# Patient Record
Sex: Male | Born: 1945 | Race: White | Hispanic: No | Marital: Married | Smoking: Former smoker
Health system: Southern US, Community
[De-identification: ages and names within clinical notes are randomized; demographics above are authoritative.]

## PROBLEM LIST (undated history)

## (undated) DIAGNOSIS — L408 Other psoriasis: Secondary | ICD-10-CM

## (undated) DIAGNOSIS — E1149 Type 2 diabetes mellitus with other diabetic neurological complication: Secondary | ICD-10-CM

## (undated) DIAGNOSIS — I2510 Atherosclerotic heart disease of native coronary artery without angina pectoris: Secondary | ICD-10-CM

## (undated) DIAGNOSIS — Z86010 Personal history of colonic polyps: Secondary | ICD-10-CM

## (undated) DIAGNOSIS — I10 Essential (primary) hypertension: Secondary | ICD-10-CM

## (undated) DIAGNOSIS — E119 Type 2 diabetes mellitus without complications: Secondary | ICD-10-CM

## (undated) DIAGNOSIS — K3189 Other diseases of stomach and duodenum: Secondary | ICD-10-CM

## (undated) DIAGNOSIS — E669 Obesity, unspecified: Secondary | ICD-10-CM

## (undated) DIAGNOSIS — I4891 Unspecified atrial fibrillation: Secondary | ICD-10-CM

## (undated) DIAGNOSIS — E785 Hyperlipidemia, unspecified: Secondary | ICD-10-CM

## (undated) DIAGNOSIS — N189 Chronic kidney disease, unspecified: Secondary | ICD-10-CM

## (undated) DIAGNOSIS — J449 Chronic obstructive pulmonary disease, unspecified: Secondary | ICD-10-CM

## (undated) DIAGNOSIS — R1013 Epigastric pain: Secondary | ICD-10-CM

## (undated) DIAGNOSIS — F1021 Alcohol dependence, in remission: Secondary | ICD-10-CM

## (undated) DIAGNOSIS — I5022 Chronic systolic (congestive) heart failure: Secondary | ICD-10-CM

## (undated) DIAGNOSIS — I4892 Unspecified atrial flutter: Secondary | ICD-10-CM

## (undated) DIAGNOSIS — H6060 Unspecified chronic otitis externa, unspecified ear: Secondary | ICD-10-CM

## (undated) HISTORY — PX: HERNIA REPAIR: SHX51

## (undated) HISTORY — DX: Unspecified chronic otitis externa, unspecified ear: H60.60

## (undated) HISTORY — DX: Epigastric pain: R10.13

## (undated) HISTORY — DX: Type 2 diabetes mellitus with other diabetic neurological complication: E11.49

## (undated) HISTORY — PX: ANGIOPLASTY: SHX39

## (undated) HISTORY — DX: Chronic systolic (congestive) heart failure: I50.22

## (undated) HISTORY — DX: Other psoriasis: L40.8

## (undated) HISTORY — PX: CARDIAC DEFIBRILLATOR PLACEMENT: SHX171

## (undated) HISTORY — DX: Type 2 diabetes mellitus without complications: E11.9

## (undated) HISTORY — DX: Other diseases of stomach and duodenum: K31.89

## (undated) HISTORY — DX: Hyperlipidemia, unspecified: E78.5

## (undated) HISTORY — PX: CORONARY ANGIOPLASTY: SHX604

## (undated) HISTORY — DX: Atherosclerotic heart disease of native coronary artery without angina pectoris: I25.10

## (undated) HISTORY — DX: Essential (primary) hypertension: I10

## (undated) HISTORY — DX: Obesity, unspecified: E66.9

## (undated) HISTORY — PX: TRANSTHORACIC ECHOCARDIOGRAM: SHX275

## (undated) HISTORY — DX: Chronic obstructive pulmonary disease, unspecified: J44.9

## (undated) HISTORY — DX: Chronic kidney disease, unspecified: N18.9

## (undated) HISTORY — DX: Unspecified atrial flutter: I48.92

## (undated) HISTORY — DX: Alcohol dependence, in remission: F10.21

## (undated) HISTORY — DX: Personal history of colonic polyps: Z86.010

## (undated) HISTORY — DX: Unspecified atrial fibrillation: I48.91

## (undated) HISTORY — PX: CARDIAC CATHETERIZATION: SHX172

---

## 1997-10-17 ENCOUNTER — Other Ambulatory Visit: Admission: RE | Admit: 1997-10-17 | Discharge: 1997-10-17 | Payer: Self-pay | Admitting: Orthopedic Surgery

## 2000-08-26 ENCOUNTER — Encounter: Payer: Self-pay | Admitting: *Deleted

## 2000-08-26 ENCOUNTER — Ambulatory Visit (HOSPITAL_COMMUNITY): Admission: RE | Admit: 2000-08-26 | Discharge: 2000-08-26 | Payer: Self-pay | Admitting: *Deleted

## 2003-10-16 ENCOUNTER — Inpatient Hospital Stay (HOSPITAL_COMMUNITY): Admission: AD | Admit: 2003-10-16 | Discharge: 2003-10-22 | Payer: Self-pay | Admitting: Internal Medicine

## 2003-10-17 ENCOUNTER — Encounter: Payer: Self-pay | Admitting: Cardiology

## 2004-01-25 ENCOUNTER — Ambulatory Visit: Payer: Self-pay | Admitting: Cardiology

## 2004-02-08 ENCOUNTER — Ambulatory Visit: Payer: Self-pay | Admitting: Cardiology

## 2004-02-22 ENCOUNTER — Ambulatory Visit: Payer: Self-pay | Admitting: Internal Medicine

## 2004-02-29 ENCOUNTER — Ambulatory Visit: Payer: Self-pay | Admitting: Internal Medicine

## 2004-03-28 ENCOUNTER — Ambulatory Visit: Payer: Self-pay | Admitting: Cardiology

## 2004-04-18 ENCOUNTER — Ambulatory Visit: Payer: Self-pay | Admitting: Internal Medicine

## 2004-05-16 ENCOUNTER — Ambulatory Visit: Payer: Self-pay | Admitting: Cardiovascular Disease

## 2004-05-23 ENCOUNTER — Ambulatory Visit: Payer: Self-pay | Admitting: Cardiology

## 2004-06-06 ENCOUNTER — Ambulatory Visit: Payer: Self-pay | Admitting: Internal Medicine

## 2004-06-25 ENCOUNTER — Ambulatory Visit: Payer: Self-pay | Admitting: *Deleted

## 2004-07-09 ENCOUNTER — Ambulatory Visit: Payer: Self-pay | Admitting: Internal Medicine

## 2004-07-30 ENCOUNTER — Ambulatory Visit: Payer: Self-pay | Admitting: Cardiology

## 2004-08-05 ENCOUNTER — Ambulatory Visit: Payer: Self-pay | Admitting: Cardiology

## 2004-08-20 ENCOUNTER — Ambulatory Visit: Payer: Self-pay | Admitting: Cardiology

## 2004-08-27 ENCOUNTER — Ambulatory Visit: Payer: Self-pay | Admitting: Cardiology

## 2004-08-29 ENCOUNTER — Ambulatory Visit: Payer: Self-pay | Admitting: Internal Medicine

## 2004-09-12 ENCOUNTER — Ambulatory Visit: Payer: Self-pay | Admitting: Cardiology

## 2004-09-19 ENCOUNTER — Ambulatory Visit: Payer: Self-pay | Admitting: Cardiology

## 2004-10-07 ENCOUNTER — Ambulatory Visit: Payer: Self-pay | Admitting: Cardiology

## 2004-10-28 ENCOUNTER — Ambulatory Visit: Payer: Self-pay | Admitting: Cardiology

## 2005-02-25 ENCOUNTER — Ambulatory Visit: Payer: Self-pay | Admitting: Internal Medicine

## 2005-03-09 ENCOUNTER — Inpatient Hospital Stay (HOSPITAL_COMMUNITY): Admission: EM | Admit: 2005-03-09 | Discharge: 2005-03-15 | Payer: Self-pay | Admitting: Emergency Medicine

## 2005-03-09 ENCOUNTER — Ambulatory Visit: Payer: Self-pay | Admitting: *Deleted

## 2005-03-10 ENCOUNTER — Encounter: Payer: Self-pay | Admitting: Cardiology

## 2005-03-12 ENCOUNTER — Encounter: Payer: Self-pay | Admitting: Internal Medicine

## 2005-03-18 ENCOUNTER — Ambulatory Visit: Payer: Self-pay | Admitting: Cardiovascular Disease

## 2005-03-25 ENCOUNTER — Ambulatory Visit: Payer: Self-pay | Admitting: Internal Medicine

## 2005-03-26 ENCOUNTER — Ambulatory Visit: Payer: Self-pay | Admitting: Cardiology

## 2005-04-03 ENCOUNTER — Ambulatory Visit: Payer: Self-pay | Admitting: Internal Medicine

## 2005-04-03 ENCOUNTER — Ambulatory Visit: Payer: Self-pay

## 2005-04-10 ENCOUNTER — Ambulatory Visit: Payer: Self-pay | Admitting: Internal Medicine

## 2005-04-17 ENCOUNTER — Ambulatory Visit: Payer: Self-pay | Admitting: Cardiology

## 2005-04-24 ENCOUNTER — Ambulatory Visit: Payer: Self-pay | Admitting: Cardiology

## 2005-05-01 ENCOUNTER — Ambulatory Visit: Payer: Self-pay | Admitting: Cardiovascular Disease

## 2005-05-08 ENCOUNTER — Ambulatory Visit: Payer: Self-pay | Admitting: Internal Medicine

## 2005-05-15 ENCOUNTER — Ambulatory Visit: Payer: Self-pay | Admitting: Cardiology

## 2005-05-15 ENCOUNTER — Ambulatory Visit: Payer: Self-pay | Admitting: Internal Medicine

## 2005-05-22 ENCOUNTER — Ambulatory Visit: Payer: Self-pay | Admitting: Cardiology

## 2005-05-25 ENCOUNTER — Ambulatory Visit: Payer: Self-pay | Admitting: Internal Medicine

## 2005-05-25 ENCOUNTER — Ambulatory Visit: Payer: Self-pay | Admitting: Cardiology

## 2005-06-02 ENCOUNTER — Ambulatory Visit: Payer: Self-pay | Admitting: Cardiology

## 2005-06-09 ENCOUNTER — Ambulatory Visit: Payer: Self-pay | Admitting: Cardiovascular Disease

## 2005-06-12 ENCOUNTER — Inpatient Hospital Stay (HOSPITAL_COMMUNITY): Admission: EM | Admit: 2005-06-12 | Discharge: 2005-06-16 | Payer: Self-pay | Admitting: Emergency Medicine

## 2005-06-12 ENCOUNTER — Ambulatory Visit: Payer: Self-pay | Admitting: Internal Medicine

## 2005-06-22 ENCOUNTER — Ambulatory Visit: Payer: Self-pay | Admitting: Cardiology

## 2005-06-23 ENCOUNTER — Ambulatory Visit: Payer: Self-pay | Admitting: Internal Medicine

## 2005-06-25 ENCOUNTER — Ambulatory Visit: Payer: Self-pay | Admitting: Internal Medicine

## 2005-06-30 ENCOUNTER — Ambulatory Visit: Payer: Self-pay

## 2005-07-08 ENCOUNTER — Ambulatory Visit: Payer: Self-pay | Admitting: *Deleted

## 2005-07-09 ENCOUNTER — Ambulatory Visit: Payer: Self-pay | Admitting: Internal Medicine

## 2005-07-14 ENCOUNTER — Ambulatory Visit: Payer: Self-pay | Admitting: Cardiovascular Disease

## 2005-07-16 ENCOUNTER — Ambulatory Visit: Payer: Self-pay | Admitting: Internal Medicine

## 2005-07-21 ENCOUNTER — Ambulatory Visit: Payer: Self-pay | Admitting: Cardiology

## 2005-07-24 ENCOUNTER — Ambulatory Visit: Payer: Self-pay | Admitting: Internal Medicine

## 2005-07-28 ENCOUNTER — Ambulatory Visit: Payer: Self-pay | Admitting: Cardiology

## 2005-08-04 ENCOUNTER — Ambulatory Visit: Payer: Self-pay | Admitting: Internal Medicine

## 2005-08-04 ENCOUNTER — Ambulatory Visit: Payer: Self-pay | Admitting: Cardiology

## 2005-08-05 ENCOUNTER — Ambulatory Visit (HOSPITAL_COMMUNITY): Admission: RE | Admit: 2005-08-05 | Discharge: 2005-08-06 | Payer: Self-pay | Admitting: Internal Medicine

## 2005-08-05 ENCOUNTER — Ambulatory Visit: Payer: Self-pay | Admitting: Internal Medicine

## 2005-08-25 ENCOUNTER — Ambulatory Visit: Payer: Self-pay | Admitting: Internal Medicine

## 2005-08-27 ENCOUNTER — Ambulatory Visit: Payer: Self-pay | Admitting: Internal Medicine

## 2005-11-16 ENCOUNTER — Ambulatory Visit: Payer: Self-pay | Admitting: Internal Medicine

## 2005-11-25 ENCOUNTER — Ambulatory Visit: Payer: Self-pay | Admitting: Internal Medicine

## 2006-03-26 ENCOUNTER — Ambulatory Visit: Payer: Self-pay | Admitting: Internal Medicine

## 2006-03-26 LAB — CONVERTED CEMR LAB
BUN: 24 mg/dL — ABNORMAL HIGH (ref 6–23)
Calcium: 8.9 mg/dL (ref 8.4–10.5)
Chloride: 98 meq/L (ref 96–112)
Hgb A1c MFr Bld: 12.7 % — ABNORMAL HIGH (ref 4.6–6.0)

## 2006-04-28 ENCOUNTER — Ambulatory Visit: Payer: Self-pay | Admitting: Internal Medicine

## 2006-05-27 ENCOUNTER — Ambulatory Visit: Payer: Self-pay | Admitting: Internal Medicine

## 2006-09-20 DIAGNOSIS — I2510 Atherosclerotic heart disease of native coronary artery without angina pectoris: Secondary | ICD-10-CM | POA: Insufficient documentation

## 2006-09-20 DIAGNOSIS — E119 Type 2 diabetes mellitus without complications: Secondary | ICD-10-CM

## 2006-09-20 DIAGNOSIS — I5022 Chronic systolic (congestive) heart failure: Secondary | ICD-10-CM

## 2006-09-20 DIAGNOSIS — E785 Hyperlipidemia, unspecified: Secondary | ICD-10-CM

## 2006-09-20 DIAGNOSIS — I4892 Unspecified atrial flutter: Secondary | ICD-10-CM

## 2006-09-20 DIAGNOSIS — I5032 Chronic diastolic (congestive) heart failure: Secondary | ICD-10-CM

## 2006-09-20 HISTORY — DX: Type 2 diabetes mellitus without complications: E11.9

## 2006-09-20 HISTORY — DX: Chronic systolic (congestive) heart failure: I50.22

## 2006-09-20 HISTORY — DX: Unspecified atrial flutter: I48.92

## 2006-09-20 HISTORY — DX: Hyperlipidemia, unspecified: E78.5

## 2006-09-20 HISTORY — DX: Atherosclerotic heart disease of native coronary artery without angina pectoris: I25.10

## 2006-09-30 ENCOUNTER — Encounter: Payer: Self-pay | Admitting: Internal Medicine

## 2006-09-30 ENCOUNTER — Ambulatory Visit: Payer: Self-pay | Admitting: Internal Medicine

## 2006-09-30 LAB — CONVERTED CEMR LAB
CO2: 31 meq/L (ref 19–32)
Calcium: 9.3 mg/dL (ref 8.4–10.5)
Chloride: 98 meq/L (ref 96–112)
Creatinine, Ser: 1.3 mg/dL (ref 0.4–1.5)
GFR calc non Af Amer: 60 mL/min
Glucose, Bld: 204 mg/dL — ABNORMAL HIGH (ref 70–99)

## 2006-10-09 ENCOUNTER — Ambulatory Visit: Payer: Self-pay | Admitting: *Deleted

## 2006-10-09 ENCOUNTER — Ambulatory Visit: Payer: Self-pay | Admitting: Internal Medicine

## 2006-10-09 ENCOUNTER — Ambulatory Visit: Payer: Self-pay | Admitting: Cardiology

## 2006-10-09 ENCOUNTER — Inpatient Hospital Stay (HOSPITAL_COMMUNITY): Admission: EM | Admit: 2006-10-09 | Discharge: 2006-10-15 | Payer: Self-pay | Admitting: Emergency Medicine

## 2006-10-12 ENCOUNTER — Encounter: Payer: Self-pay | Admitting: Internal Medicine

## 2006-10-18 ENCOUNTER — Ambulatory Visit: Payer: Self-pay | Admitting: Internal Medicine

## 2006-10-18 LAB — CONVERTED CEMR LAB
BUN: 33 mg/dL — ABNORMAL HIGH (ref 6–23)
Calcium: 9.3 mg/dL (ref 8.4–10.5)
Chloride: 98 meq/L (ref 96–112)
Creatinine, Ser: 1.3 mg/dL (ref 0.4–1.5)
GFR calc Af Amer: 72 mL/min
Potassium: 4 meq/L (ref 3.5–5.1)
Sodium: 138 meq/L (ref 135–145)

## 2006-10-22 ENCOUNTER — Telehealth: Payer: Self-pay | Admitting: Internal Medicine

## 2006-10-25 ENCOUNTER — Ambulatory Visit: Payer: Self-pay | Admitting: Internal Medicine

## 2006-10-27 ENCOUNTER — Ambulatory Visit: Payer: Self-pay | Admitting: Cardiology

## 2006-10-27 ENCOUNTER — Ambulatory Visit: Payer: Self-pay

## 2006-11-05 ENCOUNTER — Ambulatory Visit: Payer: Self-pay | Admitting: Cardiology

## 2006-11-05 LAB — CONVERTED CEMR LAB
BUN: 19 mg/dL (ref 6–23)
Calcium: 9 mg/dL (ref 8.4–10.5)
GFR calc Af Amer: 72 mL/min
GFR calc non Af Amer: 60 mL/min
Potassium: 3.9 meq/L (ref 3.5–5.1)
Pro B Natriuretic peptide (BNP): 503 pg/mL — ABNORMAL HIGH (ref 0.0–100.0)

## 2006-11-11 ENCOUNTER — Ambulatory Visit: Payer: Self-pay | Admitting: Internal Medicine

## 2006-11-11 LAB — CONVERTED CEMR LAB: Blood Glucose, Fingerstick: 274

## 2006-11-12 ENCOUNTER — Ambulatory Visit: Payer: Self-pay | Admitting: Cardiovascular Disease

## 2006-11-19 ENCOUNTER — Ambulatory Visit: Payer: Self-pay | Admitting: Internal Medicine

## 2006-11-24 ENCOUNTER — Ambulatory Visit: Payer: Self-pay | Admitting: Internal Medicine

## 2006-11-25 ENCOUNTER — Ambulatory Visit: Payer: Self-pay | Admitting: Internal Medicine

## 2006-12-02 ENCOUNTER — Ambulatory Visit: Payer: Self-pay | Admitting: Internal Medicine

## 2006-12-02 ENCOUNTER — Ambulatory Visit: Payer: Self-pay | Admitting: Cardiology

## 2006-12-02 LAB — CONVERTED CEMR LAB
BUN: 27 mg/dL — ABNORMAL HIGH (ref 6–23)
Basophils Absolute: 0 10*3/uL (ref 0.0–0.1)
Calcium: 8.9 mg/dL (ref 8.4–10.5)
Chloride: 98 meq/L (ref 96–112)
Eosinophils Absolute: 0.3 10*3/uL (ref 0.0–0.6)
GFR calc non Af Amer: 60 mL/min
INR: 2.4 — ABNORMAL HIGH (ref 0.8–1.0)
MCHC: 34.3 g/dL (ref 30.0–36.0)
MCV: 87.4 fL (ref 78.0–100.0)
Magnesium: 2.1 mg/dL (ref 1.5–2.5)
Monocytes Relative: 11.9 % — ABNORMAL HIGH (ref 3.0–11.0)
Neutrophils Relative %: 69.1 % (ref 43.0–77.0)
Platelets: 226 10*3/uL (ref 150–400)
RBC: 5.01 M/uL (ref 4.22–5.81)
Sodium: 136 meq/L (ref 135–145)
aPTT: 48 s — ABNORMAL HIGH (ref 21.7–29.8)

## 2006-12-09 ENCOUNTER — Ambulatory Visit: Payer: Self-pay | Admitting: Internal Medicine

## 2006-12-10 ENCOUNTER — Ambulatory Visit: Payer: Self-pay | Admitting: Internal Medicine

## 2006-12-10 ENCOUNTER — Ambulatory Visit (HOSPITAL_COMMUNITY): Admission: RE | Admit: 2006-12-10 | Discharge: 2006-12-10 | Payer: Self-pay | Admitting: Internal Medicine

## 2006-12-16 ENCOUNTER — Ambulatory Visit: Payer: Self-pay | Admitting: Cardiology

## 2006-12-16 ENCOUNTER — Ambulatory Visit: Payer: Self-pay | Admitting: Internal Medicine

## 2006-12-17 ENCOUNTER — Ambulatory Visit: Payer: Self-pay | Admitting: Internal Medicine

## 2006-12-17 DIAGNOSIS — J4489 Other specified chronic obstructive pulmonary disease: Secondary | ICD-10-CM

## 2006-12-17 DIAGNOSIS — I4891 Unspecified atrial fibrillation: Secondary | ICD-10-CM | POA: Insufficient documentation

## 2006-12-17 DIAGNOSIS — J449 Chronic obstructive pulmonary disease, unspecified: Secondary | ICD-10-CM

## 2006-12-17 DIAGNOSIS — I10 Essential (primary) hypertension: Secondary | ICD-10-CM

## 2006-12-17 HISTORY — DX: Unspecified atrial fibrillation: I48.91

## 2006-12-17 HISTORY — DX: Essential (primary) hypertension: I10

## 2006-12-17 HISTORY — DX: Chronic obstructive pulmonary disease, unspecified: J44.9

## 2006-12-17 HISTORY — DX: Other specified chronic obstructive pulmonary disease: J44.89

## 2006-12-23 ENCOUNTER — Ambulatory Visit: Payer: Self-pay | Admitting: Cardiology

## 2006-12-28 ENCOUNTER — Ambulatory Visit: Payer: Self-pay | Admitting: Internal Medicine

## 2006-12-28 DIAGNOSIS — H6060 Unspecified chronic otitis externa, unspecified ear: Secondary | ICD-10-CM

## 2006-12-28 DIAGNOSIS — H608X9 Other otitis externa, unspecified ear: Secondary | ICD-10-CM

## 2006-12-28 HISTORY — DX: Other otitis externa, unspecified ear: H60.8X9

## 2006-12-28 LAB — CONVERTED CEMR LAB: Blood Glucose, Fingerstick: 293

## 2006-12-30 ENCOUNTER — Ambulatory Visit: Payer: Self-pay | Admitting: Cardiology

## 2007-01-05 ENCOUNTER — Ambulatory Visit: Payer: Self-pay | Admitting: Cardiology

## 2007-01-05 LAB — CONVERTED CEMR LAB
CO2: 34 meq/L — ABNORMAL HIGH (ref 19–32)
Calcium: 9.2 mg/dL (ref 8.4–10.5)
GFR calc Af Amer: 61 mL/min
Glucose, Bld: 141 mg/dL — ABNORMAL HIGH (ref 70–99)

## 2007-01-11 ENCOUNTER — Ambulatory Visit: Payer: Self-pay | Admitting: Cardiology

## 2007-01-11 ENCOUNTER — Ambulatory Visit: Payer: Self-pay | Admitting: Internal Medicine

## 2007-01-13 ENCOUNTER — Ambulatory Visit: Payer: Self-pay | Admitting: Cardiovascular Disease

## 2007-01-18 ENCOUNTER — Ambulatory Visit: Payer: Self-pay | Admitting: Cardiology

## 2007-02-01 ENCOUNTER — Ambulatory Visit: Payer: Self-pay | Admitting: Cardiovascular Disease

## 2007-03-01 ENCOUNTER — Ambulatory Visit: Payer: Self-pay | Admitting: Cardiology

## 2007-03-02 ENCOUNTER — Ambulatory Visit: Payer: Self-pay | Admitting: Internal Medicine

## 2007-03-11 ENCOUNTER — Ambulatory Visit: Payer: Self-pay | Admitting: Cardiology

## 2007-04-06 ENCOUNTER — Emergency Department (HOSPITAL_COMMUNITY): Admission: EM | Admit: 2007-04-06 | Discharge: 2007-04-06 | Payer: Self-pay | Admitting: Emergency Medicine

## 2007-04-14 ENCOUNTER — Encounter: Payer: Self-pay | Admitting: Internal Medicine

## 2007-04-14 ENCOUNTER — Ambulatory Visit: Payer: Self-pay | Admitting: Cardiology

## 2007-04-16 ENCOUNTER — Ambulatory Visit (HOSPITAL_COMMUNITY): Admission: RE | Admit: 2007-04-16 | Discharge: 2007-04-16 | Payer: Self-pay | Admitting: Orthopedic Surgery

## 2007-04-24 ENCOUNTER — Encounter: Admission: RE | Admit: 2007-04-24 | Discharge: 2007-04-24 | Payer: Self-pay | Admitting: Orthopedic Surgery

## 2007-04-29 ENCOUNTER — Ambulatory Visit: Payer: Self-pay | Admitting: Cardiology

## 2007-05-02 ENCOUNTER — Ambulatory Visit: Payer: Self-pay | Admitting: Internal Medicine

## 2007-05-24 ENCOUNTER — Encounter: Admission: RE | Admit: 2007-05-24 | Discharge: 2007-07-13 | Payer: Self-pay | Admitting: Orthopedic Surgery

## 2007-06-09 ENCOUNTER — Ambulatory Visit: Payer: Self-pay | Admitting: Cardiology

## 2007-06-20 ENCOUNTER — Ambulatory Visit: Payer: Self-pay | Admitting: Cardiology

## 2007-07-07 ENCOUNTER — Ambulatory Visit: Payer: Self-pay | Admitting: Cardiology

## 2007-07-18 ENCOUNTER — Ambulatory Visit: Payer: Self-pay | Admitting: Cardiovascular Disease

## 2007-07-21 ENCOUNTER — Ambulatory Visit: Payer: Self-pay | Admitting: Internal Medicine

## 2007-07-25 LAB — CONVERTED CEMR LAB: Hgb A1c MFr Bld: 17.4 % — ABNORMAL HIGH (ref 4.6–6.0)

## 2007-08-08 ENCOUNTER — Telehealth: Payer: Self-pay | Admitting: Internal Medicine

## 2007-08-09 ENCOUNTER — Encounter: Payer: Self-pay | Admitting: Internal Medicine

## 2007-08-19 ENCOUNTER — Ambulatory Visit: Payer: Self-pay | Admitting: Internal Medicine

## 2007-08-19 DIAGNOSIS — M79609 Pain in unspecified limb: Secondary | ICD-10-CM | POA: Insufficient documentation

## 2007-08-19 LAB — CONVERTED CEMR LAB: Prothrombin Time: 15.1 s

## 2007-08-22 LAB — CONVERTED CEMR LAB: Hgb A1c MFr Bld: 14.7 % — ABNORMAL HIGH (ref 4.6–6.0)

## 2007-08-29 ENCOUNTER — Ambulatory Visit: Payer: Self-pay | Admitting: Internal Medicine

## 2007-11-01 ENCOUNTER — Telehealth: Payer: Self-pay | Admitting: Family Medicine

## 2007-11-07 ENCOUNTER — Ambulatory Visit: Payer: Self-pay | Admitting: Internal Medicine

## 2007-11-07 LAB — CONVERTED CEMR LAB: Blood Glucose, Fingerstick: 144

## 2007-11-29 ENCOUNTER — Ambulatory Visit: Payer: Self-pay | Admitting: Internal Medicine

## 2007-12-28 ENCOUNTER — Ambulatory Visit: Payer: Self-pay | Admitting: Internal Medicine

## 2008-01-11 ENCOUNTER — Ambulatory Visit: Payer: Self-pay | Admitting: Internal Medicine

## 2008-01-24 ENCOUNTER — Ambulatory Visit: Payer: Self-pay | Admitting: Internal Medicine

## 2008-02-23 ENCOUNTER — Encounter: Payer: Self-pay | Admitting: Internal Medicine

## 2008-02-27 ENCOUNTER — Encounter: Payer: Self-pay | Admitting: Internal Medicine

## 2008-02-28 ENCOUNTER — Telehealth: Payer: Self-pay | Admitting: Internal Medicine

## 2008-02-29 ENCOUNTER — Ambulatory Visit: Payer: Self-pay | Admitting: Internal Medicine

## 2008-03-19 ENCOUNTER — Ambulatory Visit: Payer: Self-pay | Admitting: Cardiology

## 2008-04-02 ENCOUNTER — Ambulatory Visit: Payer: Self-pay | Admitting: Cardiology

## 2008-04-30 DIAGNOSIS — L408 Other psoriasis: Secondary | ICD-10-CM

## 2008-04-30 DIAGNOSIS — F1021 Alcohol dependence, in remission: Secondary | ICD-10-CM

## 2008-04-30 DIAGNOSIS — E669 Obesity, unspecified: Secondary | ICD-10-CM

## 2008-04-30 DIAGNOSIS — N189 Chronic kidney disease, unspecified: Secondary | ICD-10-CM

## 2008-04-30 DIAGNOSIS — N259 Disorder resulting from impaired renal tubular function, unspecified: Secondary | ICD-10-CM | POA: Insufficient documentation

## 2008-04-30 HISTORY — DX: Alcohol dependence, in remission: F10.21

## 2008-04-30 HISTORY — DX: Chronic kidney disease, unspecified: N18.9

## 2008-04-30 HISTORY — DX: Obesity, unspecified: E66.9

## 2008-04-30 HISTORY — DX: Other psoriasis: L40.8

## 2008-05-02 ENCOUNTER — Emergency Department (HOSPITAL_COMMUNITY): Admission: EM | Admit: 2008-05-02 | Discharge: 2008-05-02 | Payer: Self-pay | Admitting: Family Medicine

## 2008-05-10 ENCOUNTER — Ambulatory Visit: Payer: Self-pay | Admitting: Internal Medicine

## 2008-05-10 DIAGNOSIS — M25519 Pain in unspecified shoulder: Secondary | ICD-10-CM | POA: Insufficient documentation

## 2008-05-30 ENCOUNTER — Encounter (INDEPENDENT_AMBULATORY_CARE_PROVIDER_SITE_OTHER): Payer: Self-pay | Admitting: *Deleted

## 2008-05-30 ENCOUNTER — Ambulatory Visit: Payer: Self-pay | Admitting: Internal Medicine

## 2008-05-30 LAB — CONVERTED CEMR LAB: Blood Glucose, Fingerstick: 353

## 2008-06-05 ENCOUNTER — Ambulatory Visit: Payer: Self-pay | Admitting: Internal Medicine

## 2008-06-08 ENCOUNTER — Telehealth: Payer: Self-pay | Admitting: Internal Medicine

## 2008-06-08 ENCOUNTER — Ambulatory Visit: Payer: Self-pay | Admitting: Internal Medicine

## 2008-06-12 ENCOUNTER — Telehealth: Payer: Self-pay | Admitting: Internal Medicine

## 2008-06-14 ENCOUNTER — Ambulatory Visit: Payer: Self-pay | Admitting: Internal Medicine

## 2008-06-14 LAB — CONVERTED CEMR LAB
BUN: 24 mg/dL — ABNORMAL HIGH (ref 6–23)
CO2: 32 meq/L (ref 19–32)
Chloride: 99 meq/L (ref 96–112)
Creatinine, Ser: 1.7 mg/dL — ABNORMAL HIGH (ref 0.4–1.5)

## 2008-06-25 ENCOUNTER — Telehealth (INDEPENDENT_AMBULATORY_CARE_PROVIDER_SITE_OTHER): Payer: Self-pay

## 2008-06-26 ENCOUNTER — Ambulatory Visit: Payer: Self-pay | Admitting: Cardiovascular Disease

## 2008-06-26 LAB — CONVERTED CEMR LAB
INR: 9.6 (ref 0.8–1.0)
Prothrombin Time: 91.8 s (ref 10.9–13.3)

## 2008-06-29 ENCOUNTER — Ambulatory Visit: Payer: Self-pay | Admitting: Cardiology

## 2008-07-04 ENCOUNTER — Telehealth: Payer: Self-pay | Admitting: Internal Medicine

## 2008-07-11 ENCOUNTER — Ambulatory Visit: Payer: Self-pay | Admitting: Internal Medicine

## 2008-07-16 ENCOUNTER — Telehealth: Payer: Self-pay | Admitting: Internal Medicine

## 2008-07-16 ENCOUNTER — Ambulatory Visit: Payer: Self-pay | Admitting: Internal Medicine

## 2008-07-16 LAB — CONVERTED CEMR LAB: Blood Glucose, Fingerstick: 251

## 2008-07-19 ENCOUNTER — Encounter: Payer: Self-pay | Admitting: Internal Medicine

## 2008-08-01 ENCOUNTER — Ambulatory Visit: Payer: Self-pay | Admitting: Cardiology

## 2008-08-03 ENCOUNTER — Encounter (INDEPENDENT_AMBULATORY_CARE_PROVIDER_SITE_OTHER): Payer: Self-pay | Admitting: *Deleted

## 2008-08-21 ENCOUNTER — Encounter: Payer: Self-pay | Admitting: *Deleted

## 2008-09-02 ENCOUNTER — Encounter: Payer: Self-pay | Admitting: Internal Medicine

## 2008-09-12 ENCOUNTER — Ambulatory Visit: Payer: Self-pay | Admitting: Internal Medicine

## 2008-09-12 LAB — CONVERTED CEMR LAB: POC INR: 1.8

## 2008-09-13 ENCOUNTER — Telehealth: Payer: Self-pay | Admitting: Internal Medicine

## 2008-09-26 ENCOUNTER — Encounter: Payer: Self-pay | Admitting: *Deleted

## 2008-10-02 ENCOUNTER — Ambulatory Visit: Payer: Self-pay | Admitting: Cardiology

## 2008-10-02 LAB — CONVERTED CEMR LAB
POC INR: 1.5
Prothrombin Time: 14.9 s

## 2008-10-17 ENCOUNTER — Ambulatory Visit: Payer: Self-pay | Admitting: Cardiology

## 2008-11-05 ENCOUNTER — Telehealth: Payer: Self-pay | Admitting: Internal Medicine

## 2008-11-07 ENCOUNTER — Ambulatory Visit: Payer: Self-pay | Admitting: Internal Medicine

## 2008-11-07 ENCOUNTER — Ambulatory Visit: Payer: Self-pay | Admitting: Cardiology

## 2008-11-07 LAB — CONVERTED CEMR LAB
BUN: 30 mg/dL — ABNORMAL HIGH (ref 6–23)
Chloride: 101 meq/L (ref 96–112)
Glucose, Bld: 343 mg/dL — ABNORMAL HIGH (ref 70–99)
Potassium: 3.9 meq/L (ref 3.5–5.1)

## 2008-11-14 ENCOUNTER — Ambulatory Visit: Payer: Self-pay | Admitting: Internal Medicine

## 2008-12-07 ENCOUNTER — Encounter (INDEPENDENT_AMBULATORY_CARE_PROVIDER_SITE_OTHER): Payer: Self-pay | Admitting: *Deleted

## 2008-12-19 ENCOUNTER — Encounter (INDEPENDENT_AMBULATORY_CARE_PROVIDER_SITE_OTHER): Payer: Self-pay | Admitting: *Deleted

## 2009-01-04 ENCOUNTER — Ambulatory Visit: Payer: Self-pay | Admitting: Internal Medicine

## 2009-01-04 LAB — CONVERTED CEMR LAB: POC INR: 1.3

## 2009-01-11 ENCOUNTER — Ambulatory Visit: Payer: Self-pay | Admitting: Cardiology

## 2009-01-18 ENCOUNTER — Ambulatory Visit: Payer: Self-pay | Admitting: Internal Medicine

## 2009-02-04 ENCOUNTER — Ambulatory Visit: Payer: Self-pay | Admitting: Cardiology

## 2009-02-04 LAB — CONVERTED CEMR LAB: INR: 1.8

## 2009-02-18 ENCOUNTER — Ambulatory Visit: Payer: Self-pay | Admitting: Cardiology

## 2009-02-18 LAB — CONVERTED CEMR LAB: POC INR: 2.2

## 2009-02-19 ENCOUNTER — Ambulatory Visit: Payer: Self-pay | Admitting: Internal Medicine

## 2009-02-19 DIAGNOSIS — L03019 Cellulitis of unspecified finger: Secondary | ICD-10-CM

## 2009-02-19 LAB — CONVERTED CEMR LAB
BUN: 24 mg/dL — ABNORMAL HIGH (ref 6–23)
GFR calc non Af Amer: 40.7 mL/min (ref >60)
Hgb A1c MFr Bld: 8.2 % — ABNORMAL HIGH (ref 4.6–6.5)
Potassium: 4.6 meq/L (ref 3.5–5.1)
Sodium: 141 meq/L (ref 135–145)

## 2009-03-11 ENCOUNTER — Encounter (INDEPENDENT_AMBULATORY_CARE_PROVIDER_SITE_OTHER): Payer: Self-pay | Admitting: *Deleted

## 2009-03-26 ENCOUNTER — Ambulatory Visit: Payer: Self-pay | Admitting: Cardiology

## 2009-04-04 ENCOUNTER — Ambulatory Visit: Payer: Self-pay | Admitting: Gastroenterology

## 2009-04-04 DIAGNOSIS — D68318 Other hemorrhagic disorder due to intrinsic circulating anticoagulants, antibodies, or inhibitors: Secondary | ICD-10-CM | POA: Insufficient documentation

## 2009-04-04 DIAGNOSIS — K3189 Other diseases of stomach and duodenum: Secondary | ICD-10-CM

## 2009-04-04 DIAGNOSIS — R1013 Epigastric pain: Secondary | ICD-10-CM

## 2009-04-04 HISTORY — DX: Other diseases of stomach and duodenum: K31.89

## 2009-04-16 ENCOUNTER — Ambulatory Visit: Payer: Self-pay | Admitting: Cardiovascular Disease

## 2009-04-19 ENCOUNTER — Telehealth: Payer: Self-pay | Admitting: Gastroenterology

## 2009-04-22 ENCOUNTER — Encounter: Payer: Self-pay | Admitting: Gastroenterology

## 2009-04-23 ENCOUNTER — Telehealth: Payer: Self-pay | Admitting: Gastroenterology

## 2009-04-23 ENCOUNTER — Ambulatory Visit: Payer: Self-pay | Admitting: Internal Medicine

## 2009-04-23 LAB — CONVERTED CEMR LAB
AST: 28 units/L (ref 0–37)
Alkaline Phosphatase: 70 units/L (ref 39–117)
Basophils Absolute: 0 10*3/uL (ref 0.0–0.1)
Bilirubin, Direct: 0.3 mg/dL (ref 0.0–0.3)
Calcium: 8.8 mg/dL (ref 8.4–10.5)
Creatinine, Ser: 1.9 mg/dL — ABNORMAL HIGH (ref 0.4–1.5)
GFR calc non Af Amer: 38.22 mL/min (ref >60)
Glucose, Bld: 192 mg/dL — ABNORMAL HIGH (ref 70–99)
Hemoglobin: 11.8 g/dL — ABNORMAL LOW (ref 13.0–17.0)
Lymphocytes Relative: 10.4 % — ABNORMAL LOW (ref 12.0–46.0)
Monocytes Relative: 9.5 % (ref 3.0–12.0)
Neutro Abs: 4.8 10*3/uL (ref 1.4–7.7)
Neutrophils Relative %: 71.2 % (ref 43.0–77.0)
Platelets: 187 10*3/uL (ref 150.0–400.0)
RDW: 17.7 % — ABNORMAL HIGH (ref 11.5–14.6)
Sodium: 140 meq/L (ref 135–145)
Total Bilirubin: 1.2 mg/dL (ref 0.3–1.2)

## 2009-04-24 ENCOUNTER — Telehealth: Payer: Self-pay | Admitting: Internal Medicine

## 2009-04-25 ENCOUNTER — Encounter: Payer: Self-pay | Admitting: Internal Medicine

## 2009-04-26 ENCOUNTER — Ambulatory Visit: Payer: Self-pay | Admitting: Internal Medicine

## 2009-04-29 ENCOUNTER — Telehealth: Payer: Self-pay | Admitting: Gastroenterology

## 2009-04-30 ENCOUNTER — Ambulatory Visit: Payer: Self-pay | Admitting: Gastroenterology

## 2009-04-30 ENCOUNTER — Telehealth: Payer: Self-pay | Admitting: Internal Medicine

## 2009-04-30 ENCOUNTER — Telehealth: Payer: Self-pay | Admitting: Gastroenterology

## 2009-05-02 ENCOUNTER — Ambulatory Visit: Payer: Self-pay | Admitting: Internal Medicine

## 2009-05-02 ENCOUNTER — Telehealth: Payer: Self-pay | Admitting: Gastroenterology

## 2009-05-02 ENCOUNTER — Ambulatory Visit: Payer: Self-pay | Admitting: Gastroenterology

## 2009-05-02 ENCOUNTER — Inpatient Hospital Stay (HOSPITAL_COMMUNITY): Admission: EM | Admit: 2009-05-02 | Discharge: 2009-05-16 | Payer: Self-pay | Admitting: Emergency Medicine

## 2009-05-02 ENCOUNTER — Ambulatory Visit: Payer: Self-pay | Admitting: Pulmonary Disease

## 2009-05-02 DIAGNOSIS — Z860100 Personal history of colon polyps, unspecified: Secondary | ICD-10-CM

## 2009-05-02 DIAGNOSIS — R1084 Generalized abdominal pain: Secondary | ICD-10-CM | POA: Insufficient documentation

## 2009-05-02 HISTORY — DX: Personal history of colon polyps, unspecified: Z86.0100

## 2009-05-02 HISTORY — DX: Personal history of colonic polyps: Z86.010

## 2009-05-03 ENCOUNTER — Ambulatory Visit: Payer: Self-pay | Admitting: Gastroenterology

## 2009-05-03 ENCOUNTER — Encounter (INDEPENDENT_AMBULATORY_CARE_PROVIDER_SITE_OTHER): Payer: Self-pay | Admitting: Family Medicine

## 2009-05-03 ENCOUNTER — Encounter: Payer: Self-pay | Admitting: Gastroenterology

## 2009-05-10 ENCOUNTER — Telehealth: Payer: Self-pay | Admitting: Internal Medicine

## 2009-05-10 ENCOUNTER — Encounter: Payer: Self-pay | Admitting: Emergency Medicine

## 2009-05-13 ENCOUNTER — Encounter (INDEPENDENT_AMBULATORY_CARE_PROVIDER_SITE_OTHER): Payer: Self-pay | Admitting: Internal Medicine

## 2009-05-13 ENCOUNTER — Ambulatory Visit: Payer: Self-pay | Admitting: Vascular Surgery

## 2009-05-15 ENCOUNTER — Ambulatory Visit: Payer: Self-pay | Admitting: Physical Medicine & Rehabilitation

## 2009-05-23 ENCOUNTER — Encounter: Payer: Self-pay | Admitting: Internal Medicine

## 2009-05-24 ENCOUNTER — Encounter: Payer: Self-pay | Admitting: Internal Medicine

## 2009-05-31 ENCOUNTER — Telehealth: Payer: Self-pay

## 2009-06-01 ENCOUNTER — Telehealth: Payer: Self-pay | Admitting: Family Medicine

## 2009-06-04 ENCOUNTER — Ambulatory Visit: Payer: Self-pay | Admitting: Internal Medicine

## 2009-06-04 ENCOUNTER — Encounter: Payer: Self-pay | Admitting: Cardiology

## 2009-06-04 LAB — CONVERTED CEMR LAB
BUN: 50 mg/dL — ABNORMAL HIGH (ref 6–23)
Basophils Relative: 0 % (ref 0.0–3.0)
Chloride: 99 meq/L (ref 96–112)
Creatinine, Ser: 1.9 mg/dL — ABNORMAL HIGH (ref 0.4–1.5)
Lymphs Abs: 0.8 10*3/uL (ref 0.7–4.0)
MCHC: 31.2 g/dL (ref 30.0–36.0)
MCV: 81.8 fL (ref 78.0–100.0)
Neutro Abs: 6.9 10*3/uL (ref 1.4–7.7)
Neutrophils Relative %: 79.4 % — ABNORMAL HIGH (ref 43.0–77.0)
Platelets: 205 10*3/uL (ref 150.0–400.0)
RDW: 17.4 % — ABNORMAL HIGH (ref 11.5–14.6)
WBC: 8.7 10*3/uL (ref 4.5–10.5)

## 2009-06-05 ENCOUNTER — Ambulatory Visit: Payer: Self-pay | Admitting: Internal Medicine

## 2009-06-05 ENCOUNTER — Encounter: Payer: Self-pay | Admitting: Physician Assistant

## 2009-06-06 ENCOUNTER — Telehealth: Payer: Self-pay | Admitting: Internal Medicine

## 2009-06-06 ENCOUNTER — Encounter: Payer: Self-pay | Admitting: Internal Medicine

## 2009-06-06 ENCOUNTER — Telehealth: Payer: Self-pay

## 2009-06-07 ENCOUNTER — Telehealth: Payer: Self-pay

## 2009-06-07 ENCOUNTER — Encounter: Payer: Self-pay | Admitting: Internal Medicine

## 2009-06-11 ENCOUNTER — Encounter: Payer: Self-pay | Admitting: Internal Medicine

## 2009-06-12 ENCOUNTER — Ambulatory Visit: Payer: Self-pay | Admitting: Internal Medicine

## 2009-06-12 ENCOUNTER — Encounter (INDEPENDENT_AMBULATORY_CARE_PROVIDER_SITE_OTHER): Payer: Self-pay | Admitting: Cardiology

## 2009-06-12 LAB — CONVERTED CEMR LAB
BUN: 45 mg/dL — ABNORMAL HIGH (ref 6–23)
CO2: 30 meq/L (ref 19–32)
Chloride: 100 meq/L (ref 96–112)
Creatinine, Ser: 2.2 mg/dL — ABNORMAL HIGH (ref 0.4–1.5)
Glucose, Bld: 178 mg/dL — ABNORMAL HIGH (ref 70–99)
Potassium: 3.7 meq/L (ref 3.5–5.1)

## 2009-06-19 ENCOUNTER — Ambulatory Visit: Payer: Self-pay | Admitting: Cardiovascular Disease

## 2009-06-21 ENCOUNTER — Ambulatory Visit: Payer: Self-pay | Admitting: Internal Medicine

## 2009-06-21 ENCOUNTER — Telehealth: Payer: Self-pay | Admitting: Internal Medicine

## 2009-06-26 ENCOUNTER — Ambulatory Visit: Payer: Self-pay | Admitting: Cardiology

## 2009-07-01 ENCOUNTER — Telehealth (INDEPENDENT_AMBULATORY_CARE_PROVIDER_SITE_OTHER): Payer: Self-pay | Admitting: *Deleted

## 2009-07-02 ENCOUNTER — Ambulatory Visit: Payer: Self-pay | Admitting: Internal Medicine

## 2009-07-02 LAB — CONVERTED CEMR LAB: Blood Glucose, Fingerstick: 222

## 2009-07-08 ENCOUNTER — Telehealth (INDEPENDENT_AMBULATORY_CARE_PROVIDER_SITE_OTHER): Payer: Self-pay | Admitting: *Deleted

## 2009-07-10 ENCOUNTER — Ambulatory Visit: Payer: Self-pay | Admitting: Cardiology

## 2009-07-10 LAB — CONVERTED CEMR LAB: POC INR: 1.9

## 2009-07-16 ENCOUNTER — Telehealth: Payer: Self-pay | Admitting: Internal Medicine

## 2009-07-24 ENCOUNTER — Ambulatory Visit: Payer: Self-pay | Admitting: Cardiology

## 2009-07-24 LAB — CONVERTED CEMR LAB: POC INR: 1.3

## 2009-07-25 ENCOUNTER — Encounter: Payer: Self-pay | Admitting: Internal Medicine

## 2009-07-31 ENCOUNTER — Ambulatory Visit: Payer: Self-pay | Admitting: Internal Medicine

## 2009-07-31 LAB — CONVERTED CEMR LAB: POC INR: 2.2

## 2009-08-05 ENCOUNTER — Ambulatory Visit: Payer: Self-pay | Admitting: Internal Medicine

## 2009-08-14 ENCOUNTER — Telehealth: Payer: Self-pay | Admitting: Internal Medicine

## 2009-08-16 ENCOUNTER — Encounter: Payer: Self-pay | Admitting: Internal Medicine

## 2009-08-30 ENCOUNTER — Telehealth: Payer: Self-pay | Admitting: Internal Medicine

## 2009-09-03 ENCOUNTER — Telehealth: Payer: Self-pay | Admitting: Internal Medicine

## 2009-09-05 ENCOUNTER — Telehealth: Payer: Self-pay | Admitting: Internal Medicine

## 2009-09-10 ENCOUNTER — Telehealth: Payer: Self-pay | Admitting: Internal Medicine

## 2009-09-16 ENCOUNTER — Telehealth: Payer: Self-pay | Admitting: Internal Medicine

## 2009-09-26 ENCOUNTER — Telehealth: Payer: Self-pay | Admitting: Internal Medicine

## 2009-10-10 ENCOUNTER — Ambulatory Visit: Payer: Self-pay | Admitting: Cardiology

## 2009-10-10 LAB — CONVERTED CEMR LAB: POC INR: 2.4

## 2009-11-06 ENCOUNTER — Telehealth: Payer: Self-pay | Admitting: Internal Medicine

## 2009-11-07 ENCOUNTER — Ambulatory Visit: Payer: Self-pay | Admitting: Internal Medicine

## 2009-11-11 ENCOUNTER — Ambulatory Visit: Payer: Self-pay | Admitting: Internal Medicine

## 2009-11-11 DIAGNOSIS — E1149 Type 2 diabetes mellitus with other diabetic neurological complication: Secondary | ICD-10-CM

## 2009-11-11 HISTORY — DX: Type 2 diabetes mellitus with other diabetic neurological complication: E11.49

## 2009-11-11 LAB — CONVERTED CEMR LAB
Basophils Absolute: 0.1 10*3/uL (ref 0.0–0.1)
Chloride: 94 meq/L — ABNORMAL LOW (ref 96–112)
Creatinine, Ser: 1.7 mg/dL — ABNORMAL HIGH (ref 0.4–1.5)
Eosinophils Absolute: 0.3 10*3/uL (ref 0.0–0.7)
Hemoglobin: 14.2 g/dL (ref 13.0–17.0)
Hgb A1c MFr Bld: 15.2 % — ABNORMAL HIGH (ref 4.6–6.5)
Lymphocytes Relative: 13.3 % (ref 12.0–46.0)
MCHC: 33.3 g/dL (ref 30.0–36.0)
Neutro Abs: 6.4 10*3/uL (ref 1.4–7.7)
Neutrophils Relative %: 73.3 % (ref 43.0–77.0)
Platelets: 210 10*3/uL (ref 150.0–400.0)
RDW: 17.4 % — ABNORMAL HIGH (ref 11.5–14.6)
Sodium: 137 meq/L (ref 135–145)

## 2009-12-05 ENCOUNTER — Telehealth: Payer: Self-pay | Admitting: Internal Medicine

## 2009-12-06 ENCOUNTER — Ambulatory Visit: Payer: Self-pay | Admitting: Internal Medicine

## 2009-12-06 LAB — CONVERTED CEMR LAB: POC INR: 2.7

## 2009-12-16 ENCOUNTER — Telehealth: Payer: Self-pay

## 2009-12-16 ENCOUNTER — Telehealth: Payer: Self-pay | Admitting: Internal Medicine

## 2010-01-02 ENCOUNTER — Ambulatory Visit: Payer: Self-pay | Admitting: Internal Medicine

## 2010-01-02 LAB — CONVERTED CEMR LAB: POC INR: 3

## 2010-01-07 ENCOUNTER — Telehealth: Payer: Self-pay | Admitting: Internal Medicine

## 2010-01-17 ENCOUNTER — Encounter (INDEPENDENT_AMBULATORY_CARE_PROVIDER_SITE_OTHER): Payer: Self-pay | Admitting: *Deleted

## 2010-01-30 ENCOUNTER — Ambulatory Visit: Payer: Self-pay | Admitting: Cardiology

## 2010-02-06 ENCOUNTER — Telehealth: Payer: Self-pay | Admitting: Internal Medicine

## 2010-02-20 ENCOUNTER — Ambulatory Visit: Payer: Self-pay | Admitting: Cardiovascular Disease

## 2010-02-20 LAB — CONVERTED CEMR LAB: POC INR: 3.3

## 2010-03-06 ENCOUNTER — Ambulatory Visit: Payer: Self-pay | Admitting: Internal Medicine

## 2010-03-06 LAB — CONVERTED CEMR LAB: POC INR: 4.7

## 2010-03-14 ENCOUNTER — Ambulatory Visit: Payer: Self-pay | Admitting: Cardiology

## 2010-03-14 LAB — CONVERTED CEMR LAB: POC INR: 3.1

## 2010-03-27 ENCOUNTER — Ambulatory Visit
Admission: RE | Admit: 2010-03-27 | Discharge: 2010-03-27 | Payer: Self-pay | Source: Home / Self Care | Attending: Cardiology | Admitting: Cardiology

## 2010-03-27 LAB — CONVERTED CEMR LAB: POC INR: 1.7

## 2010-04-10 ENCOUNTER — Ambulatory Visit: Admission: RE | Admit: 2010-04-10 | Discharge: 2010-04-10 | Payer: Self-pay | Source: Home / Self Care

## 2010-04-10 LAB — CONVERTED CEMR LAB: POC INR: 1.4

## 2010-04-14 ENCOUNTER — Telehealth: Payer: Self-pay

## 2010-04-24 ENCOUNTER — Ambulatory Visit: Admit: 2010-04-24 | Payer: Self-pay

## 2010-04-24 ENCOUNTER — Encounter (INDEPENDENT_AMBULATORY_CARE_PROVIDER_SITE_OTHER): Payer: Medicare Other

## 2010-04-24 ENCOUNTER — Encounter: Payer: Self-pay | Admitting: Cardiology

## 2010-04-24 DIAGNOSIS — Z7901 Long term (current) use of anticoagulants: Secondary | ICD-10-CM

## 2010-04-24 DIAGNOSIS — I4891 Unspecified atrial fibrillation: Secondary | ICD-10-CM

## 2010-04-24 NOTE — Medication Information (Signed)
Summary: ROV/AM  Anticoagulant Therapy  Managed by: Louann Sjogren, PharmD Referring MD: [name] PCP: [name], MD Supervising MD: [name] MD, Sharlot Gowda Indication 1: Atrial Fibrillation (ICD-427.31) Indication 2:  ---- Lab Used: [facility] Site: [address] INR POC 1.4 INR RANGE 2-3  Dietary changes: no    Health status changes: no    Bleeding/hemorrhagic complications: no    Recent/future hospitalizations: no    Any changes in medication regimen? yes       Details: Completed abx approx 2 weeks ago.    Recent/future dental: no  Any missed doses?: no       Is patient compliant with meds? yes       Allergies: 1)  Tetracycline Hcl (Tetracycline Hcl) 2)  Aspirin (Aspirin)  Anticoagulation Management History:      The patient is taking warfarin and comes in today for a routine follow up visit.  Positive risk factors for bleeding include presence of serious comorbidities.  Negative risk factors for bleeding include an age less than 66 years old.  The bleeding index is 'intermediate risk'.  Positive CHADS2 values include History of CHF, History of HTN, and History of Diabetes.  Negative CHADS2 values include Age > 39 years old.  The start date was 10/23/2003.  His last INR was 1.8.  Anticoagulation responsible provider: [name] MD, Sharlot Gowda.  INR POC: 1.4.  Cuvette Lot#: [id number].  Exp: 03/2011.    Anticoagulation Management Assessment/Plan:      The patient's current anticoagulation dose is Coumadin 5 mg tabs: Take as directed by coumadin clinic..  The target INR is 2.0-3.0.  The next INR is due 04/24/2010.  Anticoagulation instructions were given to patient.  Results were reviewed/authorized by Louann Sjogren, PharmD.  He was notified by Cloyde Reams RN.         Prior Anticoagulation Instructions: INR 1.7  Coumadin 5 mg tablets - Take an extra half tablet today, then take 0.5 tablet every day except 1 tablet on Thursdays.   Current Anticoagulation  Instructions: INR 1.4  Take 1.5 tablets today, then start taking 1/2 tablet daily except 1 tablet on Thursdays and Sundays.  Recheck in 2 weeks.

## 2010-04-24 NOTE — Assessment & Plan Note (Signed)
Summary: follow up from rehab/cjr   Vital Signs:  Patient profile:   65 year old male Weight:      188 pounds Temp:     98.3 degrees F oral BP sitting:   110 / 62  (right arm)  Vitals Entered By: Duard Brady LPN (June 04, 2009 3:46 PM) CC: post rehab dc - doing ok - productive cough Is Patient Diabetic? Yes CBG Result 369   Primary Care Provider:  [name], MD  CC:  post rehab dc - doing ok - productive cough.  History of Present Illness: 65 year old patient who is discharge in the hospital approximately 3 weeks ago for decompensated congestive heart failure.  He was discharged to a rehab facility for two weeks and has been home for approximately 5 days.  Remains weak, but doing quite well.  His weight remained stable on high-dose diuretic therapy.  His diabetes remains poorly controlled and he has not been able to afford mealtime insulin.  Samples were provided.  Patient assistance programs will be utilized and forms were completed.  He has gastroesophageal reflux disease and a history of chronic atrial fibrillation.  He has COPD  Allergies: 1)  Tetracycline Hcl (Tetracycline Hcl) 2)  Aspirin (Aspirin)  Past History:  Past Medical History: Reviewed history from 05/02/2009 and no changes required. 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 Bilateral Hernia sx Pilonidal cyst Defibrillator Placement Angioplasty  Review of Systems   The patient complains of weight loss, dyspnea on exertion, and prolonged cough.  The patient denies anorexia, fever, weight gain, vision loss, decreased hearing, hoarseness, chest pain, syncope, peripheral edema, headaches, hemoptysis, abdominal pain, melena, hematochezia, severe indigestion/heartburn, hematuria, incontinence, genital sores, muscle weakness, suspicious skin lesions, transient blindness, difficulty walking, depression, unusual weight change, abnormal bleeding, enlarged lymph nodes, angioedema, breast masses, and testicular masses.    Physical Exam  General:  overweight-appearing.  110/70overweight-appearing.   Head:  Normocephalic and atraumatic without obvious abnormalities. No apparent alopecia or balding. Eyes:  No corneal or conjunctival inflammation noted. EOMI. Perrla. Funduscopic exam benign, without hemorrhages, exudates or papilledema. Vision grossly normal. Mouth:  Oral mucosa and oropharynx without lesions or exudates.  Teeth in good repair. Neck:  No deformities, masses, or tenderness noted. Lungs:  scatter rhonchi; rales, left base Heart:  irregular rhythm with controlled ventricular response Abdomen:  Bowel sounds positive,abdomen soft and non-tender without masses, organomegaly or hernias noted. Msk:  No deformity or scoliosis noted of thoracic or lumbar spine.   Extremities:  no clinical edema Skin:  Intact without suspicious lesions or rashes Cervical Nodes:  No lymphadenopathy noted Axillary Nodes:  No palpable lymphadenopathy   Impression & Recommendations:  Problem # 1:  CONGESTIVE HEART FAILURE (ICD-428.00)  His updated medication list for this problem includes:    Metolazone 2.5 Mg Tabs (Metolazone) ..... Monday wednesday, and friday only    Coumadin 2.5 Mg Tabs (Warfarin sodium) ..... Use as directed per coumadin clinic    Metoprolol Succinate 50 Mg Xr24h-tab (Metoprolol succinate) ..... One daily    Metolazone 2.5 Mg Tabs (Metolazone) ..... Qd     Furosemide 80 Mg Tabs (Furosemide) ..... One twice daily  His updated medication list for this problem includes:    Metolazone 2.5 Mg Tabs (Metolazone) ..... Monday wednesday, and friday only    Coumadin 2.5 Mg Tabs (Warfarin sodium) ..... Use as directed per coumadin clinic    Metoprolol Succinate 50 Mg Xr24h-tab (Metoprolol succinate) ..... One daily    Metolazone 2.5 Mg Tabs (Metolazone) ..... Qd    Furosemide 80 Mg Tabs (Furosemide) ..... 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 Monday wednesday, and friday only    Metoprolol Succinate 50 Mg Xr24h-tab (Metoprolol succinate) ..... One daily    Metolazone 2.5 Mg Tabs (Metolazone) ..... Qd    Furosemide 80 Mg Tabs (Furosemide) ..... One twice daily  His updated medication list for this problem includes:    Metolazone 2.5 Mg Tabs (Metolazone) ..... Monday wednesday, and friday only    Metoprolol Succinate 50 Mg Xr24h-tab (Metoprolol succinate) ..... One daily    Metolazone 2.5 Mg Tabs (Metolazone) ..... Qd    Furosemide 80 Mg Tabs (Furosemide) ..... One twice daily  Problem # 4:  DIABETES MELLITUS, TYPE II (ICD-250.00)  The following medications were removed from the medication list:    Glyburide-metformin 2.5-500 Mg Tabs (Glyburide-metformin) .Marland Kitchen... 2 twice daily His updated medication list for this problem includes:    Humalog Kwikpen 100 Unit/ml Soln (Insulin lispro (human)) .Marland Kitchen... 12 units prior to every meal    Lantus Solostar 100 Unit/ml Soln (Insulin glargine) .Marland Kitchen... 20 units at bedtime  Orders: Capillary Blood Glucose/CBG (16109)  The following medications were removed from the medication list:    Glyburide-metformin 2.5-500 Mg Tabs (Glyburide-metformin) .Marland Kitchen... 2 twice daily His updated medication list for this problem includes:    Humalog Kwikpen  100 Unit/ml Soln (Insulin lispro (human)) .Marland Kitchen... 12 units prior to every meal    Lantus Solostar 100 Unit/ml Soln (Insulin glargine) .Marland Kitchen... 20 units at bedtime  Complete Medication List: 1)  Advair Diskus 250-50 Mcg/dose Misc (Fluticasone-salmeterol) .... Inhale 1 puff as directed twice a day 2)  Metolazone 2.5 Mg Tabs (Metolazone) .... Monday wednesday, and friday only 3)  Vytorin 10-40 Mg Tabs (Ezetimibe-simvastatin) .... Qd 4)  Coumadin 2.5 Mg Tabs (Warfarin sodium) .... Use as directed per coumadin clinic 5)  Klor-con M20 20 Meq Tbcr (Potassium chloride crys cr) .Marland Kitchen.. 1 two times a day 6)  Prinivil 10 Mg Tabs (lisinopril)  .... One daily 7)  Amiodarone Hcl 200 Mg Tabs (Amiodarone hcl) .... One half tablet in the morning and one half in the evening. 8)  Humalog Kwikpen 100 Unit/ml Soln (Insulin lispro (human)) .Marland Kitchen.. 12 units prior to every meal 9)  Novofine 32g X 6 Mm Misc (Insulin pen needle) .... As directed 10)  Proair Hfa 108 (90 Base) Mcg/act Aers (Albuterol sulfate) .... Two puffs every 6 hours for shortness of breath 11)  Insulin Syringe/needle 27g X 1/2" 0.5 Ml Misc (Insulin syringe-needle u-100) .... Used twice daily 12)  Metoprolol Succinate 50 Mg Xr24h-tab (Metoprolol succinate) .... One daily 13)  Multivitamins Tabs (Multiple vitamin) .Marland Kitchen.. 1 tablet by mouth once daily 14)  Aciphex 20 Mg Tbec (Rabeprazole sodium) .... Take one tab daily 15)  Lantus Solostar 100 Unit/ml Soln (Insulin glargine) .... 20 units at bedtime 16)  Spiriva Handihaler 18 Mcg Caps (Tiotropium bromide monohydrate) .... Qd 17)  Metolazone 2.5 Mg Tabs (Metolazone) .... Qd 18)  Furosemide  80 Mg Tabs (Furosemide) .... One twice daily  Other Orders: Venipuncture (19147) TLB-BMP (Basic Metabolic Panel-BMET) (80048-METABOL) TLB-CBC Platelet - w/Differential (85025-CBCD)  Patient Instructions: 1)  Please schedule a follow-up appointment in 2 weeks. 2)  Limit your Sodium (Salt) to less than 2 grams a day(slightly  less than 1/2 a teaspoon) to prevent fluid retention, swelling, or worsening of symptoms. 3)  It is important that you exercise regularly at least 20 minutes 5 times a week. If you develop chest pain, have severe difficulty breathing, or feel very tired , stop exercising immediately and seek medical attention. Prescriptions: FUROSEMIDE 80 MG TABS (FUROSEMIDE) one twice daily  #180 x 6   Entered and Authorized by:   Gordy Savers  MD   Signed by:   Gordy Savers  MD on 06/04/2009   Method used:   Print then Give to Patient   RxID:   [id number] FUROSEMIDE 80 MG TABS (FUROSEMIDE) one twice daily  #180 x 6   Entered and Authorized by:   Gordy Savers  MD   Signed by:   Gordy Savers  MD on 06/04/2009   Method used:   Electronically to        [facility] [phone]* (retail)       [address]       [address]       [place], [place]  [address]       Ph: [phone]       Fax: [phone]   RxID:   [id number]

## 2010-04-24 NOTE — Medication Information (Signed)
Summary: rov/mw  Anticoagulant Therapy  Managed by: Bethena Midget, RN, BSN Referring MD: [name] PCP: [name], MD Supervising MD: Graciela Husbands MD, Viviann Spare Indication 1: Atrial Fibrillation (ICD-427.31) Indication 2:  ---- Lab Used: [facility] Site: [address] INR POC 4.7 INR RANGE 2-3  Dietary changes: no    Health status changes: yes       Details: Ear infection in Rt ear  Bleeding/hemorrhagic complications: no    Recent/future hospitalizations: no    Any changes in medication regimen? yes       Details: Taking ABX for 7 days has another week on it. Called Pharmacy its Bactrim DS BID for 15 days.   Recent/future dental: no  Any missed doses?: no       Is patient compliant with meds? yes       Allergies: 1)  Tetracycline Hcl (Tetracycline Hcl) 2)  Aspirin (Aspirin)  Anticoagulation Management History:      The patient is taking warfarin and comes in today for a routine follow up visit.  Positive risk factors for bleeding include presence of serious comorbidities.  Negative risk factors for bleeding include an age less than 65 years old.  The bleeding index is 'intermediate risk'.  Positive CHADS2 values include History of CHF, History of HTN, and History of Diabetes.  Negative CHADS2 values include Age > 73 years old.  The start date was 10/23/2003.  His last INR was 1.8.  Anticoagulation responsible provider: Graciela Husbands MD, Viviann Spare.  INR POC: 4.7.  Cuvette Lot#: [id number].  Exp: 04/2011.    Anticoagulation Management Assessment/Plan:      The patient's current anticoagulation dose is Coumadin 5 mg tabs: Take as directed by coumadin clinic..  The target INR is 2.0-3.0.  The next INR is due 03/14/2010.  Anticoagulation instructions were given to patient.  Results were reviewed/authorized by Bethena Midget, RN, BSN.  He was notified by Bethena Midget, RN, BSN.         Prior Anticoagulation Instructions: INR 3.3 Skip your dose today. Take a half tablet tomorrow. New  schedule: take 1 tablet on mondays and fridays. And a half tablet all other days. Recheck in 2 weeks.   Current Anticoagulation Instructions: INR 4.7 Skip today and Fridays dose then change dose to 1/2 pill everyday while on Antibiotic. Recheck in one week.

## 2010-04-24 NOTE — Progress Notes (Signed)
Summary: pt picked up ppwk   Call back at pt into office      Additional Follow-up for Phone Call Additional follow up Details #2::    pt into office to pick up ppwk that Dr. Amador Cunas filled out, and test strips , I also gave them a hand written copy of insulin instructions. Verbalized understanding Follow-up by: Duard Brady LPN,  June 07, 2009 5:10 PM

## 2010-04-24 NOTE — Medication Information (Signed)
Summary: Rx for Humalog Kwikpen  Rx for Humalog Kwikpen   Imported By: Maryln Gottron 06/11/2009 10:30:27  _____________________________________________________________________  External Attachment:    Type:   Image     Comment:   External Document

## 2010-04-24 NOTE — Medication Information (Signed)
Summary: [name]  Anticoagulant Therapy  Managed by: Louann Sjogren, PharmD Referring MD: [name] PCP: [name], MD Supervising MD: Myrtis Ser MD, Tinnie Gens Indication 1: Atrial Fibrillation (ICD-427.31) Indication 2:  ---- Lab Used: [facility] Site: [address] INR POC 1.7 INR RANGE 2-3  Dietary changes: no    Health status changes: no    Bleeding/hemorrhagic complications: no    Recent/future hospitalizations: no    Any changes in medication regimen? no    Recent/future dental: no  Any missed doses?: no       Is patient compliant with meds? yes       Allergies: 1)  Tetracycline Hcl (Tetracycline Hcl) 2)  Aspirin (Aspirin)  Anticoagulation Management History:      The patient is taking warfarin and comes in today for a routine follow up visit.  Positive risk factors for bleeding include presence of serious comorbidities.  Negative risk factors for bleeding include an age less than 5 years old.  The bleeding index is 'intermediate risk'.  Positive CHADS2 values include History of CHF, History of HTN, and History of Diabetes.  Negative CHADS2 values include Age > 44 years old.  The start date was 10/23/2003.  His last INR was 1.8.  Anticoagulation responsible provider: Myrtis Ser MD, Tinnie Gens.  INR POC: 1.7.  Cuvette Lot#: [id number].  Exp: 04/2011.    Anticoagulation Management Assessment/Plan:      The patient's current anticoagulation dose is Coumadin 5 mg tabs: Take as directed by coumadin clinic..  The target INR is 2.0-3.0.  The next INR is due 04/10/2010.  Anticoagulation instructions were given to patient.  Results were reviewed/authorized by Louann Sjogren, PharmD.  He was notified by Stephannie Peters, PharmD Candidate .         Prior Anticoagulation Instructions: Cont on current regimen Return to clinic on Jan 6th, at 945 am  Current Anticoagulation Instructions: INR 1.7  Coumadin 5 mg tablets - Take an extra half tablet today, then take 0.5 tablet every  day except 1 tablet on Thursdays.

## 2010-04-24 NOTE — Assessment & Plan Note (Signed)
Summary: EPH    Visit Type:  EPH Primary Provider:  [name], MD  CC:  chest pain...denies any sob or edema.  History of Present Illness: This is a 65 year old white male patient, who was in the hospital for acute systolic heart failure exacerbation. He diuresed and was sent home on metolazone 2.5 mg daily, and Lasix. 100 mg b.i.d. These were adjusted yesterday by Dr. Amador Cunas. The patient was discharged to a rehabilitation facility for 2 weeks, for deconditioning,but has been home now for 6 days.  The patient is feeling quite well. He denies dyspnea, and dyspnea on exertion, dizziness, chest pain, palpitations, or presyncope. He does have trouble affording his medications and primary care has tried to assist him with this. He did have a slightly elevated TSH earlier this month at 4.59. That will need to be followed while on amiodarone. Most recent BUN and creatinine were 50 and 1.9 as of yesterday.  Current Medications (verified): 1)  Advair Diskus 250-50 Mcg/dose Misc (Fluticasone-Salmeterol) .... Inhale 1 Puff As Directed Twice A Day 2)  Metolazone 2.5 Mg Tabs (Metolazone) .... Monday Wednesday, and Friday Only 3)  Vytorin 10-40 Mg Tabs (Ezetimibe-Simvastatin) .... Qd 4)  Coumadin 2.5 Mg Tabs (Warfarin Sodium) .... Use As Directed Per Coumadin Clinic 5)  Klor-Con M20 20 Meq  Tbcr (Potassium Chloride Crys Cr) .Marland Kitchen.. 1 Two Times A Day 6)  Prinivil 10  Mg  Tabs (Lisinopril) .... One Daily 7)  Amiodarone Hcl 200 Mg  Tabs (Amiodarone Hcl) .... One Half Tablet in The Morning and One Half in The Evening. 8)  Humalog Kwikpen 100 Unit/ml Soln (Insulin Lispro (Human)) .Marland Kitchen.. 12 Units Prior To Every Meal 9)  Novofine 32g X 6 Mm Misc (Insulin Pen Needle) .... As Directed 10)  Proair Hfa 108 (90 Base) Mcg/act Aers (Albuterol Sulfate) .... Two Puffs Every 6 Hours For Shortness of Breath 11)  Insulin Syringe/needle 27g X 1/2" 0.5 Ml Misc (Insulin Syringe-Needle U-100) .... Used Twice Daily 12)   Metoprolol Succinate 50 Mg Xr24h-Tab (Metoprolol Succinate) .... One Daily 13)  Multivitamins  Tabs (Multiple Vitamin) .Marland Kitchen.. 1 Tablet By Mouth Once Daily 14)  Aciphex 20 Mg Tbec (Rabeprazole Sodium) .... Take One Tab Daily 15)  Lantus Solostar 100 Unit/ml Soln (Insulin Glargine) .... 20 Units At Bedtime 16)  Spiriva Handihaler 18 Mcg Caps (Tiotropium Bromide Monohydrate) .... Qd 17)  Furosemide 80 Mg Tabs (Furosemide) .... 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) COPD (ICD-496) HYPERTENSION (ICD-401.9) HYPERLIPIDEMIA (ICD-272.4) DIABETES MELLITUS, TYPE II (ICD-250.00) ATRIAL FIBRILLATION (ICD-427.31) DYSLIPIDEMIA (ICD-272.4)  CORONARY ARTERY DISEASE (ICD-414.00) ATRIAL FLUTTER (ICD-427.32) CONGESTIVE HEART FAILURE (ICD-428.0)  Review of Systems       see history of present illness  Vital Signs:  Patient profile:   65 year old male Height:      66 inches Weight:      184 pounds BMI:     29.81 Pulse rate:   93 / minute Pulse rhythm:   irregular BP sitting:   110 / 60  (left arm) Cuff size:   regular  Vitals Entered By: Danielle Rankin, CMA (June 05, 2009 1:51 PM)  Physical Exam  General:   Well-nournished, in no acute distress. Neck: No JVD, HJR, Bruit, or thyroid enlargement  Lungs: No tachypnea, clear without wheezing, rales, or rhonchi Cardiovascular: RRR, PMI not displaced, heart sounds normal, no murmurs, gallops, bruit, thrill, or heave. Abdomen: BS normal. Soft without organomegaly, masses, lesions or tenderness. Extremities: without cyanosis, clubbing or edema. Good distal pulses bilateral SKin: Warm, no lesions or rashes  Musculoskeletal:  No deformities Neuro: no focal signs    EKG  Procedure date:  06/05/2009  Findings:      atrial fibrillation, 90 beats per minute, right bundle branch block  Impression & Recommendations:  Problem # 1:  CONGESTIVE HEART FAILURE (ICD-428.00) Patient is currently well compensated as far as his heart failure is concerned. His metolazone was decreased yesterday to 2.5 mg 3 times weekly. We may need to decrease this again. I will schedule lab work for next week to make sure his kidneys are not adversely affected by this.Patient did have an echo, which showed moderate LV dysfunction, ejection fraction was not estimated. The following medications were removed from the medication list:    Metolazone 2.5 Mg Tabs (Metolazone) ..... Qd His updated medication list for this problem includes:    Metolazone 2.5 Mg Tabs (Metolazone) ..... Monday wednesday, and friday only    Coumadin 2.5 Mg Tabs (Warfarin sodium) ..... Use as directed per coumadin clinic    Amiodarone Hcl 200 Mg Tabs (Amiodarone hcl) ..... One half tablet in the morning and one half in the evening.    Metoprolol Succinate 50 Mg Xr24h-tab (Metoprolol succinate) ..... One daily    Furosemide 80 Mg Tabs (Furosemide) ..... One twice daily  Problem # 2:  RENAL INSUFFICIENCY (ICD-588.9) BUN earlier this month was 82, but yesterday was down to 50, creatinine remains 1.9. We'll recheck this next week.  Problem # 3:  ATRIAL FIBRILLATION (ICD-427.31) Patient is in atrial fibrillation. He is on amiodarone and his amiodarone levels were low at 0.3. He claims that he is taking this medication and has not missed any doses. His TSH is slightly elevated at 4.59. This should be rechecked in several months. His updated medication list for this problem includes:    Coumadin 2.5 Mg Tabs (Warfarin sodium) ..... Use as directed per coumadin clinic    Amiodarone Hcl 200 Mg Tabs (Amiodarone hcl) ..... One half tablet in the morning and one half in the  evening.    Metoprolol Succinate 50 Mg Xr24h-tab (Metoprolol succinate) ..... One daily  Problem # 4:  CORONARY ARTERY DISEASE (ICD-414.00) coronary artery disease, is stable. He denies chest pain. His updated medication list for this problem includes:    Coumadin 2.5 Mg Tabs (Warfarin sodium) ..... Use as directed per coumadin clinic    Metoprolol Succinate 50 Mg Xr24h-tab (Metoprolol succinate) ..... One daily  Orders: EKG w/ Interpretation (93000)  Patient Instructions: 1)  Your physician recommends that you schedule a follow-up appointment in: 1 month with Dr. [name] 2)  Your physician recommends that you return for lab work on June 12, 2009 --BMP,BNP - 428.22,414.01 3)  Your physician recommends that you continue on your current medications as directed. Please refer to the Current Medication list given to you today.

## 2010-04-24 NOTE — Assessment & Plan Note (Signed)
Summary: 2 wk follow up/cjr   Vital Signs:  Patient profile:   65 year old male Weight:      188 pounds O2 Sat:      97 % on Room air Temp:     97.9 degrees F  Vitals Entered By: Duard Brady LPN (July 02, 2009 1:03 PM)  O2 Flow:  Room air CC: f/u doing better - fbs   284 Is Patient Diabetic? Yes Did you bring your meter with you today? No CBG Result 222   Primary Care Provider:  [name], MD  CC:  f/u doing better - fbs   284.  History of Present Illness: 65 year old patient who is seen today for follow-up.  His chronic systolic heart failure, type 2 diabetes, atrial fibrillation.  He has coronary artery disease.  He is fairly stable.  Electrolytes were checked a few weeks ago, potassium 3.7.  His lung status has been fairly well controlled on his present diuretic regimen.  Furosemide and metolazone, have both been down titrated.  He denies any shortness of breath.  He remains on basal and mealtime insulin.  He has had a difficult time with medication cost and copious samples have been dispensed today  Allergies: 1)  Tetracycline Hcl (Tetracycline Hcl) 2)  Aspirin (Aspirin)  Past History:  Past Medical History: Reviewed history from 05/02/2009 and no changes required. 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  The patient denies anorexia, fever, weight loss, vision loss, decreased hearing, hoarseness, chest pain, syncope, dyspnea on  exertion, prolonged cough, headaches, hemoptysis, abdominal pain, melena, hematochezia, severe indigestion/heartburn, hematuria, incontinence, genital sores, muscle weakness, suspicious skin lesions, transient blindness, difficulty walking, depression, unusual weight change, abnormal bleeding, enlarged lymph nodes, angioedema, breast masses, and testicular masses.    Physical Exam  General:  overweight-appearing.  120/72overweight-appearing.   Head:  Normocephalic and atraumatic without obvious abnormalities. No apparent alopecia or balding. Mouth:  Oral mucosa and oropharynx without lesions or exudates.  Teeth in good repair. Neck:  no neck vein distention Lungs:  few crackles, left base.  Right lung is clear O2 saturation 97% Heart:  irregular rhythm, with a controlled ventricular response Abdomen:  obese soft and nontender Extremities:  trace left pedal edema and 1+ right pedal edema.  trace left pedal edema.     Impression & Recommendations:  Problem # 1:  CONGESTIVE HEART FAILURE (ICD-428.00)  His updated medication list for this problem includes:    Metolazone 2.5 Mg Tabs (Metolazone) ..... Monday wednesday, and friday only    Coumadin 5 Mg Tabs (Warfarin sodium) .Marland Kitchen... Take as directed by coumadin clinic.    Metoprolol Succinate 50 Mg Xr24h-tab (Metoprolol succinate) ..... One daily    Furosemide 80 Mg Tabs (Furosemide) ..... One twice daily  His updated medication list for this problem includes:    Metolazone 2.5 Mg Tabs (Metolazone) ..... Monday wednesday, and friday only    Coumadin 5 Mg Tabs (Warfarin sodium) .Marland Kitchen... Take as directed by coumadin clinic.    Metoprolol Succinate 50 Mg Xr24h-tab (Metoprolol succinate) ..... One daily    Furosemide 80 Mg Tabs (Furosemide) ..... One  twice daily  Problem # 2:  DIABETES MELLITUS, TYPE II (ICD-250.00)  His updated medication list for this problem includes:    Humalog Kwikpen 100 Unit/ml Soln (Insulin lispro (human)) .Marland Kitchen... 12 units  prior to every meal    Lantus Solostar 100 Unit/ml Soln (Insulin glargine) .Marland Kitchen... 20 units at bedtime  Orders: Capillary Blood Glucose/CBG (82956)  His updated medication list for this problem includes:    Humalog Kwikpen 100 Unit/ml Soln (Insulin lispro (human)) .Marland Kitchen... 12 units prior to every meal    Lantus Solostar 100 Unit/ml Soln (Insulin glargine) .Marland Kitchen... 20 units at bedtime  Complete Medication List: 1)  Advair Diskus 250-50 Mcg/dose Misc (Fluticasone-salmeterol) .... Inhale 1 puff as directed twice a day 2)  Metolazone 2.5 Mg Tabs (Metolazone) .... Monday wednesday, and friday only 3)  Vytorin 10-40 Mg Tabs (Ezetimibe-simvastatin) .... Qd 4)  Coumadin 5 Mg Tabs (Warfarin sodium) .... Take as directed by coumadin clinic. 5)  Klor-con M20 20 Meq Tbcr (Potassium chloride crys cr) .Marland Kitchen.. 1 two times a day 6)  Prinivil 10 Mg Tabs (lisinopril)  .... One daily 7)  Amiodarone Hcl 200 Mg Tabs (Amiodarone hcl) .... One half tablet in the morning and one half in the evening. 8)  Humalog Kwikpen 100 Unit/ml Soln (Insulin lispro (human)) .Marland Kitchen.. 12 units prior to every meal 9)  Novofine 32g X 6 Mm Misc (Insulin pen needle) .... As directed 10)  Proair Hfa 108 (90 Base) Mcg/act Aers (Albuterol sulfate) .... Two puffs every 6 hours for shortness of breath 11)  Insulin Syringe/needle 27g X 1/2" 0.5 Ml Misc (Insulin syringe-needle u-100) .... Used twice daily 12)  Metoprolol Succinate 50 Mg Xr24h-tab (Metoprolol succinate) .... One daily 13)  Multivitamins Tabs (Multiple vitamin) .Marland Kitchen.. 1 tablet by mouth once daily 14)  Aciphex 20 Mg Tbec (Rabeprazole sodium) .... Take one tab daily 15)  Lantus Solostar 100 Unit/ml Soln (Insulin glargine) .... 20 units at bedtime 16)  Spiriva Handihaler 18 Mcg Caps (Tiotropium bromide monohydrate) .... Qd 17)  Furosemide 80 Mg Tabs (Furosemide) .... One twice daily  Patient Instructions: 1)  Limit your Sodium (Salt). 2)  It is important that you exercise regularly at least  20 minutes 5 times a week. If you develop chest pain, have severe difficulty breathing, or feel very tired , stop exercising immediately and seek medical attention. 3)  You need to lose weight. Consider a lower calorie diet and regular exercise.  4)  call if  there is any weight gain in excess of 5 pounds 5)  Please schedule a follow-up appointment in 6 weeks

## 2010-04-24 NOTE — Letter (Signed)
Summary: Generic Letter  [place] [facility]  [address] [place], [place] [address]   Phone: [phone]  Fax: [phone]    08/16/2009  [facility] [address] [place], [place]  [address]  Dear [name]:  Please excuse from work due to wife's illness;  she may need 2 weks to recover from her illness.           Sincerely,   Jon Chiquito  MD

## 2010-04-24 NOTE — Letter (Signed)
Summary: Miralax Instructions (changed prep)  [facility]  [address] [place], [place] [address]   Phone: [phone]  Fax: [phone]       [name]    1945/12/10    MRN: [id number]       Procedure Day /Date:TUESDAY 04/30/2009     Arrival Time:  8AM     Procedure Time: 9AM     Location of Procedure:                    X   [facility] (4th Floor) _  PREPARATION FOR COLONOSCOPY WITH MIRALAX  Starting 5 days prior to your procedure 04/25/2009 do not eat nuts, seeds, popcorn, corn, beans, peas,  salads, or any raw vegetables.  Do not take any fiber supplements (e.g. Metamucil, Citrucel, and Benefiber). ____________________________________________________________________________________________________   THE DAY BEFORE YOUR PROCEDURE         DATE: 04/29/2009 DAY: MONDAY  1   Drink clear liquids the entire day-NO SOLID FOOD  2   Do not drink anything colored red or purple.  Avoid juices with pulp.  No orange juice.  3   Drink at least 64 oz. (8 glasses) of fluid/clear liquids during the day to prevent dehydration and help the prep work efficiently.  CLEAR LIQUIDS INCLUDE: Water Jello Ice Popsicles Tea (sugar ok, no milk/cream) Powdered fruit flavored drinks Coffee (sugar ok, no milk/cream) Gatorade Juice: apple, white grape, white cranberry  Lemonade Clear bullion, consomm, broth Carbonated beverages (any kind) Strained chicken noodle soup Hard Candy  4   Mix the entire bottle of Miralax with 64 oz. of Gatorade/Powerade in the morning and put in the refrigerator to chill.  5   At 3:00 pm take 2 Dulcolax/Bisacodyl tablets.  6   At 4:30 pm take one Reglan/Metoclopramide tablet.  7  Starting at 5:00 pm drink one 8 oz glass of the Miralax mixture every 15-20 minutes until you have finished drinking the entire 64 oz.  You should finish drinking prep around 7:30 or 8:00 pm.  8   If you are nauseated, you may take the 2nd Reglan/Metoclopramide  tablet at 6:30 pm.        9    At 8:00 pm take 2 more DULCOLAX/Bisacodyl tablets.     THE DAY OF YOUR PROCEDURE      DATE:  04/30/2009  DAY: Jake Shark  You may drink clear liquids until 7AM  (2 HOURS BEFORE PROCEDURE).   MEDICATION INSTRUCTIONS  Unless otherwise instructed, you should take regular prescription medications with a small sip of water as early as possible the morning of your procedure.  Diabetic patients - see separate instructions.  INSULIN/ORAL            Stop taking Coumadin on  04/25/2009 (5 days before procedure).  Additional medication instructions: You will be contaced by our office prior to your procedure for directions on holding your Coumadin/Warfarin.  If you do not hear from our office 1 week prior to your scheduled procedure, please call [phone] to discuss.          OTHER INSTRUCTIONS  You will need a responsible adult at least 65 years of age to accompany you and drive you home.   This person must remain in the waiting room during your procedure.  Wear loose fitting clothing that is easily removed.  Leave jewelry and other valuables at home.  However, you may wish to bring a book to read or an  iPod/MP3 player to listen to music as you wait for your procedure to start.  Remove all body piercing jewelry and leave at home.  Total time from sign-in until discharge is approximately 2-3 hours.  You should go home directly after your procedure and rest.  You can resume normal activities the day after your procedure.  The day of your procedure you should not:   Drive   Make legal decisions   Operate machinery   Drink alcohol   Return to work  You will receive specific instructions about eating, activities and medications before you leave.   The above instructions have been reviewed and explained to me by   _______________________    I fully understand and can verbalize these instructions _____________________________ Date _______

## 2010-04-24 NOTE — Medication Information (Signed)
Summary: [email or web address].mp  Anticoagulant Therapy  Managed by: Bethena Midget, RN, BSN Referring MD: [name] PCP: [name], MD Supervising MD: Eden Emms MD, Theron Arista Indication 1: Atrial Fibrillation (ICD-427.31) Indication 2:  ---- Lab Used: [facility] Site: [address] INR POC 3.0 INR RANGE 2-3  Dietary changes: no    Health status changes: no    Bleeding/hemorrhagic complications: no    Recent/future hospitalizations: no    Any changes in medication regimen? no    Recent/future dental: no  Any missed doses?: no       Is patient compliant with meds? yes       Allergies: 1)  Tetracycline Hcl (Tetracycline Hcl) 2)  Aspirin (Aspirin)  Anticoagulation Management History:      The patient is taking warfarin and comes in today for a routine follow up visit.  Positive risk factors for bleeding include presence of serious comorbidities.  Negative risk factors for bleeding include an age less than 102 years old.  The bleeding index is 'intermediate risk'.  Positive CHADS2 values include History of CHF, History of HTN, and History of Diabetes.  Negative CHADS2 values include Age > 32 years old.  The start date was 10/23/2003.  His last INR was 1.8.  Anticoagulation responsible provider: Eden Emms MD, Theron Arista.  INR POC: 3.0.  Cuvette Lot#: [id number].  Exp: 07/2010.    Anticoagulation Management Assessment/Plan:      The patient's current anticoagulation dose is Coumadin 2.5 mg tabs: Use as directed per coumadin clinic.  The target INR is 2.0-3.0.  The next INR is due 06/26/2009.  Anticoagulation instructions were given to patient.  Results were reviewed/authorized by Bethena Midget, RN, BSN.  He was notified by Bethena Midget, RN, BSN.         Prior Anticoagulation Instructions: INR 2.4  Take 2 tabs each Monday, Wednesday, Friday and 1 tab daily.  Recheck 1 week. Call with problems in the meantime.    Current Anticoagulation Instructions: INR 3.0 Change dose 2.5mg s daily except 5mg s on  Mondays,  Per [name]. Recheck in 7-10 days.

## 2010-04-24 NOTE — Letter (Signed)
Summary: Patient Notice- Polyp Results  [facility]  [address] [place], [place] [address]   Phone: [phone]  Fax: [phone]        May 03, 2009 MRN: [id number]    [facility] [address] [place], [place]  [address]    Dear Mr. Grabert,  I am pleased to inform you that the colon polyp(s) removed during your recent colonoscopy was (were) found to be benign (no cancer detected) upon pathologic examination.  I recommend you have a repeat colonoscopy examination in 3_ years to look for recurrent polyps, as having colon polyps increases your risk for having recurrent polyps or even colon cancer in the future.  Should you develop new or worsening symptoms of abdominal pain, bowel habit changes or bleeding from the rectum or bowels, please schedule an evaluation with either your primary care physician or with me.  Additional information/recommendations:  __ No further action with gastroenterology is needed at this time. Please      follow-up with your primary care physician for your other healthcare      needs.  __ Please call [phone] to schedule a return visit to review your      situation.  __ Please keep your follow-up visit as already scheduled.  _x_ Continue treatment plan as outlined the day of your exam.  Please call us if you are having persistent problems or have questions about your condition that have not been fully answered at this time.  Sincerely,  Louis Meckel MD  This letter has been electronically signed by your physician.  Appended Document: Patient Notice- Polyp Results letter mailed 2.11.11

## 2010-04-24 NOTE — Progress Notes (Signed)
Summary: REFILL REQUEST ([facility])  Phone Note Refill Request Message from:  Fax from Pharmacy on Aug 14, 2009 8:43 AM  Refills Requested: Medication #1:  ADVAIR DISKUS 250-50 MCG/DOSE MISC Inhale 1 puff as directed twice a day   Notes: Qty-3 disk.... [facility]... Fax # [phone].    Initial call taken by: Debbra Riding,  Aug 14, 2009 8:44 AM    Prescriptions: ADVAIR DISKUS 250-50 MCG/DOSE MISC (FLUTICASONE-SALMETEROL) Inhale 1 puff as directed twice a day  #3 x 6   Entered by:   Duard Brady LPN   Authorized by:   Gordy Savers  MD   Signed by:   Duard Brady LPN on 09/81/1914   Method used:   Faxed to ...       [facility] (retail)       [address] [place], [place]  [address]       Ph: [phone]       Fax: [phone]   RxID:   [id number]

## 2010-04-24 NOTE — Progress Notes (Signed)
Summary: Temazepam refill - where  Phone Note Refill Request Message from:  Fax from Pharmacy on April 14, 2010 5:22 PM  Refills Requested: Medication #1:  TEMAZEPAM 30 MG CAPS 1 by mouth qhs [facility]    Method Requested: Fax to Local Pharmacy Initial call taken by: Duard Brady LPN,  April 14, 2010 5:22 PM  Follow-up for Phone Call        attempt to call hm# - ans mach - LMTCB , temazepam was [facility] [address] 04/07/10 . this is a controlled substance and I cant call [facility] until I talk with pt about where he will be filling meds. Needs to be filled at the same pharmacy every time. KIK Follow-up by: Duard Brady LPN,  April 14, 2010 5:25 PM

## 2010-04-24 NOTE — Progress Notes (Signed)
Summary: Come by office/pt approved to hold coumadin   Phone Note Outgoing Call Call back at [facility] Phone [phone]   Call placed by: Merri Ray CMA Duncan Dull),  April 23, 2009 9:49 AM Summary of Call: Called pt to make sure he knows to come by here and get new instructions for procedure. He also is going by his cardiologist office before he comes here. Still need coumadin clearance Initial call taken by: Merri Ray CMA (AAMA),  April 23, 2009 9:50 AM  Follow-up for Phone Call        Pt approved to come off coumadin per Dr Amador Cunas. See Pt instructions from [facility] from Dr Amador Cunas Follow-up by: Merri Ray CMA Duncan Dull),  April 23, 2009 3:42 PM  Additional Follow-up for Phone Call Additional follow up Details #1::        Pt just came over to get his new Miralax instructions. Explained all new instructions. Additional Follow-up by: Merri Ray CMA Duncan Dull),  April 23, 2009 5:01 PM

## 2010-04-24 NOTE — Progress Notes (Signed)
Summary: Coumadin samples  Phone Note Call from Patient   Caller: Patient Call For: Coumadin Clinic Summary of Call: Pt called stating he has ran out of coumadin and does not have the money at the present to get rx refilled.  Pt requested samples of coumadin.  Pt is normally on 2.5mg  tablets.  We only have 5mg  samples.  Advised pt of mg diffence and wrote instruction sheet for pt to take 2.5mg  (1/2 tablet) daily except 5mg  (1 tablet) on Mondays and Fridays and left with samples at the front desk for pt to pick-up. Initial call taken by: Cloyde Reams RN,  July 01, 2009 12:05 PM    New/Updated Medications: COUMADIN 5 MG TABS (WARFARIN SODIUM) Take as directed by coumadin clinic. Prescriptions: COUMADIN 5 MG TABS (WARFARIN SODIUM) Take as directed by coumadin clinic.  #20 x 0   Entered and Authorized by:   Cloyde Reams RN   Signed by:   Cloyde Reams RN on 07/01/2009   Method used:   Samples Given   RxID:   [id number]  lot # : [id number] exp: 03/2011

## 2010-04-24 NOTE — Medication Information (Signed)
Summary: List of patient's Medications  List of patient's Medications   Imported By: Maryln Gottron 06/05/2009 10:06:47  _____________________________________________________________________  External Attachment:    Type:   Image     Comment:   External Document

## 2010-04-24 NOTE — Medication Information (Signed)
Summary: rov.mp  Anticoagulant Therapy  Managed by: Cloyde Reams, RN, BSN Referring MD: [name] PCP: [name], MD Supervising MD: Juanda Chance MD, Bruce Indication 1: Atrial Fibrillation (ICD-427.31) Indication 2:  ---- Lab Used: [facility] Site: [address] INR POC 2.4 INR RANGE 2-3  Dietary changes: no    Health status changes: yes       Details: hospitalized.  Bleeding/hemorrhagic complications: no    Recent/future hospitalizations: no    Any changes in medication regimen? yes       Details: noted in previous md visits since d/c.Marland Kitchenpending bloodwork today  Recent/future dental: no  Any missed doses?: yes     Details: see below  Is patient compliant with meds? yes      Comments: states that he was d/cd home on 2 tabs daily to [place] (no records available).  Thinks he's taken 2 tabs some days, and has missed some days.  verbalizes that he was supposed to be here sooner, but has been busy with other MD appts.  mep  Allergies: 1)  Tetracycline Hcl (Tetracycline Hcl) 2)  Aspirin (Aspirin)  Anticoagulation Management History:      The patient is taking warfarin and comes in today for a routine follow up visit.  Positive risk factors for bleeding include presence of serious comorbidities.  Negative risk factors for bleeding include an age less than 56 years old.  The bleeding index is 'intermediate risk'.  Positive CHADS2 values include History of CHF, History of HTN, and History of Diabetes.  Negative CHADS2 values include Age > 71 years old.  The start date was 10/23/2003.  His last INR was 1.8.  Anticoagulation responsible provider: Juanda Chance MD, Smitty Cords.  INR POC: 2.4.  Cuvette Lot#: [id number].  Exp: 07/2010.    Anticoagulation Management Assessment/Plan:      The patient's current anticoagulation dose is Coumadin 2.5 mg tabs: Use as directed per coumadin clinic.  The target INR is 2.0-3.0.  The next INR is due 06/19/2009.  Anticoagulation instructions were given to  patient.  Results were reviewed/authorized by Cloyde Reams, RN, BSN.  He was notified by [name] PharmD, BCPS, CPP.         Prior Anticoagulation Instructions: INR 2.1  Continue on same dosage 1 tablet daily except 2 tablets on Mondays, Wednesdays, and Fridays.  Recheck in 4 weeks.    Current Anticoagulation Instructions: INR 2.4  Take 2 tabs each Monday, Wednesday, Friday and 1 tab daily.  Recheck 1 week. Call with problems in the meantime.

## 2010-04-24 NOTE — Progress Notes (Signed)
Summary: refill aciphex and spiriva  Phone Note Refill Request Message from:  Fax from Pharmacy on December 05, 2009 10:25 AM  Refills Requested: Medication #1:  ACIPHEX 20 MG TBEC take one tab daily  Medication #2:  SPIRIVA HANDIHALER 18 MCG CAPS qd [facility]    Method Requested: Fax to Local Pharmacy Initial call taken by: Duard Brady LPN,  December 05, 2009 10:25 AM    Prescriptions: SPIRIVA HANDIHALER 18 MCG CAPS (TIOTROPIUM BROMIDE MONOHYDRATE) qd  #90 caps x 2   Entered by:   Duard Brady LPN   Authorized by:   Gordy Savers  MD   Signed by:   Duard Brady LPN on 16/12/9602   Method used:   Faxed to ...       [facility] (retail)       [address] [place], [place]  [address]       Ph: [phone]       Fax: [phone]   RxID:   [id number] ACIPHEX 20 MG TBEC (RABEPRAZOLE SODIUM) take one tab daily  #90 x 2   Entered by:   Duard Brady LPN   Authorized by:   Gordy Savers  MD   Signed by:   Duard Brady LPN on 01/17/2535   Method used:   Faxed to ...       [facility] Department (retail)       [address] [place], [place]  [address]       Ph: [phone]       Fax: [phone]   RxID:   [id number]  faxed to health dept. KIK

## 2010-04-24 NOTE — Medication Information (Signed)
Summary: rov/tm  Anticoagulant Therapy  Managed by: Weston Brass, PharmD Referring MD: [name] PCP: [name], MD Supervising MD: [name] MD, Sharlot Gowda Indication 1: Atrial Fibrillation (ICD-427.31) Indication 2:  ---- Lab Used: [facility] Site: [address] INR POC 2.2 INR RANGE 2-3  Dietary changes: no    Health status changes: no    Bleeding/hemorrhagic complications: no    Recent/future hospitalizations: no    Any changes in medication regimen? no    Recent/future dental: no  Any missed doses?: yes     Details: may have missed a dose but unsure   Is patient compliant with meds? yes       Allergies: 1)  Tetracycline Hcl (Tetracycline Hcl) 2)  Aspirin (Aspirin)  Anticoagulation Management History:      The patient is taking warfarin and comes in today for a routine follow up visit.  Positive risk factors for bleeding include presence of serious comorbidities.  Negative risk factors for bleeding include an age less than 76 years old.  The bleeding index is 'intermediate risk'.  Positive CHADS2 values include History of CHF, History of HTN, and History of Diabetes.  Negative CHADS2 values include Age > 54 years old.  The start date was 10/23/2003.  His last INR was 1.8.  Anticoagulation responsible provider: [name] MD, Sharlot Gowda.  INR POC: 2.2.  Cuvette Lot#: [id number].  Exp: 10/2010.    Anticoagulation Management Assessment/Plan:      The patient's current anticoagulation dose is Coumadin 5 mg tabs: Take as directed by coumadin clinic..  The target INR is 2.0-3.0.  The next INR is due 08/14/2009.  Anticoagulation instructions were given to patient.  Results were reviewed/authorized by Weston Brass, PharmD.  He was notified by Weston Brass PharmD.         Prior Anticoagulation Instructions: INR 1.3 Today Take 1 1/2 pill on Thursday take 1 pill then resume 1/2 pill everyday except 1 pill on Mondays, Wednesdays and Fridays. Recheck in 10-12 days.   Current Anticoagulation  Instructions: INR 2.2  Continue same dose of 1/2 tablet every day except 1 tablet on Monday, Wednesday and Friday

## 2010-04-24 NOTE — Miscellaneous (Signed)
  Clinical Lists Changes  Observations: Added new observation of ECHOINTERP:  - Left ventricle: Difficult to evaluate LV function as apical window     is foreshortened. Overall LVEF is at least moderately depressed     with diffuse hypokinesis, worse in the anterior, lateraland septal     walls. The cavity size was normal. Wall thickness was increased in     a pattern of mild LVH.   - Mitral valve: Mild regurgitation.   - Left atrium: The atrium was moderately dilated.   - Right ventricle: RV free wall is difficult to see. RVEFis probably     mildly depressed. The cavity size was mildly dilated.   - Right atrium: The atrium was moderately to severely dilated.   - Pulmonary arteries: PA peak pressure: 57mm Hg (S).   - Pericardium, extracardiac: A trivial pericardial effusion was     identified. (05/03/2009 11:47)      Echocardiogram  Procedure date:  05/03/2009  Findings:       - Left ventricle: Difficult to evaluate LV function as apical window     is foreshortened. Overall LVEF is at least moderately depressed     with diffuse hypokinesis, worse in the anterior, lateraland septal     walls. The cavity size was normal. Wall thickness was increased in     a pattern of mild LVH.   - Mitral valve: Mild regurgitation.   - Left atrium: The atrium was moderately dilated.   - Right ventricle: RV free wall is difficult to see. RVEFis probably     mildly depressed. The cavity size was mildly dilated.   - Right atrium: The atrium was moderately to severely dilated.   - Pulmonary arteries: PA peak pressure: 57mm Hg (S).   - Pericardium, extracardiac: A trivial pericardial effusion was     identified.

## 2010-04-24 NOTE — Medication Information (Signed)
Summary: rov/tm  Anticoagulant Therapy  Managed by: Weston Brass, PharmD Referring MD: [name] PCP: [name], MD Supervising MD: Shirlee Latch MD, Freida Busman Indication 1: Atrial Fibrillation (ICD-427.31) Indication 2:  ---- Lab Used: [facility] Site: [address] INR POC 1.9 INR RANGE 2-3  Dietary changes: no    Health status changes: no    Bleeding/hemorrhagic complications: no    Recent/future hospitalizations: no    Any changes in medication regimen? no    Recent/future dental: no  Any missed doses?: yes     Details: may have missed a dose but not 100% sure   Is patient compliant with meds? yes      Comments: Pt is working to try to get medications through the health department.  He should know by the end of the week if they will be able to give him the medications.  Gave #10 of Coumadin 5mg  Lot: [id number] Exp: 03/2011  Allergies: 1)  Tetracycline Hcl (Tetracycline Hcl) 2)  Aspirin (Aspirin)  Anticoagulation Management History:      The patient is taking warfarin and comes in today for a routine follow up visit.  Positive risk factors for bleeding include presence of serious comorbidities.  Negative risk factors for bleeding include an age less than 49 years old.  The bleeding index is 'intermediate risk'.  Positive CHADS2 values include History of CHF, History of HTN, and History of Diabetes.  Negative CHADS2 values include Age > 64 years old.  The start date was 10/23/2003.  His last INR was 1.8.  Anticoagulation responsible provider: Shirlee Latch MD, Tashina Credit.  INR POC: 1.9.  Cuvette Lot#: [id number].  Exp: 07/2010.    Anticoagulation Management Assessment/Plan:      The patient's current anticoagulation dose is Coumadin 5 mg tabs: Take as directed by coumadin clinic..  The target INR is 2.0-3.0.  The next INR is due 07/24/2009.  Anticoagulation instructions were given to patient.  Results were reviewed/authorized by Weston Brass, PharmD.  He was notified by Weston Brass PharmD.          Prior Anticoagulation Instructions: INR 1.6 Today take 2pills then change dose to 1 pill everyday except 2 pills on Mondays and Fridays.   Current Anticoagulation Instructions: INR 1.9  Take 2.5mg  every day except 5mg  on Monday, Wednesday and Friday

## 2010-04-24 NOTE — Medication Information (Signed)
Summary: rov/sp  Anticoagulant Therapy  Managed by: Bethena Midget, RN, BSN Referring MD: [name] PCP: [name], MD Supervising MD: [name] MD, Arlys John Indication 1: Atrial Fibrillation (ICD-427.31) Indication 2:  ---- Lab Used: [facility] Site: [address] INR POC 1.3 INR RANGE 2-3  Dietary changes: no    Health status changes: no    Bleeding/hemorrhagic complications: no    Recent/future hospitalizations: no    Any changes in medication regimen? no    Recent/future dental: no  Any missed doses?: yes     Details: Pt. states he missed last night dose and another one over weekend  Is patient compliant with meds? yes       Allergies: 1)  Tetracycline Hcl (Tetracycline Hcl) 2)  Aspirin (Aspirin)  Anticoagulation Management History:      The patient is taking warfarin and comes in today for a routine follow up visit.  Positive risk factors for bleeding include presence of serious comorbidities.  Negative risk factors for bleeding include an age less than 24 years old.  The bleeding index is 'intermediate risk'.  Positive CHADS2 values include History of CHF, History of HTN, and History of Diabetes.  Negative CHADS2 values include Age > 74 years old.  The start date was 10/23/2003.  His last INR was 1.8.  Anticoagulation responsible provider: [name] MD, Arlys John.  INR POC: 1.3.  Cuvette Lot#: [id number].  Exp: 08/2010.    Anticoagulation Management Assessment/Plan:      The patient's current anticoagulation dose is Coumadin 5 mg tabs: Take as directed by coumadin clinic..  The target INR is 2.0-3.0.  The next INR is due 08/05/2009.  Anticoagulation instructions were given to patient.  Results were reviewed/authorized by Bethena Midget, RN, BSN.  He was notified by Bethena Midget, RN, BSN.         Prior Anticoagulation Instructions: INR 1.9  Take 2.5mg  every day except 5mg  on Monday, Wednesday and Friday   Current Anticoagulation Instructions: INR 1.3 Today Take 1  1/2 pill on Thursday take 1 pill then resume 1/2 pill everyday except 1 pill on Mondays, Wednesdays and Fridays. Recheck in 10-12 days.

## 2010-04-24 NOTE — Procedures (Signed)
Summary: Colonoscopy  Patient: [name] Note: All result statuses are Final unless otherwise noted.  Tests: (1) Colonoscopy (COL)   COL Colonoscopy           DONE     [facility]     [address]     [place], [place]  [address]           COLONOSCOPY PROCEDURE REPORT           PATIENT:  [name], [name]  MR#:  [id number]     BIRTHDATE:  1945-06-06, 63 yrs. old  GENDER:  male           ENDOSCOPIST:  Barbette Hair. Arlyce Dice, MD     Referred by:           PROCEDURE DATE:  04/30/2009     PROCEDURE:  Colonoscopy with snare polypectomy     ASA CLASS:  Class II     INDICATIONS:  Routine Risk Screening           MEDICATIONS:   Fentanyl 50 mcg IV, Versed 7 mg IV           DESCRIPTION OF PROCEDURE:   After the risks benefits and     alternatives of the procedure were thoroughly explained, informed     consent was obtained.  Digital rectal exam was performed and     revealed no abnormalities.   The LB [id number] endoscope     was introduced through the anus and advanced to the cecum, which     was identified by both the appendix and ileocecal valve, without     limitations.  The quality of the prep was good, using MoviPrep.     The instrument was then slowly withdrawn as the colon was fully     examined.     <<PROCEDUREIMAGES>>           FINDINGS:  A sessile polyp was found in the ascending colon. It     was 1 cm in size. Polyp was snared, then cauterized with monopolar     cautery. Retrieval was successful (see image6). snare polyp  A     sessile polyp was found in the descending colon. It was 4 mm in     size. Polyp was snared without cautery. Retrieval was successful     (see image10). snare polyp  A pedunculated polyp was found in the     sigmoid colon. It was 15 mm in size. It was found 30 cm from the     point of entry. Polyp was snared, then cauterized with monopolar     cautery. Retrieval was successful (see image12). snare polyp  This     was otherwise a normal  examination of the colon (see image1,     image4, image5, image8, image14, image16, and image17).     Retroflexed views in the rectum revealed no abnormalities.    The     scope was then withdrawn from the patient and the procedure     completed.           COMPLICATIONS:  None           ENDOSCOPIC IMPRESSION:     1) 1 cm sessile polyp in the ascending colon     2) 4 mm sessile polyp in the descending colon     3) 15 mm pedunculated polyp in the sigmoid colon     4) Otherwise normal examination     RECOMMENDATIONS:  1) colonoscopy in 3 years     2) resume coumadin in am           REPEAT EXAM:  In 3 year(s) for Colonoscopy.           ______________________________     Barbette Hair. Arlyce Dice, MD           CC:  Gordy Savers, MD           n.     Rosalie Doctor:   Barbette Hair. Eliodoro Gullett at 04/30/2009 10:07 AM           Page 2 of 3   [place], [place], [phone]  Note: An exclamation mark (!) indicates a result that was not dispersed into the flowsheet. Document Creation Date: 04/30/2009 10:08 AM _______________________________________________________________________  (1) Order result status: Final Collection or observation date-time: 04/30/2009 09:41 Requested date-time:  Receipt date-time:  Reported date-time:  Referring Physician:   Ordering Physician: Melvia Heaps [phone]) Specimen Source:  Source: Launa Grill Order Number: [id number] Lab site:   Appended Document: Colonoscopy 3 yr fu     Procedures Next Due Date:    Colonoscopy: 04/2012

## 2010-04-24 NOTE — Medication Information (Signed)
Summary: Patient Assistance Program  Patient Assistance Program   Imported By: Maryln Gottron 06/11/2009 10:46:01  _____________________________________________________________________  External Attachment:    Type:   Image     Comment:   External Document

## 2010-04-24 NOTE — Progress Notes (Signed)
Summary: no show

## 2010-04-24 NOTE — Progress Notes (Signed)
Summary: REFILL REQUEST (Temazepam / Restoril)  Phone Note Refill Request Message from:  Fax from Pharmacy on August 30, 2009 8:13 AM  Refills Requested: Medication #1:  TEMAZEPAM 30 MG CAPSULES  Take 1 capsule by mouth every night at bedtime   Notes: [facility] [address]  Medication on current med list?   Initial call taken by: Debbra Riding,  August 30, 2009 8:16 AM  Follow-up for Phone Call        I dont see this on the current med list - restart? Follow-up by: Duard Brady LPN,  August 30, 2009 8:34 AM  Additional Follow-up for Phone Call Additional follow up Details #1::        #30  RF 1 (generic) Additional Follow-up by: Gordy Savers  MD,  August 30, 2009 11:43 AM    Additional Follow-up for Phone Call Additional follow up Details #2::    called to pharmacy. KIK Follow-up by: Duard Brady LPN,  August 30, 2009 11:53 AM  New/Updated Medications: TEMAZEPAM 30 MG CAPS (TEMAZEPAM) 1 by mouth qhs Prescriptions: TEMAZEPAM 30 MG CAPS (TEMAZEPAM) 1 by mouth qhs  #30 x 1   Entered by:   Duard Brady LPN   Authorized by:   Gordy Savers  MD   Signed by:   Duard Brady LPN on 16/12/9602   Method used:   Historical   RxID:   [id number]

## 2010-04-24 NOTE — Progress Notes (Signed)
Summary: refill toprol  Phone Note Refill Request Message from:  Fax from Pharmacy on September 10, 2009 4:03 PM  Refills Requested: Medication #1:  METOPROLOL SUCCINATE 50 MG XR24H-TAB one daily [facility]   Method Requested: Fax to Local Pharmacy Initial call taken by: Duard Brady LPN,  September 10, 2009 4:04 PM    Prescriptions: METOPROLOL SUCCINATE 50 MG XR24H-TAB (METOPROLOL SUCCINATE) one daily  #90 x 4   Entered by:   Duard Brady LPN   Authorized by:   Gordy Savers  MD   Signed by:   Duard Brady LPN on 13/10/6576   Method used:   Faxed to ...       [facility] (retail)       [address] [place], [place]  [address]       Ph: [phone]       Fax: [phone]   RxID:   [id number]

## 2010-04-24 NOTE — Letter (Signed)
Summary: Patient [facility] Biopsy Results  [facility]  [address] [place], [place] [address]   Phone: [phone]  Fax: [phone]        May 03, 2009 MRN: [id number]    [facility] [address] [place], [place]  [address]    Dear [name],  I am pleased to inform you that the biopsies taken during your recent endoscopic examination did not show any significant evidence of inflammation upon pathologic examination.  Additional information/recommendations:  __No further action is needed at this time.  Please follow-up with      your primary care physician for your other healthcare needs.  __ Please call [phone] to schedule a return visit to review      your condition.  _x_ Continue with the treatment plan as outlined on the day of your      exam.  __ You should have a repeat endoscopic examination for this problem              in _ months/years.   Please call us if you are having persistent problems or have questions about your condition that have not been fully answered at this time.  Sincerely,  Louis Meckel MD  This letter has been electronically signed by your physician.  Appended Document: Patient Notice-Endo Biopsy Results letter mailed 2.11.11

## 2010-04-24 NOTE — Medication Information (Signed)
Summary: rov/cs   Anticoagulant Therapy  Managed by: Weston Brass, PharmD Referring MD: [name] PCP: [name], MD Supervising MD: Antoine Poche MD, Fayrene Fearing Indication 1: Atrial Fibrillation (ICD-427.31) Indication 2:  ---- Lab Used: [facility] Site: [address] INR POC 3.4 INR RANGE 2-3  Dietary changes: no    Health status changes: yes       Details: pt had diarrhea for the last week.  Resolved now  Bleeding/hemorrhagic complications: no    Recent/future hospitalizations: no    Any changes in medication regimen? no    Recent/future dental: no  Any missed doses?: no       Is patient compliant with meds? yes       Allergies: 1)  Tetracycline Hcl (Tetracycline Hcl) 2)  Aspirin (Aspirin)  Anticoagulation Management History:      The patient is taking warfarin and comes in today for a routine follow up visit.  Positive risk factors for bleeding include presence of serious comorbidities.  Negative risk factors for bleeding include an age less than 76 years old.  The bleeding index is 'intermediate risk'.  Positive CHADS2 values include History of CHF, History of HTN, and History of Diabetes.  Negative CHADS2 values include Age > 31 years old.  The start date was 10/23/2003.  His last INR was 1.8.  Anticoagulation responsible provider: Antoine Poche MD, Fayrene Fearing.  INR POC: 3.4.  Cuvette Lot#: [id number].  Exp: 01/2011.    Anticoagulation Management Assessment/Plan:      The patient's current anticoagulation dose is Coumadin 5 mg tabs: Take as directed by coumadin clinic..  The target INR is 2.0-3.0.  The next INR is due 02/20/2010.  Anticoagulation instructions were given to patient.  Results were reviewed/authorized by Weston Brass, PharmD.  He was notified by Weston Brass PharmD.         Prior Anticoagulation Instructions: INR 3.0  Eat an extra servings of greens.  Continue Coumadin as scheduled:  1/2 tablet every day of the week, except 1 tablet on Monday, Wednesday, and  Friday.  Return to clinic in 4 weeks.   Current Anticoagulation Instructions: INR 3.4  Skip tomorrow's dose of Coumadin then resume same dose of 1/2 tablet every day except 1 tablet on Monday, Wednesday and Friday.  Recheck INR in 3 weeks.

## 2010-04-24 NOTE — Medication Information (Signed)
Summary: rov/sp   Anticoagulant Therapy  Managed by: Lyna Poser, PharmD Referring MD: [name] PCP: [name], MD Supervising MD: Eden Emms MD, Theron Arista Indication 1: Atrial Fibrillation (ICD-427.31) Indication 2:  ---- Lab Used: [facility] Site: [address] INR POC 3.3 INR RANGE 2-3  Dietary changes: yes       Details: less greens  Health status changes: no    Bleeding/hemorrhagic complications: no    Recent/future hospitalizations: no    Any changes in medication regimen? no    Recent/future dental: no  Any missed doses?: no       Is patient compliant with meds? yes       Allergies: 1)  Tetracycline Hcl (Tetracycline Hcl) 2)  Aspirin (Aspirin)  Anticoagulation Management History:      The patient is taking warfarin and comes in today for a routine follow up visit.  Positive risk factors for bleeding include presence of serious comorbidities.  Negative risk factors for bleeding include an age less than 8 years old.  The bleeding index is 'intermediate risk'.  Positive CHADS2 values include History of CHF, History of HTN, and History of Diabetes.  Negative CHADS2 values include Age > 10 years old.  The start date was 10/23/2003.  His last INR was 1.8.  Anticoagulation responsible Annai Heick: Eden Emms MD, Theron Arista.  INR POC: 3.3.  Cuvette Lot#: [id number].  Exp: 01/2011.    Anticoagulation Management Assessment/Plan:      The patient's current anticoagulation dose is Coumadin 5 mg tabs: Take as directed by coumadin clinic..  The target INR is 2.0-3.0.  The next INR is due 03/06/2010.  Anticoagulation instructions were given to patient.  Results were reviewed/authorized by Lyna Poser, PharmD.         Prior Anticoagulation Instructions: INR 3.4  Skip tomorrow's dose of Coumadin then resume same dose of 1/2 tablet every day except 1 tablet on Monday, Wednesday and Friday.  Recheck INR in 3 weeks.   Current Anticoagulation Instructions: INR 3.3 Skip your dose  today. Take a half tablet tomorrow. New schedule: take 1 tablet on mondays and fridays. And a half tablet all other days. Recheck in 2 weeks.

## 2010-04-24 NOTE — Progress Notes (Signed)
Summary: SAMPLES OF MED (ACIPHEX)  Phone Note Refill Request Message from:  Patient on December 16, 2009 4:38 PM  Refills Requested: Medication #1:  ACIPHEX 20 MG TBEC take one tab daily   Notes: Pt would like samples if available - pts wife adv he has been sick (n/v) because he hasn't had his medicine..... If no samples are available, send Rx to [facility].  SAMPLES IF AVAILABLE PLEASE   Initial call taken by: Debbra Riding,  December 16, 2009 4:41 PM    Prescriptions: ACIPHEX 20 MG TBEC (RABEPRAZOLE SODIUM) take one tab daily  #90 x 2   Entered by:   Duard Brady LPN   Authorized by:   Gordy Savers  MD   Signed by:   Duard Brady LPN on 95/28/4132   Method used:   Electronically to        [facility] [phone]* (retail)       [address]       [place], [place]  [address]       Ph: [phone]       Fax: [phone]   RxID:   [id number]  no samples availb.- rx [facility] [address] [facility]

## 2010-04-24 NOTE — Assessment & Plan Note (Signed)
Summary: BLOATING--CH    History of Present Illness Visit Type: Initial Visit [name] [facility] Primary Provider: [name], MD Chief Complaint: Abdominal bloating, pain and belching  for several months History of Present Illness:   [name] is a 65 year old white male referred at the request of  [name], M.D. evaluation of abdominal bloating and distention.  He has been complaining of postprandial bloating with excess belching.  He has mild diffuse abdominal discomfort associated with these symptoms.  She denies pyrosis, nausea or vomiting.  Patient has insulin-dependent diabetes mellitus.  He also takes Coumadin because of atrial fibrillation.  He status post implantable defibrillator placement.  Patient has  coronary artery disease which has been stable.    GI Review of Systems    Reports abdominal pain, belching, and  bloating.     Location of  Abdominal pain: generalized.    Denies acid reflux, chest pain, dysphagia with liquids, dysphagia with solids, heartburn, loss of appetite, nausea, vomiting, vomiting blood, weight loss, and  weight gain.      Reports change in bowel habits and  diarrhea.     Denies anal fissure, black tarry stools, constipation, diverticulosis, fecal incontinence, heme positive stool, hemorrhoids, irritable bowel syndrome, jaundice, light color stool, liver problems, rectal bleeding, and  rectal pain.    Current Medications (verified): 1)  Advair Diskus 250-50 Mcg/dose Misc (Fluticasone-Salmeterol) .... Inhale 1 Puff As Directed Twice A Day 2)  Furosemide 40 Mg Tabs (Furosemide) .... Two Tablets Twice Daily 3)  Simvastatin 40 Mg  Tabs (Simvastatin) .Marland Kitchen.. 1 Once Daily 4)  Temazepam 30 Mg Caps (Temazepam) .Marland Kitchen.. 1 At Bedtime As Needed 5)  Tramadol Hcl 50 Mg Tabs (Tramadol Hcl) .Marland Kitchen.. 1 Q6h As Needed 6)  Coumadin 2.5 Mg Tabs (Warfarin Sodium) .... Use As Directed Per Coumadin Clinic 7)  Klor-Con M20 20 Meq  Tbcr (Potassium  Chloride Crys Cr) .Marland Kitchen.. 1 Two Times A Day 8)  Prinivil 10  Mg  Tabs (Lisinopril) .... One Daily 9)  Amiodarone Hcl 200 Mg  Tabs (Amiodarone Hcl) .... One Tablet  A.m. One Half Tablet P.m. 10)  Glyburide-Metformin 2.5-500 Mg  Tabs (Glyburide-Metformin) .... 2 Twice Daily 11)  Humalog Kwikpen 100 Unit/ml Soln (Insulin Lispro (Human)) .Marland Kitchen.. 12 Units Prior To Every Meal 12)  Novofine 32g X 6 Mm Misc (Insulin Pen Needle) .... As Directed 13)  Proair Hfa 108 (90 Base) Mcg/act Aers (Albuterol Sulfate) .... Two Puffs Every 6 Hours For Shortness of Breath 14)  Insulin Syringe/needle 27g X 1/2" 0.5 Ml Misc (Insulin Syringe-Needle U-100) .... Used Twice Daily 15)  Metoprolol Succinate 50 Mg Xr24h-Tab (Metoprolol Succinate) .... One Daily 16)  Multivitamins  Tabs (Multiple Vitamin) .Marland Kitchen.. 1 Tablet By Mouth Once Daily 17)  Q-10 Co-Enzyme 30 Mg Caps (Coenzyme Q10) .Marland Kitchen.. 1 Capsule By Mouth Once Daily  Allergies (verified): 1)  Tetracycline Hcl (Tetracycline Hcl) 2)  Aspirin (Aspirin)  Past History:  Past Medical History: Reviewed history from 07/16/2008 and no changes required. SHOULDER PAIN, RIGHT (ICD-719.41) PSORIASIS (ICD-696.1) ALCOHOL ABUSE, HX OF (ICD-V11.3) OBESITY (ICD-278.00) RENAL INSUFFICIENCY (ICD-588.9) FOOT PAIN, LEFT (ICD-729.5) OTITIS EXTERNA, CHRONIC NEC (ICD-380.23) COPD (ICD-496) HYPERTENSION (ICD-401.9) HYPERLIPIDEMIA (ICD-272.4) DIABETES MELLITUS, TYPE II (ICD-250.00) ATRIAL FIBRILLATION (ICD-427.31) DYSLIPIDEMIA (ICD-272.4) DM (ICD-250.00) CORONARY ARTERY DISEASE (ICD-414.00) ATRIAL FLUTTER (ICD-427.32) CONGESTIVE HEART FAILURE (ICD-428.0)  Past Surgical History: Bilateral Hernia sx Pilonidal cyst Defibrillator Placement Angioplasty  Family History: Reviewed history from 09/20/2006 and no changes required. Family History Other cancer Lung-Mother and  Aunt Fam hx MI Father deceased  age 81 Family History of Diabetes: sister, aunt No FH of Colon Cancer:  Social  History: Reviewed history from 09/20/2006 and no changes required. Married  No Children Current Smoker quit 3 years ago Alcohol use-no Occupation: Nature conservation officer Daily Caffeine Use Illicit Drug Use - no Drug Use:  no  Review of Systems       The patient complains of back pain, shortness of breath, sleeping problems, and swelling of feet/legs.  The patient denies allergy/sinus, anemia, anxiety-new, arthritis/joint pain, blood in urine, breast changes/lumps, change in vision, confusion, cough, coughing up blood, depression-new, fainting, fatigue, fever, headaches-new, hearing problems, heart murmur, heart rhythm changes, itching, muscle pains/cramps, night sweats, nosebleeds, sore throat, swollen lymph glands, thirst - excessive, urination - excessive, urination changes/pain, urine leakage, vision changes, and voice change.         All other systems were reviewed and were negative   Vital Signs:  Patient profile:   65 year old male Height:      66 inches Weight:      218.25 pounds BMI:     35.35 Pulse rate:   88 / minute Pulse rhythm:   regular BP sitting:   136 / 86  (left arm)  Vitals Entered By: [name] NCMA (April 04, 2009 9:28 AM)  Physical Exam  Additional Exam:  He is an obese male in no acute distress  skin: anicteric HEENT: normocephalic; PEERLA; no nasal or pharyngeal abnormalities neck: supple nodes: no cervical lymphadenopathy chest: clear to ausculatation and percussion heart: no murmurs, gallops, or rubs abd: soft, nontender; BS normoactive; no abdominal masses, tenderness, organomegaly; abdomen is mildly tympanitic rectal: deferred ext: no cynanosis, clubbing, edema skeletal: no deformities neuro: oriented x 3; no focal abnormalities    Impression & Recommendations:  Problem # 1:  DYSPEPSIA&OTHER SPEC DISORDERS FUNCTION STOMACH (ICD-536.8)  Upper GI symptoms maybe related to GERD.  He may have generalized hypomotility related to his COPD.   Gastroparesis is also a consideration.  Recommendations #1 begin AcipHex 20 mg daily #2 upper endoscopy #3 to consider gastric emptying  scan pending results  Orders: Colon/Endo (Colon/Endo)  Problem # 2:  SPECIAL SCREENING FOR MALIGNANT NEOPLASMS COLON (ICD-V76.51)  Patient will be scheduled for screening colonoscopy at the same time of his upper endoscopy.  Coumadin will be held in advance of these procedures provided that cardiology consents to this.  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  2)  Conscious Sedation brochure given.  3)  Upper Endoscopy brochure given.  4)  Your colon/endo is scheduled for 04/30/2009 at 9am 5)  You can pick up your MoviPrep from your phamacy today 6)  We will contact you about your coumadin as soon as we hear back from Dr Daleen Squibb. 7)  You have been instructed on your diabetic meds 8)  The medication list was reviewed and reconciled.  All changed / newly prescribed medications were explained.  A complete medication list was provided to the patient / caregiver. Prescriptions: MOVIPREP 100 GM  SOLR (PEG-KCL-NACL-NASULF-NA ASC-C) As per prep instructions.  #1 x 0   Entered by:   [name] CMA (  AAMA)   Authorized by:   Louis Meckel MD   Signed by:   [name] CMA (AAMA) on 04/04/2009   Method used:   Electronically to        [facility] [address] [phone]* (retail)       [address]       [address]       [place], [place]  [address]       Ph: [phone]       Fax: [phone]   RxID:   [id number] ACIPHEX 20 MG TBEC (RABEPRAZOLE SODIUM)  take one tab daily  #30 x 2   Entered and Authorized by:   Louis Meckel MD   Signed by:   [name] CMA (AAMA) on 04/04/2009   Method used:   Electronically to        [facility] [address] [phone]* (retail)       [address]       [address]       [place], [place]  [address]       Ph: [phone]       Fax: [phone]   RxID:   [id number]

## 2010-04-24 NOTE — Miscellaneous (Signed)
Summary: Physician's Orders/[facility]  Physician's Orders/[facility]   Imported By: Maryln Gottron 06/12/2009 10:17:42  _____________________________________________________________________  External Attachment:    Type:   Image     Comment:   External Document

## 2010-04-24 NOTE — Medication Information (Signed)
Summary: [name]  Anticoagulant Therapy  Managed by: Weston Brass, PharmD Referring MD: [name] PCP: [name], MD Supervising MD: Shirlee Latch MD, Freida Busman Indication 1: Atrial Fibrillation (ICD-427.31) Indication 2:  ---- Lab Used: [facility] Site: [address] INR POC 2.4 INR RANGE 2-3  Dietary changes: yes       Details: less vitamin K intake  Health status changes: no    Bleeding/hemorrhagic complications: no    Recent/future hospitalizations: no    Any changes in medication regimen? no    Recent/future dental: no  Any missed doses?: no       Is patient compliant with meds? yes       Allergies: 1)  Tetracycline Hcl (Tetracycline Hcl) 2)  Aspirin (Aspirin)  Anticoagulation Management History:      The patient is taking warfarin and comes in today for a routine follow up visit.  Positive risk factors for bleeding include presence of serious comorbidities.  Negative risk factors for bleeding include an age less than 56 years old.  The bleeding index is 'intermediate risk'.  Positive CHADS2 values include History of CHF, History of HTN, and History of Diabetes.  Negative CHADS2 values include Age > 34 years old.  The start date was 10/23/2003.  His last INR was 1.8.  Anticoagulation responsible provider: Shirlee Latch MD, [place].  INR POC: 2.4.  Cuvette Lot#: [id number].  Exp: 12/2010.    Anticoagulation Management Assessment/Plan:      The patient's current anticoagulation dose is Coumadin 5 mg tabs: Take as directed by coumadin clinic..  The target INR is 2.0-3.0.  The next INR is due 11/07/2009.  Anticoagulation instructions were given to patient.  Results were reviewed/authorized by Weston Brass, PharmD.  He was notified by Weston Brass PharmD.         Prior Anticoagulation Instructions: INR 2.2  Continue same dose of 1/2 tablet every day except 1 tablet on Monday, Wednesday and Friday   Current Anticoagulation Instructions: INR 2.4  Continue same dose of 1/2 tablet  every day except 1 tablet on Monday, Wednesday and Friday.

## 2010-04-24 NOTE — Progress Notes (Signed)
Summary: [facility] - clarification on insulin  Phone Note From Other Clinic   Caller: [facility] Reason for Call: Medication Check Summary of Call: questions r/t lantus , humalog, novalog , cardizem, lasix and fliud noted in [facility] lung field - productive cough , using mucinex Initial call taken by: Duard Brady LPN,  June 07, 2009 5:05 PM  Follow-up for Phone Call        after speaking with Dr. Amador Cunas to review med list as of office vist 3/15 - lantus 20units at bedtime , humalog qik pen 12units with meals three times a day , novalog flexpen for sliding scale as directed for bs over 200 : no cardizem at this time , continue mucinex but make sure pt drinking 8 oz h2o with it.   Follow-up by: Duard Brady LPN,  June 07, 2009 5:08 PM

## 2010-04-24 NOTE — Miscellaneous (Signed)
Summary: Physician's Orders/[facility]  Physician's Orders/[facility]   Imported By: Maryln Gottron 06/12/2009 15:50:47  _____________________________________________________________________  External Attachment:    Type:   Image     Comment:   External Document

## 2010-04-24 NOTE — Assessment & Plan Note (Signed)
Summary: clearance for colonoscopy/per kelly  Medications Added AMIODARONE HCL 200 MG  TABS (AMIODARONE HCL) one half tablet in the morning and one half in the evening.      Allergies Added:   Primary Provider:  [name], MD  CC:  clearance for colonoscopy.  Pt is having difficulty breathing off and on.  Pt does not smoke and does not relate this problem to smoking since he quit 3 yrs ago.  Pt is fatigued a lot.  Marland Kitchen  History of Present Illness: [name] returns today for followup.  He is a pleasant 65 year old man with paroxysmal/persistent atrial fibrillation who has been maintained largely in sinus rhythm on amiodarone therapy.  He also has a nonischemic cardiomyopathy and congestive heart failure class II.  The patient has recently had abdominal fullness and gas and is undergoing evaluation by Dr. Arlyce Dice.  He is scheduled for colonoscopy for 5 days from now.  He has continued to have class 2-3 symptoms and continues to refuse an ICD.  No other symptoms except for the GI complaints as noted above.  Current Medications (verified): 1)  Advair Diskus 250-50 Mcg/dose Misc (Fluticasone-Salmeterol) .... Inhale 1 Puff As Directed Twice A Day 2)  Furosemide 40 Mg Tabs (Furosemide) .... Two Tablets Twice Daily 3)  Simvastatin 40 Mg  Tabs (Simvastatin) .Marland Kitchen.. 1 Once Daily 4)  Temazepam 30 Mg Caps (Temazepam) .Marland Kitchen.. 1 At Bedtime As Needed 5)  Tramadol Hcl 50 Mg Tabs (Tramadol Hcl) .Marland Kitchen.. 1 Q6h As Needed 6)  Coumadin 2.5 Mg Tabs (Warfarin Sodium) .... Use As Directed Per Coumadin Clinic 7)  Klor-Con M20 20 Meq  Tbcr (Potassium Chloride Crys Cr) .Marland Kitchen.. 1 Two Times A Day 8)  Prinivil 10  Mg  Tabs (Lisinopril) .... One Daily 9)  Amiodarone Hcl 200 Mg  Tabs (Amiodarone Hcl) .... One Half Tablet in The Morning and One Half in The Evening. 10)  Glyburide-Metformin 2.5-500 Mg  Tabs (Glyburide-Metformin) .... 2 Twice Daily 11)  Humalog Kwikpen 100 Unit/ml Soln (Insulin Lispro (Human)) .Marland Kitchen.. 12 Units Prior  To Every Meal 12)  Novofine 32g X 6 Mm Misc (Insulin Pen Needle) .... As Directed 13)  Proair Hfa 108 (90 Base) Mcg/act Aers (Albuterol Sulfate) .... Two Puffs Every 6 Hours For Shortness of Breath 14)  Insulin Syringe/needle 27g X 1/2" 0.5 Ml Misc (Insulin Syringe-Needle U-100) .... Used Twice Daily 15)  Metoprolol Succinate 50 Mg Xr24h-Tab (Metoprolol Succinate) .... One Daily 16)  Multivitamins  Tabs (Multiple Vitamin) .Marland Kitchen.. 1 Tablet By Mouth Once Daily 17)  Q-10 Co-Enzyme 30 Mg Caps (Coenzyme Q10) .Marland Kitchen.. 1 Capsule By Mouth Once Daily 18)  Aciphex 20 Mg Tbec (Rabeprazole Sodium) .... Take One Tab Daily 19)  Moviprep 100 Gm  Solr (Peg-Kcl-Nacl-Nasulf-Na Asc-C) .... As Per Prep Instructions. 20)  Miralax   Powd (Polyethylene Glycol 3350) .... As Per Prep  Instructions. 21)  Reglan 10 Mg  Tabs (Metoclopramide Hcl) .... As Per Prep Instructions. 22)  Dulcolax 5 Mg  Tbec (Bisacodyl) .... Day Before Procedure Take 2 At 3pm and 2 At 8pm.  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    Vital Signs:  Patient profile:   65 year old male Height:      66 inches Weight:       218 pounds BMI:     35.31 O2 Sat:      87 % on Room air Pulse rate:   93 / minute Pulse rhythm:   irregular Resp:     16 per minute BP sitting:   132 / 84  (left arm) Cuff size:   large  Vitals Entered By: Judithe Modest CMA (April 26, 2009 3:58 PM)  O2 Flow:  Room air  Physical Exam  General:  overweight-appearing.  no distress.  Blood pressure well controlled Head:  Normocephalic and atraumatic without obvious abnormalities. No apparent alopecia or balding. Eyes:  No corneal or conjunctival inflammation noted. EOMI. Perrla. Funduscopic exam benign, without hemorrhages, exudates or papilledema. Vision grossly normal. Mouth:  Oral mucosa and oropharynx without lesions or exudates.  Teeth in good repair. Neck:  No deformities, masses, or tenderness noted. Lungs:  a few bibasilar rales with out rhonchi. Heart:  an irregular rhythm with a controlled ventricular response Abdomen:  obese soft and nontender Msk:  patient had some erythema, but no fluctuance involving the medial aspect of his left distal thumb Extremities:  2+ left pedal edema and 2+ right pedal edema.     EKG  Procedure date:  04/26/2009  Findings:      Atrial fibrillation with a controlled ventricular response rate of:   Impression & Recommendations:  Problem # 1:  CONGESTIVE HEART FAILURE (ICD-428.0) His symptoms are reasonably but not optimally well controlled.  I have asked him to maintain a low sodium diet. His updated medication list for this problem includes:    Furosemide 40 Mg Tabs (Furosemide) .Marland Kitchen..Marland Kitchen Two tablets twice daily    Coumadin 2.5 Mg Tabs (Warfarin sodium) ..... Use as directed per coumadin clinic    Amiodarone Hcl 200 Mg Tabs (Amiodarone hcl) ..... One half tablet in the morning and one half in the evening.    Metoprolol Succinate 50 Mg Xr24h-tab (Metoprolol succinate) ..... One daily  Problem # 2:  HYPERTENSION (ICD-401.9) His blood pressure today is minimally elevated.  I have asked  him to reduce his sodium intake. His updated medication list for this problem includes:    Furosemide 40 Mg Tabs (Furosemide) .Marland Kitchen..Marland Kitchen Two tablets twice daily    Metoprolol Succinate 50 Mg Xr24h-tab (Metoprolol succinate) ..... One daily  Problem # 3:  ATRIAL FIBRILLATION (ICD-427.31) He has gone back to atrial fib from NSR.  I have asked that he continue his amiodarone for rate control.  Because he is not appreciably worse from a CHF perspective compared to when he is in NSR, I will not plan to proceed with DCCV.  The patient is scheduled for colonoscopy.  I have instructed him to stop his coumadin today and restart it the day after his procedure as he has never had a thromboembolic event. His updated medication list for this problem includes:    Coumadin 2.5 Mg Tabs (Warfarin sodium) ..... Use as directed per coumadin clinic    Amiodarone Hcl 200 Mg Tabs (Amiodarone hcl) ..... One half tablet in the morning and one half in the evening.    Metoprolol Succinate 50 Mg Xr24h-tab (Metoprolol  succinate) ..... One daily  Orders: EKG w/ Interpretation (93000)  Patient Instructions: 1)  STOP COUMADIN 2)  START COUMADIN BACK ON WED. AFTER YOUR COLONOSCOPY AT THE SAME DOSE. 3)  Your physician recommends that you schedule a follow-up appointment in: 6 MONTHS

## 2010-04-24 NOTE — Progress Notes (Signed)
Summary: Triage-Abd.pain, bloating   Phone Note Call from Patient Call back at Home Phone [phone]   Caller: [name] Call For: Dr. Arlyce Dice Reason for Call: Talk to Nurse Summary of Call: Pt wants to talk to you about his procedure he had. Pt. is having abdominal pain and is bloated. Initial call taken by: Karna Christmas,  May 02, 2009 9:10 AM  Follow-up for Phone Call        Pt. had an Endo/Colon on 04-30-09. Pt. c/o abd. swelling, feels very full after just a few bites of food, mid abd.pain, chills yesterday. Denies n/v, no BM since procedure. Has tried Alka-Seltzer and Gas-x, no relief of symptoms.  1) Full liquids only 2) see Amy Esterwood PAC today at 2:30pm 3) If symptoms become worse call back immediately or go to ER.  Follow-up by: Laureen Ochs LPN,  May 02, 2009 9:27 AM

## 2010-04-24 NOTE — Progress Notes (Signed)
Summary: Vitamin D and B12  Phone Note Call from Patient   Summary of Call: Pt's wife would like more labs including a B12 and Vitamin D level on pt. Initial call taken by: Lynann Beaver CMA,  April 30, 2009 8:28 AM  Follow-up for Phone Call        OK Follow-up by: Gordy Savers  MD,  April 30, 2009 9:11 AM  Additional Follow-up for Phone Call Additional follow up Details #1::        scheduled labs. Additional Follow-up by: Lynann Beaver CMA,  April 30, 2009 9:15 AM

## 2010-04-24 NOTE — Medication Information (Signed)
Summary: [name]   Anticoagulant Therapy  Managed by: Weston Brass, PharmD Referring MD: [name] PCP: [name], MD Supervising MD: [name] MD, Sharlot Gowda Indication 1: Atrial Fibrillation (ICD-427.31) Indication 2:  ---- Lab Used: [facility] Site: [address] INR POC 3.0 INR RANGE 2-3  Dietary changes: no    Health status changes: yes       Details: Ear infection  ~ 3 weeks ago.    Bleeding/hemorrhagic complications: no    Recent/future hospitalizations: no    Any changes in medication regimen? no    Recent/future dental: no  Any missed doses?: no       Is patient compliant with meds? yes       Allergies: 1)  Tetracycline Hcl (Tetracycline Hcl) 2)  Aspirin (Aspirin)  Anticoagulation Management History:      The patient is taking warfarin and comes in today for a routine follow up visit.  Positive risk factors for bleeding include presence of serious comorbidities.  Negative risk factors for bleeding include an age less than 10 years old.  The bleeding index is 'intermediate risk'.  Positive CHADS2 values include History of CHF, History of HTN, and History of Diabetes.  Negative CHADS2 values include Age > 30 years old.  The start date was 10/23/2003.  His last INR was 1.8.  Anticoagulation responsible provider: [name] MD, Sharlot Gowda.  INR POC: 3.0.  Cuvette Lot#: [id number].  Exp: 01/2011.    Anticoagulation Management Assessment/Plan:      The patient's current anticoagulation dose is Coumadin 5 mg tabs: Take as directed by coumadin clinic..  The target INR is 2.0-3.0.  The next INR is due 01/30/2010.  Anticoagulation instructions were given to patient.  Results were reviewed/authorized by Weston Brass, PharmD.  He was notified by Haynes Hoehn, PharmD Candidate.         Prior Anticoagulation Instructions: INR 2.7  Continue taking 1/2 tablet everyday except 1 tablet on Mondays, Wednesdays, and Fridays. Re-check INR in 4 weeks.   Current Anticoagulation  Instructions: INR 3.0  Eat an extra servings of greens.  Continue Coumadin as scheduled:  1/2 tablet every day of the week, except 1 tablet on Monday, Wednesday, and Friday.  Return to clinic in 4 weeks.

## 2010-04-24 NOTE — Progress Notes (Signed)
Summary: RETURNING CALL ABOUT MEDICATION  Medications Added AMIODARONE HCL 200 MG  TABS (AMIODARONE HCL) Take one tablet by mouth once daily.       Phone Note Call from Patient   Caller: Patient Summary of Call: PT RETURNING CALL ABOUT MEDICATION Initial call taken by: Judie Grieve,  July 16, 2009 2:44 PM    New/Updated Medications: AMIODARONE HCL 200 MG  TABS (AMIODARONE HCL) Take one tablet by mouth once daily. Prescriptions: AMIODARONE HCL 200 MG  TABS (AMIODARONE HCL) Take one tablet by mouth once daily.  #30 x 3   Entered by:   Laurance Flatten CMA   Authorized by:   Laren Boom, MD, [facility]   Signed by:   Laurance Flatten CMA on 07/18/2009   Method used:   Electronically to        [facility] [address] [phone]* (retail)       [address]       [address]       [place], [place]  [address]       Ph: [phone]       Fax: [phone]   RxID:   [id number]

## 2010-04-24 NOTE — Progress Notes (Signed)
Summary: prep ?'s   Phone Note Call from Patient Call back at Home Phone [phone]   Caller: wife, Bonita Quin Call For: Dr. Arlyce Dice Reason for Call: Talk to Nurse Summary of Call: pt has questions regarding his prep Initial call taken by: Vallarie Mare,  April 29, 2009 2:11 PM  Follow-up for Phone Call        Attempted to return pts phone call x 3.  No answer with no  ID on voice mail.  Will continue to attempt contact with patient. Follow-up by: Jennye Boroughs RN,  April 29, 2009 3:01 PM  Additional Follow-up for Phone Call Additional follow up Details #1::        Spoke with patient and answered questions reguarding clear liquid diet.  Pt verbalized understanding. Additional Follow-up by: Jennye Boroughs RN,  April 29, 2009 3:33 PM

## 2010-04-24 NOTE — Progress Notes (Signed)
Summary: refill proair  Phone Note Refill Request   Refills Requested: Medication #1:  PROAIR HFA 108 (90 BASE) MCG/ACT AERS two puffs every 6 hours for shortness of breath [facility]  fax  [phone]   Method Requested: Fax to Local Pharmacy Initial call taken by: Duard Brady LPN,  September 16, 2009 11:15 AM    Prescriptions: PROAIR HFA 108 (90 BASE) MCG/ACT AERS (ALBUTEROL SULFATE) two puffs every 6 hours for shortness of breath  #3 x 6   Entered by:   Duard Brady LPN   Authorized by:   Gordy Savers  MD   Signed by:   Duard Brady LPN on 09/81/1914   Method used:   Faxed to ...       [facility] Department (retail)       [address] [place], [place]  [address]       Ph: [phone]       Fax: [phone]   RxID:   [id number]

## 2010-04-24 NOTE — Progress Notes (Signed)
  Phone Note Other Incoming   Summary of Call: call from call a nurse and adv home care  pt was just d/c from hospital and cannot afford his meds he is DM and cannot afford his humolog insulin (but was given samples of lantus)- which he usually takes 12 u per nt supposed to have 12 u of humolog pre meal -- but has none and cannot get it  is supposed to meet with  social worker on monday to get help getting his meds fasting sugar this am was 249- and feels fine but this is high for him  Follow-up for Phone Call        since he has the lantus and cannot get the other insulin I adv giving extra 5 u of lantus now  watching sugar frequenly through the day and updating me  also inc eve lantus to 15 u with close supervision call tomorrow to update if no imp watch closely for hypoglycemia - rev s/s f/u with Dr Kirtland Bouchard when able  f/u with social worker on monday also adv to call if fever or any other symptoms  Follow-up by: Judith Part MD,  June 01, 2009 1:31 PM

## 2010-04-24 NOTE — Progress Notes (Signed)
Summary: refill aciphex  Phone Note Refill Request   Refills Requested: Medication #1:  ACIPHEX 20 MG TBEC take one tab daily [facility] fax [phone]   Method Requested: Fax to Local Pharmacy Initial call taken by: Duard Brady LPN,  September 03, 2009 10:18 AM    Prescriptions: ACIPHEX 20 MG TBEC (RABEPRAZOLE SODIUM) take one tab daily  #30 x 2   Entered by:   Duard Brady LPN   Authorized by:   Gordy Savers  MD   Signed by:   Duard Brady LPN on 45/40/9811   Method used:   Faxed to ...       [name] Department (retail)       [address] [place], [place]  [address]       Ph: [phone]       Fax: [phone]   RxID:   [id number]

## 2010-04-24 NOTE — Assessment & Plan Note (Signed)
Summary: per check out/sf  Medications Added NOVOLOG 100 UNIT/ML SOLN (INSULIN ASPART) UAd      Allergies Added:   Visit Type:  Follow-up Primary Provider:  [name], MD   History of Present Illness: [name] returns today for followup.  He is a pleasant middle aged man with an ICM, CHF, and DM.  He was hospitalized with hypoglycemia and altered mental status.  He has undergone extensive diuresis and is improved.  He has been able to keep much of his fluid off without re-accumulation.  Current Medications (verified): 1)  Advair Diskus 250-50 Mcg/dose Misc (Fluticasone-Salmeterol) .... Inhale 1 Puff As Directed Twice A Day 2)  Metolazone 2.5 Mg Tabs (Metolazone) .... Monday Wednesday, and Friday Only 3)  Vytorin 10-40 Mg Tabs (Ezetimibe-Simvastatin) .... Qd 4)  Coumadin 5 Mg Tabs (Warfarin Sodium) .... Take As Directed By Coumadin Clinic. 5)  Klor-Con M20 20 Meq  Tbcr (Potassium Chloride Crys Cr) .Marland Kitchen.. 1 Two Times A Day 6)  Prinivil 10  Mg  Tabs (Lisinopril) .... One Daily 7)  Amiodarone Hcl 200 Mg  Tabs (Amiodarone Hcl) .... Take One Tablet By Mouth Once Daily. 8)  Humalog Kwikpen 100 Unit/ml Soln (Insulin Lispro (Human)) .Marland Kitchen.. 12 Units Prior To Every Meal 9)  Novofine 32g X 6 Mm Misc (Insulin Pen Needle) .... As Directed 10)  Proair Hfa 108 (90 Base) Mcg/act Aers (Albuterol Sulfate) .... Two Puffs Every 6 Hours For Shortness of Breath 11)  Insulin Syringe/needle 27g X 1/2" 0.5 Ml Misc (Insulin Syringe-Needle U-100) .... Used Twice Daily 12)  Metoprolol Succinate 50 Mg Xr24h-Tab (Metoprolol Succinate) .... One Daily 13)  Multivitamins  Tabs (Multiple Vitamin) .Marland Kitchen.. 1 Tablet By Mouth Once Daily 14)  Aciphex 20 Mg Tbec (Rabeprazole Sodium) .... Take One Tab Daily 15)  Lantus Solostar 100 Unit/ml Soln (Insulin Glargine) .... 20 Units At Bedtime 16)  Spiriva Handihaler 18 Mcg Caps (Tiotropium Bromide Monohydrate) .... Qd 17)  Furosemide 80 Mg Tabs (Furosemide) .... 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    Vital Signs:  Patient profile:   65 year old male Height:      66 inches Weight:      187 pounds Pulse rate:   90 / minute BP sitting:   122 / 70  (left arm)  Vitals Entered By: Laurance Flatten CMA (Jul 31, 2009 3:14 PM)  Physical Exam  General:  Diskempt, well developed, well nourished, in no acute distress.  HEENT: normal Neck: supple. 7 cm JVD. Carotids 2+ bilaterally no bruits Cor: RRR no rubs, gallops or murmur Lungs: CTA Ab: soft, nontender. nondistended. No HSM. Good bowel sounds Ext: warm. no cyanosis, clubbing or edema Neuro: alert and oriented. Grossly nonfocal. affect pleasant    Impression & Recommendations:  Problem # 1:  CONGESTIVE HEART FAILURE (ICD-428.00) His symptoms remain class 2.  He will continue his current meds and  maintain a low sodium diet. His updated medication list for this problem includes:    Metolazone 2.5 Mg Tabs (Metolazone) ..... Monday wednesday, and friday only    Coumadin 5 Mg Tabs (Warfarin sodium) .Marland Kitchen... Take as directed by coumadin clinic.    Amiodarone Hcl 200 Mg Tabs  (Amiodarone hcl) .Marland Kitchen... Take one tablet by mouth once daily.    Metoprolol Succinate 50 Mg Xr24h-tab (Metoprolol succinate) ..... One daily    Furosemide 80 Mg Tabs (Furosemide) ..... One twice daily  Problem # 2:  OBESITY (ICD-278.00) He is still trying to reduce his weight.  A low fat diet is discussed.  Also the importance of regular exercise is noted.  Problem # 3:  HYPERTENSION (ICD-401.9) He will continue his current meds and maintain a low sodium diet. His updated medication list for this problem includes:    Metolazone 2.5 Mg Tabs (Metolazone) ..... Monday wednesday, and friday only    Metoprolol Succinate 50 Mg Xr24h-tab (Metoprolol succinate) ..... One daily    Furosemide 80 Mg Tabs (Furosemide) ..... One twice daily  Patient Instructions: 1)  Your physician recommends that you schedule a follow-up appointment in: 6 months with Dr [name]

## 2010-04-24 NOTE — Letter (Signed)
Summary: Generic Letter  [facility]  [address] [place], [place] [address]   Phone: [phone]  Fax: [phone]    04/04/2009  [facility] [address] [place], [place]  [address]  Dear [name],  It is my pleasure to have treated you recently as a new patient in my office. I appreciate your confidence and the opportunity to participate in your care.  Since I do have a busy inpatient endoscopy schedule and office schedule, my office hours vary weekly. I am, however, available for emergency calls everyday through my office. If I am not available for an urgent office appointment, another one of our gastroenterologist will be able to assist you.  My well-trained staff are prepared to help you at all times. For emergencies after office hours, a physician from our Gastroenterology section is always available through my 24 hour answering service  Once again I welcome you as a new patient and I look forward to a happy and healthy relationship               Sincerely,   Jon Heaps MD  Appended Document: Generic Letter letter mailed

## 2010-04-24 NOTE — Progress Notes (Signed)
Summary: refill vytorin  Phone Note Refill Request Message from:  Fax from Pharmacy on September 26, 2009 2:52 PM  Refills Requested: Medication #1:  VYTORIN 10-40 MG TABS qd health dept   Method Requested: Fax to Local Pharmacy Initial call taken by: Duard Brady LPN,  September 26, 2540 2:53 PM    Prescriptions: VYTORIN 10-40 MG TABS (EZETIMIBE-SIMVASTATIN) qd  #90 x 4   Entered by:   Duard Brady LPN   Authorized by:   Gordy Savers  MD   Signed by:   Duard Brady LPN on 70/62/3762   Method used:   Faxed to ...       [facility] Department (retail)       [address] [place], [place]  [address]       Ph: [phone]       Fax: [phone]   RxID:   [id number]

## 2010-04-24 NOTE — Progress Notes (Signed)
Summary: [employer] req status of Diabetes Testing Supp  Phone Note From Other Clinic Call back at [employer] [phone]    Caller: [facility]     - Jon Gills  Summary of Call: Faxed form req pts Diabetes Testing Supplies on Friday 12/13/09. Req status of form. Pls call [phone]   and the fax # is [phone]    Initial call taken by: Lucy Antigua,  December 16, 2009 9:41 AM  Follow-up for Phone Call        attempt to call - ans amch - LMTCB to let me know if he is infact using this supply co for ALL diabetic testing supplies. KIK Follow-up by: Duard Brady LPN,  December 16, 2009 11:04 AM

## 2010-04-24 NOTE — Medication Information (Signed)
Summary: cvrr  Anticoagulant Therapy  Managed by: Cloyde Reams, RN, BSN Referring MD: [name] Supervising MD: Juanda Chance MD, Tonna Palazzi Indication 1: Atrial Fibrillation (ICD-427.31) Indication 2:  ---- Lab Used: [facility] Site: [address] INR POC 1.8 INR RANGE 2-3  Dietary changes: yes       Details: Decr appetite d/t stomach problems.    Health status changes: no    Bleeding/hemorrhagic complications: no    Recent/future hospitalizations: no    Any changes in medication regimen? no    Recent/future dental: no  Any missed doses?: no       Is patient compliant with meds? yes      Comments: Pt has been taking 1 tablet daily except 2 tablets on MF.    Allergies (verified): 1)  Tetracycline Hcl (Tetracycline Hcl) 2)  Aspirin (Aspirin)  Anticoagulation Management History:      The patient is taking warfarin and comes in today for a routine follow up visit.  Positive risk factors for bleeding include presence of serious comorbidities.  Negative risk factors for bleeding include an age less than 66 years old.  The bleeding index is 'intermediate risk'.  Positive CHADS2 values include History of CHF, History of HTN, and History of Diabetes.  Negative CHADS2 values include Age > 21 years old.  The start date was 10/23/2003.  His last INR was 1.8.  Anticoagulation responsible provider: Juanda Chance MD, Smitty Cords.  INR POC: 1.8.  Cuvette Lot#: [id number].  Exp: 04/2010.    Anticoagulation Management Assessment/Plan:      The patient's current anticoagulation dose is Coumadin 2.5 mg tabs: Use as directed per coumadin clinic.  The target INR is 2.0-3.0.  The next INR is due 04/16/2009.  Anticoagulation instructions were given to patient.  Results were reviewed/authorized by Cloyde Reams, RN, BSN.  He was notified by Cloyde Reams RN.         Prior Anticoagulation Instructions: INR 2.2  CONTINUE TAKING 1 TAB EVERYDAY EXCEPT TAKE 2 TABS ON MONDAYS, WEDNESDAYS, AND FRIDAYS.  Current  Anticoagulation Instructions: INR 1.8  Incr dosage to 1 tablet daily except 2 tablets on Mondays, Wednesdays, and Fridays.  Recheck in 3 weeks.

## 2010-04-24 NOTE — Letter (Signed)
Summary: Anticoagulation Modification Letter  [facility]  [address] [place], [place] [address]   Phone: [phone]  Fax: [phone]    April 04, 2009  Re:    [name] [facility] DOB:    13-May-1945 MRN:    [id number]    Dear Dr Valera Castle,   We have scheduled the above patient for an endoscopic procedure. Our records show that  he is on anticoagulation therapy. Please advise as to how long the patient may come off their therapy of coumadin  prior to the scheduled procedure(s) on 04/30/2009.   Please fax back/or route the completed form to Gar Gibbon. at [phone]  Thank you for your help with this matter.  Sincerely,  Merri Ray CMA Duncan Dull)   Physician Recommendation:  Hold Plavix 7 days prior ________________  Hold Coumadin 5 days prior ____________ 04/25/2009   Other ______________________________     Appended Document: Anticoagulation Modification Letter Sent to [name]  Appended Document: Anticoagulation Modification Letter Hi.  Should [name] be evaluated by cardiology prior to clearance?  [name]  Appended Document: Anticoagulation Modification Letter I have not seen in over a year.  If cardiology clearance needed, then he can be seen. If medical clearance ok, then we do not need to see.  Appended Document: Anticoagulation Modification Letter Pt approved to come off coumadin 5 days prior to procedure per Dr Amador Cunas. See Pt instructions from Dr Amador Cunas office visit note.

## 2010-04-24 NOTE — Progress Notes (Signed)
Summary: PT NEEDS REFERRAL TO ENT AND PODIATRY PER WIFE    Phone Note Call from Patient Call back at Home Phone [phone]   Caller: [name] Call For: Jon Savers  [name] Summary of Call: PT NEEDS REFERRAL TO ENT FOR SEVERE EAR PAIN AND PT IS DM NEEDS REFERRAL PODIATRY FOR FOOT PAIN Initial call taken by: Heron Sabins,  November 06, 2009 3:50 PM  Follow-up for Phone Call        patient needs initial evaluation here prior to initiation of any referral Follow-up by: Jon Savers  [name],  November 07, 2009 7:38 AM  Additional Follow-up for Phone Call Additional follow up Details #1::        lmom Additional Follow-up by: Heron Sabins,  November 08, 2009 8:38 AM    Additional Follow-up for Phone Call Additional follow up Details #2::    PT [facility] FOR OV TODAY 11-11-2009 Follow-up by: Heron Sabins,  November 11, 2009 1:10 PM

## 2010-04-24 NOTE — Progress Notes (Signed)
Summary: refill k-tabs  Phone Note Refill Request Message from:  Fax from Pharmacy on September 05, 2009 11:57 AM  Refills Requested: Medication #1:  KLOR-CON M20 20 MEQ  TBCR 1 two times a day   Last Refilled: 07/19/2009 [facility]   [phone]   Method Requested: Fax to Local Pharmacy Initial call taken by: Duard Brady LPN,  September 05, 2009 11:58 AM    Prescriptions: KLOR-CON M20 20 MEQ  TBCR (POTASSIUM CHLORIDE CRYS CR) 1 two times a day  #60 Each x 4   Entered and Authorized by:   Duard Brady LPN   Signed by:   Duard Brady LPN on 21/30/8657   Method used:   Faxed to ...       [facility] Department (retail)       [address] [place], [place]  [address]       Ph: [phone]       Fax: [phone]   RxID:   [id number]

## 2010-04-24 NOTE — Assessment & Plan Note (Signed)
Summary: ABD.PAIN,BLOATING,CHILLS POST ENDO/COLON   (DR.KAPLAN PT)    ...    History of Present Illness Visit Type: Follow-up Visit [name] [facility] Primary Provider: [name], MD Chief Complaint: bloating and abdominal distention following colon endo last night History of Present Illness:   65 YO MALE KNOWN TO [name] WITH MULTIPLE MEDICAL PROBLEMS INCLUDING CAD,CHF,IDDMCOPD,HX ATRIAL FLUTTER,RENAL INSUFFICIENCY,ETOH. HE UNDERWENT EGD  AND COLONOSCOPY WITH POLYPECTOMY ON 05/01/09. HIS FAMILY CALLED IN TODAY STATING HE HAD BEEN FEELING BAD SINCE THE PROCEDURE WITH ABDOMINAL DISTENTION,NOT PASSING FLATUS. ON ARRIVAL TO THE OFFICE HE WAS ALSO C/O SOB,LETHARGY AND HAD TOLD HIS FAMILY HE FELT LIKE HE WAS GOING TO HAVE A HEART ATTACK. HE WAS BRIEFLY EXAMINED,FELT TO BE ACUTELY ILL,THOUGH ABDOMINAL EXAM FAIRLY BENIGN.   GI Review of Systems    Reports abdominal pain, bloating, chest pain, loss of appetite, and  nausea.     Location of  Abdominal pain: lower abdomen.    Denies acid reflux, belching, dysphagia with liquids, dysphagia with solids, heartburn, vomiting, vomiting blood, weight loss, and  weight gain.         Current Medications (verified): 1)  Advair Diskus 250-50 Mcg/dose Misc (Fluticasone-Salmeterol) .... Inhale 1 Puff As Directed Twice A Day 2)  Furosemide 40 Mg Tabs (Furosemide) .... Two Tablets Twice Daily 3)  Simvastatin 40 Mg  Tabs (Simvastatin) .Marland Kitchen.. 1 Once Daily 4)  Temazepam 30 Mg Caps (Temazepam) .Marland Kitchen.. 1 At Bedtime As Needed 5)  Tramadol Hcl 50 Mg Tabs (Tramadol Hcl) .Marland Kitchen.. 1 Q6h As Needed 6)  Coumadin 2.5 Mg Tabs (Warfarin Sodium) .... Use As Directed Per Coumadin Clinic 7)  Klor-Con M20 20 Meq  Tbcr (Potassium Chloride Crys Cr) .Marland Kitchen.. 1 Two Times A Day 8)  Prinivil 10  Mg  Tabs (Lisinopril) .... One Daily 9)  Amiodarone Hcl 200 Mg  Tabs (Amiodarone Hcl) .... One Half Tablet in The Morning and One Half in The Evening. 10)  Glyburide-Metformin  2.5-500 Mg  Tabs (Glyburide-Metformin) .... 2 Twice Daily 11)  Humalog Kwikpen 100 Unit/ml Soln (Insulin Lispro (Human)) .Marland Kitchen.. 12 Units Prior To Every Meal 12)  Novofine 32g X 6 Mm Misc (Insulin Pen Needle) .... As Directed 13)  Proair Hfa 108 (90 Base) Mcg/act Aers (Albuterol Sulfate) .... Two Puffs Every 6 Hours For Shortness of Breath 14)  Insulin Syringe/needle 27g X 1/2" 0.5 Ml Misc (Insulin Syringe-Needle U-100) .... Used Twice Daily 15)  Metoprolol Succinate 50 Mg Xr24h-Tab (Metoprolol Succinate) .... One Daily 16)  Multivitamins  Tabs (Multiple Vitamin) .Marland Kitchen.. 1 Tablet By Mouth Once Daily 17)  Q-10 Co-Enzyme 30 Mg Caps (Coenzyme Q10) .Marland Kitchen.. 1 Capsule By Mouth Once Daily 18)  Aciphex 20 Mg Tbec (Rabeprazole Sodium) .... Take One Tab Daily 19)  Moviprep 100 Gm  Solr (Peg-Kcl-Nacl-Nasulf-Na Asc-C) .... As Per Prep Instructions. 20)  Miralax   Powd (Polyethylene Glycol 3350) .... As Per Prep  Instructions. 21)  Reglan 10 Mg  Tabs (Metoclopramide Hcl) .... As Per Prep Instructions. 22)  Dulcolax 5 Mg  Tbec (Bisacodyl) .... Day Before Procedure Take 2 At 3pm and 2 At 8pm. 23)  Gas-X Ultra Strength 180 Mg Caps (Simethicone) .Marland Kitchen.. 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 Bilateral Hernia sx Pilonidal cyst Defibrillator  Placement Angioplasty  Review of Systems       The patient complains of shortness of breath.  The patient denies allergy/sinus, anemia, anxiety-new, arthritis/joint pain, back pain, blood in urine, breast changes/lumps, change in vision, confusion, cough, coughing up blood, depression-new, fainting, fatigue, fever, headaches-new, hearing problems, heart murmur, heart rhythm changes, itching, menstrual pain, muscle pains/cramps, night sweats, nosebleeds, pregnancy symptoms, skin rash, sleeping problems, sore throat, swelling of feet/legs, swollen lymph glands, thirst - excessive , urination - excessive , urination changes/pain, urine leakage, vision changes, and voice change.    Vital Signs:  Patient profile:   65 year old male Height:      66 inches Weight:      218 pounds BMI:     35.31 BSA:     2.08 Temp:     97.8 degrees F oral Pulse rate:   104 / minute Pulse rhythm:   irregularly irregular Resp:     36 per minute BP supine:   148 / 80  (left arm)  Vitals Entered By: Darcey Nora RN, CGRN (May 02, 2009 3:29 PM)  Physical Exam  General:  Well developed, obese. ,CHRONICALLY ILL APPEARING MALE IN A WHEELCHAIR ,COLOR PALE Head:  Normocephalic and atraumatic. Eyes:  PERRLA, no icterus. Neck:  Supple; no masses or thyromegaly. Lungs:  CRACKLES BILATERAL LOWE LUNG FIELDS Heart:  irregular rhythm:.   Abdomen:  OBESE,PROTUBERANT,BS+, NONTENDER,NOT TYMPANITIC Rectal:  NOT DONE Extremities:  2+ pedal edema.   Neurologic:  lethargic and weakness noted.   Psych:  poor concentration.     Impression & Recommendations:  Problem # 1:  CORONARY ARTERY DISEASE (ICD-414.00) Assessment Deteriorated 65 YO MALE WITH CAD,CHF,COPD,IDDM,WITH ICD ACUTLEY ILL. ADOMINAL EXAM BENIGN POST COLONOSCOPY WITH POLYPECTOMY 05/01/09,THOUGH PROTUBERANT. DO NOT FEEL LIKELY HIS SXS ARE GI,CONCERNED WITH CHF,CARDIAC DECOMPENSATION.   ADVISED PT NEEDS TO BE EVALUATED IN THE  E.R. HIS FAMILY AGREES BUT ADAMANLY  DECLINE AMBULANCE TRANSFER DUE TO COST. HE WAS QUICKLY EVALUATED AND FAMILY ADVISED TO TAKE HIM DIRECTLY TO [facility] ER .HE WAS HEMODYNAMICALLY STABLE.  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

## 2010-04-24 NOTE — Progress Notes (Signed)
Summary: test strips and congestion sx  Phone Note Outgoing Call   Call placed by: Duard Brady LPN,  June 06, 2009 4:47 PM Call placed to: Patient - wife Summary of Call: spoke with wife - we have test strips for them to pick up .  Initial call taken by: Duard Brady LPN,  June 06, 2009 4:50 PM  Follow-up for Phone Call        spoke with wife - she tells me that he isnt feeling well - congestion, worried about pneumonia - I discussed signs , if temp more than 100.0 , sob - to ER . verbalibed understanding.   Follow-up by: Duard Brady LPN,  June 06, 2009 4:57 PM

## 2010-04-24 NOTE — Medication Information (Signed)
Summary: rov/tm      Allergies Added:  Anticoagulant Therapy  Managed by: Samantha Crimes, PharmD Referring MD: [name] PCP: [name], MD Supervising MD: Riley Kill MD, Maisie Fus Indication 1: Atrial Fibrillation (ICD-427.31) Indication 2:  ---- Lab Used: [facility] Site: [address] INR POC 3.1 INR RANGE 2-3  Dietary changes: no    Health status changes: no    Bleeding/hemorrhagic complications: no    Recent/future hospitalizations: no    Any changes in medication regimen? yes       Details: finishing up antibiotic regimen 2 days left  Recent/future dental: no  Any missed doses?: no       Is patient compliant with meds? yes       Current Medications (verified): 1)  Advair Diskus 250-50 Mcg/dose Misc (Fluticasone-Salmeterol) .... Inhale 1 Puff As Directed Twice A Day 2)  Metolazone 2.5 Mg Tabs (Metolazone) .... Monday Wednesday, and Friday Only 3)  Coumadin 5 Mg Tabs (Warfarin Sodium) .... Take As Directed By Coumadin Clinic. 4)  Klor-Con M20 20 Meq  Tbcr (Potassium Chloride Crys Cr) .Marland Kitchen.. 1 Two Times A Day 5)  Prinivil 10  Mg  Tabs (Lisinopril) .... One Daily 6)  Amiodarone Hcl 200 Mg  Tabs (Amiodarone Hcl) .... Take One Tablet By Mouth Once Daily. 7)  Humalog Kwikpen 100 Unit/ml Soln (Insulin Lispro (Human)) .Marland Kitchen.. 12 Units Prior To Every Meal; Add 6 Units If Blood Sugar Over 200 8)  Novofine 32g X 6 Mm Misc (Insulin Pen Needle) .... As Directed 9)  Proair Hfa 108 (90 Base) Mcg/act Aers (Albuterol Sulfate) .... Two Puffs Every 6 Hours For Shortness of Breath 10)  Insulin Syringe/needle 27g X 1/2" 0.5 Ml Misc (Insulin Syringe-Needle U-100) .... Used Twice Daily 11)  Metoprolol Succinate 50 Mg Xr24h-Tab (Metoprolol Succinate) .... One Daily 12)  Multivitamins  Tabs (Multiple Vitamin) .Marland Kitchen.. 1 Tablet By Mouth Once Daily 13)  Aciphex 20 Mg Tbec (Rabeprazole Sodium) .... Take One Tab Daily 14)  Lantus Solostar 100 Unit/ml Soln (Insulin Glargine) .... 20 Units At  Bedtime 15)  Spiriva Handihaler 18 Mcg Caps (Tiotropium Bromide Monohydrate) .... Qd 16)  Furosemide 80 Mg Tabs (Furosemide) .... One Twice Daily 17)  Temazepam 30 Mg Caps (Temazepam) .Marland Kitchen.. 1 By Mouth Qhs 18)  Pravachol 40 Mg Tabs (Pravastatin Sodium) .... One Daily 19)  Neomycin-Polymyxin-Hc 3.5-10000-1 Soln (Neomycin-Polymyxin-Hc) .... 4 Drops To The Right Ear 4 Times Daily 20)  Nortriptyline Hcl 25 Mg Caps (Nortriptyline Hcl) .... One At Bedtime 21)  Hydrocodone-Acetaminophen 5-500 Mg Tabs (Hydrocodone-Acetaminophen) .... One Every 6 Hours As Needed For Pain 22)  Tramadol Hcl 50 Mg Tabs (Tramadol Hcl) .Marland Kitchen.. 1 By Mouth Q6hr As Needed  Allergies (verified): 1)  Tetracycline Hcl (Tetracycline Hcl) 2)  Aspirin (Aspirin)  Anticoagulation Management History:      Positive risk factors for bleeding include presence of serious comorbidities.  Negative risk factors for bleeding include an age less than 74 years old.  The bleeding index is 'intermediate risk'.  Positive CHADS2 values include History of CHF, History of HTN, and History of Diabetes.  Negative CHADS2 values include Age > 36 years old.  The start date was 10/23/2003.  His last INR was 1.8.  Anticoagulation responsible Dael Howland: Riley Kill MD, Maisie Fus.  INR POC: 3.1.  Exp: 04/2011.    Anticoagulation Management Assessment/Plan:      The patient's current anticoagulation dose is Coumadin 5 mg tabs: Take as directed by coumadin clinic..  The target INR is 2.0-3.0.  The next  INR is due 03/28/2010.  Anticoagulation instructions were given to patient.  Results were reviewed/authorized by Samantha Crimes, PharmD.         Prior Anticoagulation Instructions: INR 4.7 Skip today and Fridays dose then change dose to 1/2 pill everyday while on Antibiotic. Recheck in one week.   Current Anticoagulation Instructions: Cont on current regimen Return to clinic on Jan 6th, at 945 am

## 2010-04-24 NOTE — Procedures (Signed)
Summary: Upper Endoscopy  Patient: [name] Note: All result statuses are Final unless otherwise noted.  Tests: (1) Upper Endoscopy (EGD)   EGD Upper Endoscopy       DONE     [facility]     [address]     [place], [place]  [address]           E5NDOSCOPY PROCEDURE REPORT           PATIENT:  [name], [name]  MR#:  [id number]     BIRTHDATE:  [birth year], 63 yrs. old  GENDER:  male           ENDOSCOPIST:  Barbette Hair. Arlyce Dice, MD     Referred by:  [name], M.D.           PROCEDURE DATE:  04/30/2009     PROCEDURE:  EGD with biopsy     ASA CLASS:  Class II     INDICATIONS:  dyspepsia           MEDICATIONS:   There was residual sedation effect present from     prior procedure., Fentanyl 25 mcg IV, Versed 2 mg IV,     glycopyrrolate (Robinal) 0.2 mg IV, 0.6cc simethancone 0.6 cc PO     TOPICAL ANESTHETIC:  Exactacain Spray           DESCRIPTION OF PROCEDURE:   After the risks benefits and     alternatives of the procedure were thoroughly explained, informed     consent was obtained.  The LB GIF-H180 [id number] endoscope was     introduced through the mouth and advanced to the third portion of     the duodenum, without limitations.  The instrument was slowly     withdrawn as the mucosa was fully examined.     <<PROCEDUREIMAGES>>           Moderate gastritis was found in the total stomach. Diffuse mucosal     edema, more pronounced in proximal stomach. Moderate erythema. Bxs     taken (see image1, image2, image3, and image4).  Otherwise the     examination was normal.    Retroflexed views revealed no     abnormalities.    The scope was then withdrawn from the patient     and the procedure completed.           COMPLICATIONS:  None           ENDOSCOPIC IMPRESSION:     1) Moderate gastritis in the total stomach     2) Otherwise normal examination     RECOMMENDATIONS:     1) await biopsy results     2) Nexium 40 mg     3) Call office next 2-3 days to schedule an  office appointment     for 3-4 weeksj     4) resume coumadin in am           REPEAT EXAM:  No           ______________________________     Barbette Hair. Arlyce Dice, MD           CC:           n.     eSIGNED:   Barbette Hair. Kerrilynn Derenzo at 04/30/2009 10:11 AM           Lannie Fields, [id number]  Note: An exclamation mark (!) indicates a result that was not dispersed into the flowsheet. Document Creation Date: 04/30/2009 10:11 AM  _______________________________________________________________________  (1) Order result status: Final Collection or observation date-time: 04/30/2009 09:51 Requested date-time:  Receipt date-time:  Reported date-time:  Referring Physician:   Ordering Physician: Melvia Heaps [phone]) Specimen Source:  Source: Launa Grill Order Number: [id number] Lab site:

## 2010-04-24 NOTE — Medication Information (Signed)
Summary: rov/tm  Anticoagulant Therapy  Managed by: Bethena Midget, RN, BSN Referring MD: [name] PCP: [name], MD Supervising MD: Riley Kill MD, Maisie Fus Indication 1: Atrial Fibrillation (ICD-427.31) Indication 2:  ---- Lab Used: [facility] Site: [address] INR POC 1.6 INR RANGE 2-3  Dietary changes: no    Health status changes: no    Bleeding/hemorrhagic complications: no    Recent/future hospitalizations: no    Any changes in medication regimen? no    Recent/future dental: no  Any missed doses?: yes     Details: yesterday's dose missed  Is patient compliant with meds? yes       Allergies: 1)  Tetracycline Hcl (Tetracycline Hcl) 2)  Aspirin (Aspirin)  Anticoagulation Management History:      The patient is taking warfarin and comes in today for a routine follow up visit.  Positive risk factors for bleeding include presence of serious comorbidities.  Negative risk factors for bleeding include an age less than 36 years old.  The bleeding index is 'intermediate risk'.  Positive CHADS2 values include History of CHF, History of HTN, and History of Diabetes.  Negative CHADS2 values include Age > 10 years old.  The start date was 10/23/2003.  His last INR was 1.8.  Anticoagulation responsible provider: Riley Kill MD, Maisie Fus.  INR POC: 1.6.  Cuvette Lot#: [id number].  Exp: 07/2010.    Anticoagulation Management Assessment/Plan:      The patient's current anticoagulation dose is Coumadin 2.5 mg tabs: Use as directed per coumadin clinic.  The target INR is 2.0-3.0.  The next INR is due 07/10/2009.  Anticoagulation instructions were given to patient.  Results were reviewed/authorized by Bethena Midget, RN, BSN.  He was notified by Bethena Midget, RN, BSN.         Prior Anticoagulation Instructions: INR 3.0 Change dose 2.5mg s daily except 5mg s on Mondays,  Per [name]. Recheck in 7-10 days.   Current Anticoagulation Instructions: INR 1.6 Today take 2pills then change  dose to 1 pill everyday except 2 pills on Mondays and Fridays.

## 2010-04-24 NOTE — Assessment & Plan Note (Signed)
Summary: roa per pt's wife//lh   Vital Signs:  Patient profile:   65 year old male Weight:      215 pounds BMI:     34.83 O2 Sat:      96 % Temp:     98.7 degrees F oral BP sitting:   140 / 80  (right arm) Cuff size:   regular  Vitals Entered By: Raechel Ache, RN (April 23, 2009 11:35 AM) CC: needs clearence to come off coumadin for egd/colo scheduled for next tuesday , left thumb infection , not checking bs daily - 2 days agao in 200's   Primary Care Provider:  [name], MD  CC:  needs clearence to come off coumadin for egd/colo scheduled for next tuesday , left thumb infection , and not checking bs daily - 2 days agao in 200's.  History of Present Illness: 65 year old patient who is seen today for follow-up.  He is anticipating GI evaluation with upper and lower endoscopies next week.  He has been on chronic come in for a permanent atrial for ablation.  He also is a history of congestive heart failure.  His Cardiopulmonary  status has been stable.   he is type 2 diabetes, controlled with oral agents, and mealtime short-acting insulin.  He uses short-acting insulin prior to each meal, but often does not comply with the day meals.  History of hypertension, which has been stable  Allergies: 1)  Tetracycline Hcl (Tetracycline Hcl) 2)  Aspirin (Aspirin)  Past History:  Past Medical History: Reviewed history from 07/16/2008 and no changes required. 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  The patient denies anorexia, fever, weight loss, weight gain, vision loss, decreased hearing, hoarseness, chest pain, syncope, dyspnea on exertion, prolonged cough, headaches, hemoptysis, abdominal pain, melena, hematochezia, severe indigestion/heartburn, hematuria, incontinence, genital sores, muscle weakness, suspicious skin lesions, transient blindness, difficulty walking, depression, unusual weight change, abnormal bleeding, enlarged lymph nodes, angioedema, breast masses, and testicular masses.    Physical Exam  General:  overweight-appearing.  no distress.  Blood pressure well controlled Head:  Normocephalic and atraumatic without obvious abnormalities. No apparent alopecia or balding. Eyes:  No corneal or conjunctival inflammation noted. EOMI. Perrla. Funduscopic exam benign, without hemorrhages, exudates or papilledema. Vision grossly normal. Mouth:  Oral mucosa and oropharynx without lesions or exudates.  Teeth in good repair. Neck:  No deformities, masses, or tenderness noted. Lungs:  a few bibasilar rales O2 saturation 96% Heart:  an irregular rhythm with a controlled ventricular response Abdomen:  obese soft and nontender Extremities:  2+ left pedal edema and 2+ right pedal edema.     Impression & Recommendations:  Problem # 1:  DIABETES MELLITUS, TYPE II (ICD-250.00)  His updated medication list for this problem includes:    Glyburide-metformin 2.5-500 Mg Tabs (Glyburide-metformin) .Marland Kitchen... 2 twice daily    Humalog Kwikpen 100 Unit/ml Soln (Insulin lispro (human)) .Marland Kitchen... 12 units prior to every meal  Orders: TLB-A1C / Hgb A1C (Glycohemoglobin) (83036-A1C)  Problem # 2:  RENAL INSUFFICIENCY (ICD-588.9)  Orders: Venipuncture ([address]) TLB-BMP (Basic Metabolic Panel-BMET) (80048-METABOL) TLB-CBC Platelet - w/Differential (85025-CBCD) TLB-Hepatic/Liver Function Pnl (  80076-HEPATIC)  Problem # 3:  HYPERTENSION (ICD-401.9)  His  updated medication list for this problem includes:    Furosemide 40 Mg Tabs (Furosemide) .Marland Kitchen..Marland Kitchen Two tablets twice daily    Metoprolol Succinate 50 Mg Xr24h-tab (Metoprolol succinate) ..... One daily  Orders: Venipuncture ([address]) TLB-CBC Platelet - w/Differential (85025-CBCD) TLB-Hepatic/Liver Function Pnl (80076-HEPATIC)  Problem # 4:  CONGESTIVE HEART FAILURE (ICD-428.0)  His updated medication list for this problem includes:    Furosemide 40 Mg Tabs (Furosemide) .Marland Kitchen..Marland Kitchen Two tablets twice daily    Coumadin 2.5 Mg Tabs (Warfarin sodium) ..... Use as directed per coumadin clinic    Metoprolol Succinate 50 Mg Xr24h-tab (Metoprolol succinate) ..... One daily  Orders: Venipuncture ([address]) TLB-CBC Platelet - w/Differential (85025-CBCD) TLB-Hepatic/Liver Function Pnl (80076-HEPATIC)  Complete Medication List: 1)  Advair Diskus 250-50 Mcg/dose Misc (Fluticasone-salmeterol) .... Inhale 1 puff as directed twice a day 2)  Furosemide 40 Mg Tabs (Furosemide) .... Two tablets twice daily 3)  Simvastatin 40 Mg Tabs (Simvastatin) .Marland Kitchen.. 1 once daily 4)  Temazepam 30 Mg Caps (Temazepam) .Marland Kitchen.. 1 at bedtime as needed 5)  Tramadol Hcl 50 Mg Tabs (Tramadol hcl) .Marland Kitchen.. 1 q6h as needed 6)  Coumadin 2.5 Mg Tabs (Warfarin sodium) .... Use as directed per coumadin clinic 7)  Klor-con M20 20 Meq Tbcr (Potassium chloride crys cr) .Marland Kitchen.. 1 two times a day 8)  Prinivil 10 Mg Tabs (lisinopril)  .... One daily 9)  Amiodarone Hcl 200 Mg Tabs (Amiodarone hcl) .... One tablet  a.m. one half tablet p.m. 10)  Glyburide-metformin 2.5-500 Mg Tabs (Glyburide-metformin) .... 2 twice daily 11)  Humalog Kwikpen 100 Unit/ml Soln (Insulin lispro (human)) .Marland Kitchen.. 12 units prior to every meal 12)  Novofine 32g X 6 Mm Misc (Insulin pen needle) .... As directed 13)  Proair Hfa 108 (90 Base) Mcg/act Aers (Albuterol sulfate) .... Two puffs every 6 hours for shortness of breath 14)  Insulin Syringe/needle 27g X 1/2" 0.5 Ml Misc (Insulin  syringe-needle u-100) .... Used twice daily 15)  Metoprolol Succinate 50 Mg Xr24h-tab (Metoprolol succinate) .... One daily 16)  Multivitamins Tabs (Multiple vitamin) .Marland Kitchen.. 1 tablet by mouth once daily 17)  Q-10 Co-enzyme 30 Mg Caps (Coenzyme q10) .Marland Kitchen.. 1 capsule by mouth once daily 18)  Aciphex 20 Mg Tbec (Rabeprazole sodium) .... Take one tab daily 19)  Moviprep 100 Gm Solr (Peg-kcl-nacl-nasulf-na asc-c) .... As per prep instructions. 20)  Miralax Powd (Polyethylene glycol 3350) .... As per prep  instructions. 21)  Reglan 10 Mg Tabs (Metoclopramide hcl) .... As per prep instructions. 22)  Dulcolax 5 Mg Tbec (Bisacodyl) .... Day before procedure take 2 at 3pm and 2 at 8pm.  Patient Instructions: 1)  hold Coumadin 5  days prior to your GI procedure and resume Coumadin on the day that your procedures are completed 2)  Please schedule a follow-up appointment in 2 months. 3)  Limit your Sodium (Salt). 4)  It is important that you exercise regularly at least 20 minutes 5 times a week. If you develop chest pain, have severe difficulty breathing, or feel very tired , stop exercising immediately and seek medical attention. 5)  You need to lose weight. Consider a lower calorie diet and regular exercise.  6)  Check your blood sugars regularly. If your readings are usually above : or below 70 you should contact our office. 7)  It is important that your Diabetic A1c level is checked every 3 months. 8)  See your eye doctor yearly to check for diabetic eye  damage.

## 2010-04-24 NOTE — Progress Notes (Signed)
Summary: assist with meds.   Phone Note From Other Clinic   Caller: Nurse Summary of Call: cynthia - nurse from [facility] - out to eval . , long list of meds - requesting any samples we may have , family does not have money for these med. Instructed to send wife over , will look at list , will do what we can , but we can't continuously supply , this would be a 1 time thing. alot of the meds are heart meds and generics and we just dont get samples of. Alos gave order for social worker to eval. . Initial call taken by: Duard Brady LPN,  May 31, 2009 5:23 PM  Follow-up for Phone Call        wife came by - samples given aciphex , vytorin , lantus, needles, glucose monitor , lancets , spiriva and advair. I told her to have nurse call heart doctor and find out if they can assist with any of the heart meds.  Dr. Amador Cunas aware.  Follow-up by: Duard Brady LPN,  May 31, 2009 5:26 PM

## 2010-04-24 NOTE — Progress Notes (Signed)
Summary: sent medication list to [facility].  Phone Note Call from Patient   Caller: Patient Call For: medical records Summary of Call: need list of medications faxed to health dept. please call at home - [phone] Initial call taken by: Duard Brady LPN,  July 08, 2009 1:19 PM  Follow-up for Phone Call        Phone Call Completed Follow-up by: Drue Stager,  July 09, 2009 12:41 PM

## 2010-04-24 NOTE — Progress Notes (Signed)
Summary:  lab question & results  Phone Note Call from Patient Call back at Home Phone [phone]   Caller: Patient-live call Summary of Call: Wife wants his Vit D, all B vitamins, and iron checked if possible added to blood drawn at ov and she wants to know about his cholesterol and all his lab reports.      Initial call taken by: Warnell Forester,  April 24, 2009 1:18 PM    Lab OK  HghA1c controlled; Cholesterol not checked since not fasting  Pt's wife given results.

## 2010-04-24 NOTE — Progress Notes (Signed)
Summary: refill furosemide  Phone Note Refill Request Message from:  Fax from Pharmacy on January 07, 2010 11:23 AM  Refills Requested: Medication #1:  FUROSEMIDE 80 MG TABS one twice daily [facility]   [address]   Method Requested: Fax to Local Pharmacy Initial call taken by: Duard Brady LPN,  January 07, 2010 11:24 AM    Prescriptions: FUROSEMIDE 80 MG TABS (FUROSEMIDE) one twice daily  #180 x 3   Entered by:   Duard Brady LPN   Authorized by:   Gordy Savers  MD   Signed by:   Duard Brady LPN on 04/54/0981   Method used:   Faxed to ...       [facility] [address] [phone]* (retail)       [address]       [address]       [place], [place]  [address]       Ph: [phone]       Fax: [phone]   RxID:   [id number]  [facility]ed to [facility]  [place]

## 2010-04-24 NOTE — Letter (Signed)
Summary: Out of Work  Adult nurse at [employer]  [address]   [place], [place] [address]   Phone: [phone]  Fax: [phone]    Aug 05, 2009   Employee:  ARDITH LEW[employer] [facility]    To Whom It May Concern:   For Medical reasons, please excuse the above named employee from work for the following dates:  Start:   present  End:   08-12-2009  If you need additional information, please feel free to contact our office.         Sincerely,    Gordy Savers  MD

## 2010-04-24 NOTE — Medication Information (Signed)
Summary: Physician's Order for Nebulizer and Supplies  Physician's Order for Nebulizer and Supplies   Imported By: Maryln Gottron 07/30/2009 10:35:59  _____________________________________________________________________  External Attachment:    Type:   Image     Comment:   External Document

## 2010-04-24 NOTE — Progress Notes (Signed)
Summary: O2 dc'd  Phone Note From Other Clinic   Caller: [facility]  Call For: order Summary of Call: Theodoro Grist from [facility] called r/t mr. Anker refusing delivery of O2 - stated he was told he didn't need it any more. If doctor determains he needs - Genevieve Norlander will need a new order for O2 and administration.  fax to [phone] , they also indicated they would need a recent O2 sat reading.  Dr. Amador Cunas to advise Initial call taken by: Duard Brady LPN,  June 06, 2009 12:44 PM  Follow-up for Phone Call        DC oxygen therapy Follow-up by: Gordy Savers  MD,  June 07, 2009 8:06 AM

## 2010-04-24 NOTE — Progress Notes (Signed)
Summary: procedure questions  Medications Added MIRALAX   POWD (POLYETHYLENE GLYCOL 3350) As per prep  instructions. REGLAN 10 MG  TABS (METOCLOPRAMIDE HCL) As per prep instructions. DULCOLAX 5 MG  TBEC (BISACODYL) Day before procedure take 2 at 3pm and 2 at 8pm.       Phone Note Call from Patient Call back at Home Phone [phone]   Caller: wife, Bonita Quin Call For: Dr. Arlyce Dice Reason for Call: Talk to Nurse Summary of Call: 1. wife says she is unsure about pt's instruction with Coumadin in relation to his ECL 2. also states they cannot afford the prep and wants to know if there is something else more affordable or samples  wife added that if she doesnt pick up the first time, call right back Initial call taken by: Vallarie Mare,  April 19, 2009 4:24 PM  Follow-up for Phone Call        Called pt and spoke with wife, Explained to her by the notes that they needed to contact Dr [name] at cardiology for an appointment. She stated she will call first thing on Monday morning. Also pt cant afford MoviPrep, cost over 100$. Explained to pt that I would switch him to Miralax prep and send in today. Pt will be by on Tuesday morning to see me to get new instructions. Follow-up by: Merri Ray CMA Duncan Dull),  April 19, 2009 4:54 PM  Additional Follow-up for Phone Call Additional follow up Details #1::        Will print out new Miralax instructions on Monday Additional Follow-up by: Merri Ray CMA Duncan Dull),  April 19, 2009 5:00 PM    Additional Follow-up for Phone Call Additional follow up Details #2::    Printed out new instructions for pt to pick up on Tuesday Follow-up by: Merri Ray CMA (AAMA),  April 22, 2009 8:37 AM  New/Updated Medications: MIRALAX   POWD (POLYETHYLENE GLYCOL 3350) As per prep  instructions. REGLAN 10 MG  TABS (METOCLOPRAMIDE HCL) As per prep instructions. DULCOLAX 5 MG  TBEC (BISACODYL) Day before procedure take 2 at 3pm and 2 at  8pm. Prescriptions: DULCOLAX 5 MG  TBEC (BISACODYL) Day before procedure take 2 at 3pm and 2 at 8pm.  #4 x 0   Entered by:   Merri Ray CMA (AAMA)   Authorized by:   Louis Meckel MD   Signed by:   Merri Ray CMA (AAMA) on 04/19/2009   Method used:   Electronically to        [facility] [address] [phone]* (retail)       [address]       [address]       [place], [place]  [address]       Ph: [phone]       Fax: [phone]   RxID:   [id number] REGLAN 10 MG  TABS (METOCLOPRAMIDE HCL) As per prep instructions.  #2 x 0   Entered by:   Merri Ray CMA (AAMA)   Authorized by:   Louis Meckel MD   Signed by:   Merri Ray CMA (AAMA) on 04/19/2009   Method used:   Electronically to        [facility] [address] [phone]* (retail)       [address]       [address]       [place], [place]  [address]       Ph: [phone]       Fax: [phone]  RxID:   [id number] MIRALAX   POWD (POLYETHYLENE GLYCOL 3350) As per prep  instructions.  #255gm x 0   Entered by:   Merri Ray CMA (AAMA)   Authorized by:   Louis Meckel MD   Signed by:   Merri Ray CMA (AAMA) on 04/19/2009   Method used:   Electronically to        [facility] [address] [phone]* (retail)       [address]       [address]       [place], [place]  [address]       Ph: [phone]       Fax: [phone]   RxID:   [id number]

## 2010-04-24 NOTE — Letter (Signed)
Summary: Diabetic Instructions  [facility]  [address] [place], [place] [address]   Phone: [phone]  Fax: [phone]    [name] 10-24-1945 MRN: [id number]   X ORAL DIABETIC MEDICATION INSTRUCTIONS  The day before your procedure:   Take your diabetic pill as you do normally  The day of your procedure:   Do not take your diabetic pill    We will check your blood sugar levels during the admission process and again in Recovery before discharging you home  ________________________________________________________________________  _  _   INSULIN (LONG ACTING) MEDICATION INSTRUCTIONS (Lantus, NPH, 70/30, Humulin, Novolin-N)   The day before your procedure:   Take  your regular evening dose    The day of your procedure:   Do not take your morning dose    X  INSULIN (SHORT ACTING) MEDICATION INSTRUCTIONS (Regular, Humulog, Novolog)   The day before your procedure:   Do not take your evening dose   The day of your procedure:   Do not take your morning dose   _  _   INSULIN PUMP MEDICATION INSTRUCTIONS  We will contact the physician managing your diabetic care for written dosage instructions for the day before your procedure and the day of your procedure.  Once we have received the instructions, we will contact you.

## 2010-04-24 NOTE — Miscellaneous (Signed)
Summary: dx correction   Clinical Lists Changes  Problems: Changed problem from CONGESTIVE HEART FAILURE (ICD-428.00) to CONGESTIVE HEART FAILURE (ICD-428.0)  changed the incorrect dx code to correct dx code Genella Mech  January 17, 2010 12:39 PM

## 2010-04-24 NOTE — Assessment & Plan Note (Signed)
Summary: 6 wk rov/njr----PTS WIFE [facility] // RS   Vital Signs:  Patient profile:   65 year old male Weight:      188 pounds Temp:     98.1 degrees F oral BP sitting:   120 / 80  (right arm) Cuff size:   regular  Vitals Entered By: Duard Brady LPN (Aug 05, 2009 4:22 PM) CC: rov - doing ok - having problems getting meds , wants to go back to work      [id number] Is Patient Diabetic? Yes Did you bring your meter with you today? No   Primary Care Provider:  [name], MD  CC:  rov - doing ok - having problems getting meds  and wants to go back to work      [id number].  History of Present Illness: 52 -year-old patient has a history of ischemic cardiomyopathy  and diabetes.  He says that he feels better than he has in some time.  His weight has been stable.  He is somewhat weak, but denies any shortness of breath. Apparently, he is on Lantus 12 units only and not on his recommended 20 units.  He states that fasting blood sugars are often in excess of 300.  He is unsure of his mealtime dosing and was not aware that NovoLog and Humalog insulin  are  interchangeable.  Due to cost concerns.  Samples of both have been dispensed.  He denies any hypoglycemic episodes.  He was seen by cardiology last week and was felt to be euvolemic  Allergies: 1)  Tetracycline Hcl (Tetracycline Hcl) 2)  Aspirin (Aspirin)  Past History:  Past Medical History: Reviewed history from 05/02/2009 and no changes required. 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  The patient denies anorexia, fever, weight loss, weight gain, vision loss, decreased hearing, hoarseness, chest pain, syncope, peripheral edema, prolonged cough, headaches, hemoptysis, abdominal pain, melena, hematochezia, severe indigestion/heartburn, hematuria, incontinence, genital sores, muscle weakness, suspicious skin lesions, transient blindness, difficulty walking, depression, unusual weight change, abnormal bleeding, enlarged lymph nodes, angioedema, breast masses, and testicular masses.    Physical Exam  General:  overweight-appearing.  blood pressure 120/76 Neck:  No deformities, masses, or tenderness noted. Lungs:  Normal respiratory effort, chest expands symmetrically. Lungs are clear to auscultation, no crackles or wheezes. Heart:  controlled ventricular response Extremities:  no clinical edema   Impression & Recommendations:  Problem # 1:  CONGESTIVE HEART FAILURE (ICD-428.00)  His updated medication list for this problem includes:    Metolazone 2.5 Mg Tabs (Metolazone) ..... Monday wednesday, and friday only    Coumadin 5 Mg Tabs (Warfarin sodium) .Marland Kitchen... Take as directed by coumadin clinic.    Metoprolol Succinate 50 Mg Xr24h-tab (Metoprolol succinate) ..... One daily    Furosemide 80 Mg Tabs (Furosemide) ..... One twice daily  His updated medication list for this problem includes:    Metolazone 2.5 Mg Tabs (Metolazone) ..... Monday wednesday, and friday only    Coumadin 5 Mg Tabs (Warfarin sodium) .Marland Kitchen... Take as directed by coumadin clinic.    Metoprolol Succinate 50 Mg Xr24h-tab (Metoprolol succinate) ..... One daily  Furosemide 80 Mg Tabs (Furosemide) ..... One twice daily  Problem # 2:  DIABETES MELLITUS, TYPE II (ICD-250.00)  The following medications were removed from the medication list:    Novolog 100 Unit/ml Soln (Insulin aspart) ..... Uad His updated medication list for  this problem includes:    Humalog Kwikpen 100 Unit/ml Soln (Insulin lispro (human)) .Marland Kitchen... 12 units prior to every meal; add 6 units if blood sugar over 200    Lantus Solostar 100 Unit/ml Soln (Insulin glargine) .Marland Kitchen... 20 units at bedtime  The following medications were removed from the medication list:    Novolog 100 Unit/ml Soln (Insulin aspart) ..... Uad His updated medication list for this problem includes:    Humalog Kwikpen 100 Unit/ml Soln (Insulin lispro (human)) .Marland Kitchen... 12 units prior to every meal; add 6 units if blood sugar over 200    Lantus Solostar 100 Unit/ml Soln (Insulin glargine) .Marland Kitchen... 20 units at bedtime  Complete Medication List: 1)  Advair Diskus 250-50 Mcg/dose Misc (Fluticasone-salmeterol) .... Inhale 1 puff as directed twice a day 2)  Metolazone 2.5 Mg Tabs (Metolazone) .... Monday wednesday, and friday only 3)  Vytorin 10-40 Mg Tabs (Ezetimibe-simvastatin) .... Qd 4)  Coumadin 5 Mg Tabs (Warfarin sodium) .... Take as directed by coumadin clinic. 5)  Klor-con M20 20 Meq Tbcr (Potassium chloride crys cr) .Marland Kitchen.. 1 two times a day 6)  Prinivil 10 Mg Tabs (lisinopril)  .... One daily 7)  Amiodarone Hcl 200 Mg Tabs (Amiodarone hcl) .... Take one tablet by mouth once daily. 8)  Humalog Kwikpen 100 Unit/ml Soln (Insulin lispro (human)) .Marland Kitchen.. 12 units prior to every meal; add 6 units if blood sugar over 200 9)  Novofine 32g X 6 Mm Misc (Insulin pen needle) .... As directed 10)  Proair Hfa 108 (90 Base) Mcg/act Aers (Albuterol sulfate) .... Two puffs every 6 hours for shortness of breath 11)  Insulin Syringe/needle 27g X 1/2" 0.5 Ml Misc (Insulin syringe-needle u-100) .... Used twice daily 12)  Metoprolol Succinate 50 Mg Xr24h-tab (Metoprolol succinate) .... One daily 13)  Multivitamins Tabs (Multiple vitamin) .Marland Kitchen.. 1 tablet by mouth once daily 14)  Aciphex 20 Mg Tbec (Rabeprazole sodium) .... Take one tab daily 15)  Lantus Solostar 100 Unit/ml Soln (Insulin glargine) .... 20 units at  bedtime 16)  Spiriva Handihaler 18 Mcg Caps (Tiotropium bromide monohydrate) .... Qd 17)  Furosemide 80 Mg Tabs (Furosemide) .... One twice daily  Patient Instructions: 1)  Please schedule a follow-up appointment in 6 weeks 2)  Limit your Sodium (Salt). 3)  It is important that you exercise regularly at least 20 minutes 5 times a week. If you develop chest pain, have severe difficulty breathing, or feel very tired , stop exercising immediately and seek medical attention. 4)  You need to lose weight. Consider a lower calorie diet and regular exercise.

## 2010-04-24 NOTE — Progress Notes (Signed)
Summary: Call-A-Nurse Report   Call-A-Nurse Triage Call Report Triage Record Num: [id number] Operator: Sundra Aland Patient Name: [name] Call Date & Time: 05/09/2009 10:12:47PM Patient Phone: [phone] PCP: Gordy Savers Patient Gender: Male PCP Fax : [phone] Patient DOB: 1946/01/12 Practice Name: [facility] Reason for Call: Wife calling and states that husband is having trouble breathing b/c stomach is swelling up; has been doingthis andwas just dicharged from hospital today; BGM 258 when left hospital today; BGM 143 tonight at 10:00pm; wife can not remember the insulin orders that he was instructed to do from the hospital; instructed to look at discharge papers and states that it does't state on dicharge papers what he is to take for his insulin; just walk from bathroom and he is very sob; gasping for breath; oxygen is on and still having diff breathing;crawled up the steps from being short of breath; legs are still very swollen; instructed to call 911 now and get to ER; will comply; will go to [facility] now; instructed that we need to call 911 now and go to [facility]; will comply Protocol(s) Used: Breathing Problems Recommended Outcome per Protocol: Activate EMS 911 Reason for Outcome: Sudden onset of severe breathing difficulty Care Advice: Write down provider's name. List or place the following in a bag for transport with the patient: current prescription and/or OTC medications; alternative treatments, therapies and medications; and street drugs.  ~ If home oxygen has been prescribed, apply it at the recommended flow rate; DO NOT increase the flow rate before consulting provider.  ~  ~ An adult should stay with the patient, preferably one trained in CPR.    Per E-Chart patient admitted to [facility]  05/10/09 at 3:52 am.  Trixie Dredge  May 10, 2009 8:36 AM

## 2010-04-24 NOTE — Miscellaneous (Signed)
  Clinical Lists Changes         Past History:  Past Surgical History: Last updated: 04/04/2009 Bilateral Hernia sx Pilonidal cyst Defibrillator Placement Angioplasty  Family History: Last updated: 04/04/2009 Family History Other cancer Lung-Mother and Aunt Fam hx MI Father deceased  age 65 Family History of Diabetes: sister, aunt No FH of Colon Cancer:  Social History: Last updated: 04/04/2009 Married  No Children Current Smoker quit 3 years ago Alcohol use-no Occupation: Nature conservation officer Daily Caffeine Use Illicit Drug Use - no

## 2010-04-24 NOTE — Medication Information (Signed)
Summary: rov/ewj  Anticoagulant Therapy  Managed by: Cloyde Reams, RN, BSN Referring MD: [name] PCP: [name], MD Supervising MD: Juanda Chance MD, Bruce Indication 1: Atrial Fibrillation (ICD-427.31) Indication 2:  ---- Lab Used: [facility] Site: [address] INR POC 2.1 INR RANGE 2-3  Dietary changes: no    Health status changes: no    Bleeding/hemorrhagic complications: no    Recent/future hospitalizations: yes       Details: Pt going for GI procedure 04/30/09 will need to be off coumadin.    Any changes in medication regimen? no    Recent/future dental: no  Any missed doses?: no       Is patient compliant with meds? yes      Comments: Pending clearance.  Allergies: 1)  Tetracycline Hcl (Tetracycline Hcl) 2)  Aspirin (Aspirin)  Anticoagulation Management History:      The patient is taking warfarin and comes in today for a routine follow up visit.  Positive risk factors for bleeding include presence of serious comorbidities.  Negative risk factors for bleeding include an age less than 67 years old.  The bleeding index is 'intermediate risk'.  Positive CHADS2 values include History of CHF, History of HTN, and History of Diabetes.  Negative CHADS2 values include Age > 53 years old.  The start date was 10/23/2003.  His last INR was 1.8.  Anticoagulation responsible provider: Juanda Chance MD, Smitty Cords.  INR POC: 2.1.  Cuvette Lot#: [id number].  Exp: 06/2010.    Anticoagulation Management Assessment/Plan:      The patient's current anticoagulation dose is Coumadin 2.5 mg tabs: Use as directed per coumadin clinic.  The target INR is 2.0-3.0.  The next INR is due 05/14/2009.  Anticoagulation instructions were given to patient.  Results were reviewed/authorized by Cloyde Reams, RN, BSN.  He was notified by Cloyde Reams RN.         Prior Anticoagulation Instructions: INR 1.8  Incr dosage to 1 tablet daily except 2 tablets on Mondays, Wednesdays, and Fridays.  Recheck in 3  weeks.    Current Anticoagulation Instructions: INR 2.1  Continue on same dosage 1 tablet daily except 2 tablets on Mondays, Wednesdays, and Fridays.  Recheck in 4 weeks.

## 2010-04-24 NOTE — Assessment & Plan Note (Signed)
Summary: severe feet pain/hurts to work/cjr   Vital Signs:  Patient profile:   65 year old male Weight:      196 pounds Temp:     98.2 degrees F oral BP sitting:   110 / 80  (left arm) Cuff size:   regular  Vitals Entered By: Kathrynn Speed CMA (November 11, 2009 2:49 PM) CC: severe feet pain, for years, bottom of feet getting numb, pain in both ears, 2 weeks, src Is Patient Diabetic? Yes   Primary Care Provider:  [name], MD  CC:  severe feet pain, for years, bottom of feet getting numb, pain in both ears, 2 weeks, and src.  History of Present Illness:  65 year old patient who has a long-standing history of poorly controlled diabetes.  He presents with the chief complaint of bilateral feet pain in excess of one year.  He is on Lantus 18 units at bedtime, as well as mealtime insulin.  Blood sugars continued to fluctuate.  No hypoglycemia.  Pain at times is quite painful requiring analgesics. he has chronic atrial fibrillation, and remains on chronic Coumadin.  He has coronary artery disease, which has been stable.  He has treated hypertension, and dyslipidemia he also complains of low worsening right ear pain  Current Medications (verified): 1)  Advair Diskus 250-50 Mcg/dose Misc (Fluticasone-Salmeterol) .... Inhale 1 Puff As Directed Twice A Day 2)  Metolazone 2.5 Mg Tabs (Metolazone) .... Monday Wednesday, and Friday Only 3)  Vytorin 10-40 Mg Tabs (Ezetimibe-Simvastatin) .... Qd 4)  Coumadin 5 Mg Tabs (Warfarin Sodium) .... Take As Directed By Coumadin Clinic. 5)  Klor-Con M20 20 Meq  Tbcr (Potassium Chloride Crys Cr) .Marland Kitchen.. 1 Two Times A Day 6)  Prinivil 10  Mg  Tabs (Lisinopril) .... One Daily 7)  Amiodarone Hcl 200 Mg  Tabs (Amiodarone Hcl) .... Take One Tablet By Mouth Once Daily. 8)  Humalog Kwikpen 100 Unit/ml Soln (Insulin Lispro (Human)) .Marland Kitchen.. 12 Units Prior To Every Meal; Add 6 Units If Blood Sugar Over 200 9)  Novofine 32g X 6 Mm Misc (Insulin Pen Needle) .... As  Directed 10)  Proair Hfa 108 (90 Base) Mcg/act Aers (Albuterol Sulfate) .... Two Puffs Every 6 Hours For Shortness of Breath 11)  Insulin Syringe/needle 27g X 1/2" 0.5 Ml Misc (Insulin Syringe-Needle U-100) .... Used Twice Daily 12)  Metoprolol Succinate 50 Mg Xr24h-Tab (Metoprolol Succinate) .... One Daily 13)  Multivitamins  Tabs (Multiple Vitamin) .Marland Kitchen.. 1 Tablet By Mouth Once Daily 14)  Aciphex 20 Mg Tbec (Rabeprazole Sodium) .... Take One Tab Daily 15)  Lantus Solostar 100 Unit/ml Soln (Insulin Glargine) .... 20 Units At Bedtime 16)  Spiriva Handihaler 18 Mcg Caps (Tiotropium Bromide Monohydrate) .... Qd 17)  Furosemide 80 Mg Tabs (Furosemide) .... One Twice Daily 18)  Temazepam 30 Mg Caps (Temazepam) .Marland Kitchen.. 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  The patient denies anorexia, fever, weight loss, vision loss, decreased hearing, hoarseness, chest pain, syncope, dyspnea on  exertion, prolonged cough, headaches, hemoptysis, abdominal pain, melena, hematochezia, severe indigestion/heartburn, hematuria, incontinence, genital sores, muscle weakness, suspicious skin lesions, transient blindness, difficulty walking, depression, unusual weight change, abnormal bleeding, enlarged lymph nodes, angioedema, breast  masses, and testicular masses.    Physical Exam  General:  overweight-appearing.  normal a pressure Head:  Normocephalic and atraumatic without obvious abnormalities. No apparent alopecia or balding. Eyes:  No corneal or conjunctival inflammation noted. EOMI. Perrla. Funduscopic exam benign, without hemorrhages, exudates or papilledema. Vision grossly normal. Ears:  right canal, inflamed, and edematous Mouth:  Oral mucosa and oropharynx without lesions or exudates.  Teeth in good repair. Neck:  No deformities, masses, or tenderness noted. Lungs:  bibasilar rales Heart:  irregular rhythm with controlled ventricular response Abdomen:  Bowel sounds positive,abdomen soft and non-tender without masses, organomegaly or hernias noted. Extremities:  2+ left pedal edema and 2+ right pedal edema.  2+ left pedal edema.     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 Monday wednesday, and friday only    Metoprolol Succinate 50 Mg Xr24h-tab (Metoprolol succinate) ..... One daily    Furosemide 80 Mg Tabs (Furosemide) ..... One twice daily  His updated medication list for this problem includes:    Metolazone 2.5 Mg Tabs (Metolazone) ..... Monday wednesday, and friday only    Metoprolol Succinate 50 Mg Xr24h-tab (Metoprolol succinate) ..... One daily    Furosemide 80 Mg Tabs (Furosemide) ..... One twice daily  Problem # 3:  DIABETIC PERIPHERAL NEUROPATHY (ICD-250.60)  His updated medication list for this problem includes:    Humalog Kwikpen 100 Unit/ml Soln (Insulin lispro (human)) .Marland Kitchen... 12 units prior to every meal; add 6 units if blood sugar over 200    Lantus Solostar 100 Unit/ml Soln (Insulin glargine) .Marland Kitchen... 20 units at bedtime  His updated medication list for this problem includes:    Humalog Kwikpen 100  Unit/ml Soln (Insulin lispro (human)) .Marland Kitchen... 12 units prior to every meal; add 6 units if blood sugar over 200    Lantus Solostar 100 Unit/ml Soln (Insulin glargine) .Marland Kitchen... 20 units at bedtime  Problem # 4:  DIABETES MELLITUS, TYPE II (ICD-250.00)  His updated medication list for this problem includes:    Humalog Kwikpen 100 Unit/ml Soln (Insulin lispro (human)) .Marland Kitchen... 12 units prior to every meal; add 6 units if blood sugar over 200    Lantus Solostar 100 Unit/ml Soln (Insulin glargine) .Marland Kitchen... 20 units at bedtime  Orders: TLB-A1C / Hgb A1C (Glycohemoglobin) (83036-A1C)  Complete Medication List: 1)  Advair Diskus 250-50 Mcg/dose Misc (Fluticasone-salmeterol) .... Inhale 1 puff as directed twice a day 2)  Metolazone 2.5 Mg Tabs (Metolazone) .... Monday wednesday, and friday only 3)  Coumadin 5 Mg Tabs (Warfarin sodium) .... Take as directed by coumadin clinic. 4)  Klor-con M20 20 Meq Tbcr (Potassium chloride crys cr) .Marland Kitchen.. 1 two times a day 5)  Prinivil 10 Mg Tabs (lisinopril)  .... One daily 6)  Amiodarone Hcl 200 Mg Tabs (Amiodarone hcl) .... Take one tablet by mouth once daily. 7)  Humalog Kwikpen 100 Unit/ml Soln (Insulin lispro (human)) .Marland Kitchen.. 12 units prior to every meal; add 6 units if blood sugar over 200 8)  Novofine 32g X 6 Mm Misc (Insulin pen needle) .... As directed 9)  Proair Hfa 108 (90 Base) Mcg/act Aers (Albuterol  sulfate) .... Two puffs every 6 hours for shortness of breath 10)  Insulin Syringe/needle 27g X 1/2" 0.5 Ml Misc (Insulin syringe-needle u-100) .... Used twice daily 11)  Metoprolol Succinate 50 Mg Xr24h-tab (Metoprolol succinate) .... One daily 12)  Multivitamins Tabs (Multiple vitamin) .Marland Kitchen.. 1 tablet by mouth once daily 13)  Aciphex 20 Mg Tbec (Rabeprazole sodium) .... Take one tab daily 14)  Lantus Solostar 100 Unit/ml Soln (Insulin glargine) .... 20 units at bedtime 15)  Spiriva Handihaler 18 Mcg Caps (Tiotropium bromide monohydrate) .... Qd 16)  Furosemide 80 Mg  Tabs (Furosemide) .... One twice daily 17)  Temazepam 30 Mg Caps (Temazepam) .Marland Kitchen.. 1 by mouth qhs 18)  Pravachol 40 Mg Tabs (Pravastatin sodium) .... One daily 19)  Neomycin-polymyxin-hc 3.5-10000-1 Soln (Neomycin-polymyxin-hc) .... 4 drops to the right ear 4 times daily 20)  Nortriptyline Hcl 25 Mg Caps (Nortriptyline hcl) .... One at bedtime 21)  Hydrocodone-acetaminophen 5-500 Mg Tabs (Hydrocodone-acetaminophen) .... One every 6 hours as needed for pain  Other Orders: Venipuncture ([id number]) TLB-CBC Platelet - w/Differential (85025-CBCD) TLB-BMP (Basic Metabolic Panel-BMET) (80048-METABOL)  Patient Instructions: 1)  Please schedule a follow-up appointment in 1 month. 2)  Limit your Sodium (Salt). 3)  It is important that you exercise regularly at least 20 minutes 5 times a week. If you develop chest pain, have severe difficulty breathing, or feel very tired , stop exercising immediately and seek medical attention. 4)  You need to lose weight. Consider a lower calorie diet and regular exercise.  5)  Check your blood sugars regularly. If your readings are usually above : or below 70 you should contact our office. 6)  It is important that your Diabetic A1c level is checked every 3 months. 7)  See your eye doctor yearly to check for diabetic eye damage. Prescriptions: METOLAZONE 2.5 MG TABS (METOLAZONE) Monday Wednesday, and Friday only  #100 x 0   Entered and Authorized by:   Gordy Savers  MD   Signed by:   Gordy Savers  MD on 11/11/2009   Method used:   Print then Give to Patient   RxID:   [id number] METOLAZONE 2.5 MG TABS (METOLAZONE) Monday Wednesday, and Friday only  #30 x 0   Entered and Authorized by:   Gordy Savers  MD   Signed by:   Gordy Savers  MD on 11/11/2009   Method used:   Print then Give to Patient   RxID:   [id number] HYDROCODONE-ACETAMINOPHEN 5-500 MG TABS (HYDROCODONE-ACETAMINOPHEN) one every 6 hours as needed for pain  #90 x 1    Entered and Authorized by:   Gordy Savers  MD   Signed by:   Gordy Savers  MD on 11/11/2009   Method used:   Print then Give to Patient   RxID:   [id number] NORTRIPTYLINE HCL 25 MG CAPS (NORTRIPTYLINE HCL) one at bedtime  #90 x 1   Entered and Authorized by:   Gordy Savers  MD   Signed by:   Gordy Savers  MD on 11/11/2009   Method used:   Print then Give to Patient   RxID:   [id number] NEOMYCIN-POLYMYXIN-HC 3.5-10000-1 SOLN (NEOMYCIN-POLYMYXIN-HC) 4 drops to the right ear 4 times daily  #30 cc x 1   Entered and Authorized by:   Gordy Savers  MD   Signed by:   Gordy Savers  MD on 11/11/2009   Method used:   Print then Give to Patient  RxID:   [id number] PRAVACHOL 40 MG TABS (PRAVASTATIN SODIUM) one daily  #90 x 5   Entered and Authorized by:   Gordy Savers  MD   Signed by:   Gordy Savers  MD on 11/11/2009   Method used:   Print then Give to Patient   RxID:   [id number]   Appended Document: Orders Update     Clinical Lists Changes  Orders: Added new Service order of Specimen Handling ([id number]) - Signed

## 2010-04-24 NOTE — Progress Notes (Signed)
Summary: rx not in pharmacy   Phone Note Call from Patient Call back at Home Phone [phone]   Caller: Patient Call For: Arlyce Dice Reason for Call: Talk to Nurse Summary of Call: Wife states that her husband had procedure today and rx was not sent to the pharmacy for his stomcah, please send it to [facility] on [address] Initial call taken by: Tawni Levy,  April 30, 2009 4:58 PM  Follow-up for Phone Call        called Mrs. Licht and told her Dr. Arlyce Dice stated that he wanted the patient to continue his aciphex and he did not prescribe nexium. Follow-up by: Domenick Gong. Doss, RN

## 2010-04-24 NOTE — Letter (Signed)
Summary: Handout Printed  Printed Handout:  - Coumadin Instructions-w/out Meds 

## 2010-04-24 NOTE — Medication Information (Signed)
Summary: rov/tm  Anticoagulant Therapy  Managed by: Weston Brass, PharmD Referring MD: [name] PCP: [name], MD Supervising MD: Tenny Craw MD, Gunnar Fusi Indication 1: Atrial Fibrillation (ICD-427.31) Indication 2:  ---- Lab Used: [facility] Site: [address] INR POC 2.7 INR RANGE 2-3  Dietary changes: no    Health status changes: no    Bleeding/hemorrhagic complications: no    Recent/future hospitalizations: no    Any changes in medication regimen? no    Recent/future dental: no  Any missed doses?: no       Is patient compliant with meds? yes       Allergies: 1)  Tetracycline Hcl (Tetracycline Hcl) 2)  Aspirin (Aspirin)  Anticoagulation Management History:      The patient is taking warfarin and comes in today for a routine follow up visit.  Positive risk factors for bleeding include presence of serious comorbidities.  Negative risk factors for bleeding include an age less than 84 years old.  The bleeding index is 'intermediate risk'.  Positive CHADS2 values include History of CHF, History of HTN, and History of Diabetes.  Negative CHADS2 values include Age > 12 years old.  The start date was 10/23/2003.  His last INR was 1.8.  Anticoagulation responsible provider: Tenny Craw MD, Gunnar Fusi.  INR POC: 2.7.  Cuvette Lot#: [id number].  Exp: 12/2010.    Anticoagulation Management Assessment/Plan:      The patient's current anticoagulation dose is Coumadin 5 mg tabs: Take as directed by coumadin clinic..  The target INR is 2.0-3.0.  The next INR is due 01/03/2010.  Anticoagulation instructions were given to patient.  Results were reviewed/authorized by Weston Brass, PharmD.  He was notified by Harrel Carina, PharmD candidate.         Prior Anticoagulation Instructions: INR 2.4  Continue taking Coumadin 0.5 tab (2.5 mg) on Sun, Tues, Thur, Sat and Coumadin 1 tab (5 mg) on Mon, Wed, Fri.  Return to clinic in 4 weeks.   Current Anticoagulation Instructions: INR 2.7  Continue  taking 1/2 tablet everyday except 1 tablet on Mondays, Wednesdays, and Fridays. Re-check INR in 4 weeks.

## 2010-04-24 NOTE — Medication Information (Signed)
Summary: rov/sp  Anticoagulant Therapy  Managed by: Reina Fuse, PharmD Referring MD: [name] PCP: [name], MD Supervising MD: Gala Romney MD, Reuel Boom Indication 1: Atrial Fibrillation (ICD-427.31) Indication 2:  ---- Lab Used: [facility] Site: [address] INR POC 2.4 INR RANGE 2-3  Dietary changes: no    Health status changes: no    Bleeding/hemorrhagic complications: no    Recent/future hospitalizations: no    Any changes in medication regimen? no    Recent/future dental: no  Any missed doses?: yes     Details: Took Coumadin 0.5 tab on a 1 tab day last week.   Is patient compliant with meds? yes       Allergies: 1)  Tetracycline Hcl (Tetracycline Hcl) 2)  Aspirin (Aspirin)  Anticoagulation Management History:      The patient is taking warfarin and comes in today for a routine follow up visit.  Positive risk factors for bleeding include presence of serious comorbidities.  Negative risk factors for bleeding include an age less than 29 years old.  The bleeding index is 'intermediate risk'.  Positive CHADS2 values include History of CHF, History of HTN, and History of Diabetes.  Negative CHADS2 values include Age > 24 years old.  The start date was 10/23/2003.  His last INR was 1.8.  Anticoagulation responsible provider: Bensimhon MD, Reuel Boom.  INR POC: 2.4.  Cuvette Lot#: [id number].  Exp: 12/2010.    Anticoagulation Management Assessment/Plan:      The patient's current anticoagulation dose is Coumadin 5 mg tabs: Take as directed by coumadin clinic..  The target INR is 2.0-3.0.  The next INR is due 12/05/2009.  Anticoagulation instructions were given to patient.  Results were reviewed/authorized by Reina Fuse, PharmD.  He was notified by Reina Fuse, PharmD.         Prior Anticoagulation Instructions: INR 2.4  Continue same dose of 1/2 tablet every day except 1 tablet on Monday, Wednesday and Friday.    Current Anticoagulation Instructions: INR  2.4  Continue taking Coumadin 0.5 tab (2.5 mg) on Sun, Tues, Thur, Sat and Coumadin 1 tab (5 mg) on Mon, Wed, Fri.  Return to clinic in 4 weeks.

## 2010-04-24 NOTE — Letter (Signed)
Summary: [facility] Instructions  [facility]  [address] [place], [place] [address]   Phone: [phone]  Fax: [phone]       [name]    04-15-1945    MRN: [id number]        Procedure Day /Date:TUESDAY 04/30/2009     Arrival Time: 8AM     Procedure Time: 9AM     Location of Procedure:                    X   [facility] (4th Floor)                        PREPARATION FOR COLONOSCOPY WITH MOVIPREP   Starting 5 days prior to your procedure 04/25/2009 do not eat nuts, seeds, popcorn, corn, beans, peas,  salads, or any raw vegetables.  Do not take any fiber supplements (e.g. Metamucil, Citrucel, and Benefiber).  THE DAY BEFORE YOUR PROCEDURE         DATE: 04/29/2009  MWU:[id number]  1.  Drink clear liquids the entire day-NO SOLID FOOD  2.  Do not drink anything colored red or purple.  Avoid juices with pulp.  No orange juice.  3.  Drink at least 64 oz. (8 glasses) of fluid/clear liquids during the day to prevent dehydration and help the prep work efficiently.  CLEAR LIQUIDS INCLUDE: Water Jello Ice Popsicles Tea (sugar ok, no milk/cream) Powdered fruit flavored drinks Coffee (sugar ok, no milk/cream) Gatorade Juice: apple, white grape, white cranberry  Lemonade Clear bullion, consomm, broth Carbonated beverages (any kind) Strained chicken noodle soup Hard Candy                             4.  In the morning, mix first dose of MoviPrep solution:    Empty 1 Pouch A and 1 Pouch B into the disposable container    Add lukewarm drinking water to the top line of the container. Mix to dissolve    Refrigerate (mixed solution should be used within 24 hrs)  5.  Begin drinking the prep at 5:00 p.m. The MoviPrep container is divided by 4 marks.   Every 15 minutes drink the solution down to the next mark (approximately 8 oz) until the full liter is complete.   6.  Follow completed prep with 16 oz of clear liquid of your choice (Nothing red or  purple).  Continue to drink clear liquids until bedtime.  7.  Before going to bed, mix second dose of MoviPrep solution:    Empty 1 Pouch A and 1 Pouch B into the disposable container    Add lukewarm drinking water to the top line of the container. Mix to dissolve    Refrigerate  THE DAY OF YOUR PROCEDURE      DATE: 04/30/2009 DAY: TUESDAY  Beginning at 4a.m. (5 hours before procedure):         1. Every 15 minutes, drink the solution down to the next mark (approx 8 oz) until the full liter is complete.  2. Follow completed prep with 16 oz. of clear liquid of your choice.    3. You may drink clear liquids until 7AM (2 HOURS BEFORE PROCEDURE).   MEDICATION INSTRUCTIONS  Unless otherwise instructed, you should take regular prescription medications with a small sip of water   as early as possible the morning of your procedure.  Diabetic patients -  see separate instructions. INSULIN/ORAL DIABETIC MEDS    Stop taking Coumadin on 04/25/2009  (5 days before procedure).PENDING DR WALL  Additional medication instructions: You will be contaced by our office prior to your procedure for directions on holding your Coumadin/Warfarin.  If you do not hear from our office 1 week prior to your scheduled procedure, please call [phone] to discuss.          OTHER INSTRUCTIONS  You will need a responsible adult at least 65 years of age to accompany you and drive you home.   This person must remain in the waiting room during your procedure.  Wear loose fitting clothing that is easily removed.  Leave jewelry and other valuables at home.  However, you may wish to bring a book to read or  an iPod/MP3 player to listen to music as you wait for your procedure to start.  Remove all body piercing jewelry and leave at home.  Total time from sign-in until discharge is approximately 2-3 hours.  You should go home directly after your procedure and rest.  You can resume normal activities the  day  after your procedure.  The day of your procedure you should not:   Drive   Make legal decisions   Operate machinery   Drink alcohol   Return to work  You will receive specific instructions about eating, activities and medications before you leave.    The above instructions have been reviewed and explained to me by   _______________________    I fully understand and can verbalize these instructions _____________________________ Date _________

## 2010-04-24 NOTE — Progress Notes (Signed)
Summary: refill tramadol  Phone Note Refill Request Message from:  Fax from Pharmacy on February 06, 2010 12:26 PM  tramodol 50 mg  1 by mouth q6hrs as needed  #180 last filled 08/29/2009 [facility]  I dont see this on his med list  please advise    Method Requested: Fax to Local Pharmacy Initial call taken by: Duard Brady LPN,  February 06, 2010 12:27 PM  Follow-up for Phone Call        ok Follow-up by: Gordy Savers  MD,  February 06, 2010 12:48 PM    New/Updated Medications: TRAMADOL HCL 50 MG TABS (TRAMADOL HCL) 1 by mouth q6hr as needed Prescriptions: TRAMADOL HCL 50 MG TABS (TRAMADOL HCL) 1 by mouth q6hr as needed  #180 x 0   Entered by:   Duard Brady LPN   Authorized by:   Gordy Savers  MD   Signed by:   Duard Brady LPN on 04/54/0981   Method used:   Historical   RxID:   [id number]

## 2010-04-24 NOTE — Letter (Signed)
Summary: Out of Work  Adult nurse at [employer]  [address]   [place], [place] [address]   Phone: [phone]  Fax: [phone]    Aug 05, 2009   Employee:  CASHAWN [employer]AN[employer] [facility] [employer]    To Whom It May Concern:   For Medical reasons, please excuse the above named employee from work for the following dates:  Start:    End:    If you need additional information, please feel free to contact our office.         Sincerely,    Gordy Savers  MD

## 2010-04-25 ENCOUNTER — Other Ambulatory Visit: Payer: Self-pay | Admitting: Internal Medicine

## 2010-04-25 ENCOUNTER — Telehealth: Payer: Self-pay | Admitting: Internal Medicine

## 2010-04-25 DIAGNOSIS — R52 Pain, unspecified: Secondary | ICD-10-CM

## 2010-04-25 DIAGNOSIS — I10 Essential (primary) hypertension: Secondary | ICD-10-CM

## 2010-04-25 NOTE — Telephone Encounter (Signed)
Pt called and needs refills of Tramadol 50mg  #180, Temazepam 30mg  #50, Coumedin 5mg , Potassium ? Dose, Furosemide 80mg . Pls call in to [facility] [phone]

## 2010-04-28 MED ORDER — TRAMADOL HCL 50 MG PO TABS: 50.0000 mg | ORAL_TABLET | Freq: Four times a day (QID) | ORAL | Status: DC | PRN

## 2010-04-28 MED ORDER — METOLAZONE 2.5 MG PO TABS: 2.5000 mg | ORAL_TABLET | ORAL | Status: DC

## 2010-04-28 MED ORDER — WARFARIN SODIUM 5 MG PO TABS: 5.0000 mg | ORAL_TABLET | Freq: Every day | ORAL | Status: DC

## 2010-04-28 MED ORDER — FUROSEMIDE 80 MG PO TABS: 80.0000 mg | ORAL_TABLET | Freq: Two times a day (BID) | ORAL | Status: DC

## 2010-04-28 MED ORDER — POTASSIUM CHLORIDE CRYS ER 20 MEQ PO TBCR: 20.0000 meq | EXTENDED_RELEASE_TABLET | Freq: Two times a day (BID) | ORAL | Status: DC

## 2010-04-28 MED ORDER — TEMAZEPAM 30 MG PO CAPS: 30.0000 mg | ORAL_CAPSULE | Freq: Every evening | ORAL | Status: DC | PRN

## 2010-04-28 NOTE — Telephone Encounter (Signed)
rx's printed and given to pt. KIK

## 2010-04-30 NOTE — Medication Information (Signed)
Summary: CCR/TMJ  Medications Added VYTORIN 10-40 MG TABS (EZETIMIBE-SIMVASTATIN) Take one tablet by mouth daily at bedtime       Anticoagulant Therapy  Managed by: Weston Brass, PharmD Referring MD: [name] PCP: [name], MD Supervising MD: Shirlee Latch MD, Freida Busman Indication 1: Atrial Fibrillation (ICD-427.31) Indication 2:  ---- Lab Used: [facility] Site: [address] INR POC 2.1 INR RANGE 2-3  Dietary changes: no    Health status changes: no    Bleeding/hemorrhagic complications: no    Recent/future hospitalizations: no    Any changes in medication regimen? yes       Details: starting vytorin   Recent/future dental: no  Any missed doses?: yes     Details: Was out of Coumadin and unable to pick up new Rx.  Came and picked up 3mg  tablets here (only thing we had)  He has been taking 3mg   since Monday.   Is patient compliant with meds? yes       Current Medications (verified): 1)  Advair Diskus 250-50 Mcg/dose Misc (Fluticasone-Salmeterol) .... Inhale 1 Puff As Directed Twice A Day 2)  Metolazone 2.5 Mg Tabs (Metolazone) .... Monday Wednesday, and Friday Only 3)  Coumadin 5 Mg Tabs (Warfarin Sodium) .... Take As Directed By Coumadin Clinic. 4)  Klor-Con M20 20 Meq  Tbcr (Potassium Chloride Crys Cr) .Marland Kitchen.. 1 Two Times A Day 5)  Prinivil 10  Mg  Tabs (Lisinopril) .... One Daily 6)  Amiodarone Hcl 200 Mg  Tabs (Amiodarone Hcl) .... Take One Tablet By Mouth Once Daily. 7)  Humalog Kwikpen 100 Unit/ml Soln (Insulin Lispro (Human)) .Marland Kitchen.. 12 Units Prior To Every Meal; Add 6 Units If Blood Sugar Over 200 8)  Novofine 32g X 6 Mm Misc (Insulin Pen Needle) .... As Directed 9)  Proair Hfa 108 (90 Base) Mcg/act Aers (Albuterol Sulfate) .... Two Puffs Every 6 Hours For Shortness of Breath 10)  Insulin Syringe/needle 27g X 1/2" 0.5 Ml Misc (Insulin Syringe-Needle U-100) .... Used Twice Daily 11)  Metoprolol Succinate 50 Mg Xr24h-Tab (Metoprolol Succinate) .... One Daily 12)   Multivitamins  Tabs (Multiple Vitamin) .Marland Kitchen.. 1 Tablet By Mouth Once Daily 13)  Aciphex 20 Mg Tbec (Rabeprazole Sodium) .... Take One Tab Daily 14)  Lantus Solostar 100 Unit/ml Soln (Insulin Glargine) .... 20 Units At Bedtime 15)  Spiriva Handihaler 18 Mcg Caps (Tiotropium Bromide Monohydrate) .... Qd 16)  Furosemide 80 Mg Tabs (Furosemide) .... One Twice Daily 17)  Temazepam 30 Mg Caps (Temazepam) .Marland Kitchen.. 1 By Mouth Qhs 18)  Neomycin-Polymyxin-Hc 3.5-10000-1 Soln (Neomycin-Polymyxin-Hc) .... 4 Drops To The Right Ear 4 Times Daily 19)  Nortriptyline Hcl 25 Mg Caps (Nortriptyline Hcl) .... One At Bedtime 20)  Hydrocodone-Acetaminophen 5-500 Mg Tabs (Hydrocodone-Acetaminophen) .... One Every 6 Hours As Needed For Pain 21)  Tramadol Hcl 50 Mg Tabs (Tramadol Hcl) .Marland Kitchen.. 1 By Mouth Q6hr As Needed 22)  Vytorin 10-40 Mg Tabs (Ezetimibe-Simvastatin) .... Take One Tablet By Mouth Daily At Bedtime  Allergies: 1)  Tetracycline Hcl (Tetracycline Hcl) 2)  Aspirin (Aspirin)  Anticoagulation Management History:      The patient is taking warfarin and comes in today for a routine follow up visit.  Positive risk factors for bleeding include presence of serious comorbidities.  Negative risk factors for bleeding include an age less than 18 years old.  The bleeding index is 'intermediate risk'.  Positive CHADS2 values include History of CHF, History of HTN, and History of Diabetes.  Negative CHADS2 values include Age > 75 years  old.  The start date was 10/23/2003.  His last INR was 1.8.  Anticoagulation responsible provider: Shirlee Latch MD, Ladasia Sircy.  INR POC: 2.1.  Cuvette Lot#: [id number].  Exp: 04/2011.    Anticoagulation Management Assessment/Plan:      The patient's current anticoagulation dose is Coumadin 5 mg tabs: Take as directed by coumadin clinic..  The target INR is 2.0-3.0.  The next INR is due 05/15/2010.  Anticoagulation instructions were given to patient.  Results were reviewed/authorized by Weston Brass, PharmD.  He  was notified by Weston Brass PharmD.         Prior Anticoagulation Instructions: INR 1.4  Take 1.5 tablets today, then start taking 1/2 tablet daily except 1 tablet on Thursdays and Sundays.  Recheck in 2 weeks.    Current Anticoagulation Instructions: INR 2.1  Continue same dose of Coumadin: 2.5mg  daily except 5mg  on Sunday and Thursday.  Recheck INR in 3 weeks.

## 2010-05-07 ENCOUNTER — Ambulatory Visit (INDEPENDENT_AMBULATORY_CARE_PROVIDER_SITE_OTHER): Payer: Medicare Other | Admitting: Family Medicine

## 2010-05-07 ENCOUNTER — Encounter: Payer: Self-pay | Admitting: Family Medicine

## 2010-05-07 VITALS — BP 152/94 | HR 91 | Temp 98.6°F | Wt 206.0 lb

## 2010-05-07 DIAGNOSIS — I4891 Unspecified atrial fibrillation: Secondary | ICD-10-CM

## 2010-05-07 DIAGNOSIS — J40 Bronchitis, not specified as acute or chronic: Secondary | ICD-10-CM

## 2010-05-07 DIAGNOSIS — I509 Heart failure, unspecified: Secondary | ICD-10-CM

## 2010-05-07 DIAGNOSIS — I4892 Unspecified atrial flutter: Secondary | ICD-10-CM

## 2010-05-07 MED ORDER — AMOXICILLIN 500 MG PO TABS: 500.0000 mg | ORAL_TABLET | Freq: Three times a day (TID) | ORAL | Status: AC

## 2010-05-07 MED ORDER — HYDROCODONE-HOMATROPINE 5-1.5 MG/5ML PO SYRP: 5.0000 mL | ORAL_SOLUTION | Freq: Four times a day (QID) | ORAL | Status: AC | PRN

## 2010-05-07 NOTE — Progress Notes (Signed)
  Subjective:    Patient ID: [name], male    DOB: 1945/06/25, 65 y.o.   MRN: [id number]  HPI Here for one week of coughing up yellow sputum with some increased SOB. No fever. He has CHF and is supposed to be taking Lasix twice a day, but he admits that he has been taking this only once a day for several months. Both feet and lower legs are now quite swollen. No chest pain.    Review of Systems  Constitutional: Negative.   Eyes: Negative.   Respiratory: Positive for cough, chest tightness and shortness of breath. Negative for apnea, choking, wheezing and stridor.   Cardiovascular: Positive for leg swelling. Negative for chest pain and palpitations.       Objective:   Physical Exam  Constitutional: He appears well-developed and well-nourished. No distress.  HENT:  Right Ear: External ear normal.  Left Ear: External ear normal.  Nose: Nose normal.  Mouth/Throat: Oropharynx is clear and moist. No oropharyngeal exudate.  Eyes: Conjunctivae and EOM are normal. Pupils are equal, round, and reactive to light.  Neck: Neck supple.  Cardiovascular: Normal rate and normal heart sounds.  An irregularly irregular rhythm present. Exam reveals no friction rub.   No murmur heard. Pulmonary/Chest: He is in respiratory distress. He has wheezes. He has rales.       Mild bilateral basilar rales   Lymphadenopathy:    He has no cervical adenopathy.  Skin: He is not diaphoretic.          Assessment & Plan:  He has bronchitis and this has slightly exacerbated the CHF. I urged him to take his Lasix BID as directed. Use Amoxicillin.

## 2010-05-15 ENCOUNTER — Encounter (INDEPENDENT_AMBULATORY_CARE_PROVIDER_SITE_OTHER): Payer: Medicare Other

## 2010-05-15 ENCOUNTER — Encounter: Payer: Self-pay | Admitting: Internal Medicine

## 2010-05-15 DIAGNOSIS — Z7901 Long term (current) use of anticoagulants: Secondary | ICD-10-CM

## 2010-05-15 DIAGNOSIS — I4891 Unspecified atrial fibrillation: Secondary | ICD-10-CM

## 2010-05-15 LAB — CONVERTED CEMR LAB: POC INR: 2.8

## 2010-05-20 NOTE — Medication Information (Signed)
Summary: rov/sp  Anticoagulant Therapy  Managed by: Eda Keys, PharmD Referring MD: [name] PCP: [name], MD Supervising MD: [name] MD, Sharlot Gowda Indication 1: Atrial Fibrillation (ICD-427.31) Indication 2:  ---- Lab Used: [facility] Site: [address] INR POC 2.8 INR RANGE 2-3  Dietary changes: no    Health status changes: no    Bleeding/hemorrhagic complications: no    Recent/future hospitalizations: no    Any changes in medication regimen? no    Recent/future dental: no  Any missed doses?: no       Is patient compliant with meds? yes       Allergies: 1)  Tetracycline Hcl (Tetracycline Hcl) 2)  Aspirin (Aspirin)  Anticoagulation Management History:      The patient is taking warfarin and comes in today for a routine follow up visit.  Positive risk factors for bleeding include presence of serious comorbidities.  Negative risk factors for bleeding include an age less than 63 years old.  The bleeding index is 'intermediate risk'.  Positive CHADS2 values include History of CHF, History of HTN, and History of Diabetes.  Negative CHADS2 values include Age > 18 years old.  The start date was 10/23/2003.  His last INR was 1.8.  Anticoagulation responsible provider: [name] MD, Sharlot Gowda.  INR POC: 2.8.  Cuvette Lot#: [id number].  Exp: 04/2011.    Anticoagulation Management Assessment/Plan:      The patient's current anticoagulation dose is Coumadin 5 mg tabs: Take as directed by coumadin clinic..  The target INR is 2.0-3.0.  The next INR is due 06/12/2010.  Anticoagulation instructions were given to patient.  Results were reviewed/authorized by Eda Keys, PharmD.  He was notified by Eda Keys.         Prior Anticoagulation Instructions: INR 2.1  Continue same dose of Coumadin: 2.5mg  daily except 5mg  on Sunday and Thursday.  Recheck INR in 3 weeks.   Current Anticoagulation Instructions: INR 2.8  Continue taking 1 tablet on MOnday and Thursday and  1/2 tablet all other days. Return to clinic in 4 weeks.

## 2010-06-11 LAB — BASIC METABOLIC PANEL
BUN: 32 mg/dL — ABNORMAL HIGH (ref 6–23)
BUN: 33 mg/dL — ABNORMAL HIGH (ref 6–23)
BUN: 44 mg/dL — ABNORMAL HIGH (ref 6–23)
BUN: 46 mg/dL — ABNORMAL HIGH (ref 6–23)
BUN: 62 mg/dL — ABNORMAL HIGH (ref 6–23)
CO2: 26 mEq/L (ref 19–32)
CO2: 27 mEq/L (ref 19–32)
CO2: 27 mEq/L (ref 19–32)
CO2: 30 mEq/L (ref 19–32)
Calcium: 7.8 mg/dL — ABNORMAL LOW (ref 8.4–10.5)
Calcium: 8.2 mg/dL — ABNORMAL LOW (ref 8.4–10.5)
Calcium: 8.3 mg/dL — ABNORMAL LOW (ref 8.4–10.5)
Calcium: 8.6 mg/dL (ref 8.4–10.5)
Chloride: 92 mEq/L — ABNORMAL LOW (ref 96–112)
Chloride: 98 mEq/L (ref 96–112)
Chloride: 99 mEq/L (ref 96–112)
Chloride: 99 mEq/L (ref 96–112)
Chloride: 99 mEq/L (ref 96–112)
Creatinine, Ser: 1.99 mg/dL — ABNORMAL HIGH (ref 0.4–1.5)
Creatinine, Ser: 2.12 mg/dL — ABNORMAL HIGH (ref 0.4–1.5)
Creatinine, Ser: 2.43 mg/dL — ABNORMAL HIGH (ref 0.4–1.5)
Creatinine, Ser: 2.61 mg/dL — ABNORMAL HIGH (ref 0.4–1.5)
Creatinine, Ser: 2.93 mg/dL — ABNORMAL HIGH (ref 0.4–1.5)
GFR calc Af Amer: 26 mL/min — ABNORMAL LOW (ref >60)
GFR calc Af Amer: 30 mL/min — ABNORMAL LOW (ref >60)
GFR calc Af Amer: 33 mL/min — ABNORMAL LOW (ref >60)
GFR calc non Af Amer: 23 mL/min — ABNORMAL LOW (ref >60)
GFR calc non Af Amer: 27 mL/min — ABNORMAL LOW (ref >60)
GFR calc non Af Amer: 30 mL/min — ABNORMAL LOW (ref >60)
GFR calc non Af Amer: 34 mL/min — ABNORMAL LOW (ref >60)
GFR calc non Af Amer: 34 mL/min — ABNORMAL LOW (ref >60)
Glucose, Bld: 302 mg/dL — ABNORMAL HIGH (ref 70–99)
Glucose, Bld: 70 mg/dL (ref 70–99)
Glucose, Bld: 86 mg/dL (ref 70–99)
Potassium: 4.4 mEq/L (ref 3.5–5.1)
Potassium: 4.7 mEq/L (ref 3.5–5.1)
Potassium: 5 mEq/L (ref 3.5–5.1)
Potassium: 5 mEq/L (ref 3.5–5.1)
Potassium: 5.9 mEq/L — ABNORMAL HIGH (ref 3.5–5.1)
Sodium: 132 mEq/L — ABNORMAL LOW (ref 135–145)
Sodium: 133 mEq/L — ABNORMAL LOW (ref 135–145)
Sodium: 134 mEq/L — ABNORMAL LOW (ref 135–145)
Sodium: 136 mEq/L (ref 135–145)

## 2010-06-11 LAB — URINE CULTURE
Colony Count: NO GROWTH
Culture: NO GROWTH

## 2010-06-11 LAB — GLUCOSE, CAPILLARY
Glucose-Capillary: 103 mg/dL — ABNORMAL HIGH (ref 70–99)
Glucose-Capillary: 104 mg/dL — ABNORMAL HIGH (ref 70–99)
Glucose-Capillary: 104 mg/dL — ABNORMAL HIGH (ref 70–99)
Glucose-Capillary: 107 mg/dL — ABNORMAL HIGH (ref 70–99)
Glucose-Capillary: 112 mg/dL — ABNORMAL HIGH (ref 70–99)
Glucose-Capillary: 113 mg/dL — ABNORMAL HIGH (ref 70–99)
Glucose-Capillary: 119 mg/dL — ABNORMAL HIGH (ref 70–99)
Glucose-Capillary: 120 mg/dL — ABNORMAL HIGH (ref 70–99)
Glucose-Capillary: 125 mg/dL — ABNORMAL HIGH (ref 70–99)
Glucose-Capillary: 129 mg/dL — ABNORMAL HIGH (ref 70–99)
Glucose-Capillary: 13 mg/dL — CL (ref 70–99)
Glucose-Capillary: 177 mg/dL — ABNORMAL HIGH (ref 70–99)
Glucose-Capillary: 181 mg/dL — ABNORMAL HIGH (ref 70–99)
Glucose-Capillary: 182 mg/dL — ABNORMAL HIGH (ref 70–99)
Glucose-Capillary: 183 mg/dL — ABNORMAL HIGH (ref 70–99)
Glucose-Capillary: 207 mg/dL — ABNORMAL HIGH (ref 70–99)
Glucose-Capillary: 236 mg/dL — ABNORMAL HIGH (ref 70–99)
Glucose-Capillary: 248 mg/dL — ABNORMAL HIGH (ref 70–99)
Glucose-Capillary: 271 mg/dL — ABNORMAL HIGH (ref 70–99)
Glucose-Capillary: 42 mg/dL — CL (ref 70–99)
Glucose-Capillary: 43 mg/dL — CL (ref 70–99)
Glucose-Capillary: 44 mg/dL — CL (ref 70–99)
Glucose-Capillary: 46 mg/dL — ABNORMAL LOW (ref 70–99)
Glucose-Capillary: 46 mg/dL — ABNORMAL LOW (ref 70–99)
Glucose-Capillary: 60 mg/dL — ABNORMAL LOW (ref 70–99)
Glucose-Capillary: 71 mg/dL (ref 70–99)
Glucose-Capillary: 72 mg/dL (ref 70–99)
Glucose-Capillary: 72 mg/dL (ref 70–99)
Glucose-Capillary: 75 mg/dL (ref 70–99)
Glucose-Capillary: 94 mg/dL (ref 70–99)

## 2010-06-11 LAB — URINALYSIS, ROUTINE W REFLEX MICROSCOPIC
Bilirubin Urine: NEGATIVE
Bilirubin Urine: NEGATIVE
Ketones, ur: NEGATIVE mg/dL
Nitrite: NEGATIVE
Protein, ur: NEGATIVE mg/dL
Specific Gravity, Urine: 1.011 (ref 1.005–1.030)
pH: 5 (ref 5.0–8.0)

## 2010-06-11 LAB — CARDIAC PANEL(CRET KIN+CKTOT+MB+TROPI)
CK, MB: 2.8 ng/mL (ref 0.3–4.0)
CK, MB: 4.2 ng/mL — ABNORMAL HIGH (ref 0.3–4.0)
CK, MB: 6 ng/mL — ABNORMAL HIGH (ref 0.3–4.0)
Relative Index: INVALID (ref 0.0–2.5)
Total CK: 36 U/L (ref 7–232)
Total CK: 76 U/L (ref 7–232)
Troponin I: 0.2 ng/mL — ABNORMAL HIGH (ref 0.00–0.06)
Troponin I: 0.27 ng/mL — ABNORMAL HIGH (ref 0.00–0.06)

## 2010-06-11 LAB — CULTURE, BLOOD (ROUTINE X 2)
Culture: NO GROWTH
Culture: NO GROWTH

## 2010-06-11 LAB — CBC
HCT: 35.6 % — ABNORMAL LOW (ref 39.0–52.0)
HCT: 35.9 % — ABNORMAL LOW (ref 39.0–52.0)
HCT: 36.5 % — ABNORMAL LOW (ref 39.0–52.0)
HCT: 37.1 % — ABNORMAL LOW (ref 39.0–52.0)
HCT: 40.1 % (ref 39.0–52.0)
Hemoglobin: 11.4 g/dL — ABNORMAL LOW (ref 13.0–17.0)
Hemoglobin: 11.6 g/dL — ABNORMAL LOW (ref 13.0–17.0)
Hemoglobin: 11.6 g/dL — ABNORMAL LOW (ref 13.0–17.0)
Hemoglobin: 11.6 g/dL — ABNORMAL LOW (ref 13.0–17.0)
Hemoglobin: 11.7 g/dL — ABNORMAL LOW (ref 13.0–17.0)
Hemoglobin: 12.8 g/dL — ABNORMAL LOW (ref 13.0–17.0)
MCHC: 31.7 g/dL (ref 30.0–36.0)
MCHC: 32 g/dL (ref 30.0–36.0)
MCHC: 32.1 g/dL (ref 30.0–36.0)
MCHC: 32.7 g/dL (ref 30.0–36.0)
MCV: 82.3 fL (ref 78.0–100.0)
MCV: 83 fL (ref 78.0–100.0)
MCV: 83.4 fL (ref 78.0–100.0)
MCV: 84.3 fL (ref 78.0–100.0)
Platelets: 159 10*3/uL (ref 150–400)
Platelets: 162 10*3/uL (ref 150–400)
Platelets: 165 10*3/uL (ref 150–400)
Platelets: 190 10*3/uL (ref 150–400)
RBC: 4.32 MIL/uL (ref 4.22–5.81)
RBC: 4.33 MIL/uL (ref 4.22–5.81)
RBC: 4.35 MIL/uL (ref 4.22–5.81)
RBC: 4.46 MIL/uL (ref 4.22–5.81)
RDW: 17.9 % — ABNORMAL HIGH (ref 11.5–15.5)
RDW: 18 % — ABNORMAL HIGH (ref 11.5–15.5)
RDW: 18.2 % — ABNORMAL HIGH (ref 11.5–15.5)
WBC: 6.9 10*3/uL (ref 4.0–10.5)
WBC: 7 10*3/uL (ref 4.0–10.5)
WBC: 7.6 10*3/uL (ref 4.0–10.5)
WBC: 7.6 10*3/uL (ref 4.0–10.5)
WBC: 7.8 10*3/uL (ref 4.0–10.5)
WBC: 8.3 10*3/uL (ref 4.0–10.5)

## 2010-06-11 LAB — BLOOD GAS, ARTERIAL
Acid-base deficit: 1.5 mmol/L (ref 0.0–2.0)
Bicarbonate: 25.5 mEq/L — ABNORMAL HIGH (ref 20.0–24.0)
Drawn by: 290241
MECHVT: 550 mL
O2 Content: 6 L/min
O2 Saturation: 96.7 %
O2 Saturation: 98.4 %
PEEP: 5 cmH2O
Patient temperature: 98.6
Patient temperature: 98.6
Patient temperature: 99.1
RATE: 16 resp/min
TCO2: 27.7 mmol/L (ref 0–100)
TCO2: 29.3 mmol/L (ref 0–100)
pCO2 arterial: 50.1 mmHg — ABNORMAL HIGH (ref 35.0–45.0)
pCO2 arterial: 84.6 mmHg (ref 35.0–45.0)
pH, Arterial: 7.191 — CL (ref 7.350–7.450)
pH, Arterial: 7.34 — ABNORMAL LOW (ref 7.350–7.450)
pO2, Arterial: 127 mmHg — ABNORMAL HIGH (ref 80.0–100.0)
pO2, Arterial: 447 mmHg — ABNORMAL HIGH (ref 80.0–100.0)

## 2010-06-11 LAB — LACTIC ACID, PLASMA: Lactic Acid, Venous: 1.9 mmol/L (ref 0.5–2.2)

## 2010-06-11 LAB — CK TOTAL AND CKMB (NOT AT ARMC): Total CK: 63 U/L (ref 7–232)

## 2010-06-11 LAB — COMPREHENSIVE METABOLIC PANEL
ALT: 21 U/L (ref 0–53)
AST: 28 U/L (ref 0–37)
Alkaline Phosphatase: 63 U/L (ref 39–117)
BUN: 33 mg/dL — ABNORMAL HIGH (ref 6–23)
CO2: 28 mEq/L (ref 19–32)
Calcium: 8.2 mg/dL — ABNORMAL LOW (ref 8.4–10.5)
GFR calc non Af Amer: 26 mL/min — ABNORMAL LOW (ref >60)
Glucose, Bld: 144 mg/dL — ABNORMAL HIGH (ref 70–99)
Glucose, Bld: 34 mg/dL — CL (ref 70–99)
Potassium: 4.9 mEq/L (ref 3.5–5.1)
Sodium: 134 mEq/L — ABNORMAL LOW (ref 135–145)
Total Protein: 8.2 g/dL (ref 6.0–8.3)

## 2010-06-11 LAB — POCT CARDIAC MARKERS
Myoglobin, poc: 124 ng/mL (ref 12–200)
Myoglobin, poc: 78.7 ng/mL (ref 12–200)
Troponin i, poc: 0.15 ng/mL — ABNORMAL HIGH (ref 0.00–0.09)

## 2010-06-11 LAB — CULTURE, BAL-QUANTITATIVE W GRAM STAIN: Colony Count: 4000

## 2010-06-11 LAB — BRAIN NATRIURETIC PEPTIDE
Pro B Natriuretic peptide (BNP): 380 pg/mL — ABNORMAL HIGH (ref 0.0–100.0)
Pro B Natriuretic peptide (BNP): 408 pg/mL — ABNORMAL HIGH (ref 0.0–100.0)

## 2010-06-11 LAB — PROTIME-INR
INR: 1.37 (ref 0.00–1.49)
INR: 2.21 — ABNORMAL HIGH (ref 0.00–1.49)
INR: 2.48 — ABNORMAL HIGH (ref 0.00–1.49)
Prothrombin Time: 17 seconds — ABNORMAL HIGH (ref 11.6–15.2)
Prothrombin Time: 21.1 seconds — ABNORMAL HIGH (ref 11.6–15.2)
Prothrombin Time: 23.8 seconds — ABNORMAL HIGH (ref 11.6–15.2)
Prothrombin Time: 24.3 seconds — ABNORMAL HIGH (ref 11.6–15.2)

## 2010-06-11 LAB — DIFFERENTIAL
Basophils Absolute: 0 10*3/uL (ref 0.0–0.1)
Lymphocytes Relative: 4 % — ABNORMAL LOW (ref 12–46)
Lymphocytes Relative: 6 % — ABNORMAL LOW (ref 12–46)
Lymphs Abs: 0.2 10*3/uL — ABNORMAL LOW (ref 0.7–4.0)
Lymphs Abs: 0.4 10*3/uL — ABNORMAL LOW (ref 0.7–4.0)
Monocytes Absolute: 0.7 10*3/uL (ref 0.1–1.0)
Monocytes Relative: 17 % — ABNORMAL HIGH (ref 3–12)
Neutro Abs: 5.2 10*3/uL (ref 1.7–7.7)
Neutro Abs: 5.9 10*3/uL (ref 1.7–7.7)
Neutrophils Relative %: 77 % (ref 43–77)

## 2010-06-11 LAB — URINE MICROSCOPIC-ADD ON

## 2010-06-11 LAB — T3, FREE: T3, Free: 2.1 pg/mL — ABNORMAL LOW (ref 2.3–4.2)

## 2010-06-11 LAB — TROPONIN I: Troponin I: 0.24 ng/mL — ABNORMAL HIGH (ref 0.00–0.06)

## 2010-06-11 LAB — MAGNESIUM: Magnesium: 2 mg/dL (ref 1.5–2.5)

## 2010-06-11 LAB — TSH: TSH: 4.664 u[IU]/mL — ABNORMAL HIGH (ref 0.350–4.500)

## 2010-06-11 LAB — PHOSPHORUS: Phosphorus: 4.2 mg/dL (ref 2.3–4.6)

## 2010-06-12 ENCOUNTER — Ambulatory Visit (INDEPENDENT_AMBULATORY_CARE_PROVIDER_SITE_OTHER): Payer: Medicare Other | Admitting: *Deleted

## 2010-06-12 DIAGNOSIS — I4892 Unspecified atrial flutter: Secondary | ICD-10-CM

## 2010-06-12 DIAGNOSIS — I4891 Unspecified atrial fibrillation: Secondary | ICD-10-CM

## 2010-06-12 DIAGNOSIS — Z7901 Long term (current) use of anticoagulants: Secondary | ICD-10-CM

## 2010-06-12 LAB — BASIC METABOLIC PANEL
BUN: 31 mg/dL — ABNORMAL HIGH (ref 6–23)
BUN: 37 mg/dL — ABNORMAL HIGH (ref 6–23)
BUN: 39 mg/dL — ABNORMAL HIGH (ref 6–23)
BUN: 46 mg/dL — ABNORMAL HIGH (ref 6–23)
BUN: 52 mg/dL — ABNORMAL HIGH (ref 6–23)
CO2: 37 mEq/L — ABNORMAL HIGH (ref 19–32)
CO2: 40 mEq/L — ABNORMAL HIGH (ref 19–32)
CO2: 40 mEq/L — ABNORMAL HIGH (ref 19–32)
CO2: 41 mEq/L (ref 19–32)
Calcium: 8.7 mg/dL (ref 8.4–10.5)
Calcium: 8.7 mg/dL (ref 8.4–10.5)
Calcium: 9 mg/dL (ref 8.4–10.5)
Calcium: 9.1 mg/dL (ref 8.4–10.5)
Chloride: 102 mEq/L (ref 96–112)
Chloride: 91 mEq/L — ABNORMAL LOW (ref 96–112)
Chloride: 95 mEq/L — ABNORMAL LOW (ref 96–112)
Chloride: 95 mEq/L — ABNORMAL LOW (ref 96–112)
Chloride: 98 mEq/L (ref 96–112)
Creatinine, Ser: 1.67 mg/dL — ABNORMAL HIGH (ref 0.4–1.5)
Creatinine, Ser: 1.71 mg/dL — ABNORMAL HIGH (ref 0.4–1.5)
Creatinine, Ser: 2.01 mg/dL — ABNORMAL HIGH (ref 0.4–1.5)
Creatinine, Ser: 2.31 mg/dL — ABNORMAL HIGH (ref 0.4–1.5)
GFR calc Af Amer: 35 mL/min — ABNORMAL LOW (ref >60)
GFR calc Af Amer: 41 mL/min — ABNORMAL LOW (ref >60)
GFR calc Af Amer: 49 mL/min — ABNORMAL LOW (ref >60)
GFR calc Af Amer: 51 mL/min — ABNORMAL LOW (ref >60)
GFR calc non Af Amer: 29 mL/min — ABNORMAL LOW (ref >60)
GFR calc non Af Amer: 34 mL/min — ABNORMAL LOW (ref >60)
GFR calc non Af Amer: 41 mL/min — ABNORMAL LOW (ref >60)
GFR calc non Af Amer: 42 mL/min — ABNORMAL LOW (ref >60)
Glucose, Bld: 114 mg/dL — ABNORMAL HIGH (ref 70–99)
Glucose, Bld: 122 mg/dL — ABNORMAL HIGH (ref 70–99)
Glucose, Bld: 138 mg/dL — ABNORMAL HIGH (ref 70–99)
Glucose, Bld: 160 mg/dL — ABNORMAL HIGH (ref 70–99)
Glucose, Bld: 181 mg/dL — ABNORMAL HIGH (ref 70–99)
Potassium: 4.2 mEq/L (ref 3.5–5.1)
Potassium: 4.3 mEq/L (ref 3.5–5.1)
Potassium: 4.5 mEq/L (ref 3.5–5.1)
Potassium: 4.5 mEq/L (ref 3.5–5.1)
Potassium: 4.6 mEq/L (ref 3.5–5.1)
Sodium: 138 mEq/L (ref 135–145)
Sodium: 139 mEq/L (ref 135–145)
Sodium: 141 mEq/L (ref 135–145)
Sodium: 141 mEq/L (ref 135–145)
Sodium: 142 mEq/L (ref 135–145)

## 2010-06-12 LAB — CBC
HCT: 36.9 % — ABNORMAL LOW (ref 39.0–52.0)
HCT: 38.1 % — ABNORMAL LOW (ref 39.0–52.0)
HCT: 38.4 % — ABNORMAL LOW (ref 39.0–52.0)
Hemoglobin: 12 g/dL — ABNORMAL LOW (ref 13.0–17.0)
Hemoglobin: 12.4 g/dL — ABNORMAL LOW (ref 13.0–17.0)
MCHC: 32.3 g/dL (ref 30.0–36.0)
MCHC: 32.4 g/dL (ref 30.0–36.0)
MCV: 82.1 fL (ref 78.0–100.0)
Platelets: 206 10*3/uL (ref 150–400)
Platelets: 234 10*3/uL (ref 150–400)
Platelets: 249 10*3/uL (ref 150–400)
RBC: 4.68 MIL/uL (ref 4.22–5.81)
RDW: 17.5 % — ABNORMAL HIGH (ref 11.5–15.5)
RDW: 17.9 % — ABNORMAL HIGH (ref 11.5–15.5)
RDW: 18 % — ABNORMAL HIGH (ref 11.5–15.5)
WBC: 7.7 10*3/uL (ref 4.0–10.5)
WBC: 7.9 10*3/uL (ref 4.0–10.5)

## 2010-06-12 LAB — GLUCOSE, CAPILLARY
Glucose-Capillary: 101 mg/dL — ABNORMAL HIGH (ref 70–99)
Glucose-Capillary: 107 mg/dL — ABNORMAL HIGH (ref 70–99)
Glucose-Capillary: 119 mg/dL — ABNORMAL HIGH (ref 70–99)
Glucose-Capillary: 134 mg/dL — ABNORMAL HIGH (ref 70–99)
Glucose-Capillary: 142 mg/dL — ABNORMAL HIGH (ref 70–99)
Glucose-Capillary: 150 mg/dL — ABNORMAL HIGH (ref 70–99)
Glucose-Capillary: 167 mg/dL — ABNORMAL HIGH (ref 70–99)
Glucose-Capillary: 169 mg/dL — ABNORMAL HIGH (ref 70–99)
Glucose-Capillary: 169 mg/dL — ABNORMAL HIGH (ref 70–99)
Glucose-Capillary: 170 mg/dL — ABNORMAL HIGH (ref 70–99)
Glucose-Capillary: 171 mg/dL — ABNORMAL HIGH (ref 70–99)
Glucose-Capillary: 176 mg/dL — ABNORMAL HIGH (ref 70–99)
Glucose-Capillary: 178 mg/dL — ABNORMAL HIGH (ref 70–99)
Glucose-Capillary: 182 mg/dL — ABNORMAL HIGH (ref 70–99)
Glucose-Capillary: 191 mg/dL — ABNORMAL HIGH (ref 70–99)
Glucose-Capillary: 198 mg/dL — ABNORMAL HIGH (ref 70–99)
Glucose-Capillary: 206 mg/dL — ABNORMAL HIGH (ref 70–99)
Glucose-Capillary: 215 mg/dL — ABNORMAL HIGH (ref 70–99)
Glucose-Capillary: 223 mg/dL — ABNORMAL HIGH (ref 70–99)
Glucose-Capillary: 234 mg/dL — ABNORMAL HIGH (ref 70–99)
Glucose-Capillary: 255 mg/dL — ABNORMAL HIGH (ref 70–99)
Glucose-Capillary: 255 mg/dL — ABNORMAL HIGH (ref 70–99)
Glucose-Capillary: 256 mg/dL — ABNORMAL HIGH (ref 70–99)
Glucose-Capillary: 256 mg/dL — ABNORMAL HIGH (ref 70–99)
Glucose-Capillary: 65 mg/dL — ABNORMAL LOW (ref 70–99)
Glucose-Capillary: 68 mg/dL — ABNORMAL LOW (ref 70–99)
Glucose-Capillary: 80 mg/dL (ref 70–99)

## 2010-06-12 LAB — PROTIME-INR
INR: 2.28 — ABNORMAL HIGH (ref 0.00–1.49)
INR: 2.32 — ABNORMAL HIGH (ref 0.00–1.49)
INR: 2.36 — ABNORMAL HIGH (ref 0.00–1.49)
INR: 2.39 — ABNORMAL HIGH (ref 0.00–1.49)
INR: 2.46 — ABNORMAL HIGH (ref 0.00–1.49)
INR: 2.51 — ABNORMAL HIGH (ref 0.00–1.49)
Prothrombin Time: 25.3 seconds — ABNORMAL HIGH (ref 11.6–15.2)
Prothrombin Time: 25.6 seconds — ABNORMAL HIGH (ref 11.6–15.2)
Prothrombin Time: 25.9 seconds — ABNORMAL HIGH (ref 11.6–15.2)
Prothrombin Time: 26.5 seconds — ABNORMAL HIGH (ref 11.6–15.2)
Prothrombin Time: 26.9 seconds — ABNORMAL HIGH (ref 11.6–15.2)

## 2010-06-12 LAB — CK TOTAL AND CKMB (NOT AT ARMC)
CK, MB: 5.7 ng/mL — ABNORMAL HIGH (ref 0.3–4.0)
CK, MB: 6.9 ng/mL (ref 0.3–4.0)
Relative Index: INVALID (ref 0.0–2.5)
Relative Index: INVALID (ref 0.0–2.5)
Total CK: 64 U/L (ref 7–232)
Total CK: 90 U/L (ref 7–232)

## 2010-06-12 LAB — BLOOD GAS, ARTERIAL
Drawn by: 326301
O2 Content: 3 L/min
Patient temperature: 98.6
TCO2: 27.5 mmol/L (ref 0–100)
pCO2 arterial: 48.1 mmHg — ABNORMAL HIGH (ref 35.0–45.0)
pH, Arterial: 7.415 (ref 7.350–7.450)

## 2010-06-12 LAB — POCT I-STAT, CHEM 8
Calcium, Ion: 1.01 mmol/L — ABNORMAL LOW (ref 1.12–1.32)
HCT: 42 % (ref 39.0–52.0)
Sodium: 134 mEq/L — ABNORMAL LOW (ref 135–145)
TCO2: 33 mmol/L (ref 0–100)

## 2010-06-12 LAB — COMPREHENSIVE METABOLIC PANEL
ALT: 22 U/L (ref 0–53)
AST: 30 U/L (ref 0–37)
Albumin: 3 g/dL — ABNORMAL LOW (ref 3.5–5.2)
Alkaline Phosphatase: 56 U/L (ref 39–117)
BUN: 42 mg/dL — ABNORMAL HIGH (ref 6–23)
CO2: 33 mEq/L — ABNORMAL HIGH (ref 19–32)
Calcium: 8.7 mg/dL (ref 8.4–10.5)
Chloride: 100 mEq/L (ref 96–112)
Creatinine, Ser: 1.65 mg/dL — ABNORMAL HIGH (ref 0.4–1.5)
GFR calc Af Amer: 51 mL/min — ABNORMAL LOW (ref >60)
GFR calc non Af Amer: 42 mL/min — ABNORMAL LOW (ref >60)
Glucose, Bld: 115 mg/dL — ABNORMAL HIGH (ref 70–99)
Potassium: 4.3 mEq/L (ref 3.5–5.1)
Sodium: 142 mEq/L (ref 135–145)
Total Bilirubin: 1 mg/dL (ref 0.3–1.2)
Total Protein: 7.4 g/dL (ref 6.0–8.3)

## 2010-06-12 LAB — PHOSPHORUS: Phosphorus: 3.7 mg/dL (ref 2.3–4.6)

## 2010-06-12 LAB — BRAIN NATRIURETIC PEPTIDE
Pro B Natriuretic peptide (BNP): 677 pg/mL — ABNORMAL HIGH (ref 0.0–100.0)
Pro B Natriuretic peptide (BNP): 692 pg/mL — ABNORMAL HIGH (ref 0.0–100.0)
Pro B Natriuretic peptide (BNP): 777 pg/mL — ABNORMAL HIGH (ref 0.0–100.0)
Pro B Natriuretic peptide (BNP): 851 pg/mL — ABNORMAL HIGH (ref 0.0–100.0)

## 2010-06-12 LAB — DIFFERENTIAL
Basophils Absolute: 0 10*3/uL (ref 0.0–0.1)
Eosinophils Absolute: 0.2 10*3/uL (ref 0.0–0.7)
Eosinophils Relative: 2 % (ref 0–5)
Lymphocytes Relative: 7 % — ABNORMAL LOW (ref 12–46)
Lymphs Abs: 0.5 10*3/uL — ABNORMAL LOW (ref 0.7–4.0)
Neutrophils Relative %: 79 % — ABNORMAL HIGH (ref 43–77)

## 2010-06-12 LAB — CARDIAC PANEL(CRET KIN+CKTOT+MB+TROPI)
CK, MB: 3.4 ng/mL (ref 0.3–4.0)
CK, MB: 4 ng/mL (ref 0.3–4.0)
CK, MB: 4.8 ng/mL — ABNORMAL HIGH (ref 0.3–4.0)
Relative Index: INVALID (ref 0.0–2.5)
Relative Index: INVALID (ref 0.0–2.5)
Total CK: 59 U/L (ref 7–232)
Total CK: 60 U/L (ref 7–232)
Troponin I: 0.14 ng/mL — ABNORMAL HIGH (ref 0.00–0.06)
Troponin I: 0.15 ng/mL — ABNORMAL HIGH (ref 0.00–0.06)

## 2010-06-12 LAB — APTT: aPTT: 50 seconds — ABNORMAL HIGH (ref 24–37)

## 2010-06-12 LAB — POTASSIUM: Potassium: 4.4 mEq/L (ref 3.5–5.1)

## 2010-06-12 LAB — POCT INR: INR: 3.1

## 2010-06-12 LAB — MAGNESIUM: Magnesium: 2.3 mg/dL (ref 1.5–2.5)

## 2010-06-12 LAB — TROPONIN I: Troponin I: 0.14 ng/mL — ABNORMAL HIGH (ref 0.00–0.06)

## 2010-06-12 NOTE — Patient Instructions (Signed)
INR 3.1 Take 1/2 tablet today instead of 1 tablet Then continue current dosage: 1 tablet on Mondays and Thursdays, and 0.5 tablet the rest of the days Recheck INR in 4 weeks

## 2010-06-27 ENCOUNTER — Other Ambulatory Visit: Payer: Self-pay | Admitting: Internal Medicine

## 2010-07-10 ENCOUNTER — Ambulatory Visit (INDEPENDENT_AMBULATORY_CARE_PROVIDER_SITE_OTHER): Payer: Medicare Other | Admitting: *Deleted

## 2010-07-10 DIAGNOSIS — I4892 Unspecified atrial flutter: Secondary | ICD-10-CM

## 2010-07-10 DIAGNOSIS — I4891 Unspecified atrial fibrillation: Secondary | ICD-10-CM

## 2010-07-10 LAB — POCT INR: INR: 2.7

## 2010-07-22 ENCOUNTER — Other Ambulatory Visit: Payer: Self-pay

## 2010-07-22 MED ORDER — TIOTROPIUM BROMIDE MONOHYDRATE 18 MCG IN CAPS: 18.0000 ug | ORAL_CAPSULE | Freq: Every day | RESPIRATORY_TRACT | Status: DC

## 2010-07-22 NOTE — Telephone Encounter (Signed)
Faxed back to health dept pharm. KIK

## 2010-07-28 ENCOUNTER — Encounter: Payer: Self-pay | Admitting: Internal Medicine

## 2010-07-28 ENCOUNTER — Ambulatory Visit (INDEPENDENT_AMBULATORY_CARE_PROVIDER_SITE_OTHER): Payer: Medicare Other | Admitting: Internal Medicine

## 2010-07-28 DIAGNOSIS — E119 Type 2 diabetes mellitus without complications: Secondary | ICD-10-CM

## 2010-07-28 DIAGNOSIS — J449 Chronic obstructive pulmonary disease, unspecified: Secondary | ICD-10-CM

## 2010-07-28 DIAGNOSIS — E1149 Type 2 diabetes mellitus with other diabetic neurological complication: Secondary | ICD-10-CM

## 2010-07-28 DIAGNOSIS — I2510 Atherosclerotic heart disease of native coronary artery without angina pectoris: Secondary | ICD-10-CM

## 2010-07-28 DIAGNOSIS — I10 Essential (primary) hypertension: Secondary | ICD-10-CM

## 2010-07-28 DIAGNOSIS — I4891 Unspecified atrial fibrillation: Secondary | ICD-10-CM

## 2010-07-28 MED ORDER — INSULIN GLARGINE 100 UNIT/ML ~~LOC~~ SOLN: SUBCUTANEOUS | Status: DC

## 2010-07-28 MED ORDER — TRAMADOL HCL 50 MG PO TABS: 50.0000 mg | ORAL_TABLET | Freq: Four times a day (QID) | ORAL | Status: DC | PRN

## 2010-07-28 MED ORDER — TEMAZEPAM 30 MG PO CAPS: 30.0000 mg | ORAL_CAPSULE | Freq: Every day | ORAL | Status: DC

## 2010-07-28 MED ORDER — AMOXICILLIN-POT CLAVULANATE 875-125 MG PO TABS: 1.0000 | ORAL_TABLET | Freq: Two times a day (BID) | ORAL | Status: DC

## 2010-07-28 MED ORDER — INSULIN LISPRO 100 UNIT/ML ~~LOC~~ SOLN: SUBCUTANEOUS | Status: DC

## 2010-07-28 NOTE — Progress Notes (Signed)
  Subjective:    Patient ID: [name], male    DOB: February 10, 1946, 65 y.o.   MRN: [id number]  HPI 65 year old patient presents with a one-week history of chest congestion and cough. He has a history of congestive heart failure and chronic atrial fibrillation. He has type 2 diabetes her meal time and basal insulin. Blood sugars have been poorly controlled and chronically next is 300. He's had mild productive cough no symptoms of heart failure;  he is on diuretic therapy. Denies any fever or chills. He has been on inhalational medication as well as when necessary albuterol   Review of Systems  Constitutional: Positive for fatigue. Negative for fever, chills and appetite change.  HENT: Positive for congestion. Negative for hearing loss, ear pain, sore throat, trouble swallowing, neck stiffness, dental problem, voice change and tinnitus.   Eyes: Negative for pain, discharge and visual disturbance.  Respiratory: Positive for cough and shortness of breath. Negative for chest tightness, wheezing and stridor.   Cardiovascular: Negative for chest pain, palpitations and leg swelling.  Gastrointestinal: Negative for nausea, vomiting, abdominal pain, diarrhea, constipation, blood in stool and abdominal distention.  Genitourinary: Negative for urgency, hematuria, flank pain, discharge, difficulty urinating and genital sores.  Musculoskeletal: Negative for myalgias, back pain, joint swelling, arthralgias and gait problem.  Skin: Negative for rash.  Neurological: Negative for dizziness, syncope, speech difficulty, weakness, numbness and headaches.  Hematological: Negative for adenopathy. Does not bruise/bleed easily.  Psychiatric/Behavioral: Negative for behavioral problems and dysphoric mood. The patient is not nervous/anxious.        Objective:   Physical Exam  Constitutional: He is oriented to person, place, and time. He appears well-developed.       Obese no distress  HENT:  Head: Normocephalic.    Right Ear: External ear normal.  Left Ear: External ear normal.  Eyes: Conjunctivae and EOM are normal.  Neck: Normal range of motion.  Cardiovascular: Normal rate and normal heart sounds.        Rhythm irregular  Pulmonary/Chest: Effort normal and breath sounds normal.       Scattered coarse rhonchi O2 saturation is high as 95%  Abdominal: Bowel sounds are normal.  Musculoskeletal: Normal range of motion. He exhibits edema. He exhibits no tenderness.       Stable +2 peripheral edema  Neurological: He is alert and oriented to person, place, and time.  Psychiatric: He has a normal mood and affect. His behavior is normal.          Assessment & Plan:   Acute exacerbation of COPD Congestive heart failure Diabetes mellitus poor control Coronary artery disease  We'll treat with antibiotic therapy to include Augmentin. Expectorants were added. He will be maintained on inhalational medication and every 6 hours albuterol.  His diabetic medications will be up titrated  He has been asked return in one month for further evaluation and titration

## 2010-07-28 NOTE — Patient Instructions (Signed)
Take your antibiotic as prescribed until ALL of it is gone, but stop if you develop a rash, swelling, or any side effects of the medication.  Contact our office as soon as possible if  there are side effects of the medication.  Call or return to clinic prn if these symptoms worsen or fail to improve as anticipated.  Return in one month for follow-up

## 2010-08-05 NOTE — Assessment & Plan Note (Signed)
[facility]                         ELECTROPHYSIOLOGY OFFICE NOTE   TEJAS, SEAWOOD                       MRN:          [id number]  DATE:12/16/2006                            DOB:          12-Sep-1945    [name] returns today for followup.   He is a very pleasant middle-aged man with a cardiomyopathy and atrial  fibrillation with symptoms of heart failure secondary to his AFib.  He  underwent cardioversion last week and went back to sinus rhythm but  unfortunately, a day later began to feel bad again and was found today  to be back in AFib.  He returns today for followup.  He denies chest  pain but does have dyspnea with exertion, fatigue, and malaise.   PHYSICAL EXAMINATION:  GENERAL:  He is a pleasant, middle-aged man in no  acute distress.  VITAL SIGNS:  The blood pressure today was 130/100.  The pulse was 75  and irregularly irregular.  Respirations 16-20.  Weight was 216 pounds.  NECK:  Revealed no jugular venous distention.  LUNGS:  Revealed rales in the bases bilaterally.  There were no wheezes  or rhonchi.  There was no increased work-of-breathing.  CARDIOVASCULAR:  Revealed an irregularly irregular rhythm with normal S1  and S2.  EXTREMITIES:  Demonstrated trace peripheral edema bilaterally.   MEDICATIONS:  1. Advair.  2. Coumadin.  3. Glyburide.  4. Metformin.  5. Lisinopril.  6. Simvastatin.  7. Metoprolol 100 twice a day.  8. Potassium 20 a day.  9. Lasix 40 twice a day.   His EKG demonstrates atrial fibrillation with a right bundle branch  block.   IMPRESSION:  1. Recurrent atrial fibrillation, refractory to cardioversion.  2. Congestive heart failure, acute on chronic, systolic and diastolic.  3. Renal insufficiency.   DISCUSSION:  At this point, we need to decide on an antiarrhythmic drug  for [name] as he clearly feels very poorly in AFib and feels much  better in a sinus rhythm.  I have given him 2 options:   One  hospitalization on Tikosyn for 4 days and the second option would be  amiodarone as an outpatient with careful followup.  After considering  this, he would like to proceed with the amiodarone option as he wishes  not to go back to the hospital if possible.   I will plan to see him back in the office in approximately 1 month and  he will have followup in our Coumadin Clinic in 1 weeks, as he will  likely not require as much Coumadin on amiodarone.     Doylene Canning. [name], MD  Electronically Signed    GWT/MedQ  DD: 12/16/2006  DT: 12/16/2006  Job #: [id number]
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
## 2010-08-05 NOTE — Op Note (Signed)
NAMEPSALM, SCHAPPELL                ACCOUNT NO.:  [id number]   MEDICAL RECORD NO.:  [id number]          PATIENT TYPE:  OIB   LOCATION:  2854                         FACILITY:  [facility]   PHYSICIAN:  Doylene Canning. [name], MD    DATE OF BIRTH:  [birth year]   DATE OF PROCEDURE:  12/10/2006  DATE OF DISCHARGE:                               OPERATIVE REPORT   PROCEDURE PERFORMED:  DC cardioversion.   INDICATIONS:  Symptomatic atrial fibrillation.   INTRODUCTION:  The patient is a 65 year old male with a history of  hypertension and obesity who had atrial fibrillation in the past and is  now referred for DC cardioversion.  He has been therapeutic with his  INRs.   PROCEDURE:  After informed consent was obtained, the patient was prepped  in the usual manner.  The electrode dispersive pad was placed in the  anterior-posterior position.  He was sedated with 150 mg of sodium  Pentothal under the direction of Dr. Sampson Goon of anesthesia.  He was  cardioverted with 200 joules synchronized biphasic energy restoring  sinus rhythm.  The patient tolerated the procedure well.   COMPLICATIONS:  There were no immediate procedure complications.   RESULTS:  This demonstrates successful DC cardioversion of a patient  with persistent atrial fibrillation.      Doylene Canning. [name], MD  Electronically Signed     GWT/MEDQ  D:  12/10/2006  T:  12/10/2006  Job:  [id number]   cc:   Gordy Savers, MD

## 2010-08-05 NOTE — H&P (Signed)
NAMEXZAVIER, SWINGER                ACCOUNT NO.:  [id number]   MEDICAL RECORD NO.:  [id number]          PATIENT TYPE:  REC   LOCATION:  [facility]                         FACILITY:  [facility]   PHYSICIAN:  Bevelyn Buckles. Bensimhon, MDDATE OF BIRTH:  May 08, 1945   DATE OF ADMISSION:  10/09/2006  DATE OF DISCHARGE:  07/13/2007                              HISTORY & PHYSICAL   Admission history and physical was performed by Dr. Satira Anis;  however, this was not dictated and I am dictating of his written note  and chart information.   REASON FOR ADMISSION:  Paroxysmal atrial fibrillation with rapid  ventricular response and possible upper respiratory tract infection.   HISTORY OF PRESENT ILLNESS:  Mr. Valade is a 65 year old male with a  history of paroxysmal atrial fibrillation status post cardioversion in  July 2005.  He also has a history of coronary artery disease with  congestive heart failure and ejection fraction of 25%.  Remainder of  medical history is notable for diabetes, hypertension, hyperlipidemia,  and COPD with ongoing tobacco use.   He presents to the emergency room with a 2-week history of progressive  shortness of breath.  He has noted productive yellow cough with  intermittent fevers.  On arrival to the emergency room, he got a chest x-  ray which showed cardiomegaly and mild pulmonary venous congestion.  EKG  shows atrial fibrillation with a rapid ventricular response.   He was treated with IV Cardizem, heparin, digoxin, and bronchodilators,  and he has improved.   REVIEW OF SYSTEMS:  Notable for fevers, chills, productive cough,  shortness of breath, fatigue, orthopnea, lower extremity edema.  He  denies any chest pain.  No focal neurologic symptoms.  No bleeding.  The  remainder of the review of systems is negative except for HPI.   PROBLEM LIST:  1. History of atrial fibrillation/flutter status post TEE with      cardioversion in July 2005.  2. Coronary artery  disease status post MI in 1997, status post      previous stenting.  3. History of congestive heart failure secondary to cardiomyopathy      with an EF of 25% range.  This was felt to be out of proportion to      his coronary artery disease and perhaps due to possible alcohol      abuse.  4. Congestive heart failure as above.  5. Diabetes.  6. COPD with ongoing tobacco use.  7. Hypertension.  8. Hyperlipidemia.  9. History of alcohol abuse.  10.Obesity.  11.Noncompliance.   MEDICATIONS:  1. Actos.  2. Metoprolol.  3. Lipitor.  4. Potassium.  5. Lasix.  6. Advair Diskus.  7. Albuterol.   ALLERGIES:  Reported as ASPIRIN and TETRACYCLINE.   SOCIAL HISTORY:  Lives in [place] with his wife.  He is a Presenter, broadcasting.  He has one child.  He continues to smoke half pack of  cigarettes a day.  Alcohol, none since 1998.   FAMILY HISTORY:  Mother died with cancer.  Father died at age 42 with  MI.  PHYSICAL EXAM:  GENERAL:  He is somewhat short of breath.  VITAL SIGNS:  Temperature was reported as 99.7, blood pressure is  147/96, heart rate was 112, and respirations were 24.  He was satting  95% on room air.  HEENT:  Normal.  NECK:  Supple.  JVD was not commented on.  CARDIAC:  PMI was laterally displaced.  It was irregular and  tachycardic.  No obvious murmurs.  CHEST:  Lungs have decreased air movement throughout with rales at the  bases.  No wheezing.  ABDOMEN:  Obese, nontender, and nondistended.  No guarding.  No  hepatosplenomegaly.  No bruits  EXTREMITIES:  Warm.  No cyanosis or clubbing.  There is 1+ edema  throughout.  NEURO:  He was alert and oriented x3.  Cranial nerves II-XII are grossly  intact.  Moves all four extremities without difficulty.   EKG showed atrial fibrillation with a rapid ventricular response.  White  count was 9.1, hemoglobin was 15.2, hematocrit is 46, platelets were  188, creatinine 1.4, CK-MB followup was less than 1, and troponin  0.028.   ASSESSMENT:  1. Atrial fibrillation with rapid ventricular response.  2. Probable acute bronchitis in the setting of known chronic      obstructive pulmonary disease.  3. History of coronary artery disease.  4. Mild congestive heart failure in the setting of #1.   PLAN:  We will admit him for rate control and treatment of upper  respiratory tract infection.  Consider TEE-guided cardioversion and  possible antiarrhythmic therapy.  He will be heparinized given his  subtherapeutic INR.      Bevelyn Buckles. Bensimhon, MD  Electronically Signed     DRB/MEDQ  D:  07/25/2007  T:  07/25/2007  Job:  [id number]

## 2010-08-05 NOTE — Assessment & Plan Note (Signed)
[facility]                          CHRONIC HEART FAILURE NOTE   JAAZIAH, SCHULKE                       MRN:          [id number]  DATE:11/05/2006                            DOB:          1945/06/27    PRIMARY CARDIOLOGIST:  Rollene Rotunda, M.D., [facility]   ELECTROPHYSIOLOGIST:  Doylene Canning. [name], M.D.   PRIMARY CARE DOCTOR:  Gordy Savers, M.D.   Mr. Rosevear returns today for followup of his congestive heart failure,  which is secondary to ischemic cardiomyopathy with an EF currently of 40-  45%.  Mr. Fedewa also has new onset of atrial fibrillation status post  recent hospitalization at [facility] with plans at that time to proceed  with a cardioversion.  The patient underwent a TEE.  However, it showed  a left atrial thrombus.  Cardioversion was cancelled, and the patient  was started on Coumadin with the possibility of cardioversion at a later  date.  Mr. Pullara states that he has not followed up with Dr. [name] yet  post hospitalization.  He returns today with complaints of orthopnea  over the last few nights, increased shortness of breath with exertion,  and increased use of his inhaler.  I first saw Mr. Melamed as a new  patient on August 6.  Mr. Thammavong was stable at that time.  He states he  has done well since I saw him, until the last few days when he has  noticed increased peripheral edema and orthopnea.  He is not weighing at  home.  He states that they cannot find their bathroom scales at this  time, and his wife has been looking for them.  He also did not have a  PT/INR checked last week, as previously had been scheduled.  Will need  to have INR done at this time with lab work.  He states compliance with  his medications.  However, he has not taken his medicines yet today and  is hypertensive.   PAST MEDICAL HISTORY:  1. Includes congestive heart failure secondary to ischemic      cardiomyopathy with an EF currently 40-45%.  2.  Coronary artery disease status post PTCA and stent to the left      circumflex in 1997 and stent to the obtuse marginal in 1998.  3. New onset of atrial fibrillation with rapid ventricular response      status post TEE during recent hospitalization showing left atrial      appendage thrombus.  4. Anticoagulation therapy.  5. History of atrial flutter status post radiofrequency catheter      ablation in the past.  6. History of medical noncompliance.  7. History of EtOH abuse, quit approximately 3 years ago.  8. Previous tobacco use, just recently quit.  9. Dyslipidemia.  10.Renal insufficiency.  11.COPD.  12.Severe psoriasis of the lower extremities.  13.Diabetes.  14.Moderate mitral regurgitation.  15.Obesity.  16.Stress Myoview in 2005.  Negative for ischemia.  EF at that time      50%.  17.Dietary indiscretion.   REVIEW OF SYSTEMS:  As stated above.   CURRENT  MEDICATIONS:  1. Advair 100/50 one puff inhaled b.i.d.  2. Coumadin per the Coumadin clinic, although the patient apparently      has not had a level drawn since he was discharged from the hospital      on July 25.  3. Glyburide 2.5 mg b.i.d.  4. Metformin 500 mg b.i.d.  5. Lisinopril 5 mg daily.  6. Furosemide 40 mg daily.  7. Simvastatin 40 mg daily.  8. Metoprolol 100 mg b.i.d.  9. K-Dur 20 mEq daily.   P.r.n. medications include tramadol.   CLINICAL DATA:  A 12-lead EKG today shows atrial fibrillation at a rate  of 89.   PHYSICAL EXAM:  Weight 217.  Weight is up 1 pound from 2 weeks ago.  Blood pressure 169/115 in the left arm, repeat blood pressure 160/100 in  the left arm.  Note, the patient has not taken any of his medicines yet  this morning.  Heart rate 93.  Mr. Degeorge is in no acute distress.  He has JVP 10-12 cm at a 45 degree angle.  LUNGS:  He has scattered rhonchi throughout with crackles in the left  base.  CARDIOVASCULAR:  Reveals an S1 and S2.  Irregularly irregular.  ABDOMEN:  Soft.   Nontender.  Obese.  Positive bowel sounds.  LOWER EXTREMITIES:  The patient has +2 pitting edema bilaterally.  NEUROLOGIC:  Alert and oriented.   IMPRESSION:  Heart failure secondary to ischemic cardiomyopathy and  recent hospitalization for acute onset secondary to atrial fibrillation  with rapid ventricular response, now with left atrial thrombus,  cardioversion pending.  The patient has volume overload at this time.  We will increase his Lasix to 40 mg b.i.d.  Check blood work today,  including a PT/INR.  I have encouraged the patient to bring his  medications with him when he comes for his appointment, and also to take  his medications prior to his appointment with me so that I can gauge  whether or not his blood pressure medicine is therapeutic.  We will  continue other medications.  Have the  patient return for weekly PT/INR and follow up with Dr. [name] in the  next 2 to 3 weeks, and I will see him after that appointment.      Dorian Pod, ACNP  Electronically Signed      Rollene Rotunda, MD, [facility]  Electronically Signed   MB/MedQ  DD: 11/05/2006  DT: 11/05/2006  Job #: [id number]   cc:   Gordy Savers, MD

## 2010-08-05 NOTE — Assessment & Plan Note (Signed)
[facility]                          CHRONIC HEART FAILURE NOTE   [name], [name]                       MRN:          [id number]  DATE:10/27/2006                            DOB:          [birth year]    PRIMARY CARDIOLOGIST:  Rollene Rotunda, MD, [facility]   ELECTROPHYSIOLOGIST:  Doylene Canning. [name], MD   PRIMARY CARE PHYSICIAN:  Gordy Savers, MD   [name] is new to the Heart Failure Clinic. [name] is a 65 year old  Caucasian gentleman with known history of coronary artery disease status  post myocardial infarction and interventions to the left circumflex in  1997 and a stent to the obtuse marginal in 1998. [name] also has a  history of ETOH abuse and just recently quit within the last three  years. Tobacco use-quit approximately one year ago. He presented to  [facility] Room on this recent admission complaining of  increased shortness of breath x2 weeks with productive cough, fever and  chills. The patient had an EKG done that showed atrial fibrillation with  rapid ventricular response. Chest x-ray showed mild congestion. The  patient was seen by Dr.  Lewayne Bunting and started on IV Cardizem. He was  anticoagulated with heparin with plans to proceed with a TEE guided  cardioversion after initiating anti-arrhythmic therapy with Tikosyn. The  patient was also treated with antibiotics and diagnosed with congestive  heart failure. The patient responded well to diuretic therapy. He was  scheduled for a TEE cardioversion on September 23, 2006. However, TEE showed  the patient has a left atrial thrombus. The cardioversion was cancelled.  The patient was started on Coumadin with possibility of cardioversion at  a later date. The patient was scheduled to followup here in the Heart  Failure Clinic to establish care. He returns today with his wife. The  patient states that the diagnosis of heart failure has been somewhat  overwhelming for him. He  and his wife are slightly frustrated. The  patient states that he has a history of atrial flutter and had an  ablation done and he thought this would fix his heart problem  '. We  discussed the difference in atrial flutter and the atrial fibrillation.  We also talked about his previous ETOH use, his noncompliance to  medications, hypertension and his tobacco use as concerning in addition  to his coronary artery disease in regards to his heart failure. The  patient and his wife seem to have a very poor understanding of the  significance of his diagnosis/mortality. It appears that [name] has  been a patient here for several years, but has not always been compliant  in his appointments or medication use. He states that since he was  discharged home, he continues to remain shortness of breath with  exertion. He is trying to return to work. 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 He states  that any more than 20 hours and he is exhausted. He continues  to sleep  on two pillows at night. He always sleeps on his right side. He states  that he cannot sleep any other way. He does have mild orthopnea at times  and occasionally sleeps in the recliner. He has continued to abstain  from ETOH and tobacco products. He denies ever using any recreational  substances. We discussed his diet. The patient apparently uses a lot of  salt in his diet, IE, he states that he can sit down and eat a whole bag  of potato chips and he does not like the low salt potato chips. He also  has been using a salt substitute.   PAST MEDICAL HISTORY:  1. Congestive heart failure with ejection fraction of 40-45%, with      left ventricular wall thickness, moderately to markedly increased.  2. Atrial fibrillation with rapid ventricular response status post TEE      showing left atrial appendage thrombus.  3.  Anticoagulation therapy.  4. Diabetes.  5. Chronic cough.  6. Severe psoriasis of the lower extremities.  7. Chronic obstructive pulmonary disease.  8. Previous tobacco use.  9. History of ETOH abuse, quit 3 years ago.  10.Renal insufficiency.  11.Dyslipidemia.  12.Coronary artery disease, status post PTA and stent to the left      circumflex in 1997 and stent to the obtuse marginal in 1998.  13.Atrial flutter, status post radiofrequency catheter ablation.  14.History of medical noncompliance.  15.Moderate mitral regurgitation.   REVIEW OF SYSTEMS:  As stated above.   CURRENT MEDICATIONS:  1. Advair 250/50 inhaled b.i.d.  2. Coumadin per Coumadin Clinic.  3. Glyburide 2.5 mg b.i.d.  4. Glucophage 500 mg b.i.d.  5. Lisinopril 5 mg daily.  6. Furosemide 40 mg daily.  7. Simvastatin 40 mg daily.  8. __________ 100 mg b.i.d.  9. K-Dur, the patient states that he is taking K-Dur 20 mEq daily.      However, it is not listed on his discharge medication list or in      the dictated discharge summary from his recent hospitalization.  10.The patient states that he is also on his antibiotic Augmentin      875/125 b.i.d. for four more days.   REVIEW OF SYSTEMS:  As stated above.   ALLERGIES:  ASPIRIN AND TETRACYCLINE.   PHYSICAL EXAMINATION:  Weight 216, blood pressure 148/88, heart rate 77.  [name] is in no acute distress.  HEENT: Unremarkable.  NECK: No jugular vein distention noted.  LUNGS:  He has scattered rhonchi bilateral upper lobes otherwise clear  to auscultation.  CARDIOVASCULAR: Patient irregular irregular, controlled rate.  ABDOMEN: Soft and nontender. Positive bowel sounds.  LOWER EXTREMITIES: Without clubbing, cyanosis or edema.  NEUROLOGIC: The patient is alert and oriented x3.  SKIN: He does have severe psoriasis bilateral lower legs.   IMPRESSION:  Congestive heart failure secondary to ischemic  cardiomyopathy in the setting of known history of ETOH abuse,   hypertension-uncontrolled and medication noncompliance.   CLINICAL DATA:  Lab work obtained on October 18, 2006:  Potassium 4.0, BUN  33, creatinine 1.3.  Walk test: Room air 97% with a heart rate of 76.  The patient ambulated greater than 300 feet. Pulse ox dropped to lowest  at 95% with a heart rate of 93 with exertion. The patient denied  lightheadedness, dizziness, chest discomfort or palpitations.   Will continue current medications. I have initiated heart failure  education with both the patient and his wife. The patient will have  lab  work repeated today including a PT/INR. The results of which will be  faxed or passed on to the Coumadin Clinic. The patient is also  complaining of some constipation and I have recommended that he try  stool softeners q nightly p.r.n. I will see the patient back in a few  weeks for further evaluation and then arrange for the patient to  followup with Dr.  Lewayne Bunting for further evaluation of his atrial  fibrillation. Hopefully, Dr.  [name] will be able to explain to the  patient's wife more clearly the difference in atrial flutter and atrial  fibrillation and his wife seems to be more focused on the fact that he  has returned to that bad heart rhythm that we were supposed to fix and  he is again having to take that rat poison. I cannot seem to make her  understand there is a difference in the heart rhythm and the rhythm that  he had ablated has been corrected.      Dorian Pod, ACNP  Electronically Signed      Bevelyn Buckles. Bensimhon, MD  Electronically Signed   MB/MedQ  DD: 10/27/2006  DT: 10/27/2006  Job #: [id number]   cc:   Doylene Canning. [name], MD  Gordy Savers, MD

## 2010-08-05 NOTE — Assessment & Plan Note (Signed)
[facility]                         ELECTROPHYSIOLOGY OFFICE NOTE   ANISH, VANA                       MRN:          [id number]  DATE:12/28/2007                            DOB:          1945-05-27    Mr. Decelles returns today for followup.  He is a pleasant 65 year old man  with paroxysmal/persistent atrial fibrillation who has been maintained  largely in sinus rhythm on amiodarone therapy.  He also has a  nonischemic cardiomyopathy and congestive heart failure class II.  His  main complaint today is not palpitations but rather fatigue and weakness  and lack of energy.  He has had no significant chest pain.  He denies  peripheral edema.  He has had no syncope.   MEDICINES:  1. Coumadin as directed.  2. Glyburide and metformin as directed.  3. Lisinopril 5 twice a day.  4. Simvastatin 40 a day.  5. Metoprolol 100 twice a day.  6. Lasix 40 twice a day.  7. Potassium 20 mEq daily.  8. Amiodarone 200 mg 1 tablet in the morning and 1/2 tablet in the      evening.   PHYSICAL EXAMINATION:  He is a pleasant, middle-aged, obese man in no  acute distress.  Blood pressure today was 130/70, the pulse was 49 and  regular, the respirations were 18, the weight was 211 pounds.  The neck  revealed no jugular venous distention.  Lungs are clear bilaterally to  auscultation.  No wheezes, rales, or rhonchi are present.  Cardiac exam  revealed a regular bradycardia with normal S1 and split S2.  Abdominal  exam was obese, nontender, and nondistended.  Extremities demonstrated  no edema.   EKG demonstrates sinus bradycardia with right bundle-branch block and  prior inferior MI pattern.   IMPRESSION:  1. Paroxysmal/persistent atrial fibrillation maintaining sinus rhythm      on fairly high doses of amiodarone.  2. Ischemic cardiomyopathy with ejection fraction of 30%.  3. Class II-III heart failure.  4. Sinus bradycardia likely contributed to by  amiodarone.   DISCUSSION:  Overall, Mr. Madlock is stable.  I have offered him  prophylactic defibrillator insertion with a dual-chamber ICD.  The  patient is considering these options and will call us to schedule  defibrillator.  I have also asked to decrease his amiodarone a day from  300-200 mg  daily.  He will continue on with his beta-blocker therapy despite his  bradycardia.  I think, a  portion of his fatigue and weakness are related to bradycardia and I  have told him this.  I have also told him that he is at risk for sudden  death which could be prevented with the defibrillator.     Doylene Canning. [name], MD  Electronically Signed    GWT/MedQ  DD: 12/28/2007  DT: 12/29/2007  Job #: [id number]

## 2010-08-05 NOTE — Assessment & Plan Note (Signed)
[facility]                          CHRONIC HEART FAILURE NOTE   Jon, [name]                       MRN:          [id number]  DATE:12/30/2006                            DOB:          06-17-1945    [name] returns today for followup of his congestive heart failure,  which is secondary to ischemic cardiomyopathy with an EF currently 40%  to 45%.  [name] also has new onset atrial fibrillation, which has  exacerbated his heart failure.  He just saw Dr. Ladona [name] in September.  They made the decision at that time to try medication therapy.  Patient  was started on amiodarone.  [name] states his shortness of breath has  become much more pronounced since he started on the amiodarone.  He has  gone from sleeping from 1 to 3 pillows.  He complains of increased  fatigue, weakness, and weight gain of 5 pounds.  He denies any  presyncope or syncopal episodes, chest discomfort, lightheadedness, or  dizziness.   PAST MEDICAL HISTORY:  1. Congestive heart failure secondary to ischemic cardiomyopathy with      an EF of 40% to 45%.  2. New onset atrial fibrillation status post TE showing left atrial      appendage thrombus.  Patient currently on anticoagulation therapy      and amiodarone therapy by Dr. Lewayne Bunting.  3. Coronary artery disease status post PTCA and stent to the left      circumflex in 1997, and stent to the obtuse marginal in 1998.  4. History of atrial flutter status post radiofrequency catheter      ablation.  5. History of medical noncompliance.  6. History of ETOH abuse.  7. Previous tobacco use.  8. Dyslipidemia.  9. Renal insufficiency.  10.COPD.  11.Severe psoriasis of lower extremities.  12.Diabetes.  13.Moderate MR.  14.Obesity.  15.Stress Myoview in 2005 negative for ischemia.  16.Dietary indiscretion.   REVIEW OF SYSTEMS:  As stated above.   CURRENT MEDICATIONS:  Include:  1. Advair 100/50 one puff b.i.d.  2.  Coumadin per [facility].  3. Glyburide 2.5 b.i.d.  4. Metformin 500 b.i.d.  5. Lisinopril 5 mg b.i.d.  6. Simvastatin 40.  7. Metoprolol 100 b.i.d.  8. K-Dur 20 daily.  9. Lasix 40 b.i.d.  10.Amiodarone 400 mg b.i.d.   CLINICAL DATA:  A 12-lead EKG today showing atrial fibrillation at a  rate of 77.   PHYSICAL EXAMINATION:  Weight 219.  Blood pressure 139/82 with a heart  rate of 84.  Patient is saturating 92% on room air.  JVD 8 cm to 10 cm at 45 degree angle.  LUNGS:  He has fine crackles bilateral bases with expiratory wheezes in  right upper lobe.  CARDIOVASCULAR EXAM:  Reveals an S1 and S2, irregular.  ABDOMEN:  Obese.  Soft and nontender with positive bowel sounds.  LOWER EXTREMITIES:  Without clubbing or cyanosis.  He has 1+ pitting  edema with the right being greater than left.  NEUROLOGIC:  Alert and oriented x3.   IMPRESSION:  Congestive heart failure with  signs of volume overload at  this time in the setting of symptomatic atrial fibrillation.  I am going  to increase his furosemide to 80 mg in the a.m. and 40 mg in the  evening.  Check blood work in 1 week, and have patient follow up the  following week with Dr. Ladona [name] as already arranged.  [name] knows to  call me if he has any problems or fluid issue does not resolve.      Dorian Pod, ACNP  Electronically Signed      Rollene Rotunda, MD, [facility]  Electronically Signed   MB/MedQ  DD: 12/30/2006  DT: 12/30/2006  Job #: [id number]

## 2010-08-05 NOTE — Discharge Summary (Signed)
NAMEONESIMO, LINGARD                ACCOUNT NO.:  [id number]   MEDICAL RECORD NO.:  [id number]          PATIENT TYPE:  INP   LOCATION:  2021                         FACILITY:  [facility]   PHYSICIAN:  Maple Mirza, PA   DATE OF BIRTH:  December 13, 1945   DATE OF ADMISSION:  10/09/2006  DATE OF DISCHARGE:                               DISCHARGE SUMMARY   [name] here at [facility].  The nurses directed attention to  psoriatic plaques covering both legs and also affecting to some extent  the facial area.  The patient reported that he had been placed on a  topical preparation about 1 month ago by Dr. Amador Cunas.  It was  requested that his wife bring in the preparation but she has not  appeared so far. This is a request entered 36 hours ago.  Thinking about  his care for that, suggestions made here would be to continue with  clobetasol propionate 0.05% ointment to apply at night to the psoriatic  plaques.  In the daytime, to apply a paralytic  such as a lactic acid  based AmLactin for example and then to try this for 4 weeks and  reevaluate.  If not adequate response, he could have a short contact  retinoid such as tazarotene 0.05% to apply 3 minutes before a shower and  only on plaques and then to work up to 10 minutes maximum before  showering.  After a 4 week period of this treatment, he would then be  reassessed and probably put on a evening dose of Taclonex which is a  steroid D3 combination preparation.  These are just suggestions to help  Mr. Wheeland with his psoriatic plaques.      Maple Mirza, PA     GM/MEDQ  D:  10/14/2006  T:  10/14/2006  Job:  [id number]   cc:   Gordy Savers, MD

## 2010-08-05 NOTE — Discharge Summary (Signed)
[name], [name]                ACCOUNT NO.:  [id number]   MEDICAL RECORD NO.:  [id number]          PATIENT TYPE:  INP   LOCATION:  2021                         FACILITY:  [facility]   PHYSICIAN:  Rollene Rotunda, MD, FACCDATE OF BIRTH:  [birth year]   DATE OF ADMISSION:  10/09/2006  DATE OF DISCHARGE:  10/15/2006                               DISCHARGE SUMMARY   PRIMARY CARDIOLOGIST:  Rollene Rotunda, MD.   CONSULTING PHYSICIAN:  Lewayne Bunting, MD.   PRIMARY CARE PHYSICIAN:  [name], MD.   PROCEDURES PERFORMED DURING HOSPITALIZATION:  None.   PRIMARY DISCHARGE DIAGNOSES:  1. Left atrial appendage thrombus.  2. Atrial fibrillation with rapid ventricular rate, now rate      controlled.  3. Acute congestive heart failure with an ejection fraction 40 to 45%      with left ventricular wall thickness moderately to markedly      increased.  4. Diabetes.  5. Chronic cough.  6. Severe psoriasis of the lower extremities.  7. COPD.  8. Known CAD.      a.     Status post PTA and stent to the left circumflex 1997.      b.     Stent to obtuse marginal in 1998.  9. Dyslipidemia.  10.Renal insufficiency.  11.A history of tobacco abuse.  12.A significant history of ETOH abuse.   HOSPITAL COURSE:  This is a 65 year old male patient who presented to  the emergency room on October 09, 2006 with complaints of difficulty  breathing which has been progressive over the last 2 weeks prior to  admission with a productive yellow cough and fevers on and off.  The  patient was seen by ER physician.  EKG was completed revealing atrial  fibrillation with RVR.  The patient also had a chest x-ray revealing  moderate cardiomegaly and mild venous congestion.  We were asked to see  patient secondary to these, and the patient was see and examined by Dr.  Lewayne Bunting in the emergency room, and the patient was subsequently  placed on IV Cardizem, IV heparin with cycling of cardiac enzymes with  plan for  TEE-guided DCCV after initiating antiarrhythmic therapy on  Tikosyn.   The patient was also treated with antibiotics secondary to lung  congestion and also had been diagnosed with CHF.  The patient was  started on IV Cardizem, IV digoxin, and bronchodilators.  The patient  was also started on p.o. Coumadin with heparin bridging.   The patient was also given IV Lasix throughout hospitalization for  diuresis.  The patient was seen by Dr. Antoine Poche and Dr. [name]  throughout hospitalization.  On October 12, 2006, the patient was planned  for a [facility] cardioversion.  The patient had decreased breath sounds, but  breathing status had improved significantly.  The patient's heart rate  was well controlled on the IV Cardizem.  The patient had had plans  scheduled for TEE cardioversion on October 13, 2006; however, a  transesophageal echocardiogram was completed and found to have a left  atrial thrombus, so cardioversion was canceled.  The patient was  to be  continued on Coumadin for several weeks with possibility of a  cardioversion at Dr. Lubertha Basque discretion.   It was also found the patient had extensive lower extremity psoriasis.  Suggestions were made by Loura Pardon, PA, for suggestions for appropriate  medications which are provided on a separate dictation.  These were to  include clobetasol propionate at 0.5% ointment during the daytime and a  lactic-acid-based AmLactin at nighttime.  This is to be followed by Dr.  Amador Cunas and possibly dermatologist at his discretion.   On day of discharge, the patient was seen and examined by Dr. Antoine Poche.  It was found that he was mildly hyperkalemic with a potassium of 5.0,  his potassium was held, and he is planned to have a follow-up BMET on  October 18, 2006 with a follow-up Coumadin clinic appointment to check  PT/INR institution of Coumadin which is new for him.  The patient was  feeling much better, his Cardizem was continued, and he was placed on   Lopressor 100 mg twice a day.  The patient also had addition of  lisinopril 5 mg daily and Tussionex for continued cough twice a day.  The patient was also placed on Advair 250/50 twice a day for help with  breathing status.  The patient is also to continue on his home dose of  Glucophage and glipizide.   The patient will be placed in our outpatient Coumadin clinic and will be  followed up by Mrs. Dorian Pod, nurse practitioner, as a new  patient for continued medical management of congestive heart failure and  patient teaching and monitoring.  Followup with Dr. Lewayne Bunting for re-  discussion of TEE cardioversion will be made as Mrs. Bednar's  discretion.   VITAL SIGNS ON DISCHARGE:  Blood pressure 147/87, pulse 69, respirations  18, temperature 97.4, O2 sat 94% on room air.  The patient was not  weighed during this hospitalization.   DISCHARGE LABS:  Hemoglobin 16.4, hematocrit 49.2, white blood cells  9.3, platelets 223.  PT 33.0, INR 3.0.  Sodium 140, potassium 5.0,  chloride 98, CO2 of 31, glucose 163, BUN 32, creatinine 1.35.  Cardiac  markers were found to be mildly elevated at 0.25.  Echocardiogram dated  October 12, 2006 revealed left ventricular size normal, overall left  ventricular systolic function mildly decreased with an LVEF of 40 to  45%, mild diffuse left ventricular hypokinesis.  Left ventricular wall  thickness was moderately to markedly increased.  Aortic valve thickness  was mildly increased.  There was trivial aortic valvular regurgitation.  The aortic root was at upper limits of normal in size.  There was mild  mitral valvular regurgitation.  Left atrium was mildly dilated.  Right  atrium was mildly dilated.  There was a small pericardial effusion.   Transesophageal echocardiogram completed on October 14, 2006 revealed LV  function mildly depressed, mild thickening of the mitral valve with  prolapse, and malcoarctation of the anterior leaflet.  There was mild  MR  1 to 2/4.  LA has extensive spontaneous contrast (smoke), a LA appendage  is large and suspicious for thrombus.  EKG revealed atrial fibrillation  with a ventricular rate of 70 beats per minute with a right bundle  branch block and ST abnormality noted laterally.   DISCHARGE MEDICATIONS:  1. Coumadin 2.5 mg on day of discharge followed by Coumadin 5 mg on      Saturday and Sunday, July 26 and 27 with follow-up PT/INR on  July      28.  2. Advair 250/50 twice a day inhaler.  3. Zocor 40 mg at bedtime.  4. Lopressor 100 mg twice a day.  5. Glucophage 500 mg b.i.d.  6. Glyburide 2.5 mg twice a day.  7. Lasix 40 mg daily.  8. Lisinopril 5 mg daily.  9. Tussionex 5 mL b.i.d. p.r.n. cough.   (Potassium has been placed on hold until BMET is completed on July 28  secondary to hyperkalemia with reinstitution of potassium at Ms. Bednar  or Dr. Jenene Slicker discretion).   ALLERGIES:  ASPIRIN, TETRACYCLINE, CEFTIN.   FOLLOWUP PLANS AND APPOINTMENTS:  1. The patient will have a BMET and PT/INR scheduled on July 28 at      2:45 p.m.  2. The patient will follow up with Dorian Pod, nurse      practitioner, on August at 9:20 p.m.  3. The patient is to follow up with his primary care physician for      continued medical management of diabetes and COPD, Dr. Amador Cunas.  4. The patient has been given post-discharge instructions to be      weighed daily on wearing the same clothes each morning and to      report any weight gain or swelling.  The patient has been advised      on all of his new medications and need for medical compliance.      Prescriptions for above medications have all been provided.   Time spent with the patient to include physician time 50 minutes.      Bettey Mare. Lyman Bishop, NP      Rollene Rotunda, MD, [facility]  Electronically Signed    KML/MEDQ  D:  10/15/2006  T:  10/15/2006  Job:  [id number]   cc:   Gordy Savers, MD

## 2010-08-05 NOTE — Assessment & Plan Note (Signed)
[facility]                         ELECTROPHYSIOLOGY OFFICE NOTE   GIAVONNI, FONDER                       MRN:          [id number]  DATE:11/19/2006                            DOB:          [birth year]    [name] returns today for followup.  He is a very pleasant middle-aged  male with a history of atrial flutter status post ablation, history of  recurrent atrial fibrillation, history of coronary disease, and a  history of chronic Coumadin therapy and medical noncompliance.  He was  hospitalized several weeks ago with atrial fibrillation and heart  failure symptoms, and unfortunately when he underwent transesophageal  echocardiogram for potential cardioversion, he was found to have left  atrial appendage thrombus.  He has been back on Coumadin and and is in  our Coumadin clinic since.  The patient denies chest pain.  He continues  to have class 2-3 heart failure symptoms.   PHYSICAL EXAM:  He is a pleasant, chronically ill-appearing middle-aged  man in no distress.  Blood pressure was 128/80, the pulse 86 and irregularly regular.  Respirations were 18, and the weight was 217 pounds.  NECK:  7 cm jugular venous distention.  There was no thyromegaly.  LUNGS:  Rales in the bases bilaterally.  There were no wheezes or  rhonchi present.  CARDIOVASCULAR:  Irregular regular with normal S1 and S2.  There was a  soft systolic murmur at the left lower sternal border.  The PMI was  enlarged.  EXTREMITIES:  Trace peripheral edema bilaterally.   MEDICATIONS:  1. Coumadin as directed.  2. Metformin.  3. Glyburide.  4. Advair.  5. Lisinopril 5 daily.  6. Furosemide 40 daily.  7. Simvastatin 40 daily.  8. Metoprolol 100 twice daily.   EKG demonstrates atrial fibrillation with a controlled ventricular  response.   IMPRESSION:  1. Persistent atrial fibrillation.  2. Congestive heart failure (diastolic greater than systolic).  3. Hypertension.  4.  Coronary artery disease.   DISCUSSION:  Overall, Mr. Pertuit is stable with rate control but would  certainly do better from a heart failure perspective if he could be  maintained back on sinus rhythm.  To this end, I  advised him to continue his Coumadin and that we plan to proceed with DC  cardioversion in  several weeks.  He may ultimately require some amiodarone, at least in  the short term to help him maintain sinus rhythm.  We will schedule him  for cardioversion in the next several weeks.     Doylene Canning. [name], MD  Electronically Signed    GWT/MedQ  DD: 11/19/2006  DT: 11/21/2006  Job #: [id number]

## 2010-08-05 NOTE — Assessment & Plan Note (Signed)
[name]                         ELECTROPHYSIOLOGY OFFICE NOTE   [name], [name]                       MRN:          [id number]  DATE:01/11/2007                            DOB:          12/12/1945    [name] returns today for followup.  He is a very pleasant middle-aged  man with a multitude of medical problems, including diabetes,  hypertension, atrial fibrillation, and congestive heart failure.  He has  coronary artery disease, status post MI.  He has moderate LV dysfunction  with an EF of approximately 45%.  The patient was started on Coumadin  and then started on amiodarone several weeks ago.  He returns today for  followup.  He notes that in the last week, he feels better.  He denies  chest pain.  He denies shortness of breath.  He does note that he is a  little weaker still than he normally is but all in all has improved.   PHYSICAL EXAMINATION:  He is a pleasant, somewhat unkempt-appearing  middle-aged man in no distress.  Blood pressure is 132/70.  Pulse was 44 and regular.  Respirations were  16.  The weight was 207 pounds.  NECK:  No jugular venous distention.  LUNGS:  Clear bilaterally to auscultation.  There are no wheezes, rales  or rhonchi present.  CARDIOVASCULAR:  Regular bradycardia with a split S2.  EXTREMITIES:  Trace peripheral edema.   His EKG demonstrated marked sinus bradycardia with first-degree AV block  and right bundle branch block.   IMPRESSION:  1. Ischemic heart disease, status post myocardial infarction.  2. Atrial fibrillation with rapid ventricular response.  3. Diabetes.  4. Hypertension.  5. Chronic amiodarone therapy.   DISCUSSION:  Overall, [name] is stable.  He has returned back to  sinus rhythm.  His Coumadin levels have been therapeutic.  I have  recommended that he continue on his amiodarone at 200 mg twice daily.  I  have decreased his dose of metoprolol today from 100 twice daily to 50  mg in the morning and 100 mg in the evening.  Additional decrements in  his dose of beta blockers as well as amiodarone will be forthcoming.  I  will plan to see the patient back in three months, at which time if he  is maintained in sinus rhythm, I will decrease his  amiodarone dose to 300 mg a day.  If he is still severely bradycardic,  decrease his metoprolol to 50 twice daily.     Doylene Canning. [name], MD  Electronically Signed    GWT/MedQ  DD: 01/11/2007  DT: 01/11/2007  Job #: [id number]   cc:   Gordy Savers, MD

## 2010-08-05 NOTE — Assessment & Plan Note (Signed)
[facility]                          CHRONIC HEART FAILURE NOTE   WADIE, LIEW                       MRN:          [id number]  DATE:11/24/2006                            DOB:          1945/04/03    HISTORY OF PRESENT ILLNESS:  Mr. Weatherholtz returns today for follow up of  congestive heart failure which is secondary to ischemic cardiomyopathy  with EF currently 40-45%.  Mr. Magro also has new onset of atrial  fibrillation status post recent hospitalization pending outpatient  Cardioversion.  During recent hospitalization, the patient had a TEE  that showed left atrial thrombus.  The patient was started on Coumadin.  He followed up with Dr. [name] on November 19, 2006.  Dr [name] noted the  patient's heart rate was stable, but the patient could benefit from  being maintained in sinus rhythm in regards to heart failure management.  The patient is scheduled for cardioversion on September 19.  Mr. Demmer  states he has been doing well.  He complains of mild shortness of breath  at times.  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  He complains of mild peripheral edema.  Denies any orthopnea.  He has been more aware of his sodium intake and trying to restrict the  amount of salt in his diet.  He has also been more aware of his diet in  regards to sugar intake.   PAST MEDICAL HISTORY:  1. Congestive heart failure secondary to ischemic cardiomyopathy with      EF of 40-45%.  2. New onset of atrial fibrillation status post TEE showing left      atrial appendage thrombus.  Patient currently on anticoagulation      therapy pending Cardioversion scheduled for September 19.  3. Coronary artery disease status post PTCA and stent to the left      circumflex in 1997 and stent to the obtuse marginal in 1998.  4. History of atrial flutter status post radiofrequency catheter      ablation.  5. History of medical noncompliance.  6. History of  ETOH abuse, quit approximately three years ago.  7. Previous tobacco use.  8. Dyslipidemia.  9. Renal insufficiency.  10.COPD.  11.Severe psoriasis of the lower extremity.  12.Diabetes.  13.Moderate mitral regurgitation.  14.Obesity.  15.Stress Myoview in 2005, negative for ischemia, EF at that time 50%.  16.Dietary indiscretion.   REVIEW OF SYSTEMS:  As stated above.   CURRENT MEDICATIONS:  1. Advair 100/50 b.i.d.  2. Coumadin per Coumadin clinic.  3. Glyburide .  4. Metformin 2.5/500 b.i.d.  5. Lisinopril 5 mg daily.  6. Simvastatin 40 mg daily.  7. Metoprolol 100 mg b.i.d.  8. K-Dur 20 mEq daily.  9. Lasix 40 mg b.i.d.  10.Tramadol p.r.n.   PHYSICAL EXAMINATION:  GENERAL:  Mr. Pickering is no acute distress.  VITAL SIGNS:  Weight stable at 218, blood pressure 149/92, heart rate  74.  HEENT:  Unremarkable.  NECK:  Supple.  NECK:  JVD at 8-10 cm at a 45 degree angle.  LUNGS:  Clear  to auscultation.  CARDIOVASCULAR:  S1, S2, irregular.  ABDOMEN:  Soft, nontender, positive bowel sounds, obese.  EXTREMITIES:  Lower extremities with +1 pitting edema bilaterally.  NEUROLOGICAL:  Alert and oriented x3.   IMPRESSION:  Congestive heart failure with mild volume overload.  At  this time, I am going to have the patient increase his Lasix to 80 mg in  the a.m. and 40 in the evening for the next two days and then resume his  40 mg b.i.d.  The patient's blood pressure is still not optimal.  I am  going to increase his Lisinopril to 10 mg daily.  The patient will  return early next week for repeat B-met, BMP.  Atrial fibrillation rate  is controlled at this time.  Patient pending cardioversion later this  month.  Will see the patient back in follow up after his cardioversion.  Will touch base with him next week regarding the results of his lab  work.      Dorian Pod, ACNP  Electronically Signed      Bevelyn Buckles. Bensimhon, MD  Electronically Signed   MB/MedQ  DD:  11/24/2006  DT: 11/24/2006  Job #: [id number]   cc:   Gordy Savers, MD  Rollene Rotunda, MD, [facility]  Doylene Canning. [name], MD

## 2010-08-07 ENCOUNTER — Ambulatory Visit (INDEPENDENT_AMBULATORY_CARE_PROVIDER_SITE_OTHER): Payer: Self-pay | Admitting: *Deleted

## 2010-08-07 DIAGNOSIS — I4892 Unspecified atrial flutter: Secondary | ICD-10-CM

## 2010-08-07 DIAGNOSIS — I4891 Unspecified atrial fibrillation: Secondary | ICD-10-CM

## 2010-08-07 LAB — POCT INR: INR: 3.5

## 2010-08-08 NOTE — Discharge Summary (Signed)
NAMEMARKOS, THEIL                ACCOUNT NO.:  [id number]   MEDICAL RECORD NO.:  [id number]          PATIENT TYPE:  INP   LOCATION:  3705                         FACILITY:  [facility]   PHYSICIAN:  [name], M.D.     DATE OF BIRTH:  22-Jun-1945   DATE OF ADMISSION:  06/12/2005  DATE OF DISCHARGE:  06/16/2005                                 DISCHARGE SUMMARY   TIME AT DISCHARGE:  42 minutes.   PROCEDURE:  None.   PRIMARY DIAGNOSIS:  Bradycardia/hypotension.   SECONDARY DIAGNOSES:  1.  Atrial flutter status post transesophageal echocardiogram cardioversion      in July 2005.  2.  Atrial fibrillation.  3.  Status post myocardial infarction with stent to the circumflex in 1997.  4.  Status post stent to the obtuse marginal in 1998.  5.  History of congestive heart failure.  6.  Diabetes.  7.  Chronic obstructive pulmonary disease.  8.  Hypertension.  9.  Tobacco.  10. History of ethanol.  11. Hyperlipidemia.  12. History of noncompliance.  13. History of moderate mitral regurgitation and tricuspid regurgitation by      echo.  14. Obesity.  15. Status post cardiac catheterization in December 2006, with noncritical      coronary artery disease and a wedge of 32, medical therapy recommended.  16. Allergy or intolerance to aspirin and Doxycycline with hives.  17. Family history of coronary artery disease in his mother.  18. Upper respiratory infection on antibiotics this admission.  19. Acute renal insufficiency with a creatinine peak of 1.9.  20. Acute urinary retention, resolved.  21. Chronic anticoagulation with Coumadin, INR 1.7 at discharge and repeat      Coumadin check on Monday.   HOSPITAL COURSE:  Mr. Reyez is a 65 year old male with a history of atrial  flutter and coronary artery disease.  He was being scheduled for ablation of  his flutter and reported a two-week history of dizziness, weakness,  diaphoresis and shortness of breath.  He had no syncope.  He was seen  by Dr.  Myrtis Ser and his heart rate was in the 40s.  He was hypotension and febrile with  a temperature of 100.8.  He was given Atropine and epinephrine on arrival to  the emergency room and started on antibiotics as well as dopamine.  He was  admitted for further evaluation and treatment.   PROBLEM #1 -  BRADYCARDIA/HYPOTENSION:  Mr. Wintle had been on Cardizem and  digoxin prior to admission.  These were discontinued.  He had also been on  metoprolol at 100 mg b.i.d.  This was initially held, but then restarted at  a decreased dose.  He is to continue off of the calcium channel blocker and  the digoxin and continue on the beta-blocker at a decreased dose.  At  discharge, his vital signs were stable.   PROBLEM #2 -  CHRONIC OBSTRUCTIVE PULMONARY DISEASE EXACERBATION/UPPER  RESPIRATORY INFECTION:  He was started on antibiotics and transitioned to  Ceftin.  He is to continue them for six more days after discharge.  He  had  been using an albuterol MDI prior to admission and he had Advair added to  his medication regimen b.i.d.  He is okay to continue using the albuterol on  a p.r.n. basis.  His breathing was much improved by discharge.   PROBLEM #3 -  ANTICOAGULATION:  His Coumadin dose was adjusted by pharmacy  and was slightly subtherapeutic at discharge.  It was felt that he did not  need overlap with  heparin or Lovenox.  He is to resume his Coumadin at his  prior dose and get his INR rechecked on Monday.   PROBLEM #4 -  ISCHEMIC CARDIOMYOPATHY:  Mr. Subramaniam had no acute ischemic  symptoms and his cardiac enzymes were negative.  He had an EF of 40% by  echocardiogram  in 2006.  He was on Lasix 40 mg b.i.d. prior to admission  and this is currently being held because of his acute renal insufficiency.  He is cleared to resume his diabetes medications but the Lasix is on hold  for now.  He is to weigh himself daily and follow up in the office.   PROBLEM #5 -  ACUTE RENAL INSUFFICIENCY:  Mr.  Murrillo does not have a history  of renal insufficiency but he had been on Lasix prior to admission for  history of CHF.  On admission, his BUN was 36 with a creatinine of 2.5.  His  Lasix was held.  As his blood pressure improved, his renal insufficiency  improved and at discharge, his BUN was 15 with a creatinine of 1.3.  His  Lasix is currently discontinued but may have to be restarted later.  He is  encouraged to weight himself daily and report five pound gains.   PROBLEM #6 -  DIABETES:  Initially his metformin was held because of renal  insufficiency.  Since his creatinine is back down to normal, internal  medicine felt that it was okay to restart it.  He was also on glyburide and  Actos.  He is encouraged to follow a diabetic diet and follow up with his  primary care physician.   Mr.  Sooy had some urinary retention and it was felt that this was  contributing to his elevated BUN and creatinine.  Dr. Jonny Ruiz evaluated Mr.  Mothershead and felt that the urinary retention was secondary to his acute illness  plus or minus the epinephrine he had received for his bradycardia.  He was  started on Flomax and had a Foley for a time.  The Flomax was discontinued  and he is to follow up with his primary care physician.   By June 16, 2005, Mr. Gombert had greatly improved respirations.  He was  ambulating without chest pain or shortness of breath.  His heart rate was  well controlled and he was considered stable for discharge with outpatient  follow-up arranged.   DISCHARGE INSTRUCTIONS:  1.  His activity level is to be increased gradually.  2.  He is to follow up with Dr. Amador Cunas in one to two weeks.  3.  He is to see Dr. Jenene Slicker PA or nurse practitioner on June 30, 2005,      at 10:45.  4.  He is to stick to a diet that is low in salt and fat with diabetic      restrictions.   DISCHARGE MEDICATIONS: 1.  Ceftin 250 mg b.i.d. for six more days.  2.  Advair 100/50 one puff b.i.d.  3.   Metoprolol 50 mg  b.i.d.  4.  Digoxin, diltiazem and furosemide are on hold as well as potassium.  5.  Coumadin 5 mg Monday, Wednesday, Friday and 2.5 mg other days.  6.  Glyburide/metformin 2.5/500 mg b.i.d.  7.  Lipitor 10 mg a day.  8.  Actos 45 mg a day.  9.  Albuterol MDI p.r.n.      Theodore Demark, P.A. [facility]    ______________________________  [name], M.D.    RB/MEDQ  D:  06/16/2005  T:  06/17/2005  Job:  [id number]   cc:   Gordy Savers, M.D. [facility]  [address] [place]  [place] [address]

## 2010-08-08 NOTE — Discharge Summary (Signed)
[name], [name]                ACCOUNT NO.:  [id number]   MEDICAL RECORD NO.:  [id number]          PATIENT TYPE:  OIB   LOCATION:  4738                         FACILITY:  [facility]   PHYSICIAN:  Doylene Canning. [name], M.D.  DATE OF BIRTH:  [birth year]   DATE OF ADMISSION:  08/05/2005  DATE OF DISCHARGE:  08/05/2005                                 DISCHARGE SUMMARY   ALLERGIES:  ASPIRIN and DOXYCYCLINE.   PRINCIPAL DIAGNOSIS:  1.  Discharging day of electrophysiology study/radiofrequency catheter      ablation of typical atrial flutter.  2.  Chronic productive cough.   SECONDARY DIAGNOSES:  1.  Hypertension.  2.  Coronary artery disease.      1.  Status post PTCA/stents to the left circumflex 1997.      2.  Stent to the obtuse marginal 1998.  3.  Ejection fraction 40%.  4.  History of tobacco habituation.  5.  Significant history of ethanol use.  6.  Type 2 diabetes.  7.  Dyslipidemia.  8.  Renal insufficiency.   PROCEDURE:  Aug 05, 2005:  Electrophysiology study/radiofrequency catheter  ablation of typical counterclockwise atrial flutter with production of  atrial flutter isthmus bidiretional block, Dr. Lewayne Bunting.  The patient  has converted to sinus rhythm and remains in sinus rhythm.  Rate is between  70 and 80 in the post ablation period.  He is having no complaints except  for his chronic cough.  The catheterization sites are healing nicely.  There  is no evidence of hematoma.  He is asked to discharge on a low sodium, low  cholesterol, diabetic diet.  He is asked not to drive for the next two days,  not to lift anything heavier than 10 pounds for the next two weeks.   DISCHARGE MEDICATIONS:  1.  (New) Levaquin 750 mg daily for five days.  2.  Mucinex 600 mg two tablets in the morning, two tablets in the evening.  3.  Advair 100/50 twice daily.  4.  Metoprolol 50 mg twice daily.  5.  Glyburide 2.5 mg twice daily.  6.  Metformin 500 mg twice daily.  7.  Lipitor 10 mg  daily at bedtime.  8.  Actos 15 mg daily.  9.  Furosemide 400 mg twice daily.  10. K-Dur 20 mEq twice daily.   He will follow up [facility] [address] Thursday, June  7 at 10:45.   BRIEF HISTORY:  [name] is a 65 year old male.  He has a history of atrial  flutter, hypertension, coronary artery disease.  Patient has had trouble  keeping his INR therapeutic but in the last month his values have stabilized  and he is interested now in proceeding with ablation.  He does note dyspnea  on exertion and has had a chronic cough since diagnosis of pneumonia two  months ago.  Patient will proceed with elective admission for radiofrequency  catheter ablation.  Risks and benefits described to the patient.   HOSPITAL COURSE:  The patient presented May 16 electively to [facility].  He  underwent radiofrequency catheter ablation of a typical counterclockwise  atrial flutter.  He is now in sinus rhythm.  He has no complaints.  His INR  on presentation was 3.  His other laboratories are as follows:  Sodium 143,  potassium 3.7, chloride 105, carbonate 34, glucose is 120, BUN is 21,  creatinine 1.4.  In addition, the chest x-ray showed COPD and pulmonary  venous hypertension.      Maple Mirza, P.A.    ______________________________  Doylene Canning. [name], M.D.    GM/MEDQ  D:  08/05/2005  T:  08/06/2005  Job:  [id number]   cc:   Gordy Savers, M.D. [facility]  [address] [place]  [place] [address]

## 2010-08-08 NOTE — Discharge Summary (Signed)
NAMEBAYLEY, HURN                ACCOUNT NO.:  [id number]   MEDICAL RECORD NO.:  [id number]          PATIENT TYPE:  INP   LOCATION:  4705                         FACILITY:  [facility]   PHYSICIAN:  Olga Millers, M.D. LHCDATE OF BIRTH:  11/04/1945   DATE OF ADMISSION:  03/09/2005  DATE OF DISCHARGE:  03/15/2005                                 DISCHARGE SUMMARY   PRIMARY CARDIOLOGIST:  [name], M.D.   PRIMARY CARE PHYSICIAN:  Trish Fountain, M.D.   DISCHARGING CARDIOLOGIST:  Olga Millers, M.D. [facility]   PRINCIPAL DIAGNOSES:  1.  Shortness of breath.  2.  Congestive heart failure exacerbation.  3.  The patient with a new onset of atrial fibrillation.   PROCEDURE PERFORMED DURING THIS HOSPITALIZATION:  Include a 2D  echocardiogram on March 10, 2005 and a left heart catheterization  performed on March 11, 2005.  Transesophageal echocardiogram performed on  March 12, 2005.   ALLERGIES:  THE PATIENT HAS ALLERGY TO ASPIRIN WHICH GIVES HIM HIVES AND  DOXYCYCLINE WHICH ALSO CAUSES HIVES.   SECONDARY DIAGNOSES:  1.  Atrial flutter status post transesophageal echocardiogram with      cardioversion in July of 2005.  2.  Myocardial infarction in 1997, apparently done by [facility].  Mr.      Whitworth had a PTCA done by Dr. Lavonne Chick with stenting of his AV groove      circumflex.  3.  Cardiac catheterization in 1998 with a stent of the second obtuse      marginal and a decreased ejection fraction secondary to ETOH abuse in      the past with an ejection fraction of 25% in 2002.  4.  Congestive heart failure.  5.  Left ventricular dysfunction.  6.  Dilated cardiomyopathy.  7.  Diabetes type 2.  8.  Chronic obstructive pulmonary disease with ongoing tobacco abuse.  9.  Hypertension.  10. History of ETOH abuse.  11. Hyperlipidemia.  12. Medical noncompliance.  13. Obesity.  14. Sedentary lifestyle.   HOSPITAL COURSE:  Mr. Gidney is a 65 year old Caucasian gentleman  who is  admitted on 03/09/2005 with multiple issues related to his noncompliance and  socioeconomic issues.  The patient had presented with shortness of breath  and peripheral edema and the patient had ran out of his medications.  The  patient was admitted with atrial fibrillation and congestive heart failure  and a planned right and left heart catheterization was performed on March 11, 2005 which showed nonobstructive coronary artery disease which was  planned to be treated with medical treatment.  Also planned to do a TEE with  PCL on March 12, 2005.  The TEE showed mildly decreased LV function with  an ejection fraction of around 40%, moderate MR, mild to moderate TR, mild  AI and small global thrombus in the LAD, remote biatrial enlargement and a  small PFO with left to right shunt.  It was determined that the patient  would not have a cardioversion while he had a left atrial thrombus.  We will  continue to control the rate  control him and anticoagulate him.  The patient  was also given tobacco cessation advice during this hospitalization.  The  patient continued to improve with his CHF.  The patient will be cardioverted  four weeks after his INR is greater than 2.  The patient was seen on  March 15, 2005 by Dr. [name] and the patient desired to go home for  Christmas.  At that point, the patient had no complaints of chest pain or  shortness of breath.  His INR was therapeutic.  Dr. [name] stated the  patient could be discharged home today and cardioverted four weeks after his  INR is therapeutic.  The patient will need an EP evaluation for atrial  flutter ablation as an outpatient.  We will check his INR in the clinic on  Wednesday and also a BMET.  Will decrease his Lasix to 40 mg daily and his  potassium chloride to 20 mg daily.  As the BUN and creatinine are mildly  elevated, so the plan is to follow up with Dr. Eden Emms in two weeks after  discharge and we will  discharge the patient on Coumadin 5 mg alternating  with 2.5 mg daily.   OTHER DISCHARGE MEDICATIONS:  1.  Toprol XL 200 mg daily.  2.  Pioglitazone 15 mg p.o. daily.  3.  Advair one puff inhaled b.i.d.  4.  Coumadin as stated above.  5.  Lasix and potassium as stated above.  6.  Diltiazem 240 mg p.o. daily.  7.  Simvastatin 40 mg q.h.s.   The patient was also instructed to follow a low fat, low cholesterol, low  salt diet, to record his daily weights and to call if he has a three to four  pound weight gain.  He was also given instructions to stop smoking.     ______________________________  April Humphrey, NP    ______________________________  Olga Millers, M.D. [facility]    AH/MEDQ  D:  03/15/2005  T:  03/17/2005  Job:  [id number]   cc:   [name], M.D.  [address]  [address]  [place]  [place] [address]   Trish Fountain, MD  Fax: [phone]

## 2010-08-08 NOTE — H&P (Signed)
[name], [name]                ACCOUNT NO.:  [id number]   MEDICAL RECORD NO.:  [id number]          PATIENT TYPE:  INP   LOCATION:  1825                         FACILITY:  [facility]   PHYSICIAN:  Vida Roller, M.D.   DATE OF BIRTH:  05/01/1945   DATE OF ADMISSION:  03/09/2005  DATE OF DISCHARGE:                                HISTORY & PHYSICAL   PRIMARY CARDIOLOGIST:  [name], M.D.   PRIMARY CARE PHYSICIAN:  Trish Fountain, M.D.   [name] is a 65 year old Caucasian gentleman followed by Dr. Eden Emms, with  history of coronary artery disease.  Unfortunately, [name] has multiple  issues today secondary to noncompliance and socioeconomic issues.  He  presents with shortness of breath and peripheral edema.  [name] states he  frequently runs out of his medications, but states he has been compliant  taking his fluid pills and Lopressor.  Last week, he states, he felt very  tired and run down and more short of breath than normal.  He felt like he  was developing a cold.  He saw Dr. Gwen Pounds, received his fluid shot, and  continued to feel bad.  He states he took some over-the-counter medications  with some relief in the discomfort, but he noticed that he had more  shortness of breath than normal for him, had a productive cough with  increased wheezing.  Cough was yellow-clear sputum at times.  He also  noticed he had developed peripheral edema, approximately one week ago it  started.  Yesterday all symptoms were worse.  He was very restless last  night.  He states he was up most of the night pacing and only slept for  about 2 hours.  [name] also states he occasionally has chest tightness  with activity that is relieved with stress.  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  His wife states [name]  is very  sedentary when he is not at work.  His only activity is walking from his  recliner to the kitchen to eat.  He is also very noncompliant, continues to  smoke between one-half to one pack a day and has done so for 30+ years.   ALLERGIES:  1.  Include ASPIRIN which gives him hives.  2.  DOXYCYCLINE which causes hives.   MEDICATIONS:  Please note: This medication list is quite different from the  medications listed for office notes.  [name] also states that he takes  1/2 he dose sometimes just to make his medicines last longer.  Several of  the medications he is not taking at all.  Currently he is taking:  1.  Lopressor 25 mg daily.  2.  Hydrochlorothiazide 25 mg daily.  3.  Amiodarone 200 mg daily.  4.  Actos 15 mg daily.  5.  Advair p.r.n.  6.  K-Dur 20 mEq daily.  7.  He thinks he has been started on Glucophage 500 mg daily.  He is not      sure if this is the correct medicine or not; however, he does not check      his CBGs at home either.  8.  The patient stopped himself the Lasix, Lotrel, glyburide, and Coumadin.   PAST MEDICAL HISTORY:  1.  Atrial flutter status post TEE with cardioversion July 2005.  2.  Anticoagulation therapy with Coumadin secondary to atrial flutter.  3.  Myocardial infarction in 1997 with cardiac catheterization at that time.      According to our old office notes, this was done by [facility].      Apparently [name] had a PTCA done by Dr. Lavonne Chick with stenting of      his A-V groove circumflex.  I have no notes available from that      procedure.  Cardiac catheterization in 1998 with a stent to the second      obtuse marginal.  [name] has a history of moderately depressed      ejection fraction secondary to ETOH abuse in the past with EF of 25%      back in 2002.  Repeat echocardiogram in 2005 shows EF increased to 55%.  4.  CHF.  5.  LV dysfunction.  6.  Dilated cardiomyopathy.  7.  Diabetes, type 2.  8.  COPD with ongoing  tobacco abuse.  9.  Hypertension.  10. History of ETOH abuse.  11. Hyperlipidemia.  12. Medical noncompliance.  13. Sedentary lifestyle.  14. Obesity.   SOCIAL HISTORY:  He lives in [place] with his wife.  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  Tobacco: One-half pack to one pack per day  x 30+ years.  ETOH: Quit in 1998.  Diet: Low-sodium.  Partial compliance  with ADA diet.  The patient states he drinks a lot of Pepsi.  Denies any  drug or herbal medication use.   FAMILY HISTORY:  Mother deceased from cancer.  Father deceased at age 53  secondary to an MI.  He has one sister with some coronary artery disease,  details unavailable.   REVIEW OF SYSTEMS:  Positive for sweats, chest pain, shortness of breath,  dyspnea on exertion, orthopnea, PND, edema, palpitations, cough, and  wheezing.  All other systems negative per patient.   PHYSICAL EXAMINATION:  VITAL SIGNS:  Temperature 98.2, pulse 94,  respirations 24, blood pressure 125/78, O2 saturation 96% on room air.  GENERAL:  He is in no acute distress.  He is a pleasant, cooperative, obese  gentleman.  HEENT:  Pupils equal, round, and reactive to light. Sclerae clear.  He has  poor dental hygiene.  NECK:  Supple without lymphadenopathy.  Negative bruit or JVD.  CARDIOVASCULAR:  S1, S2 irregular, tachy heart rhythm.  Pulses 2+ and equal  without bruits.  LUNGS:  He has rales and rhonchi throughout.  ABDOMEN: Soft, nontender.  Positive bowel sounds.  EXTREMITIES:  He has 3+ pitting edema bilateral lower extremities with 2+  DPs.  NEUROLOGIC:  Alert and oriented x3.  Cranial nerves II-XII grossly intact.   Chest x-ray shows cardiomegaly with vascular congestion.   EKG: Atrial fibrillation at rate of 96.   Lab work shows white blood cell count 7.3, hemoglobin 14.9, hematocrit 44  with platelet count of 247,000.  Sodium 139, potassium 3.5, BUN 21, creatinine 1.5, with a glucose of 70. Troponin 0.26  point-of-care  marker. PT  15.7 with INR of 1.2.   ASSESSMENT:  This is a patient with new onset of atrial fibrillation,  congestive heart failure with volume overload.  The patient will be  admitted, cycle enzymes, diurese, afterload reduction.  Check a 2-D  echocardiogram.  Plan for cardiac catheterization possibly on Wednesday.  Anticoagulate with heparin.  Continue to hold Coumadin.      Dorian Pod, NP      Vida Roller, M.D.  Electronically Signed    MB/MEDQ  D:  03/09/2005  T:  03/10/2005  Job:  [id number]   cc:   [name], M.D.  [address]  [address]  [place]  [place] [address]   Trish Fountain, MD  Fax: [phone]

## 2010-08-08 NOTE — Cardiovascular Report (Signed)
[place]  Patient:    [name], [name]                         MRN: [id number] Proc. Date: 08/26/00 Attending:  Darlin Priestly, M.D. CC:         Gordy Savers, M.D.                        Cardiac Catheterization  PROCEDURES: 1. Left heart catheterization. 2. Coronary angiography. 3. Left ventriculogram.  COMPLICATIONS:  None.  INDICATIONS:  The patient is a 65 year old white male, patient of Dr. Lavonne Chick, with a history of PTCA and stenting of his AV groove circumflex and in January 1998, history of moderately depressed EF secondary to ETOH use, recently referred back to our office after the patient developed lateral ST segment depression through Dr. Amador Cunas office.  He is now referred back for repeat catheterization.  DESCRIPTION OF PROCEDURE:  After given informed written consent, the patient was brought to the cardiac catheterization lab where his right and left groins were shaved, prepped, and draped in the usual sterile fashion.  ECG monitoring was established.  Using modified Seldinger technique, a #6 French arterial sheath was inserted in the right femoral artery.  A 6 French diagnostic catheter was then used to perform diagnostic angiography.  This reveals a large left main with no significant disease.  The LAD is a large vessel which coursed to the apex and gave rise to one diagonal branch.  The LAD is noted to have a 40% proximal and 30% mid vessel stenotic lesion.  The first diagonal is a large vessel which bifurcates in its mid segment and has no significant disease.  The left circumflex is a medium sized vessel which coursed in the AV groove and gave rise to two obtuse marginal branches.  The AV groove circumflex is noted to have a 90% mid vessel stenotic lesion just beyond where the obtuse marginal takes off.  There is a stent noted to extend from the AV groove circumflex into the second large obtuse marginal.   The distal AV groove circumflex is a small vessel.  The first OM is a small vessel with no significant disease.  The second OM is a large vessel which is noted to have a stent in its proximal portion extending back into the AV groove.  There is a small lower branch which comes off within the stent which has a 60% ostial lesion.  However, this is a small vessel.  The stent proper has no significant disease with a large second OM.  The right coronary artery is a large vessel which is dominant and gives rise to both the PDA as well as the posterolateral branch.  The RCA is noted to have a 40% proximal and a diffuse 30% irregularities in its distal portion. 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  The PDA has no significant disease.  The PLA is a medium sized vessel which bifurcates distally and it has up to 30% course irregularities.  LEFT VENTRICULOGRAM:  The left ventriculogram reveals moderately depressed EF of 25-30% with global hypokinesis.  HEMODYNAMICS:  Systemic arterial pressure 120/70, LV systemic pressure 120/12, LVEDP of 16.  Following the case, the right groin site was closed using a Perclose device without complications.  Ancef 1 g was given prophylactically.  CONCLUSIONS: 1. Significant one-vessel coronary artery disease involving a small    arteriovenous groove which originates from a previously placed stent    extending from the arteriovenous groove into the large second    obtuse marginal. 2. Moderately depressed ejection fraction with global hypokinesis. DD:  08/26/00 TD:  08/26/00 Job: [id number]

## 2010-08-08 NOTE — H&P (Signed)
NAMEVLADISLAV, [name]                          ACCOUNT NO.:  [id number]   MEDICAL RECORD NO.:  [id number]                   PATIENT TYPE:  INP   LOCATION:                                       FACILITY:  [facility]   PHYSICIAN:  Gordy Savers, M.D. [facility]      DATE OF BIRTH:  [birth year]   DATE OF ADMISSION:  10/16/2003  DATE OF DISCHARGE:                                HISTORY & PHYSICAL   CHIEF COMPLAINT:  Weakness and shortness of breath.   HISTORY OF PRESENT ILLNESS:  The patient is a 65 year old white gentleman  with multiple medical problems including coronary artery disease.  He  presented to the office on the day of admission complaining of increasing  cough, shortness of breath.  For the past two weeks he has noted progressive  lower extremity edema, and worsening PND, and orthopnea.  He has a history  of coronary artery disease status post acute congestive heart failure due to  acute coronary insufficiency in November 1997.  At that time, he underwent  PTCA of the circumflex.  He has been followed infrequently by Dr. Elsie Lincoln.  The patient has multiple medical problems including a history of COPD and  ongoing tobacco use.  The patient was evaluated in the office and noted to  be hypotensive with a blood pressure of 86/50, pulse rate ranged from 140 to  150.  The patient is now admitted for further evaluation and treatment of  decompensated congestive heart failure.   PAST MEDICAL HISTORY:  1. Coronary artery disease, as mentioned, with LV dysfunction.  He was     admitted, on February 02, 1996, with acute left ventricular dysfunction     related to two-vessel coronary artery disease.  2. He had a new diagnosis of non-insulin-dependent diabetes at that time.  3. Heart catheterization was performed, on August 26, 2000, that revealed     significant one-vessel disease involving a small A-V groove which     originated from a previously placed stent extending from the A-V groove  into a large second obtuse marginal.  Moderately depressed ejection     fraction with global hypokinesis was noted at that time.  Ejection     fraction was estimated at 25% to 30%.  4. Hypertension.  5. Hypercholesterolemia.  6. COPD and ongoing tobacco use.  Past medical history is otherwise fairly     unremarkable.  7. He has had bilateral inguinal hernia repairs.  8. Surgery for a pilonidal cyst.   MEDICAL REGIMEN:  1. Theophylline extended release 400 mg daily.  2. Imdur 30 mg daily.  3. Lotrel 5/20 daily.  4. Glucovance 2.5/500 b.i.d.  5. Hydrochlorothiazide 25 mg daily.  6. Lipitor 10 mg daily.  7. Actos 45 mg daily.  8. Multivitamins.  9. Advair 100/50 one puff b.i.d.  P.r.n. medications include:  1. Nitroglycerin.  2. Ultram.  3. Albuterol.   SOCIAL HISTORY:  He has been  married on two occasions accompanied by a  girlfriend.  He continues to smoke and there is a history of alcohol use.  He is seen very infrequently in our office.  Often goes greater than one  year without medical followup.  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   FAMILY HISTORY:  Positive for coronary artery disease and diabetes.  Father  died of an MI when the patient was 12-years-of-age.   REVIEW OF SYSTEMS:  The patient denies any chest pain.  He does have chronic  dyspnea on exertion that has intensified with worsening cough.  He was last  seen in the office on Aug 01, 2003, with a weight of 214.  Weight 224 today.  He has a history of diabetes but checks blood sugars infrequently.  Last  hemoglobin A1c was 6.8 in January.   PHYSICAL EXAMINATION:  VITAL SIGNS:  Blood pressure 86/60, pulse rate 140 to  144.  GENERAL:  Revealed a well developed male, who appeared older than his stated  age, who appeared weak and chronically ill.  SKIN:  Warm without diaphoresis.  HEENT:  Revealed normal pupillary responses.  Conjunctivae clear.  ENT  unremarkable.  NECK:  Revealed no  audible bruit.  There is no discernable neck vein  distention.  CHEST:  Revealed coarse diffuse rhonchi.  Rales were present at both bases.  CARDIOVASCULAR:  Revealed a rapid regular rhythm without murmur.  Rate was  about 140.  ABDOMEN:  Obese, soft, and nontender.  EXTREMITIES:  Revealed +3 pitting edema.   IMPRESSION:  1. Decompensated congestive heart failure.  2. Coronary artery disease status post percutaneous transluminal coronary     angioplasty of the circumflex artery in 1997.  3. History of left ventricle dysfunction.   ADDITIONAL DIAGNOSES:  1. Diabetes.  2. Hypertension.  3. Hypercholesterolemia.  4. Chronic obstructive pulmonary disease and ongoing tobacco use.   DISPOSITION:  The patient will be admitted to a telemetry setting.  An EKG  will be obtained.  Serial cardiac enzymes will be reviewed as well as a 2D  echocardiogram.  The patient will be carefully diuresed.  He will be placed  on parenteral heparin.  Serial blood sugars will be monitored.  He will be  covered with sliding scale of Humulin regular.  A cardiology consult will be  obtained.  His Metformin will be held as well as his antihypertensive  regimen.                                                Gordy Savers, M.D. [facility]    PFK/MEDQ  D:  10/16/2003  T:  10/16/2003  Job:  [id number]

## 2010-08-08 NOTE — Consult Note (Signed)
[name], [name]                ACCOUNT NO.:  [id number]   MEDICAL RECORD NO.:  [id number]          PATIENT TYPE:  INP   LOCATION:  3705                         FACILITY:  [facility]   PHYSICIAN:  Corwin Levins, M.D. LHCDATE OF BIRTH:  April 22, 1945   DATE OF CONSULTATION:  06/12/2005  DATE OF DISCHARGE:                                   CONSULTATION   CHIEF COMPLAINT:  Consultation requested by Dr. Tenny Craw regarding diabetes and  other  medical problems.   HISTORY OF PRESENT ILLNESS:  [name] is a 65 year old white male who  presented to Dr. Ninfa Linden office earlier today and referred to the ER  when he felt obviously severely ill with lightheadedness and found to be  hypotensive and bradycardic in the ER. The patient stated that prior to that  he has had cough which is unusual for him and hot and cold feelings new in  the past two weeks after the wife had dealt with a cold. He was treated  aggressively in the ER with epinephrine, IV fluids, and dopamine, now  feeling much better. Stabilized vital signs, alert. He is still complaining  of cough, but also has acute urinary retention symptoms since treatment in  the ER/ICU. He was also found to have an elevated blood sugar post treatment  in the ER of approximately 600. He does not check his blood sugars at home,  but denies any polys prior to admission. His current symptoms in the ICU at  this time is severe difficulty urinating, especially since he took Lasix  early in the day. The nurse finds it hard to pass a Foley catheter.   PAST MEDICAL HISTORY:   ILLNESSES:  1.  History of atrial flutter/fibrillation, status post TEE with      cardioversion in July 2005.  2.  History of coronary artery disease, status post MI in 1997, status post      PTCA. Also later status post stent to another coronary artery.  3.  History of alcohol abuse.  4.  History of dilated cardiomyopathy with an ejection fraction of 25% in      2002, felt  secondary to alcohol abuse.  5.  CHF secondary to above.  6.  Diabetes mellitus, type 2.  7.  COPD.  8.  Hypertension.  9.  Hypercholesterolemia.  10. Obesity.  11. Noncompliance.   SURGERY:  1.  Status post bilateral hernia surgery.  2.  Status post pilonidal cyst.   ALLERGIES:  ASPIRIN, DOXYCYCLINE/TETRACYCLINE.   CURRENT MEDICATIONS:  With question of ferocity, need to check home list.  Current medication list as best we can tell includes:  1.  Coumadin as directed.  2.  Glyburide/metformin 2.5/500 b.i.d.  3.  Lipitor 10 mg p.o. daily.  4.  K-Dur 20 mEq p.o. b.i.d.  5.  Diltiazem 240 mg p.o. daily.  6.  Actos 45 mg p.o. daily.  7.  Metoprolol 100 mg b.i.d.  8.  Digoxin 0.25 mg p.o. daily.  9.  Lasix 40 mg b.i.d.  10. Albuterol meter dose inhaler p.r.n.  11. Tramadol unknown dose t.i.d.  p.r.n.   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  Tobacco--half to one pack per day.  Alcohol--none since 1998, but  later in 2005 admitted to drinking in an admission at that time.   FAMILY HISTORY:  Mother deceased with cancer. Father deceased at age 51 with  MI.   REVIEW OF SYSTEMS:  Otherwise noncontributory.   PHYSICAL EXAMINATION:  VITAL SIGNS: [name] is a 65 year old white male,  afebrile with blood pressure currently 122/50, heart rate 72, O2 saturation  91% on five liters.  ENT: Sclerae clear. TMs clear. Oropharynx benign.  NECK: Without lymphadenopathy, JVD, or thyromegaly.  CHEST: No rales or wheezing; somewhat dyspneic.  CARDIAC: Irregularly irregular.  ABDOMEN: Soft, nontender, positive bowel sounds. There is some mild  distention with suprapubic mass consistent with enlarged bladder.  EXTREMITIES: No edema.   LABORATORY DATA:  A second ABG with pH of 7.32 improved with PCO2 of 55 down  from 68. BNP 292. CPK-MB 134 and 56. Troponin-I 0.95, of questionable  significance. Digoxin 2.4. Hemoglobin 16.7. Sodium 131, potassium  4.9,  chloride 101, bicarbonate 23, BUN 36, creatinine 2.5, glucose 577 by I-STAT.  White blood cell count 13.4, hemoglobin 14.1.   Chest x-ray shows no acute disease, no CHF or infiltrates. ECG with unclear  rhythm, possible accelerated junctional rhythm. Further evaluation per  cardiology.   ASSESSMENT/PLAN:  1.  Pulmonary/infectious disease. He has COPD exacerbation, probably viral      shared with his wife at home. Will continue the IV Zosyn for presumed      outpatient pneumonia/COPD exacerbation and doubt aspiration despite his      illness as above, though cannot completely rule out. Will add Xopenex      nebulizer treatment, p.r.n. cough medications. Consider IV steroids, but      do not think he needs some at this time.  2.  Diabetes mellitus. No evidence of ketoacidosis at this time. He has      lactic acidosis with a lactic acid of 3.4 most likely secondary to      respiratory insufficiency and hypotension. Will obviously need to      continue IV fluids and dopamine for cardiovascular service. Will hold      OHAs. I do not think he needs glucomander at this time. Will start      sliding scale insulin and CBGs.  3.  Cardiovascular, sinus arhythmia. Otherwise per cardiology. Suspect      likely exxacerbated by COPD exacerbation.  4.  Acute urinary retention secondary to acute illness above, plus/minus      epinephrine. Will start Flomax today and Foley. Will consider urology      consult.  5.  Renal insufficiency most likely secondary to hypotension. Probably not      acute urinary retention, but cannot completely rule out. Will need to      consider renal ultrasound to rule out obstruction as BUN and creatinine      not readily decreased with IV fluids, etc.  6.  Hyponatremia, mild, secondary to elevated glucose. Otherwise, follow up.  7.  Chronic Coumadin. Defer to cardiology.  8.  Other medical problems, otherwise stable.          ______________________________  Corwin Levins, M.D. [facility]     JWJ/MEDQ  D:  06/12/2005  T:  06/16/2005  Job:  [id number]   cc:   Rollene Rotunda, M.D.  [address]  [place]  [place] [address]   Gordy Savers, M.D. [facility]  [address] [place]  [place] [address]

## 2010-08-08 NOTE — H&P (Signed)
[name], [name]                          ACCOUNT NO.:  [id number]   MEDICAL RECORD NO.:  [id number]                   PATIENT TYPE:  INP   LOCATION:  3733                                 FACILITY:  [facility]   PHYSICIAN:  Gordy Savers, M.D. [facility]      DATE OF BIRTH:  19-Dec-1945   DATE OF ADMISSION:  10/16/2003  DATE OF DISCHARGE:                                HISTORY & PHYSICAL   PAST CARDIOLOGIST:  Dr. Jenne Campus   PRESENT CARDIOLOGIST:  Dr. Eden Emms   PRIMARY CAREGIVER:  Dr. Amador Cunas   PRESENTING CIRCUMSTANCE:  I got so I couldn't breathe.   HISTORY OF PRESENT ILLNESS:  [name] is a 65 year old male with a history  of coronary artery disease.  He is status post a stent to the second obtuse  marginal in 1998.  His last catheterization was done secondary to lateral ST  segment depression.  This was done in June 2002.  The study shows that the  LAD had a 40% proximal stenosis, a 30% midpoint stenosis.  The left  circumflex had a 90% midpoint stenosis out of the first obtuse marginal.  A  stent in the second obtuse marginal was patent.  The right coronary artery  had a proximal 40% disease, 30% distal disease, ejection fraction of 25%-  30%.  The patient has known cardiomyopathy both on the catheterization in  2002 and by echocardiogram July 27.  Echocardiogram was done on July 27  showing EF of 25%-35%, diffuse LV hypokinesis, moderate mitral  regurgitation.   In the last 2-3 weeks, the patient has a constellation of symptoms revolving  around a congestive failure pattern.  He has been experiencing dyspnea,  dyspnea on exertion, paroxysmal nocturnal dyspnea, orthopnea, increasing  peripheral edema, increasing cough with clear secretions.  His symptoms have  become progressive.  The patient has been sitting up to get some sleep in  the last three nights and his exercise tolerance has been greatly affected.  The patient admitted with CHF, found to have a rapid heart  rate.  Electrocardiogram looks like atrial flutter, rate of 150.  Potassium was 3.  He is on theophylline and albuterol.  Both of these have been stopped.   ALLERGIES:  1. TETRACYCLINE gives throat swelling.  2. ASPIRIN throat swelling.   MEDICATIONS:  1. Advair 100/50 one puff twice daily.  2. Theophylline 400 mg daily.  3. Imdur 30 mg daily.  4. Lotrel 5/20 daily.  5. Glucovance 2.5/500 daily.  6. Hydrochlorothiazide 25 mg daily.  7. Lipitor 10 mg daily at bedtime.  8. Actos 45 mg daily.  9. Albuterol metered dose inhaler as needed.  10.      Nitroglycerin sublingually as needed.  11.      Ultram as needed.   Now at [facility], he is on 40 mg IV Lasix q.8 h., potassium 20 mEq  twice daily.  Albuterol nebulizer has been switched to Xopenex nebulizer.  He is also on theophylline 400 mg daily, Glyburide 2.5 mg daily, Zocor 20 mg  at bedtime, and metoprolol 12.5 mg t.i.d.   PAST MEDICAL HISTORY:  1. Coronary artery disease as described above, status post a stent to the     second obtuse marginal in 1998.  2. A mixed pattern of cardiomyopathy.  Ejection fraction 25%-35%.  3. Hypertension.  4. Hypercholesterolemia.  5. Chronic obstructive pulmonary disease with ongoing tobacco habituation.  6. Bilateral herniorrhaphy.  7. Diabetes.  8. Significant ethanol use having stopped one week ago.   SOCIAL HISTORY:  The patient lives in [place] with his wife.  He has one  grown child.  He smokes tobacco currently one pack per day.  Up until one  week ago, one pint of [employer] daily.   REVIEW OF SYSTEMS:  Revolving mainly around the cardiopulmonary system.  The  patient has no chest pain.  Shortness of breath, dyspnea on exertion,  paroxysmal nocturnal dyspnea, orthopnea, lower extremity edema, cough.  The  patient is not particularly aware of palpitations although at times he does  feel a fluttery feeling.  The patient denies any prior history of pulmonary  embolism,  deep venous thrombosis, or prior history of cerebrovascular  accident.  He was not having any peptic ulcer disease or history of GI  bleeding.   PHYSICAL EXAMINATION:  VITAL SIGNS:  Temperature 97.8, pulse is 148-155,  respirations are 22, blood pressure 129/81, oxygen saturation is 92% on room  air.  GENERAL:  The patient is in no acute distress.  He is not having chest pain  currently.  Mild dyspnea.  HEENT:  Eyes:  Pupils equal, round, reactive to light.  Extraocular  movements are intact.  Sclerae are clear.  Mucous membranes:  Pink, moist  without lesions or erythema.  The patient does not wear dentures.  NECK:  Supple.  No carotid bruits auscultated.  No cervical lymphadenopathy.  HEART:  Rapid but regular rate.  No murmur.  LUNGS:  Relatively clear to auscultation and percussion bilaterally.  INTEGUMENT:  No rashes or lesions.  ABDOMEN:  Mildly obese.  No rebound or guarding.  Bowel sounds are present.  No hepatosplenomegaly.  UROGENITAL/RECTAL:  Exam deferred.  EXTREMITIES:  Evidence of 3-4+ edema.  This is pitting at the anterior  tibial areas.  He has palpable pulses in the feet and radial pulses are  palpable.  The arms indeed also look slightly edematous.  He has no joint  deformities or effusions  NEUROLOGIC:  Examination basically without focal deficit.   Chest x-ray has not been reviewed.  Electrocardiogram:  Rate of 153, rhythm  is possible flutter, right bundle branch block, PR interval is 120, QRS is  134, QTC 469.   LABORATORY STUDIES:  White cells 9.3, hemoglobin 13.4, hematocrit 40.4,  platelets 279.  Serum electrolytes:  Sodium 136, potassium 3, chloride 103,  carbonate is 28, BUN is 9, creatinine 1.1, glucose is 56.  Alkaline  phosphatase is 58, SGOT is 29, SGPT is 29.  BNP is 142.  HgbA1C is 6.6.  Theophylline level is 8.6.  PTT is 42, PT is 15.8, INR 1.4.  Cardiac enzymes are in a serial fashion:  Troponin-I 0.31, 0.35, and 0.29.   IMPRESSION:  1.  Presenting with dyspnea and found to be in atrial flutter with heart rate     of 150.  2. Start Coumadin today.  3. Start amiodarone with bolus and drip, digoxin 0.25 mg IV x3 doses.  Hold     the theophylline.  Discontinue Lopressor.  The patient will be placed on     Valium 5 mg every six hours.  He will continue his IV heparin.  He was     scheduled for TEE-guided DC cardioversion July 28.   This assessment and plan has been formulated by Dr. Eden Emms after the patient  was seen by him and after the past medical history reviewed.      [name], P.A.                    Gordy Savers, M.D. [facility]    GM/MEDQ  D:  10/17/2003  T:  10/17/2003  Job:  [id number]

## 2010-08-08 NOTE — Discharge Summary (Signed)
[name], [name]                ACCOUNT NO.:  [id number]   MEDICAL RECORD NO.:  [id number]          PATIENT TYPE:  INP   LOCATION:  3733                         FACILITY:  [facility]   PHYSICIAN:  Rene Paci, M.D. LHCDATE OF BIRTH:  August 31, 1945   DATE OF ADMISSION:  10/16/2003  DATE OF DISCHARGE:  10/22/2003                                 DISCHARGE SUMMARY   </DIAGNOSES AT DISCHARGE>  1.  Weakness.  2.  Shortness of breath.  3.  Atrial flutter.   BRIEF ADMISSION HISTORY:  [name] is a 65 year old white male who  presented to the office on the day of admission complaining of progressive  dyspnea associated with cough.  The patient had also described progressive  lower extremity edema with worsening paroxysmal nocturnal dyspnea and  orthopnea.   PAST MEDICAL HISTORY:  1.  Coronary artery disease, status post acute coronary spasm in November of      1997, status post PTCA to circumflex at that time.  2.  LV dysfunction.  3.  Adult-onset diabetes mellitus, non-insulin-requiring.  4.  EF of 25% to 30% with global hypokinesis in June of 2002.  5.  Hypertension.  6.  Hypercholesterolemia.  7.  COPD with ongoing tobacco.  8.  Bilateral inguinal hernia repairs.  9.  History of pilonidal cyst.   HOSPITAL COURSE:  PROBLEM #1 - CORONARY ARTERY DISEASE WITH DILATED  CARDIOMYOPATHY WITH AN EJECTION FRACTION OF 25% TO 35% WHO PRESENTED IN  ATRIAL FLUTTER WITH A RAPID VENTRICULAR RESPONSE:  The patient was started  on heparin and Coumadin as well as Lopressor, digoxin and amiodarone to  control the rate.  The patient was scheduled for a TEE DCCV, which was  performed on October 18, 2003.  It was recommended the patient be completely  anticoagulated.   PROBLEM #2 - PULMONARY:  The patient presented with respiratory  insufficiency which was felt to be multifactorial secondary to congestive  heart failure which he developed from his rapid atrial flutter, as well as  underlying COPD with  ongoing tobacco.  Pulmonary embolus was ruled out.  The  patient was noted to have a right upper lobe nodule, but that was  nonspecific and required only outpatient followup.  The patient did have  some bronchospasm that was thought to be secondary to his COPD which  improved with Advair.  The patient was stable for discharge.   LABORATORIES AT DISCHARGE:  Hemoglobin 13.4.  Pro time 22.2, INR 2.5.  LFTs  were normal, BUN 22, creatinine 1.5.  Hemoglobin A1c was 6.6%.  BNP was 142.  Cholesterol 115, triglycerides 53, HDL 55, LDL 49.  TSH 1.8.   MEDICATIONS AT DISCHARGE:  1.  Coumadin 5 mg daily.  2.  Amiodarone 200 mg 2 tabs for 7 days, then 1 tab daily.  3.  Lipitor 10 mg daily.  4.  DiaBeta 2.5 mg b.i.d.  5.  Lopressor 25 mg b.i.d.  6.  Advair 100/50 b.i.d.  7.  Lasix 40 mg b.i.d.  8.  K-Dur 20 mEq b.i.d.  9.  Spiriva as at home.  FOLLOWUP:  The patient was scheduled to follow up [facility] on  Thursday, October 25, 2003, at noon and with Dr. [name] on December 28, 2003 at 9:45 a.m.  He also had an appointment with the P.A. on November 05, 2003 at 2:30 p.m. and Dr. Gordy Savers in 2-3 weeks.       LC/MEDQ  D:  12/21/2003  T:  12/22/2003  Job:  [id number]   cc:   Gordy Savers, M.D. [facility]   [name], M.D.

## 2010-08-08 NOTE — Op Note (Signed)
NAMERAIN, [name]                ACCOUNT NO.:  [id number]   MEDICAL RECORD NO.:  [id number]          PATIENT TYPE:  OIB   LOCATION:  4738                         FACILITY:  [facility]   PHYSICIAN:  Doylene Canning. [name], M.D.  DATE OF BIRTH:  03-Oct-1945   DATE OF PROCEDURE:  08/05/2005  DATE OF DISCHARGE:                                 OPERATIVE REPORT   PROCEDURE PERFORMED:  Electrophysiologic study and radiofrequency catheter  ablation of atrial flutter.   INTRODUCTION:  The patient is a 65 year old male with a history of recurrent  symptomatic atrial flutter who desires not to be on chronic Coumadin therapy  and is now referred for catheter ablation.   PROCEDURE:  After informed consent was obtained, the patient was taken to  the diagnostic EP lab in a fasting state.  After the usual preparation and  draping, a 6 [place] hexapolar catheter was inserted percutaneously into the  right jugular vein and advanced to the coronary sinus.  A 7 French  quadripolar halo catheter was inserted percutaneously in the right femoral  vein and advanced to the right atrium.  A 5 French quadripolar catheter was  inserted percutaneously in the right femoral vein and advanced to the His  bundle region.  After measurement of the basic intervals, mapping was  carried out demonstrating typical counter clockwise tricuspid annular  reentrant atrial flutter.  The 7 French quadripolar ablation catheter was  then maneuvered into the atrial flutter isthmus.  Eight RF energy  applications were subsequently delivered resulting in termination of flutter  and restoration of sinus rhythm.  The patient was carried out from the  coronary sinus and demonstrated intact isthmus conduction.  A single RF  energy application was delivered then resulting in isthmus block.  Finally,  two bonus RF energy applications were delivered and the patient was observed  for approximately 40 minutes.  During this time, there was no recurrent  atrial flutter isthmus conduction.  Rapid ventricular pacing was then  carried out from the RV apex demonstrating a VA Wenckebach cycle length of  380 milliseconds.  During rapid ventricular pacing, the atrial activation  was midline and decremental.  Programmed ventricular stimulation was carried  out from the RV apex at a base drive cycle length of 478 milliseconds.  The  S1 and S2 interval was stepwise decreased down to 340 milliseconds where the  retrograde AV node ERP was observed.  During programmed ventricular  stimulation, the atrial activation was midline and decremental.  Next,  programmed atrial stimulation was carried out in the coronary sinus as well  as the high right atrium at a pace cycle length of 600 milliseconds and  stepwise decreased down to 330 milliseconds where AV Wenckebach was  observed.  During rapid atrial pacing, the PR interval was less than the RR  interval and there was no inducible SVT.  Programmed atrial stimulation was  carried out in the coronary sinus and the high right atrium at base drive  cycle length of 295 milliseconds.  The S1-S2 interval was stepwise decreased  down to 260 milliseconds where the  AV node ERP was observed.  During probing  stimulation, there were no AH jumps and no echo beats noted.  At this point,  the catheters were removed, hemostasis was assured, and the patient returned  to his room in satisfactory condition.   COMPLICATIONS:  There were no immediate procedure complications.   RESULTS:  1.  Baseline ECG:  The baseline ECG demonstrates atrial flutter with      variable AV conduction.  2.  Baseline intervals:  The atrial flutter cycle length was 232      milliseconds, the HV interval 48 milliseconds, QRS duration was 87      milliseconds.  3.  Rapid ventricular pacing:  Rapid ventricular pacing was carried out from      the RV apex demonstrating a VA Wenckebach cycle length of 380      milliseconds.  During rapid ventricular  pacing, the atrial activation      was midline and decremental.  4.  Programmed ventricular stimulation:  Programmed ventricular stimulation      was carried out from the RV apex at a base drive cycle length of 604      milliseconds.  The S1 and S2 interval was stepwise decreased down to 340      milliseconds where the retrograde AV node ERP was observed.  During      programmed ventricular stimulation, the atrial activation was midline      and decremental.  5.  Rapid atrial pacing:  Rapid atrial pacing was carried out from the high      right atrium at apace cycle length of 500 milliseconds and stepwise      decreased down to 330 milliseconds where AV Wenckebach was observed.      During rapid atrial pacing, the PR interval was less than the RR      interval and there was no inducible SVT.  6.  Programmed atrial stimulation:  Programmed atrial stimulation was      carried out in the coronary sinus in the high right atrium at a base      drive cycle length of 540 milliseconds.  The S1 and S2 interval was      stepwise decreased down to 260 milliseconds where an AV node ERP was      observed.  During programmed atrial stimulation, there were no AH jumps      and no echo beats noted.  7.  Arrhythmias observed:      1.  Atrial flutter initiation was present at the time of EP study.          Duration was sustained.  Cycle length was 232 milliseconds.  The          termination was catheter ablation.  8.  Mapping:  Mapping of atrial flutter demonstrated typical counter      clockwise tricuspid annular reentry.  9.  RF energy application:  A total of 11 RF energy applications were      delivered.  During the eighth RF energy application, atrial flutter was      terminated and sinus rhythm restored.  During the ninth RF energy      application, isthmus block was demonstrated.  Two bonus RF energy      applications were delivered.  CONCLUSION:  The study demonstrates successful  electrophysiologic study and  RF catheter ablation of typical atrial flutter with a total of 11 RF energy  applications delivered at the usual atrial flutter isthmus resulting in  the  termination of atrial flutter, restoration of sinus rhythm, creation of  bidirectional block and atrial flutter isthmus.           ______________________________  Doylene Canning. [name], M.D.     GWT/MEDQ  D:  08/05/2005  T:  08/05/2005  Job:  [id number]   cc:   Rollene Rotunda, M.D.  [address]  [address]  [place]  [place] [address]   Gordy Savers, M.D. [facility]  [address] [place]  [place] [address]

## 2010-08-08 NOTE — Cardiovascular Report (Signed)
[name], [name]                ACCOUNT NO.:  [id number]   MEDICAL RECORD NO.:  [id number]          PATIENT TYPE:  INP   LOCATION:  4705                         FACILITY:  [facility]   PHYSICIAN:  [name], M.D.     DATE OF BIRTH:  1946/02/20   DATE OF PROCEDURE:  DATE OF DISCHARGE:                              CARDIAC CATHETERIZATION   PROCEDURE:  Coronary arteriography.   INDICATION:  Congestive heart failure and known previous coronary artery  disease with stent to the obtuse marginal and atrial fib-flutter.   DESCRIPTION OF PROCEDURE:  Cine catheterization was done from the right  femoral artery and vein; 6-French arterial catheter was used; 7-French  venous sheath was used.   Mean right atrial pressure as 19.  RV pressure was 49/15.  PA pressure was  50/28.  Mean pulmonary capillary wedge pressure was 32.  LV pressure was  129/27.  Aortic pressure was 130/77.   Because of his moderate elevation in filling pressures and high wedge, as  well as a creatinine of 1.5, left ventriculogram was not performed.   We did cross the valve for pressure measurements as indicated above.   The patient was in atrial fibrillation and flutter at a rate of 80-100  during the case.   Left main coronary artery was normal.   Left anterior descending artery had 30% multiple discrete lesions in the mid  and distal portion.  There was a very small first diagonal branch.  The  second diagonal branch had 20% multiple discrete lesions.   The circumflex coronary artery was nondominant.  There was a 30% tubular  lesion proximally.  There was a large second obtuse marginal branch with a  widely patent stent.   The right coronary artery was dominant.  There was a 50% ostial lesion, 40%  mid-vessel lesion and 40% multiple discrete distal lesions.   The PDA and PLA were small and diffusely diseased.   IMPRESSION:  The patient does not have critical restenosis or coronary  artery disease.  Some of  the right coronary artery lesion in the ostium was  probably catheter tip spasm, but was not flow-limiting.  There was no  catheter damping or problems with flashback.   The patient's primary problem would appear to be recurrent fibrillation-  flutter.   The patient will be arranged to have a transesophageal echocardiographic-  guided cardioversion tomorrow per Dr. Jenene Slicker request.           ______________________________  [name], M.D.     PN/MEDQ  D:  03/11/2005  T:  03/13/2005  Job:  [id number]

## 2010-08-08 NOTE — H&P (Signed)
NAMEMINOR, IDEN                ACCOUNT NO.:  [id number]   MEDICAL RECORD NO.:  [id number]          PATIENT TYPE:  INP   LOCATION:  3312                         FACILITY:  [facility]   PHYSICIAN:  Pricilla Riffle, M.D.    DATE OF BIRTH:  April 15, 1945   DATE OF ADMISSION:  06/12/2005  DATE OF DISCHARGE:                                HISTORY & PHYSICAL   IDENTIFICATION:  Mr. Borawski is a 65 year old gentleman who we were asked to  see regarding hypotension, abnormal EKG.   The patient is a 65 year old with a history of CAD, cardiomyopathy, had an  MI with stent to the circumflex in 1997, stent to an OM in 1998.  Last  cardiac catheterization, in December 2006, showed a 30% LAD lesion, 30%  circumflex lesion, RCA was dominant with a 50% ostial lesion, PLSA and PDA  were small.  Pulmonary capillary wedge pressure at the time was 32.  No LV  gram was done.  LVF by transthoracic echo was 40 to 50%.  His  transesophageal echo was questioned thrombus for atrial fibrillation and  cardioversion was delayed.   The patient has a history of atrial flutter, atrial fibrillation, last seen  May 25, 2005, by Dr. Lewayne Bunting, was being scheduled for ablation.   The patient gives a two-week history of dizziness, weakness, sweats,  shortness of breath, some shoulder pain, back pain.  No chest pain, no  syncope, no cough, no nausea, no diarrhea, seen by Dr. __________  today.  Blood pressure and heart rate were low.  Transferred to [facility] emergency  room, given atropine, epinephrine on arrival to the ER, plus Zosyn IV x1.   ALLERGIES:  1.  DOXYCYCLINE leading to hives.  2.  ASPIRIN leading to hives.   MEDICATIONS:  Prior to admission:  1.  Glyburide/metformin 2.5/500 b.i.d.  2.  Coumadin as directed.  3.  Lipitor 10.  4.  K-Dur 20 mEq b.i.d.  5.  Diltiazem 240 every day.  6.  Pravachol, question 40 q.h.s.  7.  Actos 45 every day.  8.  Metoprolol 100 b.i.d.  9.  Digoxin 0.25 every day.  10.  Lasix 40 b.i.d.  11. Inhaler p.r.n.  12. Tramadol three times per day.   PAST MEDICAL HISTORY:  1.  Coronary artery disease.  2.  History of CHF, note echo results.  3.  History of atrial flutter/atrial fibrillation.  4.  History of diabetes, type 2.  5.  COPD.  6.  Tobacco use.  7.  Hypertension.  8.  History of ETOH abuse, quite in 1998.  9.  Dyslipidemia.  10. Noncompliance.  11. Moderate MR and TR.  12. History of ETOH use.  13. Obesity.   SOCIAL HISTORY:  The patient is married, lives in [place], has one child,  continues to smoke less than a half pack per day, quit ETOH in 1998.   FAMILY HISTORY:  Mother died of cancer.  Father died of an MI at age 53.  One sister with CAD.   REVIEW OF SYSTEMS:  Denies fevers, chills.  Notes sweats.  No headaches.  No  rash.  Denies chest pain.  Positive shortness of breath.  Positive dyspnea.  Positive shoulder pain.  No dysuria.  Denies nausea, vomiting, or diarrhea.  Otherwise all systems reviewed, negative to the above problem except as  noted.   PHYSICAL EXAMINATION:  GENERAL:  The patient is currently feeling weak but  better then when he came in.  VITAL SIGNS:  Blood pressure 110/45 on 10 mcg/kg/min of dopamine, heart rate  is 60s to 90s, temperature is not taken.  HEENT:  Normocephalic atraumatic.  PERRL.  EOMI.  NECK:  Supple.  No JVD or bruits.  LUNGS:  Crackles bilateral bases.  CARDIAC:  Regular rate and rhythm.  S1 S2.  No S3, S4, or murmur.  ABDOMEN:  Diffuse tenderness, mild.  No guarding.  Positive bowel sounds.  EXTREMITIES:  With 2+ dorsalis pedis pulses.  No edema.  NEUROLOGIC:  Alert and oriented x3.  Cranial nerves II-XII intact.  Motor  intact throughout grossly.   Chest x-ray:  Pending.  A 12-lead EKG, from December, shows atrial flutter  with variable conduction, currently junctional rhythm (accelerate) rate 83  beats per minute, right bundle branch block, ST-T wave changes, can not  exclude ischemia  though no change from December 2006.   LABORATORY:  Significant for a hemoglobin of 14.1, WBC of 13.4, with 62  neutrophils, 23 lymphocytes, 13 monocytes. BUN creatinine of 36 and 2.5  (baseline 1.5).  Potassium at 4.9.  Troponin 0.30.  MB fraction of 2.5.  INR  of 2.8.  ABG, question venous, pO2 of 75, pCO2 of 69, pH of 7.21.   IMPRESSION:  The patient is a 65 year old with coronary artery disease (mild  residual disease by catheterization in December), atrial fibrillation,  atrial flutter (being scheduled for ablation of atrial flutter), left  ventricular function of 45-50%, two-week history of weakness, dizziness,  fatigue, exam arrival hypotensive, bradycardic, given atropine, epinephrine,  on dopamine now.  Question over medication, question infection.   PLAN:  1.  Blood culture, urine culture.  2.  Zosyn x1 given.  3.  Hydrate.  4.  Repeat ABG.  5.  Hold beta-blocker, calcium blocker, Toprol, and Lasix.  6.  Check BNP, check chest x-ray, check a repeat INR, B-MET in a.m., cycle      enzymes.           ______________________________  Pricilla Riffle, M.D.     PVR/MEDQ  D:  06/12/2005  T:  06/13/2005  Job:  [id number]

## 2010-08-11 ENCOUNTER — Telehealth: Payer: Self-pay | Admitting: *Deleted

## 2010-08-11 MED ORDER — DOXYCYCLINE HYCLATE 100 MG PO TBEC: 100.0000 mg | DELAYED_RELEASE_TABLET | Freq: Two times a day (BID) | ORAL | Status: DC

## 2010-08-11 NOTE — Telephone Encounter (Signed)
Doxycycline #21 twice a day

## 2010-08-11 NOTE — Telephone Encounter (Signed)
Spoke with wife - informed of med sent to pharmacy

## 2010-08-11 NOTE — Telephone Encounter (Signed)
[facility] Triage Call Report Triage Record Num: [id number] Operator: Arline Asp Loftin Patient Name: [name] Call Date & Time: 08/09/2010 2:18:50PM Patient Phone: [phone] PCP: Gordy Savers Patient Gender: Male PCP Fax : [phone] Patient DOB: 01-Oct-1945 Practice Name: [facility] Reason for Call: Bonita Quin, Other, calling regarding Other. PCP is [name]. Callback number is [phone]. Seen "a couple of weeks ago for Bronchitis, took abx for 7 days." States "sx never got better." Is coughing up dark cloudy mucus. Afebrile. Emergent sx negative. Care advice per "Diabetes: Respiratory Problems" with appt advised within 3-4h due to "productive cough with colored sputum." Pt is requesting "enough antibiotics to get him through until Monday 05/21." Unsure if pt will comply. Protocol(s) Used: Diabetes: Respiratory Problems Recommended Outcome per Protocol: See Provider within 4 hours Reason for Outcome: Productive cough with colored sputum (other than clear or white sputum) Care Advice: ~ Call provider if symptoms worsen or new symptoms develop. Most adults need to drink 6-10 eight-ounce glasses (1.2-2.0 liters) of fluids per day unless previously told to limit fluid intake for other medical reasons. Limit fluids that contain caffeine, sugar or alcohol. Urine will be a very light yellow color when you drink enough fluids. ~ 08/09/2010 2:31:03PM Page 1 of 1 [id number]

## 2010-08-11 NOTE — Telephone Encounter (Signed)
Wife is requesting another round of abx for bronchitis.

## 2010-08-12 ENCOUNTER — Telehealth: Payer: Self-pay

## 2010-08-12 NOTE — Telephone Encounter (Signed)
Opened in error

## 2010-08-25 ENCOUNTER — Ambulatory Visit: Payer: Medicare Other | Admitting: Internal Medicine

## 2010-08-28 ENCOUNTER — Encounter: Payer: Self-pay | Admitting: *Deleted

## 2010-08-29 ENCOUNTER — Other Ambulatory Visit: Payer: Self-pay

## 2010-08-29 MED ORDER — RABEPRAZOLE SODIUM 20 MG PO TBEC: 20.0000 mg | DELAYED_RELEASE_TABLET | Freq: Every day | ORAL | Status: DC

## 2010-09-08 ENCOUNTER — Ambulatory Visit (INDEPENDENT_AMBULATORY_CARE_PROVIDER_SITE_OTHER): Payer: Medicare Other | Admitting: Internal Medicine

## 2010-09-08 ENCOUNTER — Encounter: Payer: Self-pay | Admitting: Internal Medicine

## 2010-09-08 DIAGNOSIS — E119 Type 2 diabetes mellitus without complications: Secondary | ICD-10-CM

## 2010-09-08 DIAGNOSIS — I509 Heart failure, unspecified: Secondary | ICD-10-CM

## 2010-09-08 DIAGNOSIS — I10 Essential (primary) hypertension: Secondary | ICD-10-CM

## 2010-09-08 DIAGNOSIS — N259 Disorder resulting from impaired renal tubular function, unspecified: Secondary | ICD-10-CM

## 2010-09-08 DIAGNOSIS — I2510 Atherosclerotic heart disease of native coronary artery without angina pectoris: Secondary | ICD-10-CM

## 2010-09-08 DIAGNOSIS — J449 Chronic obstructive pulmonary disease, unspecified: Secondary | ICD-10-CM

## 2010-09-08 DIAGNOSIS — I4891 Unspecified atrial fibrillation: Secondary | ICD-10-CM

## 2010-09-08 MED ORDER — AMOXICILLIN 500 MG PO CAPS: 500.0000 mg | ORAL_CAPSULE | Freq: Three times a day (TID) | ORAL | Status: AC

## 2010-09-08 MED ORDER — DOXYCYCLINE HYCLATE 100 MG PO CAPS: 100.0000 mg | ORAL_CAPSULE | Freq: Two times a day (BID) | ORAL | Status: DC

## 2010-09-08 NOTE — Patient Instructions (Signed)
Limit your sodium (Salt) intake  Return in one month for follow-up 

## 2010-09-08 NOTE — Progress Notes (Signed)
  Subjective:    Patient ID: [name], male    DOB: [birth year], 65 y.o.   MRN: [id number]  HPI  65 year old patient has a history of congestive heart failure and chronic atrial fibrillation. He presents with a several day history of worsening cough productive of yellow sputum;  denies any wheezing. He has chronic peripheral edema on chronic diuretic therapy. He has diabetes with the fluctuating blood sugars he states recently he has been as low as 84 but often is greater than 300. He has coronary artery disease and denies any chest pain. He has renal insufficiency. He complains of  worsening peripheral edema and states that at times he sleeps sitting up with his legs dependent. No recent lab followup INR etc. Review of Systems  Constitutional: Positive for fatigue. Negative for fever, chills, activity change and appetite change.  HENT: Negative for hearing loss, ear pain, congestion, rhinorrhea, sneezing, mouth sores, trouble swallowing, neck pain, neck stiffness, dental problem, voice change, sinus pressure and tinnitus.   Eyes: Negative for photophobia, pain, redness and visual disturbance.  Respiratory: Positive for cough and shortness of breath. Negative for apnea, choking, chest tightness and wheezing.   Cardiovascular: Positive for leg swelling. Negative for chest pain and palpitations.  Gastrointestinal: Negative for nausea, vomiting, abdominal pain, diarrhea, constipation, blood in stool, abdominal distention, anal bleeding and rectal pain.  Genitourinary: Negative for dysuria, urgency, frequency, hematuria, flank pain, decreased urine volume, discharge, penile swelling, scrotal swelling, difficulty urinating, genital sores and testicular pain.  Musculoskeletal: Negative for myalgias, back pain, joint swelling, arthralgias and gait problem.  Skin: Negative for color change, rash and wound.  Neurological: Negative for dizziness, tremors, seizures, syncope, facial asymmetry, speech  difficulty, weakness, light-headedness, numbness and headaches.  Hematological: Negative for adenopathy. Does not bruise/bleed easily.  Psychiatric/Behavioral: Negative for suicidal ideas, hallucinations, behavioral problems, confusion, sleep disturbance, self-injury, dysphoric mood, decreased concentration and agitation. The patient is not nervous/anxious.        Objective:   Physical Exam  Constitutional: He is oriented to person, place, and time. He appears well-developed.  HENT:  Head: Normocephalic.  Right Ear: External ear normal.  Left Ear: External ear normal.  Eyes: Conjunctivae and EOM are normal.  Neck: Normal range of motion.  Cardiovascular: Normal rate and normal heart sounds.        Irregular rhythm rate 106  Pulmonary/Chest: Effort normal and breath sounds normal. He has no wheezes.       O2 saturation 90%  Diffuse coarse rhonchi  Abdominal: Bowel sounds are normal.  Musculoskeletal: Normal range of motion. He exhibits edema. He exhibits no tenderness.       +2 pedal edema  Neurological: He is alert and oriented to person, place, and time.  Psychiatric: He has a normal mood and affect. His behavior is normal.          Assessment & Plan:   COPD with bronchitis. We'll treat with doxycycline expectorants and continued inhalational medications Diabetes unclear control. We'll check a hemoglobin A1c samples provided Chronic atrial fibrillation. Renal insufficiency. We'll check electrolytes

## 2010-09-10 ENCOUNTER — Ambulatory Visit (INDEPENDENT_AMBULATORY_CARE_PROVIDER_SITE_OTHER): Payer: Medicare Other | Admitting: *Deleted

## 2010-09-10 DIAGNOSIS — I4892 Unspecified atrial flutter: Secondary | ICD-10-CM

## 2010-09-10 DIAGNOSIS — I4891 Unspecified atrial fibrillation: Secondary | ICD-10-CM

## 2010-09-10 LAB — POCT INR: INR: 1.5

## 2010-09-15 ENCOUNTER — Other Ambulatory Visit: Payer: Self-pay

## 2010-09-15 MED ORDER — FLUTICASONE-SALMETEROL 250-50 MCG/DOSE IN AEPB: 1.0000 | INHALATION_SPRAY | Freq: Two times a day (BID) | RESPIRATORY_TRACT | Status: DC

## 2010-09-15 NOTE — Telephone Encounter (Signed)
Faxed back to [facility] pharmacy. KIK

## 2010-09-18 ENCOUNTER — Other Ambulatory Visit: Payer: Self-pay | Admitting: Internal Medicine

## 2010-09-19 NOTE — Telephone Encounter (Signed)
RF on 07/28/10 to [id number] - called and spoke with pt - informed [facility] request denied due to RF at [facility]

## 2010-09-23 ENCOUNTER — Other Ambulatory Visit: Payer: Self-pay | Admitting: Internal Medicine

## 2010-09-26 ENCOUNTER — Ambulatory Visit (INDEPENDENT_AMBULATORY_CARE_PROVIDER_SITE_OTHER): Payer: Medicare Other | Admitting: *Deleted

## 2010-09-26 DIAGNOSIS — I4892 Unspecified atrial flutter: Secondary | ICD-10-CM

## 2010-09-26 DIAGNOSIS — I4891 Unspecified atrial fibrillation: Secondary | ICD-10-CM

## 2010-09-29 ENCOUNTER — Telehealth: Payer: Self-pay | Admitting: Internal Medicine

## 2010-09-29 NOTE — Telephone Encounter (Signed)
Pt aware I will call when ready for pick up - could be Wednesday

## 2010-09-29 NOTE — Telephone Encounter (Signed)
Patient  Requests to Social Security as proof of his health for disability purposes... Include a list of patient's medication ... Letter is required asap.

## 2010-09-30 ENCOUNTER — Encounter: Payer: Self-pay | Admitting: Internal Medicine

## 2010-09-30 NOTE — Telephone Encounter (Signed)
Letter ready for pick up  Ans mach - LMTCB if questions - letter ready to pic up

## 2010-10-02 ENCOUNTER — Telehealth: Payer: Self-pay | Admitting: Internal Medicine

## 2010-10-02 ENCOUNTER — Encounter: Payer: Self-pay | Admitting: Internal Medicine

## 2010-10-02 ENCOUNTER — Other Ambulatory Visit: Payer: Self-pay

## 2010-10-02 MED ORDER — METOPROLOL SUCCINATE ER 50 MG PO TB24: 50.0000 mg | ORAL_TABLET | Freq: Every day | ORAL | Status: DC

## 2010-10-02 NOTE — Telephone Encounter (Signed)
Faxed back to [place] [facility]. KIK

## 2010-10-02 NOTE — Telephone Encounter (Signed)
Walk In pt Form" Pt Needs Letter stating all His Disabilities" sent to [facility]  10/02/10/km

## 2010-10-09 ENCOUNTER — Encounter: Payer: Self-pay | Admitting: Internal Medicine

## 2010-10-09 ENCOUNTER — Ambulatory Visit (INDEPENDENT_AMBULATORY_CARE_PROVIDER_SITE_OTHER): Payer: Medicare Other | Admitting: Internal Medicine

## 2010-10-09 DIAGNOSIS — E119 Type 2 diabetes mellitus without complications: Secondary | ICD-10-CM

## 2010-10-09 DIAGNOSIS — I509 Heart failure, unspecified: Secondary | ICD-10-CM

## 2010-10-09 DIAGNOSIS — I4891 Unspecified atrial fibrillation: Secondary | ICD-10-CM

## 2010-10-09 DIAGNOSIS — I2510 Atherosclerotic heart disease of native coronary artery without angina pectoris: Secondary | ICD-10-CM

## 2010-10-09 DIAGNOSIS — I10 Essential (primary) hypertension: Secondary | ICD-10-CM

## 2010-10-09 LAB — BASIC METABOLIC PANEL
CO2: 32 mEq/L (ref 19–32)
Calcium: 8.5 mg/dL (ref 8.4–10.5)
GFR: 41.29 mL/min — ABNORMAL LOW (ref >60.00)
Sodium: 139 mEq/L (ref 135–145)

## 2010-10-09 LAB — HEMOGLOBIN A1C: Hgb A1c MFr Bld: 11.3 % — ABNORMAL HIGH (ref 4.6–6.5)

## 2010-10-09 MED ORDER — SIMVASTATIN 40 MG PO TABS: 40.0000 mg | ORAL_TABLET | Freq: Every day | ORAL | Status: DC

## 2010-10-09 NOTE — Patient Instructions (Signed)
Limit your sodium (Salt) intake  Take Lantus insulin 30 units at bedtime   Please check your hemoglobin A1c every 3 months  You need to lose weight.  Consider a lower calorie diet and regular exercise.  Return office is in 2 months

## 2010-10-09 NOTE — Progress Notes (Signed)
  Subjective:    Patient ID: [name], male    DOB: Jul 13, 1945, 65 y.o.   MRN: [id number]  HPI  is a 65 year old patient who is seen today in followup. He has a diabetes. Currently he is on Lantus 20 units at bedtime. Fasting blood sugars are generally in excess of 200. He is on Humalog 20 units prior to each meal with a sliding scale coverage. Blood sugars remained quite labile. He was seen here one month ago but failed to have a hemoglobin A1c her electrolytes checked. His cardiopulmonary status has been fairly stable. He is applying for disability   Review of Systems  Constitutional: Negative for fever, chills, appetite change and fatigue.  HENT: Negative for hearing loss, ear pain, congestion, sore throat, trouble swallowing, neck stiffness, dental problem, voice change and tinnitus.   Eyes: Negative for pain, discharge and visual disturbance.  Respiratory: Positive for shortness of breath. Negative for cough, chest tightness, wheezing and stridor.   Cardiovascular: Positive for leg swelling. Negative for chest pain and palpitations.  Gastrointestinal: Negative for nausea, vomiting, abdominal pain, diarrhea, constipation, blood in stool and abdominal distention.  Genitourinary: Negative for urgency, hematuria, flank pain, discharge, difficulty urinating and genital sores.  Musculoskeletal: Negative for myalgias, back pain, joint swelling, arthralgias and gait problem.  Skin: Negative for rash.  Neurological: Negative for dizziness, syncope, speech difficulty, weakness, numbness and headaches.  Hematological: Negative for adenopathy. Does not bruise/bleed easily.  Psychiatric/Behavioral: Negative for behavioral problems and dysphoric mood. The patient is not nervous/anxious.        Objective:   Physical Exam  Constitutional: He is oriented to person, place, and time. He appears well-developed.  HENT:  Head: Normocephalic.  Right Ear: External ear normal.  Left Ear: External ear  normal.  Eyes: Conjunctivae and EOM are normal.  Neck: Normal range of motion.  Cardiovascular: Normal rate and normal heart sounds.   Pulmonary/Chest: Effort normal. No respiratory distress. He has rales.       Bibasilar rales;  right greater than left leg edema  Abdominal: Bowel sounds are normal.  Musculoskeletal: Normal range of motion. He exhibits no edema and no tenderness.  Neurological: He is alert and oriented to person, place, and time.  Psychiatric: He has a normal mood and affect. His behavior is normal.          Assessment & Plan:    Congestive heart failure.  We'll check a BMet  Diabetes mellitus. Will up titrate his Lantus insulin from 20-28 units  Recheck in 2 months

## 2010-10-10 ENCOUNTER — Ambulatory Visit (INDEPENDENT_AMBULATORY_CARE_PROVIDER_SITE_OTHER): Payer: Medicare Other | Admitting: *Deleted

## 2010-10-10 ENCOUNTER — Encounter: Payer: Medicare Other | Admitting: *Deleted

## 2010-10-10 DIAGNOSIS — I4892 Unspecified atrial flutter: Secondary | ICD-10-CM

## 2010-10-10 DIAGNOSIS — I4891 Unspecified atrial fibrillation: Secondary | ICD-10-CM

## 2010-10-20 ENCOUNTER — Other Ambulatory Visit: Payer: Self-pay

## 2010-10-20 MED ORDER — EZETIMIBE-SIMVASTATIN 10-40 MG PO TABS: 1.0000 | ORAL_TABLET | Freq: Every day | ORAL | Status: DC

## 2010-10-20 NOTE — Telephone Encounter (Signed)
rx sent to pharmacy

## 2010-10-28 ENCOUNTER — Telehealth: Payer: Self-pay | Admitting: Internal Medicine

## 2010-10-28 ENCOUNTER — Other Ambulatory Visit: Payer: Self-pay | Admitting: Internal Medicine

## 2010-10-28 NOTE — Telephone Encounter (Signed)
Called pt to discuss labs.

## 2010-10-28 NOTE — Telephone Encounter (Signed)
Pt called req lab results from 2 weeks ago. Pls call.

## 2010-10-31 ENCOUNTER — Ambulatory Visit (INDEPENDENT_AMBULATORY_CARE_PROVIDER_SITE_OTHER): Payer: Self-pay | Admitting: *Deleted

## 2010-10-31 DIAGNOSIS — I4892 Unspecified atrial flutter: Secondary | ICD-10-CM

## 2010-10-31 DIAGNOSIS — I4891 Unspecified atrial fibrillation: Secondary | ICD-10-CM

## 2010-11-07 ENCOUNTER — Encounter: Payer: Self-pay | Admitting: Internal Medicine

## 2010-11-07 ENCOUNTER — Ambulatory Visit (INDEPENDENT_AMBULATORY_CARE_PROVIDER_SITE_OTHER): Payer: Self-pay | Admitting: Internal Medicine

## 2010-11-07 DIAGNOSIS — I4891 Unspecified atrial fibrillation: Secondary | ICD-10-CM

## 2010-11-07 DIAGNOSIS — I509 Heart failure, unspecified: Secondary | ICD-10-CM

## 2010-11-07 DIAGNOSIS — I2510 Atherosclerotic heart disease of native coronary artery without angina pectoris: Secondary | ICD-10-CM

## 2010-11-07 MED ORDER — AMIODARONE HCL 200 MG PO TABS: 200.0000 mg | ORAL_TABLET | Freq: Two times a day (BID) | ORAL | Status: DC

## 2010-11-07 NOTE — Assessment & Plan Note (Signed)
He denies anginal symptoms. Continue current medical therapy. A low-fat diet is recommended.

## 2010-11-07 NOTE — Patient Instructions (Signed)
Your physician wants you to follow-up in: 8 weeks with Dr Court Joy will receive a reminder letter in the mail two months in advance. If you don't receive a letter, please call our office to schedule the follow-up appointment.  Your physician has recommended you make the following change in your medication: increase your Amiodarone to 200mg  twice daily  Call me back after talking to your wife and we can schedule a Cadioversion in 4 weeks

## 2010-11-07 NOTE — Assessment & Plan Note (Signed)
His chronic systolic heart failure is class 2-3. He'll continue his current medications. We'll plan cardioversion to restore sinus rhythm. He'll maintain a low-sodium diet.

## 2010-11-07 NOTE — Assessment & Plan Note (Signed)
His rate is reasonably well controlled. I have asked him to increase his dose of amiodarone to 400 mg daily. I plan to proceed with DC cardioversion in the next month. His INRs have been therapeutic.

## 2010-11-07 NOTE — Progress Notes (Signed)
HPI [name] returns today for followup. He is a pleasant middle-aged man with an ischemic cardiomyopathy, persistent atrial fibrillation, chronic systolic heart failure, and COPD. He has been in and out of atrial fibrillation for several years. He has been treated with amiodarone therapy. When his amiodarone dose is lowered he typically will go out of rhythm. He has chronic peripheral edema and has had tendencies towards volume overload. He has required fairly high dose diuretic. He has not had syncope. He denies palpitations. Allergies  Allergen Reactions  . Aspirin     REACTION: Swelling, trouble breathing  . Tetracycline Hcl     REACTION: Swelling, trouble breathing     Current Outpatient Prescriptions  Medication Sig Dispense Refill  . albuterol (PROAIR HFA) 108 (90 BASE) MCG/ACT inhaler Inhale 2 puffs into the lungs every 6 (six) hours as needed.        Marland Kitchen amiodarone (PACERONE) 200 MG tablet TAKE 1 TABLET BY MOUTH ONCE DAILY  30 tablet  0  . ezetimibe-simvastatin (VYTORIN) 10-40 MG per tablet Take 1 tablet by mouth at bedtime.  90 tablet  1  . Fluticasone-Salmeterol (ADVAIR DISKUS) 250-50 MCG/DOSE AEPB Inhale 1 puff into the lungs every 12 (twelve) hours.  60 each  4  . furosemide (LASIX) 80 MG tablet Take 1 tablet (80 mg total) by mouth 2 (two) times daily.  180 tablet  2  . insulin glargine (LANTUS) 100 UNIT/ML injection 30 units at bedtime  10 mL  3  . insulin lispro (HUMALOG) 100 UNIT/ML injection 20 units prior to each meal; if blood sugar is in excess of 200 add an additional 8 units  10 mL  3  . Insulin Pen Needle (NOVOFINE) 32G X 6 MM MISC by Does not apply route 3 (three) times daily.        Marland Kitchen lisinopril (PRINIVIL,ZESTRIL) 10 MG tablet Take 10 mg by mouth daily.        . metolazone (ZAROXOLYN) 2.5 MG tablet Take 1 tablet (2.5 mg total) by mouth as directed. Monday , Wednesday and Friday only  100 tablet  2  . metoprolol (TOPROL-XL) 50 MG 24 hr tablet Take 1 tablet (50 mg total)  by mouth daily.  100 tablet  1  . Multiple Vitamin (MULTIVITAMIN) tablet Take 1 tablet by mouth daily.        . nortriptyline (PAMELOR) 25 MG capsule Take 25 mg by mouth at bedtime.        . potassium chloride SA (KLOR-CON M20) 20 MEQ tablet Take 1 tablet (20 mEq total) by mouth 2 (two) times daily.  180 tablet  3  . temazepam (RESTORIL) 30 MG capsule Take 1 capsule (30 mg total) by mouth at bedtime.  30 capsule  3  . tiotropium (SPIRIVA HANDIHALER) 18 MCG inhalation capsule Place 1 capsule (18 mcg total) into inhaler and inhale daily.  90 capsule  1  . traMADol (ULTRAM) 50 MG tablet TAKE ONE TABLET BY MOUTH EVERY 6 HOURS AS NEEDED  90 tablet  1  . warfarin (COUMADIN) 5 MG tablet Take by mouth as directed.           Past Medical History  Diagnosis Date  . ALCOHOL ABUSE, HX OF 04/30/2008  . Atrial fibrillation 12/17/2006  . Atrial flutter 09/20/2006  . 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 CONGESTIVE HEART FAILURE 09/20/2006  . COPD 12/17/2006  . CORONARY ARTERY DISEASE 09/20/2006  . 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  DYSPEPSIA&OTHER [facility] DISORDERS FUNCTION STOMACH 04/04/2009  . HYPERTENSION 12/17/2006  . OBESITY 04/30/2008  . Other and unspecified hyperlipidemia 09/20/2006  . OTITIS EXTERNA, CHRONIC NEC 12/28/2006  . PERSONAL HX COLONIC POLYPS 05/02/2009  . PSORIASIS 04/30/2008  . RENAL INSUFFICIENCY 04/30/2008  . History of colonoscopy   . Right shoulder pain   . Left foot pain   . Hyperlipidemia     ROS:   All systems reviewed and negative except as noted in the HPI.   Past Surgical History  Procedure Date  . Cardiac defibrillator placement   . Angioplasty   . Hernia repair     bilat     Family History  Problem Relation Age of Onset  . Lung cancer Mother     and aunt  . Heart attack Father   . Diabetes Sister     and aunt  . Colon cancer Neg Hx      History   Social History  . Marital Status: Married    Spouse  Name: N/A    Number of Children: 0  . Years of Education: N/A   Occupational History  . [employer]    Social History Main Topics  . Smoking status: Former Smoker    Quit date: 03/23/2005  . Smokeless tobacco: Not on file  . Alcohol Use: No  . Drug Use: No  . Sexually Active: Not on file   Other Topics Concern  . Not on file   Social History Narrative  . No narrative on file     BP 126/78  Pulse 88  Ht 5\' 6"  (1.676 m)  Wt 200 lb 6.4 oz (90.901 kg)  BMI 32.35 kg/m2  Physical Exam:  Diskempt, chronically ill appearing NAD HEENT: Unremarkable Neck:  No JVD, no thyromegally Lymphatics:  No adenopathy Back:  No CVA tenderness Lungs:  Clear except for rales in the bases. HEART:  Iregular rate rhythm, no murmurs, no rubs, no clicks Abd:  soft, positive bowel sounds, no organomegally, no rebound, no guarding Ext:  2 plus pulses, 2+ edema, no cyanosis, no clubbing Skin:  No rashes no nodules Neuro:  CN II through XII intact, motor grossly intact  EKG Atrial fibrillation with a controlled ventricular response. Right bundle branch block.  Assess/Plan:

## 2010-11-14 ENCOUNTER — Telehealth: Payer: Self-pay | Admitting: Internal Medicine

## 2010-11-14 NOTE — Telephone Encounter (Signed)
Pt's wife calling re pt's upcoming surgery

## 2010-11-14 NOTE — Telephone Encounter (Signed)
Patient called can not afford to pick up his Amiodarone  Dr [name] has asked him to double the dose and he can not do this at this time He is going to call us back when he can pick up the medication and double the strength so we can proceed with his DCCV  I stressed the importance of the medication and her understands but dose not have the money to get at this time

## 2010-11-22 ENCOUNTER — Other Ambulatory Visit: Payer: Self-pay | Admitting: Internal Medicine

## 2010-11-28 ENCOUNTER — Ambulatory Visit (INDEPENDENT_AMBULATORY_CARE_PROVIDER_SITE_OTHER): Payer: Self-pay | Admitting: *Deleted

## 2010-11-28 DIAGNOSIS — I4892 Unspecified atrial flutter: Secondary | ICD-10-CM

## 2010-11-28 DIAGNOSIS — I4891 Unspecified atrial fibrillation: Secondary | ICD-10-CM

## 2010-11-28 LAB — POCT INR: INR: 2.7

## 2010-11-28 MED ORDER — AMIODARONE HCL 200 MG PO TABS: 200.0000 mg | ORAL_TABLET | Freq: Two times a day (BID) | ORAL | Status: DC

## 2010-12-02 NOTE — Progress Notes (Signed)
Addended by: Judithe Modest D on: 12/02/2010 03:28 PM   Modules accepted: Orders

## 2010-12-06 ENCOUNTER — Other Ambulatory Visit: Payer: Self-pay | Admitting: Internal Medicine

## 2010-12-09 ENCOUNTER — Telehealth: Payer: Self-pay | Admitting: *Deleted

## 2010-12-09 ENCOUNTER — Inpatient Hospital Stay (HOSPITAL_COMMUNITY)
Admission: EM | Admit: 2010-12-09 | Discharge: 2010-12-13 | DRG: 190 | Disposition: A | Discharge status: Home / Self Care | Payer: Self-pay | Attending: Internal Medicine | Admitting: Internal Medicine

## 2010-12-09 ENCOUNTER — Emergency Department (HOSPITAL_COMMUNITY): Payer: Self-pay

## 2010-12-09 ENCOUNTER — Telehealth: Payer: Self-pay | Admitting: Internal Medicine

## 2010-12-09 DIAGNOSIS — N183 Chronic kidney disease, stage 3 unspecified: Secondary | ICD-10-CM | POA: Diagnosis present

## 2010-12-09 DIAGNOSIS — I129 Hypertensive chronic kidney disease with stage 1 through stage 4 chronic kidney disease, or unspecified chronic kidney disease: Secondary | ICD-10-CM | POA: Diagnosis present

## 2010-12-09 DIAGNOSIS — I2510 Atherosclerotic heart disease of native coronary artery without angina pectoris: Secondary | ICD-10-CM | POA: Diagnosis present

## 2010-12-09 DIAGNOSIS — I4891 Unspecified atrial fibrillation: Secondary | ICD-10-CM | POA: Diagnosis present

## 2010-12-09 DIAGNOSIS — E349 Endocrine disorder, unspecified: Secondary | ICD-10-CM | POA: Diagnosis present

## 2010-12-09 DIAGNOSIS — I509 Heart failure, unspecified: Secondary | ICD-10-CM | POA: Diagnosis present

## 2010-12-09 DIAGNOSIS — N179 Acute kidney failure, unspecified: Secondary | ICD-10-CM | POA: Diagnosis present

## 2010-12-09 DIAGNOSIS — E785 Hyperlipidemia, unspecified: Secondary | ICD-10-CM | POA: Diagnosis present

## 2010-12-09 DIAGNOSIS — IMO0001 Reserved for inherently not codable concepts without codable children: Secondary | ICD-10-CM | POA: Diagnosis present

## 2010-12-09 DIAGNOSIS — E875 Hyperkalemia: Secondary | ICD-10-CM | POA: Diagnosis present

## 2010-12-09 DIAGNOSIS — J441 Chronic obstructive pulmonary disease with (acute) exacerbation: Principal | ICD-10-CM | POA: Diagnosis present

## 2010-12-09 DIAGNOSIS — E871 Hypo-osmolality and hyponatremia: Secondary | ICD-10-CM | POA: Diagnosis present

## 2010-12-09 DIAGNOSIS — J9600 Acute respiratory failure, unspecified whether with hypoxia or hypercapnia: Secondary | ICD-10-CM | POA: Diagnosis present

## 2010-12-09 DIAGNOSIS — I5023 Acute on chronic systolic (congestive) heart failure: Secondary | ICD-10-CM | POA: Diagnosis present

## 2010-12-09 DIAGNOSIS — F3289 Other specified depressive episodes: Secondary | ICD-10-CM | POA: Diagnosis present

## 2010-12-09 DIAGNOSIS — F329 Major depressive disorder, single episode, unspecified: Secondary | ICD-10-CM | POA: Diagnosis present

## 2010-12-09 LAB — CBC
HCT: 43.7 % (ref 39.0–52.0)
Hemoglobin: 13.7 g/dL (ref 13.0–17.0)
MCV: 87.1 fL (ref 78.0–100.0)
RBC: 5.02 MIL/uL (ref 4.22–5.81)
WBC: 11.1 10*3/uL — ABNORMAL HIGH (ref 4.0–10.5)

## 2010-12-09 LAB — DIFFERENTIAL
Eosinophils Relative: 1 % (ref 0–5)
Lymphocytes Relative: 6 % — ABNORMAL LOW (ref 12–46)
Lymphs Abs: 0.7 10*3/uL (ref 0.7–4.0)
Neutro Abs: 9.6 10*3/uL — ABNORMAL HIGH (ref 1.7–7.7)
Neutrophils Relative %: 86 % — ABNORMAL HIGH (ref 43–77)

## 2010-12-09 LAB — TROPONIN I: Troponin I: <0.3 ng/mL (ref <0.30)

## 2010-12-09 LAB — BASIC METABOLIC PANEL
BUN: 31 mg/dL — ABNORMAL HIGH (ref 6–23)
CO2: 29 mEq/L (ref 19–32)
Chloride: 93 mEq/L — ABNORMAL LOW (ref 96–112)
Creatinine, Ser: 2.01 mg/dL — ABNORMAL HIGH (ref 0.50–1.35)

## 2010-12-09 LAB — PROTIME-INR: INR: 2.32 — ABNORMAL HIGH (ref 0.00–1.49)

## 2010-12-09 LAB — CK TOTAL AND CKMB (NOT AT ARMC): Relative Index: 4.4 — ABNORMAL HIGH (ref 0.0–2.5)

## 2010-12-09 LAB — PRO B NATRIURETIC PEPTIDE: Pro B Natriuretic peptide (BNP): 2181 pg/mL — ABNORMAL HIGH (ref 0–125)

## 2010-12-09 LAB — GLUCOSE, CAPILLARY: Glucose-Capillary: 362 mg/dL — ABNORMAL HIGH (ref 70–99)

## 2010-12-09 MED ORDER — TECHNETIUM TO 99M ALBUMIN AGGREGATED
6.0000 | Freq: Once | INTRAVENOUS | Status: AC | PRN
  Administered 2010-12-09: 6 via INTRAVENOUS

## 2010-12-09 MED ORDER — XENON XE 133 GAS
10.0000 | GAS_FOR_INHALATION | Freq: Once | RESPIRATORY_TRACT | Status: AC | PRN
  Administered 2010-12-09: 10 via RESPIRATORY_TRACT

## 2010-12-09 NOTE — Telephone Encounter (Signed)
Agree- eval and treat ED

## 2010-12-09 NOTE — Telephone Encounter (Signed)
Pt's wife called stating her husband cannot breathe.  Advised to call 911 now!

## 2010-12-09 NOTE — Telephone Encounter (Signed)
Computer systems down at time of call. Wife called, stating husband could not breathe. Requested to speak with Selena Batten or Dr. Amador Cunas. Advised by triage coordinator to call 911 immediately.  Per Dr. Amador Cunas: MD agreed with triage assessment. Pt is to go straight to [facility] for evaluation and treatment.

## 2010-12-10 DIAGNOSIS — I319 Disease of pericardium, unspecified: Secondary | ICD-10-CM

## 2010-12-10 LAB — GLUCOSE, CAPILLARY
Glucose-Capillary: 470 mg/dL — ABNORMAL HIGH (ref 70–99)
Glucose-Capillary: 448 mg/dL — ABNORMAL HIGH (ref 70–99)
Glucose-Capillary: 310 mg/dL — ABNORMAL HIGH (ref 70–99)

## 2010-12-10 LAB — COMPREHENSIVE METABOLIC PANEL
ALT: 15 U/L (ref 0–53)
AST: 15 U/L (ref 0–37)
Albumin: 3.2 g/dL — ABNORMAL LOW (ref 3.5–5.2)
Alkaline Phosphatase: 82 U/L (ref 39–117)
Calcium: 9.6 mg/dL (ref 8.4–10.5)
GFR calc Af Amer: 42 mL/min — ABNORMAL LOW (ref >60)
Glucose, Bld: 248 mg/dL — ABNORMAL HIGH (ref 70–99)
Potassium: 4.5 mEq/L (ref 3.5–5.1)
Sodium: 134 mEq/L — ABNORMAL LOW (ref 135–145)
Total Protein: 8.3 g/dL (ref 6.0–8.3)

## 2010-12-10 LAB — DIFFERENTIAL
Basophils Absolute: 0 10*3/uL (ref 0.0–0.1)
Lymphocytes Relative: 3 % — ABNORMAL LOW (ref 12–46)
Monocytes Absolute: 0.2 10*3/uL (ref 0.1–1.0)
Monocytes Relative: 2 % — ABNORMAL LOW (ref 3–12)
Neutro Abs: 10.2 10*3/uL — ABNORMAL HIGH (ref 1.7–7.7)
Neutrophils Relative %: 95 % — ABNORMAL HIGH (ref 43–77)

## 2010-12-10 LAB — CARDIAC PANEL(CRET KIN+CKTOT+MB+TROPI)
CK, MB: 4 ng/mL (ref 0.3–4.0)
CK, MB: 4.7 ng/mL — ABNORMAL HIGH (ref 0.3–4.0)
Relative Index: INVALID (ref 0.0–2.5)
Relative Index: INVALID (ref 0.0–2.5)
Total CK: 91 U/L (ref 7–232)
Total CK: 96 U/L (ref 7–232)
Troponin I: <0.3 ng/mL (ref <0.30)

## 2010-12-10 LAB — GLUCOSE, RANDOM: Glucose, Bld: 464 mg/dL — ABNORMAL HIGH (ref 70–99)

## 2010-12-10 LAB — CBC
HCT: 43.5 % (ref 39.0–52.0)
Hemoglobin: 13.5 g/dL (ref 13.0–17.0)
MCHC: 31 g/dL (ref 30.0–36.0)
RBC: 4.97 MIL/uL (ref 4.22–5.81)

## 2010-12-10 LAB — TSH: TSH: 5.005 u[IU]/mL — ABNORMAL HIGH (ref 0.350–4.500)

## 2010-12-10 LAB — HEMOGLOBIN A1C: Hgb A1c MFr Bld: 10.8 % — ABNORMAL HIGH (ref <5.7)

## 2010-12-11 ENCOUNTER — Ambulatory Visit: Payer: Medicare Other | Admitting: Internal Medicine

## 2010-12-11 LAB — CBC
MCV: 85.3 fL (ref 78.0–100.0)
Platelets: 195 10*3/uL (ref 150–400)
RDW: 17.4 % — ABNORMAL HIGH (ref 11.5–15.5)
WBC: 13.8 10*3/uL — ABNORMAL HIGH (ref 4.0–10.5)

## 2010-12-11 LAB — BASIC METABOLIC PANEL
Calcium: 9.5 mg/dL (ref 8.4–10.5)
GFR calc non Af Amer: 30 mL/min — ABNORMAL LOW (ref >60)
Glucose, Bld: 250 mg/dL — ABNORMAL HIGH (ref 70–99)
Sodium: 130 mEq/L — ABNORMAL LOW (ref 135–145)

## 2010-12-11 LAB — GLUCOSE, CAPILLARY: Glucose-Capillary: 304 mg/dL — ABNORMAL HIGH (ref 70–99)

## 2010-12-11 LAB — T4, FREE: Free T4: 1 ng/dL (ref 0.80–1.80)

## 2010-12-11 LAB — PROTIME-INR: Prothrombin Time: 27.5 seconds — ABNORMAL HIGH (ref 11.6–15.2)

## 2010-12-12 ENCOUNTER — Encounter: Payer: Self-pay | Admitting: *Deleted

## 2010-12-12 LAB — GLUCOSE, CAPILLARY
Glucose-Capillary: 325 mg/dL — ABNORMAL HIGH (ref 70–99)
Glucose-Capillary: 414 mg/dL — ABNORMAL HIGH (ref 70–99)
Glucose-Capillary: 240 mg/dL — ABNORMAL HIGH (ref 70–99)

## 2010-12-12 LAB — BASIC METABOLIC PANEL
BUN: 71 mg/dL — ABNORMAL HIGH (ref 6–23)
Creatinine, Ser: 2.32 mg/dL — ABNORMAL HIGH (ref 0.50–1.35)
GFR calc Af Amer: 35 mL/min — ABNORMAL LOW (ref >60)
GFR calc Af Amer: 34 mL/min — ABNORMAL LOW (ref >60)
GFR calc non Af Amer: 29 mL/min — ABNORMAL LOW (ref >60)
GFR calc non Af Amer: 28 mL/min — ABNORMAL LOW (ref >60)
Potassium: 5.1 mEq/L (ref 3.5–5.1)
Sodium: 129 mEq/L — ABNORMAL LOW (ref 135–145)

## 2010-12-12 LAB — CBC
Hemoglobin: 13.9 g/dL (ref 13.0–17.0)
RBC: 5.05 MIL/uL (ref 4.22–5.81)

## 2010-12-12 LAB — PROTIME-INR: Prothrombin Time: 27.8 seconds — ABNORMAL HIGH (ref 11.6–15.2)

## 2010-12-13 LAB — GLUCOSE, CAPILLARY
Glucose-Capillary: 154 mg/dL — ABNORMAL HIGH (ref 70–99)
Glucose-Capillary: 162 mg/dL — ABNORMAL HIGH (ref 70–99)
Glucose-Capillary: 172 mg/dL — ABNORMAL HIGH (ref 70–99)

## 2010-12-13 LAB — CBC
MCV: 85.7 fL (ref 78.0–100.0)
Platelets: 212 10*3/uL (ref 150–400)
RDW: 17.5 % — ABNORMAL HIGH (ref 11.5–15.5)
WBC: 13 10*3/uL — ABNORMAL HIGH (ref 4.0–10.5)

## 2010-12-13 LAB — BASIC METABOLIC PANEL
Calcium: 9.1 mg/dL (ref 8.4–10.5)
Creatinine, Ser: 2.22 mg/dL — ABNORMAL HIGH (ref 0.50–1.35)
GFR calc non Af Amer: 30 mL/min — ABNORMAL LOW (ref >60)
Glucose, Bld: 138 mg/dL — ABNORMAL HIGH (ref 70–99)
Sodium: 133 mEq/L — ABNORMAL LOW (ref 135–145)

## 2010-12-16 ENCOUNTER — Telehealth: Payer: Self-pay | Admitting: Internal Medicine

## 2010-12-16 NOTE — Telephone Encounter (Signed)
Pt to have an ablation on 10-5.  Medication has been changed while in the hosp.  D/charged 9-24.  Pt has appt with Dr. [name] 10-11, but wants to be seen sooner.

## 2010-12-16 NOTE — Telephone Encounter (Signed)
lmom to come tomorrow at 3:45pm

## 2010-12-16 NOTE — Telephone Encounter (Signed)
Schedule him tomorrow at 3:45pm. Tereso Newcomer, PA-C

## 2010-12-16 NOTE — Telephone Encounter (Signed)
Patient needs a EPH appointment to set up DCCV  Will try and get him set up with Lilian Coma  Called patient and let him know he had an appointment on 12/25/10 at 11:30  Will let him know if we can move appointment up any sooner after talking with Lorin Picket

## 2010-12-17 ENCOUNTER — Ambulatory Visit (INDEPENDENT_AMBULATORY_CARE_PROVIDER_SITE_OTHER): Payer: Self-pay | Admitting: *Deleted

## 2010-12-17 ENCOUNTER — Ambulatory Visit (INDEPENDENT_AMBULATORY_CARE_PROVIDER_SITE_OTHER): Payer: Self-pay | Admitting: Physician Assistant

## 2010-12-17 ENCOUNTER — Encounter: Payer: Self-pay | Admitting: Physician Assistant

## 2010-12-17 ENCOUNTER — Encounter: Payer: Self-pay | Admitting: Internal Medicine

## 2010-12-17 ENCOUNTER — Ambulatory Visit (INDEPENDENT_AMBULATORY_CARE_PROVIDER_SITE_OTHER): Payer: Self-pay | Admitting: Internal Medicine

## 2010-12-17 ENCOUNTER — Encounter: Payer: Self-pay | Admitting: *Deleted

## 2010-12-17 VITALS — BP 120/76 | HR 108 | Ht 65.0 in | Wt 191.0 lb

## 2010-12-17 DIAGNOSIS — I4891 Unspecified atrial fibrillation: Secondary | ICD-10-CM

## 2010-12-17 DIAGNOSIS — D68318 Other hemorrhagic disorder due to intrinsic circulating anticoagulants, antibodies, or inhibitors: Secondary | ICD-10-CM

## 2010-12-17 DIAGNOSIS — I10 Essential (primary) hypertension: Secondary | ICD-10-CM

## 2010-12-17 DIAGNOSIS — N259 Disorder resulting from impaired renal tubular function, unspecified: Secondary | ICD-10-CM

## 2010-12-17 DIAGNOSIS — I4892 Unspecified atrial flutter: Secondary | ICD-10-CM

## 2010-12-17 DIAGNOSIS — E119 Type 2 diabetes mellitus without complications: Secondary | ICD-10-CM

## 2010-12-17 DIAGNOSIS — I509 Heart failure, unspecified: Secondary | ICD-10-CM

## 2010-12-17 DIAGNOSIS — I5032 Chronic diastolic (congestive) heart failure: Secondary | ICD-10-CM

## 2010-12-17 DIAGNOSIS — I2510 Atherosclerotic heart disease of native coronary artery without angina pectoris: Secondary | ICD-10-CM

## 2010-12-17 LAB — BASIC METABOLIC PANEL
Calcium: 8.7 mg/dL (ref 8.4–10.5)
Creat: 2.44 mg/dL — ABNORMAL HIGH (ref 0.50–1.35)
Sodium: 138 mEq/L (ref 135–145)

## 2010-12-17 LAB — PROTIME-INR
INR: 3.24 — ABNORMAL HIGH (ref <1.50)
Prothrombin Time: 34.1 seconds — ABNORMAL HIGH (ref 11.6–15.2)

## 2010-12-17 LAB — CBC WITH DIFFERENTIAL/PLATELET
HCT: 43.5 % (ref 39.0–52.0)
Hemoglobin: 14.2 g/dL (ref 13.0–17.0)
Lymphocytes Relative: 4 % — ABNORMAL LOW (ref 12–46)
Lymphs Abs: 0.6 10*3/uL — ABNORMAL LOW (ref 0.7–4.0)
Monocytes Relative: 3 % (ref 3–12)
Neutro Abs: 14.3 10*3/uL — ABNORMAL HIGH (ref 1.7–7.7)
Neutrophils Relative %: 94 % — ABNORMAL HIGH (ref 43–77)
RBC: 5.14 MIL/uL (ref 4.22–5.81)

## 2010-12-17 MED ORDER — SIMVASTATIN 40 MG PO TABS: 40.0000 mg | ORAL_TABLET | Freq: Every evening | ORAL | Status: DC

## 2010-12-17 MED ORDER — FUROSEMIDE 40 MG PO TABS: 40.0000 mg | ORAL_TABLET | Freq: Two times a day (BID) | ORAL | Status: DC

## 2010-12-17 MED ORDER — INSULIN GLARGINE 100 UNIT/ML ~~LOC~~ SOLN: SUBCUTANEOUS | Status: DC

## 2010-12-17 NOTE — Assessment & Plan Note (Addendum)
EF improved by recent echo.  He likely has diastolic CHF.  He is still volume overloaded.  Check a basic metabolic panel today and BNP.  Increase Lasix to 60 mg twice a day.  He needs sinus rhythm restored.  See discussion below.  He already has followup with Dr. [name] 10/11.  He should keep that appointment.

## 2010-12-17 NOTE — Assessment & Plan Note (Signed)
No angina.  Not on ASA due to coumadin Rx

## 2010-12-17 NOTE — Assessment & Plan Note (Signed)
Rate uncontrolled.  Continue current dose of amiodarone.  Increase Toprol to 50 mg twice a day.  INR today 3.5.  INR has been therapeutic dating back to August 10.  Arrange cardioversion this week.  I discussed with Dr. Clifton James and he agrees.  Toprol will be held the morning of his cardioversion and daily dose can be adjusted after restoration of NSR.  Follow up with Dr. [name] as planned.

## 2010-12-17 NOTE — Patient Instructions (Signed)
Limit your sodium (Salt) intake    It is important that you exercise regularly, at least 20 minutes 3 to 4 times per week.  If you develop chest pain or shortness of breath seek  medical attention.  You need to lose weight.  Consider a lower calorie diet and regular exercise.   Please check your hemoglobin A1c every 3 months  Return in one month for follow-up

## 2010-12-17 NOTE — Patient Instructions (Addendum)
Your physician has recommended that you have a Cardioversion (DCCV) DX 427.31 AFIB 12/19/10 @ 12:00 WITH DR. CRENSHAW. Electrical Cardioversion uses a jolt of electricity to your heart either through paddles or wired patches attached to your chest. This is a controlled, usually prescheduled, procedure. Defibrillation is done under light anesthesia in the hospital, and you usually go home the day of the procedure. This is done to get your heart back into a normal rhythm. You are not awake for the procedure. Please see the instruction sheet given to you today.  Your physician recommends that you return for lab work in: TODAY STAT PRE PROCEDURE LABS BMET, CBC W/DIFF, PT/INR, BNP  Your physician recommends that you return for lab work in: 12/24/10 REPEAT BMET  Your physician has recommended you make the following change in your medication: INCREASE LASIX TO 60 MG TWICE DAILY, INCREASE TOPROL XL TO 50 MG TWICE DAILY  Your physician recommends that you schedule a follow-up appointment in: 01/01/11 WITH DR. [name]

## 2010-12-17 NOTE — Assessment & Plan Note (Signed)
Followup basic metabolic panel today.

## 2010-12-17 NOTE — Progress Notes (Signed)
  Subjective:    Patient ID: [name], male    DOB: 06/02/1945, 65 y.o.   MRN: [id number]  HPI  65 year old patient who is seen today following hospital discharge 4 days ago. He was in there for evaluation and treatment decompensated COPD. He has a history of chronic systolic heart failure paroxysmal atrial fibrillation and chronic kidney disease. Hospital course was compounded by hyponatremia. His Lasix and dose was titrated and potassium supplementation has been discontinued he has uncontrolled diabetes medical problems include treated hypertension. Since his discharge he has done quite well he has chronic peripheral edema which has been stable on his decreased furosemide dose Zaroxolyn has also been discontinued. Denies any chest pain his pulmonary status feels to be at baseline.   Review of Systems  Constitutional: Negative for fever, chills, appetite change and fatigue.  HENT: Negative for hearing loss, ear pain, congestion, sore throat, trouble swallowing, neck stiffness, dental problem, voice change and tinnitus.   Eyes: Negative for pain, discharge and visual disturbance.  Respiratory: Positive for chest tightness and shortness of breath. Negative for cough, wheezing and stridor.   Cardiovascular: Positive for leg swelling. Negative for chest pain and palpitations.  Gastrointestinal: Negative for nausea, vomiting, abdominal pain, diarrhea, constipation, blood in stool and abdominal distention.  Genitourinary: Negative for urgency, hematuria, flank pain, discharge, difficulty urinating and genital sores.  Musculoskeletal: Negative for myalgias, back pain, joint swelling, arthralgias and gait problem.  Skin: Negative for rash.  Neurological: Negative for dizziness, syncope, speech difficulty, weakness, numbness and headaches.  Hematological: Negative for adenopathy. Does not bruise/bleed easily.  Psychiatric/Behavioral: Negative for behavioral problems and dysphoric mood. The patient is  not nervous/anxious.        Objective:   Physical Exam  Constitutional: He is oriented to person, place, and time. He appears well-developed and well-nourished. No distress.       Blood pressure 130/76  HENT:  Head: Normocephalic.  Right Ear: External ear normal.  Left Ear: External ear normal.  Eyes: Conjunctivae and EOM are normal.  Neck: Normal range of motion.  Cardiovascular: Normal rate and normal heart sounds.        Irregular rhythm with controlled ventricular response  Pulmonary/Chest: Effort normal. No respiratory distress. He has no wheezes. He has no rales.       Scattered coarse rhonchi but no rales  O2 saturation 93%  Abdominal: Bowel sounds are normal.  Musculoskeletal: Normal range of motion. He exhibits edema. He exhibits no tenderness.       +3 edema distal to his knees  Neurological: He is alert and oriented to person, place, and time.  Psychiatric: He has a normal mood and affect. His behavior is normal.          Assessment & Plan:   COPD status post possible mesh in for exacerbation. Hospital records reviewed Diabetes mellitus. Poor control considerable samples of Lantus and Humalog dispensed Hypertension stable Chronic congestive heart failure Chronic kidney disease with history of electrolyte imbalance. Will check a followup B met

## 2010-12-17 NOTE — Progress Notes (Signed)
History of Present Illness: Primary Electrophysiologist:  Dr. Lewayne Bunting   [name] is a 65 y.o. male with an ischemic cardiomyopathy, previous EF 25%, persistent atrial fibrillation, chronic systolic heart failure, and COPD.  He has a history of stent to the obtuse marginal.  Last heart catheterization 12/06: Mid to distal LAD 30%, D2 20%, proximal circumflex 30%, OM 2 stents patent, ostial RCA 50%, mid RCA 40%, distal RCA multiple 40%.  He has been in and out of atrial fibrillation for several years. He has been treated with amiodarone therapy. When his amiodarone dose is lowered he typically will go out of rhythm. He has chronic peripheral edema and has had tendencies towards volume overload. He has required fairly high dose diuretic.    He last saw Dr. [name] 8/17.  He was back in atrial fibrillation.  His rate was controlled.  His amiodarone was increased to 400 mg daily.  The plan was to proceed with DC cardioversion in one month.  Since that visit, he was admitted to [facility] 9/18-9/22.  He presented with shortness of breath and hypoxia.  His BNP was elevated and he was diuresed.  The possibility of a COPD exacerbation was also raised and he was treated with steroids and antibiotics.  Myocardial infarction was ruled out.  Echocardiogram 12/10/10: Mild-moderate LVH, EF 50-55%, mild MR, moderate LAE, moderate RAE, mild RVE with moderately reduced RV SF, PASP 50, small pericardial effusion.  He developed acute on chronic renal failure with creatinine increasing to 2.32 with diuresis.  He also developed hyperkalemia.  His diuretics were adjusted.  His metolazone was placed on hold until he presented back for followup.  Of note, a VQ scan was very low probability for pulmonary embolism.    Pertinent labs:  Hemoglobin was normal at 13.9, lowest creatinine 1.94, highest creatinine 2.32, discharge creatinine 2.23, A1c 10.8, cardiac enzymes negative x3, BNP 2181, TSH 5.005, free T4-1 0.00.   Chest x-ray was unremarkable.  Coumadin was continued.  INR remained therapeutic during his hospital stay.  He has been stable since discharge from the hospital.  Continues to note shortness of breath with exertion.  He describes class III symptoms.  He sleeps on 2-3 pillows.  He sometimes has to wake up and sleep in a recliner.  He continues to note PND.  His lower extremity edema is improved since he presented to the hospital.  He denies syncope.  He was significantly dyspneic earlier in the week and felt near syncopal with this.  His cough is improved.  He has a clear sputum.  He denies hemoptysis.  His weights at home have ranged 191-198 since discharged from the hospital.  Past Medical History  Diagnosis Date  . ALCOHOL ABUSE, HX OF 04/30/2008  . Atrial fibrillation 12/17/2006  . Atrial flutter 09/20/2006  . 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 Chronic systolic heart failure 09/20/2006    previous EF 25%;  Echocardiogram 12/10/10: Mild-moderate LVH, EF 50-55%, mild MR, moderate LAE, moderate RAE, mild RVE with moderately reduced RV SF, PASP 50, small pericardial effusion  . COPD 12/17/2006  . CORONARY ARTERY DISEASE 09/20/2006    s/p stent to OM;  catheterization 12/06: Mid to distal LAD 30%, D2 20%, proximal circumflex 30%, OM 2 stents patent, ostial RCA 50%, mid RCA 40%, distal RCA multiple 40%  . 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 DYSPEPSIA&OTHER [facility] DISORDERS FUNCTION STOMACH 04/04/2009  . HYPERTENSION 12/17/2006  . OBESITY  04/30/2008  . HLD (hyperlipidemia) 09/20/2006  . OTITIS EXTERNA, CHRONIC NEC 12/28/2006  . PERSONAL HX COLONIC POLYPS 05/02/2009  . PSORIASIS 04/30/2008  . CKD (chronic kidney disease) 04/30/2008    creatinine 2.2 range    Current Outpatient Prescriptions  Medication Sig Dispense Refill  . albuterol (PROAIR HFA) 108 (90 BASE) MCG/ACT inhaler Inhale 2 puffs into the lungs every 6 (six) hours as needed.        Marland Kitchen  amiodarone (PACERONE) 200 MG tablet Take 1 tablet (200 mg total) by mouth 2 (two) times daily.  60 tablet  11  . ezetimibe-simvastatin (VYTORIN) 10-40 MG per tablet Take 1 tablet by mouth at bedtime.        . Fluticasone-Salmeterol (ADVAIR DISKUS) 250-50 MCG/DOSE AEPB Inhale 1 puff into the lungs every 12 (twelve) hours.  60 each  4  . furosemide (LASIX) 40 MG tablet Take 1 tablet (40 mg total) by mouth 2 (two) times daily.  90 tablet  11  . insulin glargine (LANTUS) 100 UNIT/ML injection 40 units at bedtime  10 mL  3  . insulin lispro (HUMALOG) 100 UNIT/ML injection 20 units prior to each meal; if blood sugar is in excess of 200 add an additional 8 units  10 mL  3  . Insulin Pen Needle (NOVOFINE) 32G X 6 MM MISC by Does not apply route 3 (three) times daily.        . metoprolol (TOPROL-XL) 50 MG 24 hr tablet Take 1 tablet (50 mg total) by mouth daily.  100 tablet  1  . Multiple Vitamin (MULTIVITAMIN) tablet Take 1 tablet by mouth daily.        . RABEprazole (ACIPHEX) 20 MG tablet Take 20 mg by mouth daily.        . temazepam (RESTORIL) 30 MG capsule TAKE ONE CAPSULE BY MOUTH EVERY DAY AT BEDTIME  90 capsule  1  . tiotropium (SPIRIVA HANDIHALER) 18 MCG inhalation capsule Place 1 capsule (18 mcg total) into inhaler and inhale daily.  90 capsule  1  . traMADol (ULTRAM) 50 MG tablet TAKE ONE TABLET BY MOUTH EVERY 6 HOURS AS NEEDED  90 tablet  1  . warfarin (COUMADIN) 5 MG tablet Take by mouth as directed.          Allergies: Allergies  Allergen Reactions  . Aspirin     REACTION: Swelling, trouble breathing  . Tetracycline Hcl     REACTION: Swelling, trouble breathing    Social history:   Ex-smoker  ROS:  Please see the history of present illness.  All other systems reviewed and negative.   Vital Signs: BP 120/76  Pulse 108  Ht 5\' 5"  (1.651 m)  Wt 191 lb (86.637 kg)  BMI 31.78 kg/m2  PHYSICAL EXAM: Well nourished, well developed, in no acute distress HEENT: normal Neck: + JVD At  45 Cardiac:  normal S1, S2; Irregularly irregular rhythm; no murmur Lungs:  Decreased breath sounds bilaterally, no wheezing, or rales, Expiratory rhonchi Abd: soft, nontender, no hepatomegaly Ext: 2+ bilateral edema Skin: warm and dry Neuro:  CNs 2-12 intact, no focal abnormalities noted Psych: Normal affect  EKG:  Atrial fibrillation, heart rate 108, rightward axis, right bundle branch block, T-wave inversions in 2, 3, aVF, V3-V6, no significant change compared with prior tracing  ASSESSMENT AND PLAN:

## 2010-12-18 ENCOUNTER — Telehealth: Payer: Self-pay | Admitting: Internal Medicine

## 2010-12-18 LAB — BRAIN NATRIURETIC PEPTIDE: Brain Natriuretic Peptide: 116.4 pg/mL — ABNORMAL HIGH (ref 0.0–100.0)

## 2010-12-18 LAB — POCT INR: INR: 3.5

## 2010-12-18 NOTE — H&P (Signed)
[name], [name]                ACCOUNT NO.:  [id number]  MEDICAL RECORD NO.:  [id number]  LOCATION:  [facility]                         FACILITY:  [facility]  PHYSICIAN:  Mialynn Shelvin, DO         DATE OF BIRTH:  Oct 21, 1945  DATE OF ADMISSION:  12/09/2010 DATE OF DISCHARGE:                             HISTORY & PHYSICAL   PRIMARY CARE PHYSICIAN:  Gordy Savers, MD  CHIEF COMPLAINT:  Shortness of breath.  HISTORY OF PRESENT ILLNESS:  The patient is a 65 year old male who states that he has had intermittent trouble with breathing for about 2 months.  He states that his breathing has been very difficult over the past 4 days.  He presents with a cough productive of purulent sputum, increased shortness of breath.  He states he can barely walk to his car and increased swelling in his lower extremity.  PAST MEDICAL HISTORY:  Significant for obesity, medical noncompliance, hypertension, hyperlipidemia, history of alcohol abuse, paroxysmal atrial fibrillation, coronary artery disease, diabetes mellitus, COPD, CHF with an ejection fraction of 25% as of 2009, and depression.  PAST SURGICAL HISTORY:  None.  MEDICATIONS AT HOME:  A partial list without dosages or frequencies includes lisinopril, amiodarone, potassium chloride, Lasix, metoprolol, insulin, Vytorin, and the rest the patient states he cannot remember and, again, we do not have dosages or frequencies.  ALLERGIES:  TETRACYCLINE, ASPIRIN, CEPHALOSPORINS, and CEFTIN.  REVIEW OF SYSTEMS:  CONSTITUTIONAL:  Negative for fever.  Negative for chills.  Positive for weakness.  Positive for fatigue.  CNS:  No headaches, no seizures or limb weakness.  ENT:  No nasal congestion.  No coryza.  CARDIOVASCULAR:  Positive for shortness of breath with exertion.  Positive for orthopnea.  The patient is unable to sleep lying down and has been unable to for years.  He sleeps at 30% angle. RESPIRATORY:  Positive for cough productive of purulent  sputum. Positive for wheeze.  Positive for shortness of breath.  Negative for pleuritic chest pain.  GASTROINTESTINAL:  Negative for nausea.  Negative for vomiting.  Negative for constipation.  Negative for diarrhea. GENITOURINARY:  No dysuria, hematuria or urinary frequency.  RENAL:  No flank pain, no swelling or nephritis.  SKIN:  No rashes or sores or lesions.  HEMATOLOGIC:  No easy bruising.  No pulmonary clots. LYMPHATIC:  No lymphadenopathy.  No palpable lymph nodes.  No specific lymph swelling.  PSYCHIATRIC:  No anxiety.  No depression or insomnia.  SOCIAL HISTORY:  The patient quit smoking in 2010.  He quit alcohol in 1998.  He denies any recreational drug use.  FAMILY HISTORY:  Mother died of lung cancer and father died of an MI at age 7.  PHYSICAL EXAM:  VITAL SIGNS:  Temperature 97.5, pulse 108, respiratory rate 20, blood pressure 141/76, O2 sat 98% on 4 L. GENERAL:  The patient is awake, alert and ordered x3.  He is sitting up on the edge of the gurney.  He states he cannot lay back because he cannot breathe.  He is awake, alert and ordered x3 and able to give a fair history. HEENT:  Eyes:  Pupils equal, round and reactive to  light and accommodation.  External ocular movements are intact.  Sclarea nonicteric, noninjected.  Mouth:  Oral mucosa is dry.  No lesions or sores.  Pharynx is clean.  No erythema or exudate. NECK:  Negative for JVD.  Negative for thyromegaly.  Negative for lymphadenopathy. HEART:  Irregular rate and rhythm at roughly 100 beats per minute without murmur, ectopy or gallops.  No lateral PMI.  No thrills.  LUNGS: Greatly diminished breath sounds bilaterally.  I am unable to auscultate really anything.  No wheezes, no rales, no rhonchi Has increased work of breathing.  No tactile fremitus. ABDOMEN:  Soft, nontender, nondistended.  Positive bowel sounds.  No hepatosplenomegaly.  No hernias palpated. EXTREMITIES:  +3 to 4 pitting edema bilaterally.   Diminished dorsalis pedis and popliteal pulses bilaterally.  No carotid bruits bilaterally. NEUROLOGIC:  Cranial nerves II-XII gross intact.  Motor and sensory intact.  LABORATORY DATA:  WBCs 11.1, hemoglobin 13.7, hematocrit 43.7, platelets 191.  Sodium 133, potassium 4.6, chloride 93, CO2 29, BUN 31, creatinine 2.01, glucose 364.  CK is 122, MB is 5.4, troponin-I is less than 0.30. BNP is 2181.  Portable chest shows cardiac enlargement.  No acute cardiopulmonary disease.  ASSESSMENT: 1. Respiratory failure, acute on chronic.  Differential diagnosis     includes chronic obstructive pulmonary disease exacerbation,     congestive heart failure exacerbation or some combination thereof     or possibly a  pulmonary embolus although this seems less likely     given that the patient's INR is therapeutic on Coumadin. 2. Chronic obstructive pulmonary disease exacerbation with cough     productive of purulent sputum, shortness of breath. 3. Congestive heart failure exacerbation with elevated BNP and     increased lower extremity edema and worsening orthopnea. 4. Paroxysmal atrial fibrillation, not well controlled currently on     amiodarone although his anticoagulation is therapeutic. 5. Hypertension which is controlled. 6. Medical noncompliance. 7. Hyperlipidemia. 8. Diabetes mellitus.  PLAN: 1. Admit to telemetry. 2. Rule out MI by __________ criteria. 3. Nebulizers, steroids and IV antibiotics. 4. IV Lasix and CHF orders. 5. Continue the patient's amiodarone at a presumed correct dose as we     do not know his actual dosage or frequency and will continue the     rest of his home medications as possible once we know what they     are.  I spent 42 minutes on this Critical Care admission.          ______________________________ Fran Lowes, DO     AS/MEDQ  D:  12/09/2010  T:  12/09/2010  Job:  [id number]  cc:   Gordy Savers, MD [address] [place]  [address]  Electronically Signed by Fran Lowes DO on 12/18/2010 02:21:40 PM

## 2010-12-18 NOTE — Discharge Summary (Signed)
[name], [name]                ACCOUNT NO.:  [id number]  MEDICAL RECORD NO.:  [id number]  LOCATION:  1413                         FACILITY:  [facility]  PHYSICIAN:  Elliot Cousin, M.D.    DATE OF BIRTH:  March 29, 1945  DATE OF ADMISSION:  12/09/2010 DATE OF DISCHARGE:  12/13/2010                              DISCHARGE SUMMARY   DISCHARGE DIAGNOSES: 1. Chronic obstructive pulmonary disease with bronchitic exacerbation. 2. Acute hypoxic respiratory failure secondary to chronic obstructive     pulmonary disease with exacerbation. 3. Presumed decompensated systolic congestive heart failure, however,     there was no evidence of pulmonary edema on the chest x-ray.  The     results of his 2D echocardiogram on December 10, 2010, were     significant for an ejection fraction of 50-55%, improved from 25%     in 2009. 4. Paroxysmal atrial fibrillation, chronically anticoagulated with     Coumadin. 5. Chronic kidney disease, with acute on chronic renal failure     secondary to lasix diuresis.  The patient's creatinine was 2.01 on     admission and 2.22 at the time of discharge.  His BUN was 67 prior     to discharge. 6. Hyponatremia, secondary to volume depletion following diuresis.     The patient's serum sodium fell to a nadir of 126, but     increased to 133 prior to discharge. 7. Hyperkalemia, secondary to acute on chronic kidney disease and     potassium supplementation. 8. Type 2 diabetes mellitus, uncontrolled.  His hemoglobin A1c was     10.8. 9. Elevated thyroid-stimulating hormone, but normal free thyroxine. 10.Hypertension, well-controlled.Marland Kitchen  DISCHARGE MEDICATIONS: 1. Levaquin 250 mg daily for two more days. 2. MiraLax laxative 17 g daily as needed for constipation. 3. Prednisone taper with 10 mg tablets as follows, take 5 tablets     daily for 1 day starting tomorrow, then 4 tablets next day, then 3     tablets the next day, then 2 tablets the next day, then 1 tablet     the  next day, then stop. 4. Albuterol inhaler 2 puffs three times daily. 5. Humalog insulin 20 units three times daily before meals.  The     patient was advised to not take this medication if his blood sugar     was less than 125. 6. Lantus insulin, the dose was increased from 25 units to 40 units     q.h.s. 7. Aciphex 20 mg daily. 8. Advair Diskus 250/50 1 puff b.i.d. 9. Amiodarone 200 mg b.i.d. 10.CoQ10 over-the-counter supplement 1 capsule b.i.d. 11.Furosemide 40 mg b.i.d. 12.Metoprolol XL 50 mg half a tablet daily. 13.Multivitamin 1 tablet daily. 14.Spiriva 18 mcg one capsule inhalation twice daily. 15.Temazepam 20 mg q.h.s. and as needed for sleep. 16.Tramadol 50 mg every 6 hours as needed for pain. 17.Vitamin B12 over-the-counter supplement 2 capsules daily. 18.Vytorin 10/40 1 tablet daily. 19.Warfarin 5 mg tablets.  Take 1 tablet on Mondays and Thursdays and     half a tablet all of the other days. 20.Stop potassium chloride. 21.Stop metolazone.  DISCHARGE DISPOSITION:  The patient was discharged home in improved and  stable condition on December 13, 2010.  He was advised to follow up with his primary care physician in less than 1 week and with his cardiologist, Dr. Lewayne Bunting in 1-2 weeks.  PROCEDURES PERFORMED: 1. 2D echocardiogram without contrast on December 10, 2010.  Results     revealed wall thickness of the left ventricle was increased in a     pattern of mild to moderate left ventricular hypertrophy.  Systolic     function was normal.  Ejection fraction was estimated to be in the     range of 50-55%.  Wall motion was normal.  Trivial aortic valve     regurgitation.  Mild mitral valve regurgitation.  Left atrium was     moderately dilated.  Right ventricle was mildly dilated.  Right     ventricular systolic function was moderately reduced.  Right atrium     was moderately dilated.  Pulmonary artery peak pressure was 50     mmHg.  Small pericardial effusion. 2.  V/Q scan on December 09, 2010.  The results revealed very low     probability for pulmonary embolism.  HISTORY OF PRESENTING ILLNESS:  The patient is a 65 year old man with a past medical history significant for congestive heart failure, COPD, coronary artery disease, paroxysmal atrial fibrillation, and type 2 diabetes mellitus.  He presented to the emergency department on December 09, 2010, with a chief complaint of shortness of breath.  On arrival to the emergency department, he was noted to be hypoxic with an oxygen saturation of 83% and tachycardic with a heart rate of 121 beats per minute.  His initial cardiac markers were unremarkable.  His EKG revealed right bundle branch block, left posterior fascicular block, T- wave abnormalities suggesting inferolateral ischemia, and a heart rate of 96 beats per minute.  His proBNP was 2181.  His INR was therapeutic at 2.32.  His chest x-ray revealed cardiac enlargement, but no acute cardiopulmonary disease.  His V/Q scan revealed very low probability for pulmonary embolism.  He was admitted for further evaluation and management.  HOSPITAL COURSE: 1. ACUTE HYPOXIC RESPIRATORY FAILURE, COPD WITH BRONCHITIC     EXACERBATION, CHRONIC CONGESTIVE HEART FAILURE, AND HISTORY OF     PAROXYSMAL ATRIAL FIBRILLATION.  Although the patient's chest x-ray     did not show pulmonary edema, because of his elevated BNP, it was     believed that he was experiencing decompensated congestive heart     failure.  Therefore, he was started on 80 mg of Lasix IV every 8     hours.  Concomitantly, his findings were suggestive of COPD with     bronchitic exacerbation.  He was therefore started on albuterol and     Atrovent nebulizations, oxygen titrated to keep his oxygen     saturations greater than 90%, IV Solu-Medrol, and IV Levaquin.  He     was maintained on metoprolol and amiodarone for rate control of     atrial fibrillation.  He was maintained on Coumadin.   For further     evaluation, cardiac enzymes, TSH, and a 2D echocardiogram were     ordered.  His cardiac enzymes revealed normal troponin I's.  His     TSH was slightly elevated at 5.0.  His T4, however, was within     normal limits at 1.0.  His 2D echocardiogram revealed an ejection     fraction of 50-55%, which apparently was much improved from his     previous  2D echocardiogram.  The echo also revealed pulmonary     hypertension with a peak pulmonary artery pressure of 50 mmHg and a     small pericardial effusion.  When the patient started diuresing     briskly, his electrolytes worsened.  The electrolyte abnormalities     were thought to be due to over diuresis with Lasix.  Therefore, the     IV Lasix was discontinued in favor of his usual home dose of 40 mg     twice daily.  Metolazone was discontinued.  Over the course of the     hospitalization, he became less symptomatic.  He began oxygenating     between 92 and 97% on room air.  He was without wheezing at the     time of hospital discharge.  Solu-Medrol was discontinued in favor     of prednisone.  Albuterol and Atrovent nebulizations were     discontinued in favor of Combivent inhaler.  He was eventually     discharged on Spiriva, albuterol inhaler, prednisone taper, and a     few more days of Levaquin.  All of his other cardiac medications     were continued as well.  The only exception was metolazone and     potassium chloride being discontinued. 2. ACUTE RENAL FAILURE SUPERIMPOSED ON STAGE III CHRONIC KIDNEY     DISEASE, HYPONATREMIA, AND HYPERKALEMIA.  On admission, the     patient's sodium was 133, BUN 31, potassium 4.6, and creatinine     2.01.  During the course of the hospitalization, his serum sodium     fell to a nadir of 126.  His serum potassium increased to 5.3.  His     BUN increased to a high of 71 and his creatinine increased to a     high of 2.32.  As above, metolazone was discontinued.  IV Lasix was      discontinued in favor of p.o.  He was started on gentle IV fluids.     He was given Kayexalate to treat hyperkalemia.  With these     measures, his electrolytes improved.  At the time of discharge, he     was instructed to not take metolazone until he is reevaluated by     his primary care physician and/or cardiologist.  He was also     advised to discontinue potassium chloride supplementation for now.     At the time of discharge, his serum sodium was 133, serum potassium     4.1, BUN 67, and creatinine 2.22. 3. TYPE 2 DIABETES MELLITUS, UNCONTROLLED IN PART SECONDARY TO     STEROIDS.  The patient was started on sliding scale NovoLog and     Lantus.  He takes Humalog and Lantus chronically at home.  His     venous glucose/capillary blood glucose became uncontrolled during     the hospitalization.  For at least 48 hours, his capillary blood     glucose ranged in the 300s to 400s.  He was started on IV fluid     hydration, following several days of IV Lasix therapy  NPH was     added periodically.  Sliding scale NovoLog was increased as was     Lantus.  Solu-Medrol was eventually discontinued.  Overall, with     these measures, his diabetes control improved.  His hemoglobin A1c     was noted to be greater than 10, indicating poor outpatient  control.  Adjustments were made in his insulin therapy for home     prior to discharge.  Hopefully, as the steroids are completely     tapered off, his glycemic control will improve progressively.     Elliot Cousin, M.D.     DF/MEDQ  D:  12/13/2010  T:  12/13/2010  Job:  [id number]  cc:   Gordy Savers, MD [address] [place] [address]  Doylene Canning. [name], MD [address]  [address] [place] [address]  Electronically Signed by Elliot Cousin M.D. on 12/18/2010 09:21:13 AM

## 2010-12-18 NOTE — Telephone Encounter (Signed)
Returned call to patient and spoke with him answering questions

## 2010-12-18 NOTE — Telephone Encounter (Signed)
Please call about procedure he going to have tomorrow

## 2010-12-19 ENCOUNTER — Ambulatory Visit (HOSPITAL_COMMUNITY)
Admission: RE | Admit: 2010-12-19 | Discharge: 2010-12-19 | Disposition: A | Discharge status: Home / Self Care | Payer: Medicare Other | Source: Ambulatory Visit | Attending: Cardiology | Admitting: Cardiology

## 2010-12-19 ENCOUNTER — Telehealth: Payer: Self-pay | Admitting: Physician Assistant

## 2010-12-19 DIAGNOSIS — I4891 Unspecified atrial fibrillation: Secondary | ICD-10-CM

## 2010-12-19 DIAGNOSIS — I252 Old myocardial infarction: Secondary | ICD-10-CM | POA: Insufficient documentation

## 2010-12-19 DIAGNOSIS — I10 Essential (primary) hypertension: Secondary | ICD-10-CM | POA: Insufficient documentation

## 2010-12-19 DIAGNOSIS — I2510 Atherosclerotic heart disease of native coronary artery without angina pectoris: Secondary | ICD-10-CM | POA: Insufficient documentation

## 2010-12-19 DIAGNOSIS — Z9861 Coronary angioplasty status: Secondary | ICD-10-CM | POA: Insufficient documentation

## 2010-12-19 DIAGNOSIS — Z01810 Encounter for preprocedural cardiovascular examination: Secondary | ICD-10-CM | POA: Insufficient documentation

## 2010-12-19 DIAGNOSIS — I428 Other cardiomyopathies: Secondary | ICD-10-CM | POA: Insufficient documentation

## 2010-12-19 DIAGNOSIS — I509 Heart failure, unspecified: Secondary | ICD-10-CM | POA: Insufficient documentation

## 2010-12-19 LAB — GLUCOSE, CAPILLARY: Glucose-Capillary: 253 mg/dL — ABNORMAL HIGH (ref 70–99)

## 2010-12-19 NOTE — Telephone Encounter (Signed)
Pt had DCCV today and post-proc had hypoxia. After more recovery from sedation, pt was taken off oxygen and ambulated. Sats decreased as low as 84% with ambulation but pt admitted he has not been ambulating very much at home recently. Sats recovered quickly and were >90%. Pt felt resp were at baseline but admits poor functional status. No home O2. Requested he call Dr Kirtland Bouchard and see him next week for further pulm assessment.

## 2010-12-22 ENCOUNTER — Telehealth: Payer: Self-pay | Admitting: Internal Medicine

## 2010-12-22 MED ORDER — HYDROCODONE-HOMATROPINE 5-1.5 MG/5ML PO SYRP: 5.0000 mL | ORAL_SOLUTION | Freq: Four times a day (QID) | ORAL | Status: DC | PRN

## 2010-12-22 NOTE — Telephone Encounter (Signed)
Wife is calling stating pt is coughing constantly and would like a cough RX called in to [facility]).

## 2010-12-22 NOTE — Telephone Encounter (Signed)
Generic Hydromet 6 ounces 1 teaspoon every 6 hours as needed for cough  

## 2010-12-24 ENCOUNTER — Ambulatory Visit (HOSPITAL_COMMUNITY)
Admission: RE | Admit: 2010-12-24 | Discharge: 2010-12-24 | Disposition: A | Discharge status: Home / Self Care | Payer: Medicare Other | Source: Ambulatory Visit | Attending: Physician Assistant | Admitting: Physician Assistant

## 2010-12-24 ENCOUNTER — Ambulatory Visit (INDEPENDENT_AMBULATORY_CARE_PROVIDER_SITE_OTHER): Payer: Medicare Other | Admitting: Physician Assistant

## 2010-12-24 ENCOUNTER — Ambulatory Visit (INDEPENDENT_AMBULATORY_CARE_PROVIDER_SITE_OTHER): Payer: Medicare Other | Admitting: Internal Medicine

## 2010-12-24 ENCOUNTER — Encounter: Payer: Self-pay | Admitting: Physician Assistant

## 2010-12-24 ENCOUNTER — Ambulatory Visit (INDEPENDENT_AMBULATORY_CARE_PROVIDER_SITE_OTHER): Payer: Medicare Other | Admitting: *Deleted

## 2010-12-24 ENCOUNTER — Telehealth: Payer: Self-pay

## 2010-12-24 ENCOUNTER — Encounter: Payer: Self-pay | Admitting: Internal Medicine

## 2010-12-24 VITALS — BP 126/74 | HR 105 | Ht 66.0 in | Wt 188.1 lb

## 2010-12-24 DIAGNOSIS — R059 Cough, unspecified: Secondary | ICD-10-CM | POA: Insufficient documentation

## 2010-12-24 DIAGNOSIS — I4891 Unspecified atrial fibrillation: Secondary | ICD-10-CM

## 2010-12-24 DIAGNOSIS — I5032 Chronic diastolic (congestive) heart failure: Secondary | ICD-10-CM

## 2010-12-24 DIAGNOSIS — I4892 Unspecified atrial flutter: Secondary | ICD-10-CM

## 2010-12-24 DIAGNOSIS — E119 Type 2 diabetes mellitus without complications: Secondary | ICD-10-CM

## 2010-12-24 DIAGNOSIS — I10 Essential (primary) hypertension: Secondary | ICD-10-CM

## 2010-12-24 DIAGNOSIS — J449 Chronic obstructive pulmonary disease, unspecified: Secondary | ICD-10-CM

## 2010-12-24 DIAGNOSIS — I509 Heart failure, unspecified: Secondary | ICD-10-CM

## 2010-12-24 DIAGNOSIS — R079 Chest pain, unspecified: Secondary | ICD-10-CM | POA: Insufficient documentation

## 2010-12-24 DIAGNOSIS — I517 Cardiomegaly: Secondary | ICD-10-CM | POA: Insufficient documentation

## 2010-12-24 DIAGNOSIS — R0602 Shortness of breath: Secondary | ICD-10-CM

## 2010-12-24 DIAGNOSIS — I2510 Atherosclerotic heart disease of native coronary artery without angina pectoris: Secondary | ICD-10-CM

## 2010-12-24 DIAGNOSIS — Z79899 Other long term (current) drug therapy: Secondary | ICD-10-CM

## 2010-12-24 DIAGNOSIS — R05 Cough: Secondary | ICD-10-CM | POA: Insufficient documentation

## 2010-12-24 LAB — BASIC METABOLIC PANEL
Calcium: 8 mg/dL — ABNORMAL LOW (ref 8.4–10.5)
GFR: 26.59 mL/min — ABNORMAL LOW (ref >60.00)
Glucose, Bld: 217 mg/dL — ABNORMAL HIGH (ref 70–99)
Sodium: 133 mEq/L — ABNORMAL LOW (ref 135–145)

## 2010-12-24 LAB — GLUCOSE, POCT (MANUAL RESULT ENTRY): POC Glucose: 246

## 2010-12-24 LAB — POCT INR: INR: 5.9

## 2010-12-24 MED ORDER — METOPROLOL SUCCINATE ER 50 MG PO TB24: 50.0000 mg | ORAL_TABLET | Freq: Two times a day (BID) | ORAL | Status: DC

## 2010-12-24 NOTE — Op Note (Signed)
  NAMEKENTRELL, HALLAHAN                ACCOUNT NO.:  [id number]  MEDICAL RECORD NO.:  [id number]  LOCATION:  [facility]                         FACILITY:  [facility]  PHYSICIAN:  Madolyn Frieze. [name], MD, FACCDATE OF BIRTH:  September 06, 1945  DATE OF PROCEDURE:  12/19/2010 DATE OF DISCHARGE:  12/19/2010                              OPERATIVE REPORT   PROCEDURE NOTE:  This is a cardioversion of atrial fibrillation.  The patient was sedated by Anesthesia with Diprivan 40 mg intravenously. Synchronized cardioversion with 120 joules resulted in sinus rhythm with PACs.  The patient then developed recurrent atrial fibrillation.  A second attempt with 120 joules again resulted in sinus rhythm.  There were no immediate complications.  We would recommend continuing Coumadin.  He will follow up with Dr. [name].     Madolyn Frieze [name], MD, [facility]     BSC/MEDQ  D:  12/19/2010  T:  12/19/2010  Job:  [id number]  Electronically Signed by Olga Millers MD [facility] on 12/24/2010 02:32:43 PM

## 2010-12-24 NOTE — Patient Instructions (Addendum)
Your physician recommends that you schedule a follow-up appointment in: 2 WEEKS WITH DR. [name] AS PER DR. ALLRED AND SCOTT WEAVER, PA-C  A chest x-ray DX 786.05 SOB takes a picture of the organs and structures inside the chest, including the heart, lungs, and blood vessels. This test can show several things, including, whether the heart is enlarges; whether fluid is building up in the lungs; and whether pacemaker / defibrillator leads are still in place.  Your physician has recommended you make the following change in your medication: INCREASE TOPROL XL 50 MG TWICE DAILY  Your physician recommends that you return for lab work in: BNP 427.31 AFIB  You have been referred to URGENT TO PULMONOLOGY THIS WEEK PER DR. ALLRED AND SCOTT WEAVER, PA-C DX 786.05 SOB

## 2010-12-24 NOTE — Assessment & Plan Note (Signed)
He is status post failed cardioversion on amiodarone.  As noted, he may not be a great candidate for amiodarone any further.  We will obtain a chest x-ray today.  Question if he needs his amiodarone discontinued.  I will bring him back in followup with Dr. [name] in the next 2 weeks to discuss that issue.  He will remain on Toprol 50 mg twice a day for better rate control.

## 2010-12-24 NOTE — Assessment & Plan Note (Signed)
As noted, his COPD seems to be the more likely culprit for his shortness of breath.  He will be evaluated by pulmonology.  On exam, he did have some increased volume but overall appeared to be stable.  As noted, his renal function has worsened and we have decreased his Lasix somewhat.

## 2010-12-24 NOTE — Assessment & Plan Note (Signed)
Refer to pulmonology as noted. 

## 2010-12-24 NOTE — Patient Instructions (Signed)
Limit your sodium (Salt) intake  Followup cardiology  Return in two weeks for follow-up  Emergency room  visit if you develop any worsening shortness of breath

## 2010-12-24 NOTE — Assessment & Plan Note (Addendum)
Likely multifactorial.  Suspect pulmonary disease is playing a larger role than atrial fibrillation or congestive heart failure.  I discussed the patient's case today with Dr. Johney Frame.  He also saw the patient and examined him.  At this point, he may not be a candidate for amiodarone any further.  He recently received antibiotics for a COPD exacerbation when he was hospitalized.  He does not clearly look volume overloaded on exam.  He has chronic lower extremity edema.  Basic metabolic panel drawn earlier today demonstrates increasing creatinine and lower potassium.  Dr. Johney Frame will speak with the patient's PCP tomorrow.  He spoke with the pulmonologist today and the patient will be seen by pulmonology in the near future for further evaluation.    A BNP is pending.  Given the results of his basic metabolic panel I have decreased his Lasix back to 40 mg twice a day.  His potassium is 3 and he will increase his dietary potassium with a repeat basic metabolic panel early next week.

## 2010-12-24 NOTE — Progress Notes (Signed)
History of Present Illness: Primary Electrophysiologist:  Dr. Lewayne Bunting   [name] is a 65 y.o. male with an ischemic cardiomyopathy, previous EF 25%, persistent atrial fibrillation, chronic systolic heart failure, and COPD.  He has a history of stent to the obtuse marginal.  Last heart catheterization 12/06: Mid to distal LAD 30%, D2 20%, proximal circumflex 30%, OM 2 stents patent, ostial RCA 50%, mid RCA 40%, distal RCA multiple 40%.  He has been in and out of atrial fibrillation for several years. He has been treated with amiodarone therapy. When his amiodarone dose is lowered he typically will go out of rhythm. He has chronic peripheral edema and has had tendencies towards volume overload. He has required fairly high dose diuretic.    He last saw Dr. [name] 8/17.  He was back in atrial fibrillation.  His rate was controlled.  His amiodarone was increased to 400 mg daily.  The plan was to proceed with DC cardioversion in one month.  However, he was admitted 9/18-9/22 with CHF + COPD exacerbation.  He was diuresed and treated with prednisone and antibiotics.  Echocardiogram 12/10/10: Mild-moderate LVH, EF 50-55%, mild MR, moderate LAE, moderate RAE, mild RVE with moderately reduced RV SF, PASP 50, small pericardial effusion.  He has CKD and creatinine did increase with diuresis.  Of note, a VQ scan was very low probability for pulmonary embolism.    I saw him last week and set him up for DCCV.  This was done 9/29 but was unsuccessful.  He was in the coumadin clinic today and noted increased dyspnea.  O2 sats were also noted to be low 88-90%.  He has a cough.  This is productive of white sputum.  Overall, this seems to be better.  He denies fevers but has had chills.  Sleeps on 2 pillows.  Has had some PND.  Notes his edema is unchanged.  Has had some chest pain with cough.  No syncope.  Saw PCP today.  Was told to decrease his Toprol to QD.  Inhaled meds were left the same.  Lasix dose was left the  same as well.    Past Medical History  Diagnosis Date  . ALCOHOL ABUSE, HX OF 04/30/2008  . Atrial fibrillation 12/17/2006  . Atrial flutter 09/20/2006  . 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 Chronic systolic heart failure 09/20/2006    previous EF 25%;  Echocardiogram 12/10/10: Mild-moderate LVH, EF 50-55%, mild MR, moderate LAE, moderate RAE, mild RVE with moderately reduced RV SF, PASP 50, small pericardial effusion  . COPD 12/17/2006  . CORONARY ARTERY DISEASE 09/20/2006    s/p stent to OM;  catheterization 12/06: Mid to distal LAD 30%, D2 20%, proximal circumflex 30%, OM 2 stents patent, ostial RCA 50%, mid RCA 40%, distal RCA multiple 40%  . 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 DYSPEPSIA&OTHER [facility] DISORDERS FUNCTION STOMACH 04/04/2009  . HYPERTENSION 12/17/2006  . OBESITY 04/30/2008  . HLD (hyperlipidemia) 09/20/2006  . OTITIS EXTERNA, CHRONIC NEC 12/28/2006  . PERSONAL HX COLONIC POLYPS 05/02/2009  . PSORIASIS 04/30/2008  . CKD (chronic kidney disease) 04/30/2008    creatinine 2.2 range    Current Outpatient Prescriptions  Medication Sig Dispense Refill  . albuterol (PROAIR HFA) 108 (90 BASE) MCG/ACT inhaler Inhale 2 puffs into the lungs every 6 (six) hours as needed.        Marland Kitchen amiodarone (PACERONE) 200 MG tablet Take 1 tablet (200 mg total) by mouth  2 (two) times daily.  60 tablet  11  . ezetimibe-simvastatin (VYTORIN) 10-40 MG per tablet Take 1 tablet by mouth at bedtime.        . Fluticasone-Salmeterol (ADVAIR DISKUS) 250-50 MCG/DOSE AEPB Inhale 1 puff into the lungs every 12 (twelve) hours.  60 each  4  . furosemide (LASIX) 40 MG tablet Take 1 tablet (40 mg total) by mouth 2 (two) times daily.  90 tablet  11  . HYDROcodone-homatropine (HYCODAN) 5-1.5 MG/5ML syrup Take 5 mLs by mouth every 6 (six) hours as needed for cough.  120 mL  0  . insulin glargine (LANTUS) 100 UNIT/ML injection 20 Units 3 (three) times daily. 40 units at bedtime        . insulin lispro (HUMALOG) 100 UNIT/ML injection 20 units prior to each meal; if blood sugar is in excess of 200 add an additional 8 units  10 mL  3  . Insulin Pen Needle (NOVOFINE) 32G X 6 MM MISC by Does not apply route 3 (three) times daily.        . metoprolol (TOPROL-XL) 50 MG 24 hr tablet Take 50 mg by mouth 2 (two) times daily.        . Multiple Vitamin (MULTIVITAMIN) tablet Take 1 tablet by mouth daily.        . RABEprazole (ACIPHEX) 20 MG tablet Take 20 mg by mouth daily.        . temazepam (RESTORIL) 30 MG capsule TAKE ONE CAPSULE BY MOUTH EVERY DAY AT BEDTIME  90 capsule  1  . tiotropium (SPIRIVA HANDIHALER) 18 MCG inhalation capsule Place 1 capsule (18 mcg total) into inhaler and inhale daily.  90 capsule  1  . traMADol (ULTRAM) 50 MG tablet TAKE ONE TABLET BY MOUTH EVERY 6 HOURS AS NEEDED  90 tablet  1  . warfarin (COUMADIN) 5 MG tablet Take by mouth as directed.         Allergies: Allergies  Allergen Reactions  . Aspirin     REACTION: Swelling, trouble breathing  . Tetracycline Hcl     REACTION: Swelling, trouble breathing    Social history:   Ex-smoker  ROS:  Please see the history of present illness.  All other systems reviewed and negative.   Vital Signs: BP 126/74  Pulse 105  Ht 5\' 6"  (1.676 m)  Wt 188 lb 1.9 oz (85.331 kg)  BMI 30.36 kg/m2  PHYSICAL EXAM: Well nourished, well developed, in no acute distress HEENT: normal Neck: no JVD Cardiac:  normal S1, S2; Irregularly irregular rhythm; no murmur Lungs:  Decreased breath sounds bilaterally, crackles at the bases that clears some with cough, no wheezing Abd: soft, nontender, no hepatomegaly Ext: 1-2+ bilateral edema Skin: warm and dry Neuro:  CNs 2-12 intact, no focal abnormalities noted Psych: Normal affect  EKG:  Atrial fibrillation, heart rate 105, rightward axis, right bundle branch block, T-wave inversions in 2, 3, aVF, V3-V6, no significant change compared with prior tracing  ASSESSMENT AND  PLAN:

## 2010-12-24 NOTE — Telephone Encounter (Signed)
Dr. Hillis Range called and would like Dr. Kirtland Bouchard to return his call about pt's visit.

## 2010-12-25 ENCOUNTER — Telehealth: Payer: Self-pay | Admitting: Physician Assistant

## 2010-12-25 ENCOUNTER — Encounter: Payer: Self-pay | Admitting: Internal Medicine

## 2010-12-25 LAB — BRAIN NATRIURETIC PEPTIDE: Pro B Natriuretic peptide (BNP): 487 pg/mL — ABNORMAL HIGH (ref 0.0–100.0)

## 2010-12-25 NOTE — Telephone Encounter (Signed)
Please tell patient we spoke with Dr. [name].  He wants the patient to stop taking Amiodarone. Tereso Newcomer, PA-C

## 2010-12-25 NOTE — Progress Notes (Signed)
  Subjective:    Patient ID: [name], male    DOB: 01/29/1946, 65 y.o.   MRN: [id number]  HPI  65 year old patient has a history of chronic systolic heart failure as well as COPD;  he has atrial fibrillation.  He apparently was successfully cardioverted on 9/28.  He is seen today for his one-month followup. He states that he is feeling fairly well. He is taken no medications today include his diuretic therapy as well as his inhalational medications he denies any change in his pulmonary status. He remains on Coumadin anticoagulation. He has a history of diabetes and remains on Lantus and mealtime insulin. He has a difficult time due to compliance due to the expense of his multiple medications.    Review of Systems  Constitutional: Negative for fever, chills, appetite change and fatigue.  HENT: Negative for hearing loss, ear pain, congestion, sore throat, trouble swallowing, neck stiffness, dental problem, voice change and tinnitus.   Eyes: Negative for pain, discharge and visual disturbance.  Respiratory: Positive for shortness of breath. Negative for cough, chest tightness, wheezing and stridor.   Cardiovascular: Positive for leg swelling. Negative for chest pain and palpitations.  Gastrointestinal: Negative for nausea, vomiting, abdominal pain, diarrhea, constipation, blood in stool and abdominal distention.  Genitourinary: Negative for urgency, hematuria, flank pain, discharge, difficulty urinating and genital sores.  Musculoskeletal: Negative for myalgias, back pain, joint swelling, arthralgias and gait problem.  Skin: Negative for rash.  Neurological: Negative for dizziness, syncope, speech difficulty, weakness, numbness and headaches.  Hematological: Negative for adenopathy. Does not bruise/bleed easily.  Psychiatric/Behavioral: Negative for behavioral problems and dysphoric mood. The patient is not nervous/anxious.        Objective:   Physical Exam  Constitutional: He is  oriented to person, place, and time. He appears well-developed and well-nourished.       Overweight. No distress  HENT:  Head: Normocephalic.  Right Ear: External ear normal.  Left Ear: External ear normal.  Eyes: Conjunctivae and EOM are normal.  Neck: Normal range of motion.  Cardiovascular: Normal rate and normal heart sounds.        Rhythm is regular with a rate of 90  Pulmonary/Chest: Effort normal. No respiratory distress. He has rales.       Maximum O2 saturation after rest 89%  Coarse rhonchi noted especially involving the lower one half lung fields bilaterally  Abdominal: Bowel sounds are normal.  Musculoskeletal: Normal range of motion. He exhibits edema. He exhibits no tenderness.       +2 peripheral edema  Neurological: He is alert and oriented to person, place, and time.  Psychiatric: He has a normal mood and affect. His behavior is normal.          Assessment & Plan:   Recurrent atrial fibrillation. The patient has taken no medications today including metoprolol he is asked to take his medication when he gets home as well as his diuretic therapy. Probably has a component of congestive heart failure. He is asked to follow up with cardiology this week. He is aware that he now has reverted back to atrial fibrillation COPD. Multiple samples provided Diabetes mellitus samples of Lantus and mealtime insulin dispensed. Compliance issues addressed

## 2010-12-25 NOTE — Telephone Encounter (Signed)
Called to patient and let him know to stop Amiodarone and keep follow up appointment

## 2010-12-26 NOTE — Progress Notes (Signed)
Addended by: Burnett Kanaris A on: 12/26/2010 09:20 AM   Modules accepted: Orders

## 2010-12-29 ENCOUNTER — Other Ambulatory Visit: Payer: Self-pay | Admitting: Internal Medicine

## 2010-12-29 ENCOUNTER — Encounter: Payer: Self-pay | Admitting: Internal Medicine

## 2010-12-29 ENCOUNTER — Institutional Professional Consult (permissible substitution): Payer: Medicare Other | Admitting: Pulmonary Disease

## 2010-12-29 ENCOUNTER — Ambulatory Visit (INDEPENDENT_AMBULATORY_CARE_PROVIDER_SITE_OTHER): Payer: Medicare Other | Admitting: *Deleted

## 2010-12-29 ENCOUNTER — Telehealth: Payer: Self-pay | Admitting: Pulmonary Disease

## 2010-12-29 DIAGNOSIS — I10 Essential (primary) hypertension: Secondary | ICD-10-CM

## 2010-12-29 LAB — BASIC METABOLIC PANEL
BUN: 36 mg/dL — ABNORMAL HIGH (ref 6–23)
GFR: 34.83 mL/min — ABNORMAL LOW (ref >60.00)
Potassium: 3.5 mEq/L (ref 3.5–5.1)

## 2010-12-29 NOTE — Telephone Encounter (Signed)
6 oz 

## 2010-12-29 NOTE — Telephone Encounter (Signed)
Called in to [facility] 

## 2010-12-29 NOTE — Telephone Encounter (Signed)
Per Victorino Dike, ok per Budd Palmer to have pt come in tomorrow to see [facility] at 10:15am but he needs to arrive at 10 am.  I called and spoke with pt.  I apologized for the miscommunication with the appt times.  I offered OV tomorrow with [facility] - he was requesting I speak with his wife regarding this because he states he "can't hear good."  I spoke with pt's wife, Bonita Quin.  She is ok with pt coming in tomorrow at 10 am for an OV with [facility] at 10:15 am.  Advised this has been scheduled and also apologized to her for the miscommunication.   I called Dr. Johney Frame accidentally on his cell phone and informed him of pt's pending appt with Dr. Shelle Iron for tomorrow.  I also call Tresa Endo, [place] and informed her of pt's pending appt with Dr. Shelle Iron for tomorrow and advised I have already spoke with pt and his wife regarding this and wife verbalized understanding of this.  Tresa Endo was appreciative of this and nothing further needed.

## 2010-12-29 NOTE — Telephone Encounter (Signed)
Pt is out of cough syrup. Pt req refill of HYDROcodone-homatropine (HYCODAN) 5-1.5 MG/5ML syrup to [facility] on [address] [phone]

## 2010-12-29 NOTE — Telephone Encounter (Signed)
Please advise - last written 12/22/10

## 2010-12-30 ENCOUNTER — Telehealth: Payer: Self-pay | Admitting: Pulmonary Disease

## 2010-12-30 ENCOUNTER — Ambulatory Visit (INDEPENDENT_AMBULATORY_CARE_PROVIDER_SITE_OTHER): Payer: Medicare Other | Admitting: Pulmonary Disease

## 2010-12-30 ENCOUNTER — Encounter: Payer: Self-pay | Admitting: Pulmonary Disease

## 2010-12-30 DIAGNOSIS — J449 Chronic obstructive pulmonary disease, unspecified: Secondary | ICD-10-CM

## 2010-12-30 DIAGNOSIS — J209 Acute bronchitis, unspecified: Secondary | ICD-10-CM | POA: Insufficient documentation

## 2010-12-30 DIAGNOSIS — J9610 Chronic respiratory failure, unspecified whether with hypoxia or hypercapnia: Secondary | ICD-10-CM | POA: Insufficient documentation

## 2010-12-30 MED ORDER — CEFDINIR 300 MG PO CAPS: ORAL_CAPSULE | ORAL | Status: DC

## 2010-12-30 MED ORDER — PREDNISONE 10 MG PO TABS: ORAL_TABLET | ORAL | Status: DC

## 2010-12-30 NOTE — Telephone Encounter (Signed)
I spoke with wife and she states she can't afford the blood pressure medicine that was called in for pt today. I advised her according to our notes Dr. Shelle Iron only sent the abx cefdinir and the prednisone taper. Wife verbalized understanding and had no further questions

## 2010-12-30 NOTE — Assessment & Plan Note (Signed)
The patient has chronic multifactorial respiratory failure is secondary to chronic heart failure as well as probable significant obstructive lung disease.  He is clearly volume overloaded today, and has rales on exam.  He tells me that he has an appointment with his cardiologist this week.  We'll go ahead and start the patient on supplemental oxygen.

## 2010-12-30 NOTE — Patient Instructions (Addendum)
Stay on current inhalers Will treat with an antibiotic and a course of prednisone for your current bronchitis and wheezing Will schedule for breathing studies in 4 weeks, and see you back same day for discussion. Keep your apptm with your cardiologist, you still have a lot of fluid to get rid of. Will start on oxygen at 2 liters 24hrs a day.

## 2010-12-30 NOTE — Assessment & Plan Note (Signed)
The patient has worsening congestion, as well as increased mucus productivity with white to beige mucus production.  I suspect he is developing an acute bronchitis, and we'll therefore treat with a round of antibiotics.

## 2010-12-30 NOTE — Assessment & Plan Note (Signed)
The patient obviously has some degree of COPD, but has never had PFTs.  He is on a very good bronchodilator regimen, and therefore there is nothing to change from this respect.  We'll go ahead and treat him for his acute bronchitis, and set up for pulmonary function studies in 4 weeks.

## 2010-12-30 NOTE — Progress Notes (Signed)
  Subjective:    Patient ID: [name], male    DOB: [birth year], 65 y.o.   MRN: [id number]  HPI The patient is a 65 -year-old male who I've been asked to see for breathing issues and possibly COPD.  He was recently in the hospital with worsening dyspnea and acute respiratory failure, and felt to have a COPD exacerbation and decompensated heart failure.  He was treated for both of the above entities, and had improvement.  He now feels that his breathing is getting worse.  He describes a less than one block dyspnea on exertion at a moderate pace, and most of the time will get winded just walking around the house.  He will get short of breath bringing groceries in from the car.  He has had a worsening cough over the last 2 weeks with thick white to discolored mucus, and thinks that he is getting a chest cold.  He has a long history of tobacco abuse, but has never had PFTs for documentation of COPD.  His last chest x-ray while he was in the hospital showed very significant cardiomegaly, prominent vascularity suggestive of edema, as well as atelectasis in the bases.  Patient has had significant worsening of his lower extremity edema since being out of the hospital.  He has known significant cardiac disease and also chronic renal insufficiency.  His oxygen saturations today were 87% on room air at rest.   Review of Systems  Constitutional: Positive for unexpected weight change. Negative for fever.  HENT: Positive for sore throat. Negative for ear pain, nosebleeds, congestion, rhinorrhea, sneezing, trouble swallowing, dental problem, postnasal drip and sinus pressure.   Eyes: Negative for redness and itching.  Respiratory: Positive for cough and shortness of breath. Negative for chest tightness and wheezing.   Cardiovascular: Positive for palpitations and leg swelling.  Gastrointestinal: Negative for nausea and vomiting.  Genitourinary: Negative for dysuria.  Musculoskeletal: Negative for joint  swelling.  Skin: Negative for rash.  Neurological: Positive for headaches.  Hematological: Does not bruise/bleed easily.  Psychiatric/Behavioral: Negative for dysphoric mood. The patient is not nervous/anxious.        Objective:   Physical Exam Constitutional:  Well developed, no acute distress  HENT:  Nares patent without discharge  Oropharynx without exudate, palate and uvula are normal  Eyes:  Perrla, eomi, no scleral icterus  Neck:  No JVD, no TMG  Cardiovascular: irregular rhythm, +summation gallop,   No murmurs        decreased distal pulses  Pulmonary :  adequate breath sounds, no stridor or respiratory distress   Crackles 1/3 up bilat, mild wheezing and rattling.  Abdominal:  Soft, nondistended, bowel sounds present.  No tenderness noted.   Musculoskeletal:  2-3 bilat LE edema, right >left  Lymph Nodes:  No cervical lymphadenopathy noted  Skin:  No cyanosis noted  Neurologic:  Alert, appropriate, moves all 4 extremities without obvious deficit.         Assessment & Plan:

## 2010-12-31 ENCOUNTER — Telehealth: Payer: Self-pay | Admitting: Internal Medicine

## 2010-12-31 ENCOUNTER — Encounter: Payer: Self-pay | Admitting: Pulmonary Disease

## 2011-01-01 ENCOUNTER — Ambulatory Visit (INDEPENDENT_AMBULATORY_CARE_PROVIDER_SITE_OTHER): Payer: Self-pay | Admitting: *Deleted

## 2011-01-01 ENCOUNTER — Ambulatory Visit (INDEPENDENT_AMBULATORY_CARE_PROVIDER_SITE_OTHER): Payer: Self-pay | Admitting: Internal Medicine

## 2011-01-01 ENCOUNTER — Encounter: Payer: Self-pay | Admitting: Internal Medicine

## 2011-01-01 DIAGNOSIS — I5042 Chronic combined systolic (congestive) and diastolic (congestive) heart failure: Secondary | ICD-10-CM

## 2011-01-01 DIAGNOSIS — I4891 Unspecified atrial fibrillation: Secondary | ICD-10-CM

## 2011-01-01 DIAGNOSIS — I4892 Unspecified atrial flutter: Secondary | ICD-10-CM

## 2011-01-01 LAB — BASIC METABOLIC PANEL
BUN: 29 — ABNORMAL HIGH
Creatinine, Ser: 1.32
GFR calc Af Amer: >60
GFR calc non Af Amer: 55 — ABNORMAL LOW
Potassium: 4.1

## 2011-01-01 LAB — CBC
HCT: 46.1
Hemoglobin: 15.2
MCHC: 33
MCV: 85.9
RDW: 15.9 — ABNORMAL HIGH

## 2011-01-01 LAB — POCT INR: INR: 4.8

## 2011-01-01 LAB — PROTIME-INR: INR: 2.2 — ABNORMAL HIGH

## 2011-01-01 NOTE — Patient Instructions (Signed)
Your physician recommends that you schedule a follow-up--will call us to schedule AV node ablation and Bi-V pacemaker

## 2011-01-02 ENCOUNTER — Encounter: Payer: Medicare Other | Admitting: *Deleted

## 2011-01-03 ENCOUNTER — Encounter: Payer: Self-pay | Admitting: Internal Medicine

## 2011-01-03 DIAGNOSIS — I5042 Chronic combined systolic (congestive) and diastolic (congestive) heart failure: Secondary | ICD-10-CM | POA: Insufficient documentation

## 2011-01-03 NOTE — Assessment & Plan Note (Signed)
His symptoms are now class 3, nearly 4. While his dyspnea is also related to COPD, I have recommended BiV PPM and AV node ablation. He will continue his current meds. He is not on an ACE or ARB due to worsening renal insufficiency.

## 2011-01-03 NOTE — Progress Notes (Signed)
HPI [name] has returned today for followup. He is a 65 yo man with chronic systolic heart failure, persistent atrial fibrillation intolerant of amiodarone, and poorly controlled ventricular rates, CAD, and oxygen requiring COPD. His chronic systolic heart failure is out of proportion to his CAD. He has had worsening dyspnea and volume overload and difficult to control ventricular rates.  Allergies  Allergen Reactions  . Aspirin     REACTION: Swelling, trouble breathing  . Tetracycline Hcl     REACTION: Swelling, trouble breathing     Current Outpatient Prescriptions  Medication Sig Dispense Refill  . albuterol (PROAIR HFA) 108 (90 BASE) MCG/ACT inhaler Inhale 2 puffs into the lungs every 6 (six) hours as needed.        . ezetimibe-simvastatin (VYTORIN) 10-40 MG per tablet Take 1 tablet by mouth at bedtime.        . Fluticasone-Salmeterol (ADVAIR DISKUS) 250-50 MCG/DOSE AEPB Inhale 1 puff into the lungs every 12 (twelve) hours.  60 each  4  . furosemide (LASIX) 40 MG tablet Take 1 tablet (40 mg total) by mouth 2 (two) times daily.  90 tablet  11  . HYDROMET 5-1.5 MG/5ML syrup TAKE 1 TEASPOONFUL BY MOUTH EVERY 6 HOURS AS NEEDED FOR COUGH  120 mL  0  . insulin glargine (LANTUS) 100 UNIT/ML injection 20 Units 3 (three) times daily. 40 units at bedtime       . insulin lispro (HUMALOG) 100 UNIT/ML injection 20 units prior to each meal; if blood sugar is in excess of 200 add an additional 8 units  10 mL  3  . Insulin Pen Needle (NOVOFINE) 32G X 6 MM MISC by Does not apply route 3 (three) times daily.        . metoprolol (TOPROL-XL) 50 MG 24 hr tablet Take 1 tablet (50 mg total) by mouth 2 (two) times daily.  60 tablet  11  . Multiple Vitamin (MULTIVITAMIN) tablet Take 1 tablet by mouth daily.        . RABEprazole (ACIPHEX) 20 MG tablet Take 20 mg by mouth daily.        . temazepam (RESTORIL) 30 MG capsule TAKE ONE CAPSULE BY MOUTH EVERY DAY AT BEDTIME  90 capsule  1  . tiotropium (SPIRIVA  HANDIHALER) 18 MCG inhalation capsule Place 1 capsule (18 mcg total) into inhaler and inhale daily.  90 capsule  1  . traMADol (ULTRAM) 50 MG tablet TAKE ONE TABLET BY MOUTH EVERY 6 HOURS AS NEEDED  90 tablet  1  . warfarin (COUMADIN) 5 MG tablet Take by mouth as directed.          Past Medical History  Diagnosis Date  . ALCOHOL ABUSE, HX OF 04/30/2008  . Atrial fibrillation 12/17/2006  . Atrial flutter 09/20/2006  . 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 Chronic systolic heart failure 09/20/2006    previous EF 25%;  Echocardiogram 12/10/10: Mild-moderate LVH, EF 50-55%, mild MR, moderate LAE, moderate RAE, mild RVE with moderately reduced RV SF, PASP 50, small pericardial effusion  . COPD 12/17/2006  . CORONARY ARTERY DISEASE 09/20/2006    s/p stent to OM;  catheterization 12/06: Mid to distal LAD 30%, D2 20%, proximal circumflex 30%, OM 2 stents patent, ostial RCA 50%, mid RCA 40%, distal RCA multiple 40%  . 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 DYSPEPSIA&OTHER [facility] DISORDERS FUNCTION STOMACH 04/04/2009  . HYPERTENSION 12/17/2006  . OBESITY 04/30/2008  . HLD (hyperlipidemia) 09/20/2006  .  OTITIS EXTERNA, CHRONIC NEC 12/28/2006  . PERSONAL HX COLONIC POLYPS 05/02/2009  . PSORIASIS 04/30/2008  . CKD (chronic kidney disease) 04/30/2008    creatinine 2.2 range    ROS:   All systems reviewed and negative except as noted in the HPI.   Past Surgical History  Procedure Date  . Cardiac defibrillator placement   . Angioplasty   . Hernia repair     bilat     Family History  Problem Relation Age of Onset  . Lung cancer Mother     and aunt  . Heart attack Father   . Diabetes Sister     and aunt  . Colon cancer Neg Hx      History   Social History  . Marital Status: Married    Spouse Name: N/A    Number of Children: 0  . Years of Education: N/A   Occupational History  . [employer]    Social History Main Topics  . Smoking status:  Former Smoker -- 1.0 packs/day for 50 years    Types: Cigarettes    Quit date: 03/23/2005  . Smokeless tobacco: Never Used  . Alcohol Use: No     quit drinking 2007  . Drug Use: No  . Sexually Active: Not on file   Other Topics Concern  . Not on file   Social History Narrative  . No narrative on file     BP 137/84  Pulse 70  Ht 5\' 6"  (1.676 m)  Wt 196 lb (88.905 kg)  BMI 31.64 kg/m2  Physical Exam:  Chronically ill appearing middle aged man, NAD HEENT: Unremarkable. Diskempt Neck:  No JVD, no thyromegally Lymphatics:  No adenopathy Back:  No CVA tenderness Lungs:  Scattered wheezes, rales HEART:  Iregular rate rhythm, no murmurs, no rubs, no clicks Abd:  Soft, obese, positive bowel sounds, no organomegally, no rebound, no guarding Ext:  2 plus pulses, no edema, no cyanosis, no clubbing Skin:  No rashes no nodules Neuro:  CN II through XII intact, motor grossly intact   Assess/Plan:

## 2011-01-03 NOTE — Assessment & Plan Note (Signed)
His ventricular rate is not well controlled and maintenance of NSR is very unlikely. I have recommended AV node ablation and BiV PPM. He is considering his options and will call us if he wishes to proceed with this procedure.

## 2011-01-05 LAB — DIFFERENTIAL
Eosinophils Absolute: 0
Lymphs Abs: 0.3 — ABNORMAL LOW
Monocytes Relative: 7
Neutrophils Relative %: 89 — ABNORMAL HIGH

## 2011-01-05 LAB — BASIC METABOLIC PANEL
BUN: 25 — ABNORMAL HIGH
BUN: 27 — ABNORMAL HIGH
CO2: 31
CO2: 31
Calcium: 9.1
Chloride: 94 — ABNORMAL LOW
Chloride: 97
Chloride: 98
Creatinine, Ser: 1.35
GFR calc Af Amer: >60
GFR calc Af Amer: >60
GFR calc Af Amer: >60
GFR calc Af Amer: >60
GFR calc non Af Amer: 52 — ABNORMAL LOW
GFR calc non Af Amer: 57 — ABNORMAL LOW
GFR calc non Af Amer: >60
Glucose, Bld: 109 — ABNORMAL HIGH
Glucose, Bld: 120 — ABNORMAL HIGH
Potassium: 4.5
Potassium: 4.7
Potassium: 4.8
Potassium: 4.8
Sodium: 137
Sodium: 137
Sodium: 139
Sodium: 140
Sodium: 141

## 2011-01-05 LAB — POCT CARDIAC MARKERS
CKMB, poc: <1 — ABNORMAL LOW
Myoglobin, poc: 127
Operator id: 161631
Operator id: 161631
Troponin i, poc: 0.28 — ABNORMAL HIGH

## 2011-01-05 LAB — CBC
HCT: 42.9
HCT: 44.6
HCT: 44.7
HCT: 45.8
HCT: 46
Hemoglobin: 15
Hemoglobin: 15
Hemoglobin: 15
Hemoglobin: 15.2
Hemoglobin: 15.9
Hemoglobin: 16.4
MCHC: 32.9
MCHC: 33.2
MCV: 86.6
MCV: 87.1
MCV: 87.3
MCV: 88.2
MCV: 88.4
MCV: 90
Platelets: 178
Platelets: 181
Platelets: 201
RBC: 4.76
RBC: 4.96
RBC: 5.23
RBC: 5.28
RBC: 5.58
RDW: 16 — ABNORMAL HIGH
RDW: 16.6 — ABNORMAL HIGH
WBC: 6.8
WBC: 8.2
WBC: 8.4
WBC: 9.1
WBC: 9.3

## 2011-01-05 LAB — PROTIME-INR
INR: 1.9 — ABNORMAL HIGH
INR: 2.8 — ABNORMAL HIGH
Prothrombin Time: 15.7 — ABNORMAL HIGH
Prothrombin Time: 22.4 — ABNORMAL HIGH
Prothrombin Time: 33 — ABNORMAL HIGH

## 2011-01-05 LAB — I-STAT 8, (EC8 V) (CONVERTED LAB)
BUN: 21
Bicarbonate: 28.3 — ABNORMAL HIGH
Glucose, Bld: 273 — ABNORMAL HIGH
Hemoglobin: 17.3 — ABNORMAL HIGH
Potassium: 4.8
Sodium: 130 — ABNORMAL LOW

## 2011-01-05 LAB — HEPARIN LEVEL (UNFRACTIONATED): Heparin Unfractionated: 0.34

## 2011-01-05 LAB — CARDIAC PANEL(CRET KIN+CKTOT+MB+TROPI): Troponin I: 0.25 — ABNORMAL HIGH

## 2011-01-05 LAB — POCT I-STAT CREATININE: Creatinine, Ser: 1.4

## 2011-01-08 ENCOUNTER — Encounter: Payer: Self-pay | Admitting: *Deleted

## 2011-01-08 ENCOUNTER — Telehealth: Payer: Self-pay | Admitting: *Deleted

## 2011-01-08 ENCOUNTER — Ambulatory Visit (INDEPENDENT_AMBULATORY_CARE_PROVIDER_SITE_OTHER): Payer: Self-pay | Admitting: Internal Medicine

## 2011-01-08 ENCOUNTER — Encounter: Payer: Self-pay | Admitting: Internal Medicine

## 2011-01-08 ENCOUNTER — Other Ambulatory Visit: Payer: Self-pay | Admitting: Internal Medicine

## 2011-01-08 DIAGNOSIS — E119 Type 2 diabetes mellitus without complications: Secondary | ICD-10-CM

## 2011-01-08 DIAGNOSIS — I4891 Unspecified atrial fibrillation: Secondary | ICD-10-CM

## 2011-01-08 DIAGNOSIS — I2510 Atherosclerotic heart disease of native coronary artery without angina pectoris: Secondary | ICD-10-CM

## 2011-01-08 DIAGNOSIS — J9610 Chronic respiratory failure, unspecified whether with hypoxia or hypercapnia: Secondary | ICD-10-CM

## 2011-01-08 DIAGNOSIS — I10 Essential (primary) hypertension: Secondary | ICD-10-CM

## 2011-01-08 LAB — GLUCOSE, POCT (MANUAL RESULT ENTRY): POC Glucose: 63

## 2011-01-08 MED ORDER — METOLAZONE 2.5 MG PO TABS: ORAL_TABLET | ORAL | Status: DC

## 2011-01-08 MED ORDER — INSULIN GLARGINE 100 UNIT/ML ~~LOC~~ SOLN: SUBCUTANEOUS | Status: DC

## 2011-01-08 MED ORDER — INSULIN LISPRO 100 UNIT/ML ~~LOC~~ SOLN: SUBCUTANEOUS | Status: DC

## 2011-01-08 NOTE — Progress Notes (Signed)
  Subjective:    Patient ID: [name], male    DOB: [birth year], 65 y.o.   MRN: [id number]  HPI  65 year old patient who has COPD and chronic systolic heart failure. He has chronic atrial fibrillation and chronic hypoxic respiratory failure. He is on chronic home O2. He has diabetes and is now on multiple daily injections. His appetite has been poor and he has had some hypoglycemic episodes over the past few days. Since his last visit here he has had progressive lower extremity edema and worsening shortness of breath.    Review of Systems  Constitutional: Positive for fatigue.  Respiratory: Positive for shortness of breath.   Cardiovascular: Positive for leg swelling.  Neurological: Positive for weakness.       Objective:   Physical Exam  Constitutional: He is oriented to person, place, and time. He appears well-developed.       Overweight no distress. Random blood sugar 54. Nasal cannula oxygen in place  HENT:  Head: Normocephalic.  Right Ear: External ear normal.  Left Ear: External ear normal.  Eyes: Conjunctivae and EOM are normal.  Neck: Normal range of motion.  Cardiovascular: Normal rate and normal heart sounds.        Controlled ventricular response irregular  Pulmonary/Chest: Effort normal and breath sounds normal.       Coarse rhonchi O2 saturation 97 Pulse rate 85  Abdominal: Bowel sounds are normal.  Musculoskeletal: Normal range of motion. He exhibits edema. He exhibits no tenderness.       +3 pedal edema  Neurological: He is alert and oriented to person, place, and time.  Psychiatric: He has a normal mood and affect. His behavior is normal.          Assessment & Plan:

## 2011-01-08 NOTE — Telephone Encounter (Signed)
Pt has appt in office today at 1245p

## 2011-01-08 NOTE — Telephone Encounter (Signed)
[facility] Triage Call Report Triage Record Num: [id number] Operator: Edgar Frisk Patient Name: [name] Call Date & Time: 01/08/2011 2:37:53AM Patient Phone: [phone] PCP: Gordy Savers Patient Gender: Male PCP Fax : [phone] Patient DOB: 05/03/1945 Practice Name: [facility] Reason for Call: [name], calling regarding Other. PCP is [name]. Callback number is [phone]. Pt reports his feet and legs are swollen tonight , Tonight was SOB but found that his O2 had become disconnected. Once connected , SOB was relieved. States feels fine now. Has appt in office in am Advised to call back if worsens Care advice per SOB Guideline SS for call back reviewed Protocol(s) Used: Breathing Problems Recommended Outcome per Protocol: See Provider within 24 hours Reason for Outcome: Gradual onset of breathing problems occur when lying down OR that limit normal activity Care Advice: ~ Call provider if symptoms worsen or new symptoms develop. If home oxygen has been prescribed, apply it at the recommended flow rate; DO NOT increase the flow rate before consulting provider. ~ ~ Rest in a reclining chair or elevate head and chest on 2 or 3 pillows when lying down to make breathing easier. ~ SYMPTOM / CONDITION MANAGEMENT ~ Call provider if develop sudden weight gain, swelling of feet and/or pain in the abdomen, fatigue or trouble sleeping. ~ List, or take, all current prescription(s), nonprescription or alternative medication(s) to provider for evaluation. 01/08/2011 3:12:11AM Page 1 of 1 [email or web address]

## 2011-01-08 NOTE — Patient Instructions (Signed)
Decrease Lantus to 30 units at bedtime Humalog 15 units prior to each meal only  Limit your sodium (Salt) intake

## 2011-01-09 ENCOUNTER — Ambulatory Visit (INDEPENDENT_AMBULATORY_CARE_PROVIDER_SITE_OTHER): Payer: Self-pay | Admitting: *Deleted

## 2011-01-09 DIAGNOSIS — I4892 Unspecified atrial flutter: Secondary | ICD-10-CM

## 2011-01-09 DIAGNOSIS — I4891 Unspecified atrial fibrillation: Secondary | ICD-10-CM

## 2011-01-09 LAB — POCT INR: INR: 4

## 2011-01-09 MED ORDER — WARFARIN SODIUM 2 MG PO TABS: ORAL_TABLET | ORAL | Status: DC

## 2011-01-15 ENCOUNTER — Ambulatory Visit: Payer: Self-pay | Admitting: Internal Medicine

## 2011-01-19 ENCOUNTER — Ambulatory Visit (INDEPENDENT_AMBULATORY_CARE_PROVIDER_SITE_OTHER): Payer: Medicare Other | Admitting: *Deleted

## 2011-01-19 DIAGNOSIS — I4892 Unspecified atrial flutter: Secondary | ICD-10-CM

## 2011-01-19 DIAGNOSIS — I4891 Unspecified atrial fibrillation: Secondary | ICD-10-CM

## 2011-01-19 LAB — POCT INR: INR: 3.2

## 2011-01-20 ENCOUNTER — Telehealth: Payer: Self-pay | Admitting: Internal Medicine

## 2011-01-20 NOTE — Telephone Encounter (Signed)
Office visit for evaluation since cough syrup is not working

## 2011-01-20 NOTE — Telephone Encounter (Signed)
Pt has severe coughing and was given a cough syrup that is not working. Would like another cough syrup called to [facility]. Wife knows that Dr Kirtland Bouchard is out until next Wednesday, but needs syrup asap. Thanks.

## 2011-01-20 NOTE — Telephone Encounter (Signed)
Left message on machine for patient

## 2011-01-21 ENCOUNTER — Other Ambulatory Visit: Payer: Self-pay

## 2011-01-21 MED ORDER — INSULIN LISPRO 100 UNIT/ML ~~LOC~~ SOLN: SUBCUTANEOUS | Status: DC

## 2011-01-21 NOTE — Telephone Encounter (Signed)
Increase in dosage - new rx faxed to [facility]. At [phone]

## 2011-01-23 ENCOUNTER — Institutional Professional Consult (permissible substitution): Payer: Self-pay | Admitting: Pulmonary Disease

## 2011-01-28 ENCOUNTER — Ambulatory Visit: Payer: Medicare Other | Admitting: Pulmonary Disease

## 2011-02-02 ENCOUNTER — Ambulatory Visit (INDEPENDENT_AMBULATORY_CARE_PROVIDER_SITE_OTHER): Payer: Medicare Other | Admitting: *Deleted

## 2011-02-02 DIAGNOSIS — I4891 Unspecified atrial fibrillation: Secondary | ICD-10-CM

## 2011-02-02 DIAGNOSIS — I4892 Unspecified atrial flutter: Secondary | ICD-10-CM

## 2011-02-05 ENCOUNTER — Other Ambulatory Visit: Payer: Self-pay | Admitting: Internal Medicine

## 2011-02-05 ENCOUNTER — Telehealth: Payer: Self-pay

## 2011-02-05 MED ORDER — PHENYLEPH-DEXBROMPHEN-CHLOPHED 5-1-12.5 MG/5ML PO LIQD: 5.0000 mL | Freq: Every day | ORAL | Status: DC

## 2011-02-05 NOTE — Telephone Encounter (Signed)
Pt's wife walked in office and stated her husband needed a stronger cough medicine.  Per wife:  He has been coughing every second. Last year or year before last he broke 3 to 4 ribs coughing so much.  Pls advise.

## 2011-02-05 NOTE — Telephone Encounter (Signed)
Generic tussinex  4 oz  One tsp every 12 hours

## 2011-02-05 NOTE — Telephone Encounter (Signed)
rx called in to pharmacy for tussionex.  Pt aware.

## 2011-02-06 ENCOUNTER — Encounter (HOSPITAL_COMMUNITY): Payer: Self-pay | Admitting: Adult Health

## 2011-02-06 ENCOUNTER — Emergency Department (HOSPITAL_COMMUNITY): Payer: Medicare Other

## 2011-02-06 ENCOUNTER — Inpatient Hospital Stay (HOSPITAL_COMMUNITY)
Admission: EM | Admit: 2011-02-06 | Discharge: 2011-02-13 | DRG: 291 | Disposition: A | Discharge status: Home / Self Care | Payer: Medicare Other | Attending: Internal Medicine | Admitting: Internal Medicine

## 2011-02-06 ENCOUNTER — Other Ambulatory Visit: Payer: Self-pay

## 2011-02-06 DIAGNOSIS — I10 Essential (primary) hypertension: Secondary | ICD-10-CM | POA: Diagnosis present

## 2011-02-06 DIAGNOSIS — I2510 Atherosclerotic heart disease of native coronary artery without angina pectoris: Secondary | ICD-10-CM | POA: Diagnosis present

## 2011-02-06 DIAGNOSIS — N259 Disorder resulting from impaired renal tubular function, unspecified: Secondary | ICD-10-CM | POA: Diagnosis present

## 2011-02-06 DIAGNOSIS — J441 Chronic obstructive pulmonary disease with (acute) exacerbation: Secondary | ICD-10-CM | POA: Diagnosis present

## 2011-02-06 DIAGNOSIS — N289 Disorder of kidney and ureter, unspecified: Secondary | ICD-10-CM | POA: Diagnosis present

## 2011-02-06 DIAGNOSIS — K5900 Constipation, unspecified: Secondary | ICD-10-CM | POA: Diagnosis present

## 2011-02-06 DIAGNOSIS — J449 Chronic obstructive pulmonary disease, unspecified: Secondary | ICD-10-CM

## 2011-02-06 DIAGNOSIS — E162 Hypoglycemia, unspecified: Secondary | ICD-10-CM | POA: Diagnosis present

## 2011-02-06 DIAGNOSIS — E876 Hypokalemia: Secondary | ICD-10-CM | POA: Diagnosis present

## 2011-02-06 DIAGNOSIS — I4891 Unspecified atrial fibrillation: Secondary | ICD-10-CM | POA: Diagnosis present

## 2011-02-06 DIAGNOSIS — R079 Chest pain, unspecified: Secondary | ICD-10-CM | POA: Diagnosis present

## 2011-02-06 DIAGNOSIS — J9620 Acute and chronic respiratory failure, unspecified whether with hypoxia or hypercapnia: Secondary | ICD-10-CM | POA: Diagnosis not present

## 2011-02-06 DIAGNOSIS — I5042 Chronic combined systolic (congestive) and diastolic (congestive) heart failure: Secondary | ICD-10-CM | POA: Diagnosis present

## 2011-02-06 DIAGNOSIS — I509 Heart failure, unspecified: Secondary | ICD-10-CM | POA: Diagnosis present

## 2011-02-06 DIAGNOSIS — J9610 Chronic respiratory failure, unspecified whether with hypoxia or hypercapnia: Secondary | ICD-10-CM | POA: Diagnosis present

## 2011-02-06 DIAGNOSIS — I5043 Acute on chronic combined systolic (congestive) and diastolic (congestive) heart failure: Principal | ICD-10-CM | POA: Diagnosis present

## 2011-02-06 DIAGNOSIS — E1169 Type 2 diabetes mellitus with other specified complication: Secondary | ICD-10-CM | POA: Diagnosis present

## 2011-02-06 DIAGNOSIS — E119 Type 2 diabetes mellitus without complications: Secondary | ICD-10-CM | POA: Diagnosis present

## 2011-02-06 LAB — CARDIAC PANEL(CRET KIN+CKTOT+MB+TROPI)
CK, MB: 5.3 ng/mL — ABNORMAL HIGH (ref 0.3–4.0)
Relative Index: INVALID (ref 0.0–2.5)
Total CK: 97 U/L (ref 7–232)
Troponin I: <0.3 ng/mL (ref <0.30)

## 2011-02-06 LAB — DIFFERENTIAL
Basophils Absolute: 0.1 10*3/uL (ref 0.0–0.1)
Basophils Relative: 1 % (ref 0–1)
Monocytes Relative: 10 % (ref 3–12)
Neutro Abs: 7.9 10*3/uL — ABNORMAL HIGH (ref 1.7–7.7)
Neutrophils Relative %: 81 % — ABNORMAL HIGH (ref 43–77)

## 2011-02-06 LAB — BASIC METABOLIC PANEL
BUN: 30 mg/dL — ABNORMAL HIGH (ref 6–23)
Chloride: 95 mEq/L — ABNORMAL LOW (ref 96–112)
GFR calc Af Amer: 46 mL/min — ABNORMAL LOW (ref >90)
Potassium: 3.4 mEq/L — ABNORMAL LOW (ref 3.5–5.1)
Sodium: 139 mEq/L (ref 135–145)

## 2011-02-06 LAB — CBC
Hemoglobin: 12.9 g/dL — ABNORMAL LOW (ref 13.0–17.0)
MCHC: 31.3 g/dL (ref 30.0–36.0)

## 2011-02-06 MED ORDER — IPRATROPIUM BROMIDE 0.02 % IN SOLN
0.5000 mg | Freq: Once | RESPIRATORY_TRACT | Status: AC
  Administered 2011-02-06: 0.5 mg via RESPIRATORY_TRACT
  Filled 2011-02-06: qty 2.5

## 2011-02-06 MED ORDER — PREDNISONE 20 MG PO TABS
60.0000 mg | ORAL_TABLET | Freq: Once | ORAL | Status: AC
  Administered 2011-02-06: 60 mg via ORAL
  Filled 2011-02-06: qty 3

## 2011-02-06 MED ORDER — FUROSEMIDE 10 MG/ML IJ SOLN
40.0000 mg | Freq: Once | INTRAMUSCULAR | Status: AC
  Administered 2011-02-07: 40 mg via INTRAVENOUS
  Filled 2011-02-06: qty 4

## 2011-02-06 MED ORDER — ALBUTEROL SULFATE (5 MG/ML) 0.5% IN NEBU
5.0000 mg | INHALATION_SOLUTION | Freq: Once | RESPIRATORY_TRACT | Status: AC
  Administered 2011-02-06: 5 mg via RESPIRATORY_TRACT
  Filled 2011-02-06: qty 0.5

## 2011-02-06 NOTE — ED Notes (Signed)
SOB that began this AM after power went out, pt uses home O2, had an episode of dizziness, diaphoresis, chest pain. Tried to manage at home wqas unable.

## 2011-02-06 NOTE — ED Notes (Signed)
ISTAT Troponin result not appearing on chart due to interface error.   Result for test is Troponin= cTnI ng/ml=0.09 Result reported to Dr. Anitra Lauth

## 2011-02-06 NOTE — ED Provider Notes (Signed)
History     CSN: [id number] Arrival date & time: 02/06/2011  7:41 PM   First MD Initiated Contact with Patient 02/06/11 2116      Chief Complaint  Patient presents with  . Shortness of Breath    (Consider location/radiation/quality/duration/timing/severity/associated sxs/prior treatment) HPI Comments: Pt states he walked to the breaker box tonight and became extremely SOB and near syncope and had chest pain which has now resolved.  However states the SOB has just worsened over the last 2 weeks.  Patient is a 65 y.o. male presenting with shortness of breath. The history is provided by the patient.  Shortness of Breath  The current episode started more than 1 week ago. The onset was gradual. The problem occurs continuously. The problem has been gradually worsening. The problem is moderate. The symptoms are relieved by nothing. The symptoms are aggravated by activity and a supine position. Associated symptoms include chest pressure, orthopnea, cough, shortness of breath and wheezing. The cough has no precipitants. The cough is productive. The cough is worsened by activity. Urine output has decreased. There were no sick contacts. He has received no recent medical care.    Past Medical History  Diagnosis Date  . ALCOHOL ABUSE, HX OF 04/30/2008  . Atrial fibrillation 12/17/2006  . Atrial flutter 09/20/2006  . 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 Chronic systolic heart failure 09/20/2006    previous EF 25%;  Echocardiogram 12/10/10: Mild-moderate LVH, EF 50-55%, mild MR, moderate LAE, moderate RAE, mild RVE with moderately reduced RV SF, PASP 50, small pericardial effusion  . COPD 12/17/2006  . CORONARY ARTERY DISEASE 09/20/2006    s/p stent to OM;  catheterization 12/06: Mid to distal LAD 30%, D2 20%, proximal circumflex 30%, OM 2 stents patent, ostial RCA 50%, mid RCA 40%, distal RCA multiple 40%  . 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  DYSPEPSIA&OTHER [facility] DISORDERS FUNCTION STOMACH 04/04/2009  . HYPERTENSION 12/17/2006  . OBESITY 04/30/2008  . HLD (hyperlipidemia) 09/20/2006  . OTITIS EXTERNA, CHRONIC NEC 12/28/2006  . PERSONAL HX COLONIC POLYPS 05/02/2009  . PSORIASIS 04/30/2008  . CKD (chronic kidney disease) 04/30/2008    creatinine 2.2 range    Past Surgical History  Procedure Date  . Cardiac defibrillator placement   . Angioplasty   . Hernia repair     bilat    Family History  Problem Relation Age of Onset  . Lung cancer Mother     and aunt  . Heart attack Father   . Diabetes Sister     and aunt  . Colon cancer Neg Hx     History  Substance Use Topics  . Smoking status: Former Smoker -- 1.0 packs/day for 50 years    Types: Cigarettes    Quit date: 03/23/2005  . Smokeless tobacco: Never Used  . Alcohol Use: No     quit drinking 2007      Review of Systems  Respiratory: Positive for cough, shortness of breath and wheezing.   Cardiovascular: Positive for orthopnea and leg swelling.  Gastrointestinal: Negative for nausea and diarrhea.  All other systems reviewed and are negative.    Allergies  Aspirin and Tetracycline hcl  Home Medications   Current Outpatient Rx  Name Route Sig Dispense Refill  . ALBUTEROL SULFATE HFA 108 (90 BASE) MCG/ACT IN AERS Inhalation Inhale 2 puffs into the lungs every 6 (six) hours as needed.      Marland Kitchen COENZYME Q10 30 MG PO CAPS  Oral Take 30 mg by mouth daily.      Marland Kitchen EZETIMIBE-SIMVASTATIN 10-40 MG PO TABS Oral Take 1 tablet by mouth at bedtime.      Marland Kitchen FLUTICASONE-SALMETEROL 250-50 MCG/DOSE IN AEPB Inhalation Inhale 1 puff into the lungs every 12 (twelve) hours. 60 each 4  . FUROSEMIDE 40 MG PO TABS Oral Take 80 mg by mouth 2 (two) times daily.      . INSULIN GLARGINE 100 UNIT/ML [place] SOLN  30 units at bedtime 10 mL 3  . INSULIN LISPRO (HUMAN) 100 UNIT/ML  SOLN  Inject 20 units before each meal - if blood sugars more than 200 add 8 units. 45 mL 6  . METOPROLOL SUCCINATE  50 MG PO TB24 Oral Take 1 tablet (50 mg total) by mouth 2 (two) times daily. 60 tablet 11  . ONE-DAILY MULTI VITAMINS PO TABS Oral Take 1 tablet by mouth daily.      Marland Kitchen HYDROCODONE-PE-CHLORPHENIRAMIN PO Oral Take 5 mLs by mouth every 12 (twelve) hours.      Marland Kitchen RABEPRAZOLE SODIUM 20 MG PO TBEC Oral Take 20 mg by mouth daily.      Marland Kitchen TEMAZEPAM 30 MG PO CAPS  TAKE ONE CAPSULE BY MOUTH AT BEDTIME AS NEEDED FOR SLEEP 30 capsule 1  . TIOTROPIUM BROMIDE MONOHYDRATE 18 MCG IN CAPS Inhalation Place 1 capsule (18 mcg total) into inhaler and inhale daily. 90 capsule 1  . TRAMADOL HCL 50 MG PO TABS  TAKE ONE TABLET BY MOUTH EVERY 6 HOURS AS NEEDED 90 tablet 1  . WARFARIN SODIUM 2 MG PO TABS Oral Take 2 mg by mouth daily. Tuesday, Thursday and Saturday     . WARFARIN SODIUM 5 MG PO TABS Oral Take 2.5 mg by mouth as directed. Takes on Monday, Wednesday, Friday and Sunday.      BP 126/82  Pulse 96  Temp(Src) 98 F (36.7 C) (Oral)  Resp 24  SpO2 94%  Physical Exam  Nursing note and vitals reviewed. Constitutional: He is oriented to person, place, and time. He appears well-developed and well-nourished. No distress.  HENT:  Head: Normocephalic and atraumatic.  Mouth/Throat: Oropharynx is clear and moist.  Eyes: Conjunctivae and EOM are normal. Pupils are equal, round, and reactive to light.  Neck: Normal range of motion. Neck supple.  Cardiovascular: Normal rate, regular rhythm and intact distal pulses.   No murmur heard. Pulmonary/Chest: Effort normal. No respiratory distress. He has decreased breath sounds. He has no wheezes. He has no rhonchi. He has rales in the right lower field and the left lower field.  Abdominal: Soft. He exhibits no distension. There is no tenderness. There is no rebound and no guarding.  Musculoskeletal: Normal range of motion. He exhibits edema. He exhibits no tenderness.  Neurological: He is alert and oriented to person, place, and time.  Skin: Skin is warm and dry. No rash  noted. No erythema.       Healing lesions over bilateral upper ext  Psychiatric: He has a normal mood and affect. His behavior is normal.    ED Course  Procedures (including critical care time)  Labs Reviewed  CBC - Abnormal; Notable for the following:    Hemoglobin 12.9 (*)    RDW 16.2 (*)    All other components within normal limits  DIFFERENTIAL - Abnormal; Notable for the following:    Neutrophils Relative 81 (*)    Neutro Abs 7.9 (*)    Lymphocytes Relative 7 (*)    All other  components within normal limits  BASIC METABOLIC PANEL - Abnormal; Notable for the following:    Potassium 3.4 (*)    Chloride 95 (*)    CO2 36 (*)    Glucose, Bld 54 (*)    BUN 30 (*)    Creatinine, Ser 1.73 (*)    GFR calc non Af Amer 40 (*)    GFR calc Af Amer 46 (*)    All other components within normal limits  CARDIAC PANEL(CRET KIN+CKTOT+MB+TROPI) - Abnormal; Notable for the following:    CK, MB 5.3 (*)    All other components within normal limits  PRO B NATRIURETIC PEPTIDE - Abnormal; Notable for the following:    BNP, POC 3567.0 (*)    All other components within normal limits  I-STAT TROPONIN I   Dg Chest 2 View  02/06/2011  *RADIOLOGY REPORT*  Clinical Data: Shortness of breath.  Previous myocardial infarct.  CHEST - 2 VIEW  Comparison: 12/24/2010  Findings: Moderate cardiomegaly stable.  New bibasilar interstitial infiltrates and small bilateral pleural effusions are seen, consistent with mild congestive heart failure.  Azygos fissure again noted.  IMPRESSION: Mild acute congestive heart failure with small bilateral pleural effusions.  Original Report Authenticated By: Danae Orleans, M.D.    Date: 02/06/2011  Rate: 98  Rhythm: atrial fibrillation  QRS Axis: normal  Intervals: normal  ST/T Wave abnormalities: nonspecific ST/T changes  Conduction Disutrbances:none  Narrative Interpretation:   Old EKG Reviewed: unchanged    No diagnosis found.    MDM   Pt with worsening SOB  over the last few weeks and today worse exertional dyspnea and some chest pain.  Denies any chest pain now and still SOB.  Wears 2L at all times and persistent cough but no sputum or fever currently.  Hx of CHF, COPD and stents in the past.  Exam with signs of fluid present and some rales in the lungs.  After albuterol/atrovent/prednisone some improvement in breathing however CXR with fluid overload, CBC, BMP wnl other than stable CRI and elevated BNP today of 3500.  Pt takes 40 of lasix at home and will give IV dose of 40 here.  I-stat troponin mildly elevated however regular enzymes neg except for slight elevation of CK-MB.  EKG unchanged.  Will admit for CHF exacerbation and cardiac r/o.        Gwyneth Sprout, MD 02/06/11 [phone]

## 2011-02-07 ENCOUNTER — Encounter (HOSPITAL_COMMUNITY): Payer: Self-pay | Admitting: Internal Medicine

## 2011-02-07 ENCOUNTER — Other Ambulatory Visit: Payer: Self-pay

## 2011-02-07 DIAGNOSIS — E162 Hypoglycemia, unspecified: Secondary | ICD-10-CM | POA: Diagnosis present

## 2011-02-07 DIAGNOSIS — E876 Hypokalemia: Secondary | ICD-10-CM | POA: Diagnosis present

## 2011-02-07 LAB — GLUCOSE, CAPILLARY
Glucose-Capillary: 371 mg/dL — ABNORMAL HIGH (ref 70–99)
Glucose-Capillary: 304 mg/dL — ABNORMAL HIGH (ref 70–99)

## 2011-02-07 LAB — PROTIME-INR
INR: 2.45 — ABNORMAL HIGH (ref 0.00–1.49)
Prothrombin Time: 27 seconds — ABNORMAL HIGH (ref 11.6–15.2)

## 2011-02-07 LAB — APTT: aPTT: 57 seconds — ABNORMAL HIGH (ref 24–37)

## 2011-02-07 LAB — CARDIAC PANEL(CRET KIN+CKTOT+MB+TROPI)
Relative Index: INVALID (ref 0.0–2.5)
Troponin I: <0.3 ng/mL (ref <0.30)

## 2011-02-07 MED ORDER — TEMAZEPAM 15 MG PO CAPS: 30.0000 mg | ORAL_CAPSULE | Freq: Every evening | ORAL | Status: DC | PRN

## 2011-02-07 MED ORDER — PREDNISONE 20 MG PO TABS
60.0000 mg | ORAL_TABLET | Freq: Every morning | ORAL | Status: AC
  Administered 2011-02-07 – 2011-02-09 (×3): 60 mg via ORAL
  Filled 2011-02-07 (×3): qty 3

## 2011-02-07 MED ORDER — WARFARIN SODIUM 2.5 MG PO TABS: 2.5000 mg | ORAL_TABLET | ORAL | Status: DC

## 2011-02-07 MED ORDER — POTASSIUM CHLORIDE CRYS ER 20 MEQ PO TBCR: 20.0000 meq | EXTENDED_RELEASE_TABLET | Freq: Once | ORAL | Status: DC

## 2011-02-07 MED ORDER — SODIUM CHLORIDE 0.9 % IV SOLN: 250.0000 mL | INTRAVENOUS | Status: DC

## 2011-02-07 MED ORDER — SODIUM CHLORIDE 0.9 % IJ SOLN: 3.0000 mL | INTRAMUSCULAR | Status: DC | PRN

## 2011-02-07 MED ORDER — EZETIMIBE-SIMVASTATIN 10-40 MG PO TABS
1.0000 | ORAL_TABLET | Freq: Every day | ORAL | Status: DC
  Administered 2011-02-07 – 2011-02-12 (×6): 1 via ORAL
  Filled 2011-02-07 (×8): qty 1

## 2011-02-07 MED ORDER — PREDNISONE 20 MG PO TABS
40.0000 mg | ORAL_TABLET | Freq: Every day | ORAL | Status: AC
  Administered 2011-02-10 – 2011-02-12 (×3): 40 mg via ORAL
  Filled 2011-02-07 (×3): qty 2

## 2011-02-07 MED ORDER — WARFARIN SODIUM 2 MG PO TABS
2.0000 mg | ORAL_TABLET | Freq: Once | ORAL | Status: AC
  Administered 2011-02-07: 2 mg via ORAL
  Filled 2011-02-07: qty 1

## 2011-02-07 MED ORDER — NITROGLYCERIN 0.4 MG SL SUBL: 0.4000 mg | SUBLINGUAL_TABLET | SUBLINGUAL | Status: DC | PRN

## 2011-02-07 MED ORDER — ZOLPIDEM TARTRATE 5 MG PO TABS
5.0000 mg | ORAL_TABLET | Freq: Every evening | ORAL | Status: DC | PRN
  Administered 2011-02-08 – 2011-02-13 (×2): 5 mg via ORAL
  Filled 2011-02-07 (×2): qty 1

## 2011-02-07 MED ORDER — INSULIN GLARGINE 100 UNIT/ML ~~LOC~~ SOLN
30.0000 [IU] | Freq: Every day | SUBCUTANEOUS | Status: DC
  Administered 2011-02-08 – 2011-02-12 (×5): 30 [IU] via SUBCUTANEOUS
  Filled 2011-02-07: qty 3

## 2011-02-07 MED ORDER — PANTOPRAZOLE SODIUM 40 MG PO TBEC
40.0000 mg | DELAYED_RELEASE_TABLET | Freq: Every day | ORAL | Status: DC
  Administered 2011-02-07 – 2011-02-13 (×7): 40 mg via ORAL
  Filled 2011-02-07 (×8): qty 1

## 2011-02-07 MED ORDER — IPRATROPIUM BROMIDE 0.02 % IN SOLN
0.5000 mg | Freq: Four times a day (QID) | RESPIRATORY_TRACT | Status: DC
  Administered 2011-02-07 – 2011-02-10 (×7): 0.5 mg via RESPIRATORY_TRACT
  Filled 2011-02-07 (×8): qty 2.5

## 2011-02-07 MED ORDER — LEVALBUTEROL HCL 0.63 MG/3ML IN NEBU
0.6300 mg | INHALATION_SOLUTION | RESPIRATORY_TRACT | Status: DC | PRN
  Filled 2011-02-07: qty 3

## 2011-02-07 MED ORDER — GUAIFENESIN ER 600 MG PO TB12
600.0000 mg | ORAL_TABLET | Freq: Two times a day (BID) | ORAL | Status: DC
  Administered 2011-02-07 – 2011-02-13 (×13): 600 mg via ORAL
  Filled 2011-02-07 (×16): qty 1

## 2011-02-07 MED ORDER — BIOTENE DRY MOUTH MT LIQD
15.0000 mL | Freq: Two times a day (BID) | OROMUCOSAL | Status: DC
  Administered 2011-02-08 – 2011-02-13 (×11): 15 mL via OROMUCOSAL

## 2011-02-07 MED ORDER — METOPROLOL SUCCINATE ER 50 MG PO TB24
50.0000 mg | ORAL_TABLET | Freq: Two times a day (BID) | ORAL | Status: DC
  Administered 2011-02-07 – 2011-02-13 (×13): 50 mg via ORAL
  Filled 2011-02-07 (×16): qty 1

## 2011-02-07 MED ORDER — ACETAMINOPHEN 325 MG PO TABS: 650.0000 mg | ORAL_TABLET | ORAL | Status: DC | PRN

## 2011-02-07 MED ORDER — FUROSEMIDE 10 MG/ML IJ SOLN
80.0000 mg | Freq: Two times a day (BID) | INTRAMUSCULAR | Status: DC
  Administered 2011-02-07 – 2011-02-11 (×10): 80 mg via INTRAVENOUS
  Filled 2011-02-07 (×12): qty 8

## 2011-02-07 MED ORDER — TRAMADOL HCL 50 MG PO TABS: 50.0000 mg | ORAL_TABLET | Freq: Four times a day (QID) | ORAL | Status: DC | PRN

## 2011-02-07 MED ORDER — WARFARIN SODIUM 2 MG PO TABS: 2.0000 mg | ORAL_TABLET | Freq: Every day | ORAL | Status: DC

## 2011-02-07 MED ORDER — ONDANSETRON HCL 4 MG/2ML IJ SOLN: 4.0000 mg | Freq: Four times a day (QID) | INTRAMUSCULAR | Status: DC | PRN

## 2011-02-07 MED ORDER — MOXIFLOXACIN HCL 400 MG PO TABS
400.0000 mg | ORAL_TABLET | Freq: Every day | ORAL | Status: DC
  Administered 2011-02-07 – 2011-02-13 (×7): 400 mg via ORAL
  Filled 2011-02-07 (×8): qty 1

## 2011-02-07 MED ORDER — INSULIN ASPART 100 UNIT/ML ~~LOC~~ SOLN
0.0000 [IU] | SUBCUTANEOUS | Status: DC
  Administered 2011-02-07: 9 [IU] via SUBCUTANEOUS
  Administered 2011-02-07: 5 [IU] via SUBCUTANEOUS
  Administered 2011-02-07 – 2011-02-08 (×3): 7 [IU] via SUBCUTANEOUS
  Administered 2011-02-08: 5 [IU] via SUBCUTANEOUS
  Administered 2011-02-08: 7 [IU] via SUBCUTANEOUS
  Administered 2011-02-08: 9 [IU] via SUBCUTANEOUS
  Administered 2011-02-08 – 2011-02-09 (×3): 5 [IU] via SUBCUTANEOUS
  Administered 2011-02-09: 9 [IU] via SUBCUTANEOUS
  Administered 2011-02-09: 3 [IU] via SUBCUTANEOUS
  Administered 2011-02-09: 5 [IU] via SUBCUTANEOUS
  Administered 2011-02-09: 9 [IU] via SUBCUTANEOUS
  Administered 2011-02-10 (×2): 5 [IU] via SUBCUTANEOUS
  Administered 2011-02-10: 2 [IU] via SUBCUTANEOUS
  Administered 2011-02-10: 3 [IU] via SUBCUTANEOUS
  Administered 2011-02-10: 2 [IU] via SUBCUTANEOUS
  Administered 2011-02-11: 1 [IU] via SUBCUTANEOUS
  Administered 2011-02-11 (×2): 5 [IU] via SUBCUTANEOUS
  Administered 2011-02-11: 3 [IU] via SUBCUTANEOUS
  Administered 2011-02-11: 5 [IU] via SUBCUTANEOUS
  Administered 2011-02-11: 1 [IU] via SUBCUTANEOUS
  Administered 2011-02-12: 7 [IU] via SUBCUTANEOUS
  Administered 2011-02-12 (×2): 9 [IU] via SUBCUTANEOUS
  Administered 2011-02-12 (×2): 3 [IU] via SUBCUTANEOUS
  Administered 2011-02-13 (×3): 2 [IU] via SUBCUTANEOUS
  Filled 2011-02-07: qty 3

## 2011-02-07 MED ORDER — SODIUM CHLORIDE 0.9 % IJ SOLN
3.0000 mL | Freq: Two times a day (BID) | INTRAMUSCULAR | Status: DC
  Administered 2011-02-07 – 2011-02-13 (×13): 3 mL via INTRAVENOUS

## 2011-02-07 MED ORDER — FLUTICASONE-SALMETEROL 250-50 MCG/DOSE IN AEPB
1.0000 | INHALATION_SPRAY | Freq: Two times a day (BID) | RESPIRATORY_TRACT | Status: DC
  Administered 2011-02-07 – 2011-02-13 (×12): 1 via RESPIRATORY_TRACT
  Filled 2011-02-07: qty 14

## 2011-02-07 MED ORDER — SIMETHICONE 80 MG PO CHEW
80.0000 mg | CHEWABLE_TABLET | Freq: Four times a day (QID) | ORAL | Status: DC | PRN
  Administered 2011-02-07 – 2011-02-08 (×2): 80 mg via ORAL
  Filled 2011-02-07 (×4): qty 1

## 2011-02-07 NOTE — H&P (Signed)
PCP:   [name], MD   Chief Complaint:  Shortness of breath, chest tightness  HPI: [name] is a 65 y.o. 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   Presented with  Shortness of breath it's been occurring for the past day. He was planning on going to church and took off his oxygen started to feel short of breath and presyncopal. At the same time the lights went off in his house, he went outside  without his oxygen to figure out what's going on once he came back he was severely short of breath presyncopal with chest tightness and chest pain.  His wife noticed that he was very pale. He decided to go in to emergency department to seek further medical help. Only alone he been feeling fatigued,  tight in his abdomen, tight in his chest and difficulty breathing. Recently he had been having increased cough productive of green sputum. No fevers or chills. He is currently chest pain-free but still feels somewhat short of breath. He states that lately his legs but more swollen than usual. He layed down in his legs up and this has helped.  Review of Systems:    Pertinent Positives: Chest pain, chest tightness, shortness of breath, cough productive of green sputum, increased abdominal girth increased leg swelling  Pertinent Negatives: No fever or chills no neurological abnormalities, no nausea no  vomiting no constipation Otherwise ROS are negative except for above, 10 systems were reviewed  Past Medical History: Past Medical History  Diagnosis Date  . ALCOHOL ABUSE, HX OF currently no longer drinks  04/30/2008  . Atrial fibrillation 12/17/2006  . Atrial flutter 09/20/2006  . 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 Chronic systolic heart failure 09/20/2006    previous EF 25%;  Echocardiogram 12/10/10: Mild-moderate LVH, EF 50-55%, mild MR, moderate LAE, moderate RAE, mild RVE with moderately reduced RV SF, PASP 50, small pericardial effusion  . COPD 12/17/2006  . CORONARY ARTERY DISEASE 09/20/2006    s/p stent to OM;  catheterization 12/06: Mid to distal LAD 30%, D2 20%, proximal circumflex 30%, OM 2 stents patent, ostial RCA 50%, mid RCA 40%, distal RCA multiple 40%  . 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 DYSPEPSIA&OTHER [facility] DISORDERS FUNCTION STOMACH 04/04/2009  . HYPERTENSION 12/17/2006  . OBESITY 04/30/2008  . HLD (hyperlipidemia) 09/20/2006  . OTITIS EXTERNA, CHRONIC NEC 12/28/2006  . PERSONAL HX COLONIC POLYPS 05/02/2009  . PSORIASIS 04/30/2008  . CKD (chronic kidney disease) 04/30/2008    creatinine 2.2 range   Past Surgical History  Procedure Date  . Cardiac defibrillator placement   . Angioplasty   . Hernia repair     bilat     Medications: Prior to Admission medications   Medication Sig Start Date End Date Taking? Authorizing Provider  albuterol (PROAIR HFA) 108 (90 BASE) MCG/ACT inhaler Inhale 2 puffs into the lungs every 6 (six) hours as needed.     Yes Historical Provider, MD  co-enzyme Q-10 30 MG capsule Take 30 mg by mouth daily.     Yes Historical Provider,  MD  ezetimibe-simvastatin (VYTORIN) 10-40 MG per tablet Take 1 tablet by mouth at bedtime.     Yes Historical Provider, MD  Fluticasone-Salmeterol (ADVAIR DISKUS) 250-50 MCG/DOSE AEPB Inhale 1 puff into the lungs every 12 (twelve) hours. 09/15/10  Yes Peter Charissa Bash  furosemide (LASIX) 40 MG tablet Take 80 mg by mouth 2 (two) times daily.   12/17/10 12/17/11 Yes Peter Charissa Bash  insulin glargine (LANTUS) 100 UNIT/ML injection 30 units at bedtime 01/08/11 01/08/12 Yes Peter Charissa Bash  insulin lispro (HUMALOG) 100 UNIT/ML injection Inject 20 units before each meal - if blood sugars more than 200 add 8 units. 01/21/11  Yes Peter Charissa Bash  metoprolol (TOPROL-XL) 50 MG 24 hr tablet Take 1 tablet (50 mg total) by mouth 2 (two) times daily. 12/24/10  Yes Beatrice Lecher, PA  Multiple Vitamin (MULTIVITAMIN) tablet Take 1 tablet by mouth daily.     Yes Historical Provider, MD  Phenyleph-Chlorphen-Hydrocod (HYDROCODONE-PE-CHLORPHENIRAMIN PO) Take 5 mLs by mouth every 12 (twelve) hours.     Yes Historical Provider, MD  RABEprazole (ACIPHEX) 20 MG tablet Take 20 mg by mouth daily.     Yes Historical Provider, MD  temazepam (RESTORIL) 30 MG capsule TAKE ONE CAPSULE BY MOUTH AT BEDTIME AS NEEDED FOR SLEEP 02/05/11  Yes [name]  tiotropium (SPIRIVA HANDIHALER) 18 MCG inhalation capsule Place 1 capsule (18 mcg total) into inhaler and inhale daily. 07/22/10 07/22/11 Yes Peter Charissa Bash  traMADol (ULTRAM) 50 MG tablet TAKE ONE TABLET BY MOUTH EVERY 6 HOURS AS NEEDED 10/28/10  Yes [name]  warfarin (COUMADIN) 2 MG tablet Take 2 mg by mouth daily. Tuesday, Thursday and Saturday  01/09/11  Yes Lewayne Bunting, MD  warfarin (COUMADIN) 5 MG tablet Take 2.5 mg by mouth as directed. Takes on Monday, Wednesday, Friday and Sunday. 04/28/10 04/28/11 Yes Peter Charissa Bash    Allergies:   Allergies  Allergen Reactions  . Aspirin     REACTION: Swelling, trouble breathing  . Tetracycline Hcl     REACTION: Swelling, trouble breathing    Social History:  reports that he quit smoking about 5 years ago. His smoking use included Cigarettes. He has a 50 pack-year smoking history. He has never used smokeless tobacco. He  reports that he does not drink alcohol or use illicit drugs.   Family History: family history includes Diabetes in his sister; Heart attack in his father; and Lung cancer in his mother.  There is no history of Colon cancer.    Physical Exam: Patient Vitals for the past 24 hrs:  BP Temp Temp src Pulse Resp SpO2  02/07/11 0130 136/94 mmHg - - 103  23  95 %  02/07/11 0115 121/63 mmHg - - 107  28  95 %  02/07/11 0100 120/67 mmHg - - 101  19  94 %  02/07/11 0045 124/67 mmHg - - 107  27  94 %  02/07/11 0030 142/82 mmHg - - 107  23  95 %  02/07/11 0015 - - - 57  24  94 %  02/07/11 0000 - - - - 23  -  02/06/11 2345 - - - - 24  -  02/06/11 2330 - - - 104  23  97 %  02/06/11 2315 - - - 74  27  95 %  02/06/11 2300 - - - 105  21  95 %  02/06/11 2245 - - - 112  - 96 %  02/06/11 2230 - - -  104  - 96 %  02/06/11 2215 - - - 103  - 95 %  02/06/11 2200 - - - 106  25  95 %  02/06/11 2145 - - - 100  23  96 %  02/06/11 2115 - - - 98  21  96 %  02/06/11 2100 - - - 96  22  97 %  02/06/11 2045 - - - 101  23  95 %  02/06/11 2000 126/82 mmHg 98 F (36.7 C) Oral 96  24  94 %     Alert and Oriented in No Acute distress Mucous Membranes and Skin: Moist mucous membranes normal skin turgor Head Non traumatic Heart: Irregular regular with somewhat rapid Lungs: Distant breath sounds no particular wheezes appreciated no crackles but decreased air movement noted Abdomen: Obese slightly distended nontender normal bowel sounds Lower extremities: 2+ edema bilaterally Neurologically Grossly intact: Moving all 4 extremities normal strength throughout Skin clean Dry and intact no rash:  body mass index is unknown because there is no height or weight on file.   Labs on Admission:   [facility] 02/06/11 2140  NA 139  K 3.4*  CL 95*  CO2 36*  GLUCOSE 54*  BUN 30*  CREATININE 1.73*  CALCIUM 9.3  MG --  PHOS --   No results found for this basename: AST:2,ALT:2,ALKPHOS:2,BILITOT:2,PROT:2,ALBUMIN:2 in the  last 72 hours No results found for this basename: LIPASE:2,AMYLASE:2 in the last 72 hours  Basename 02/06/11 2140  WBC 9.8  NEUTROABS 7.9*  HGB 12.9*  HCT 41.2  MCV 84.4  PLT 235    Basename 02/06/11 2140  CKTOTAL 97  CKMB 5.3*  CKMBINDEX --  TROPONINI <0.30   No results found for this basename: TSH,T4TOTAL,FREET3,T3FREE,THYROIDAB in the last 72 hours No results found for this basename: VITAMINB12:2,FOLATE:2,FERRITIN:2,TIBC:2,IRON:2,RETICCTPCT:2 in the last 72 hours Lab Results  Component Value Date   HGBA1C 10.8* 12/10/2010    The CrCl is unknown because both a height and weight (above a minimum accepted value) are required for this calculation. ABG    Component Value Date/Time   PHART 7.415 05/10/2009 0040   HCO3 30.2* 05/10/2009 0040   TCO2 33 05/10/2009 0054   ACIDBASEDEF 1.7 05/04/2009 0434   O2SAT 95.2 05/10/2009 0040     No results found for this basename: DDIMER     Other results: ED ECG REPORT  Rate: 98  Rhythm: atrial fib ST&T Change: No significant change from prior    Blood Culture    Component Value Date/Time   SDES BLOOD LEFT HAND 05/05/2009 1725   SPECREQUEST BOTTLES DRAWN AEROBIC AND ANAEROBIC 5.5CC 05/05/2009 1725   CULT NO GROWTH 5 DAYS 05/05/2009 1725   REPTSTATUS 05/12/2009 FINAL 05/05/2009 1725       Radiological Exams on Admission: Dg Chest 2 View  02/06/2011  *RADIOLOGY REPORT*  Clinical Data: Shortness of breath.  Previous myocardial infarct.  CHEST - 2 VIEW  Comparison: 12/24/2010  Findings: Moderate cardiomegaly stable.  New bibasilar interstitial infiltrates and small bilateral pleural effusions are seen, consistent with mild congestive heart failure.  Azygos fissure again noted.  IMPRESSION: Mild acute congestive heart failure with small bilateral pleural effusions.  Original Report Authenticated By: Danae Orleans, M.D.    Assessment/Plan  patient is a 65 year old gentleman with a shortness of breath and chest tightness likely  combination of COPD exacerbation versus CHF exacerbation   .COPD exacerbation -  - Will initiate prednisone taper, start on Avelox, Albuterol PRN, scheduled Atrovent, ADvair and Mucinex.  Titrate O2 to saturation >90%. Follow patients respiratory status.   .CHF exacerbation - give IV Lasix cycle cardiac enzymes he had already an echogram done in September supple not repeat, patient has chronic renal insufficiency we'll avoid ACE inhibitor he has allergies to aspirin  .Chest pain on exertion - monitor on telemetry, cycle cardiac enzymes, obtain serial ECG. Further risk stratify with lipid panel, hgA1C, obtain TSH. Marland Kitchen Further treatment based on the currently pending results.   .Atrial fibrillation - currently being rate controlled continue home meds  .DM w/o Complication Type II - no rectal sliding scale, hold Lantus for now until blood sugars have stabilized .HYPERTENSION - continue home meds  .CORONARY ARTERY DISEASE - cycle cardiac enzymes given recent chest pain  .RENAL INSUFFICIENCY - appears to be stable will watch while given Lasix  .Chronic combined systolic and diastolic congestive heart failure .Chronic respiratory failure - continue oxygen therapy  .Hypokalemia - replace as needed  .Hypoglycemia - watch glucose every 4 hours hold off on Lantus for today    Prophylaxis: patient is on Coumadin   CODE STATUS: patient wishes to BE full code    Zaylynn Rickett 02/07/2011, 2:58 AM

## 2011-02-07 NOTE — Progress Notes (Signed)
Cm spoke with pt cocncerning d/c planning. Md order for Cm consult concerning medication assistance. Pt ineligible for indigent funds. Pt given [facility] generic drug list. Pt advised to ask physician to send insulin pens to pharmacy to be relabled for home use upon discharge to assist with med cost. Pt is married. Has PCP whom he states assist with medication by giving samples.

## 2011-02-07 NOTE — Progress Notes (Signed)
ANTICOAGULATION CONSULT NOTE - Follow-up  Pharmacy Consult for warfarin Indication: atrial fibrillation  Allergies  Allergen Reactions  . Aspirin     REACTION: Swelling, trouble breathing  . Tetracycline Hcl     REACTION: Swelling, trouble breathing    Patient Measurements: Height: 5\' 6"  (167.6 cm) Weight: 188 lb 11.4 oz (85.6 kg) IBW/kg (Calculated) : 63.8  Adjusted Body Weight:   Vital Signs: Temp: 97.7 F (36.5 C) (11/17 1431) Temp src: Oral (11/17 1431) BP: 123/78 mmHg (11/17 1431) Pulse Rate: 97  (11/17 1431)  Labs:  Basename 02/07/11 1142 02/07/11 0730 02/06/11 2140  HGB -- -- 12.9*  HCT -- -- 41.2  PLT -- -- 235  APTT -- 57* --  LABPROT -- 27.0* --  INR -- 2.45* --  HEPARINUNFRC -- -- --  CREATININE -- -- 1.73*  CKTOTAL 82 72 97  CKMB 5.0* 5.0* 5.3*  TROPONINI <0.30 <0.30 <0.30   Estimated Creatinine Clearance: 44.2 ml/min (by C-G formula based on Cr of 1.73).  Medical History: Past Medical History  Diagnosis Date  . ALCOHOL ABUSE, HX OF 04/30/2008  . Atrial fibrillation 12/17/2006  . Atrial flutter 09/20/2006  . 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 Chronic systolic heart failure 09/20/2006    previous EF 25%;  Echocardiogram 12/10/10: Mild-moderate LVH, EF 50-55%, mild MR, moderate LAE, moderate RAE, mild RVE with moderately reduced RV SF, PASP 50, small pericardial effusion  . COPD 12/17/2006  . CORONARY ARTERY DISEASE 09/20/2006    s/p stent to OM;  catheterization 12/06: Mid to distal LAD 30%, D2 20%, proximal circumflex 30%, OM 2 stents patent, ostial RCA 50%, mid RCA 40%, distal RCA multiple 40%  . 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 DYSPEPSIA&OTHER [facility] DISORDERS FUNCTION STOMACH 04/04/2009  . HYPERTENSION 12/17/2006  . OBESITY 04/30/2008  . HLD (hyperlipidemia) 09/20/2006  . OTITIS EXTERNA, CHRONIC NEC 12/28/2006  . PERSONAL HX COLONIC POLYPS 05/02/2009  . PSORIASIS 04/30/2008  . CKD (chronic kidney  disease) 04/30/2008    creatinine 2.2 range     Assessment: Pt on chronic warfarin for Afib, INR therapeutic. Home dose of warfarin 2mg  on Tues/Thurs/Sat and 2.5mg  Mon/Wed/Fri/Sunday  Goal of Therapy:  INR 2-3   Plan:  Will continue home regimen as in assessment above - 2mg  tonight at 1800. Daily PT/INR  Berkley Harvey 02/07/2011,3:29 PM

## 2011-02-07 NOTE — Progress Notes (Signed)
I have seen and examined pt, will continue current management plan ad per Dr Adela Glimpse. Cardiac enzymes neg so far, pt states breathing better, will follow.

## 2011-02-07 NOTE — Consult Note (Signed)
ANTICOAGULATION CONSULT NOTE - Initial Consult  Pharmacy Consult for warfarin Indication: atrial fibrillation  Allergies  Allergen Reactions  . Aspirin     REACTION: Swelling, trouble breathing  . Tetracycline Hcl     REACTION: Swelling, trouble breathing    Patient Measurements: Weight: 188 lb 11.4 oz (85.6 kg) Adjusted Body Weight:   Vital Signs: Temp: 97.6 F (36.4 C) (11/17 0517) Temp src: Oral (11/17 0517) BP: 135/80 mmHg (11/17 0517) Pulse Rate: 102  (11/17 0517)  Labs:  Basename 02/06/11 2140  HGB 12.9*  HCT 41.2  PLT 235  APTT --  LABPROT --  INR --  HEPARINUNFRC --  CREATININE 1.73*  CKTOTAL 97  CKMB 5.3*  TROPONINI <0.30   The CrCl is unknown because both a height and weight (above a minimum accepted value) are required for this calculation.  Medical History: Past Medical History  Diagnosis Date  . ALCOHOL ABUSE, HX OF 04/30/2008  . Atrial fibrillation 12/17/2006  . Atrial flutter 09/20/2006  . 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 Chronic systolic heart failure 09/20/2006    previous EF 25%;  Echocardiogram 12/10/10: Mild-moderate LVH, EF 50-55%, mild MR, moderate LAE, moderate RAE, mild RVE with moderately reduced RV SF, PASP 50, small pericardial effusion  . COPD 12/17/2006  . CORONARY ARTERY DISEASE 09/20/2006    s/p stent to OM;  catheterization 12/06: Mid to distal LAD 30%, D2 20%, proximal circumflex 30%, OM 2 stents patent, ostial RCA 50%, mid RCA 40%, distal RCA multiple 40%  . 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 DYSPEPSIA&OTHER [facility] DISORDERS FUNCTION STOMACH 04/04/2009  . HYPERTENSION 12/17/2006  . OBESITY 04/30/2008  . HLD (hyperlipidemia) 09/20/2006  . OTITIS EXTERNA, CHRONIC NEC 12/28/2006  . PERSONAL HX COLONIC POLYPS 05/02/2009  . PSORIASIS 04/30/2008  . CKD (chronic kidney disease) 04/30/2008    creatinine 2.2 range     Assessment: Pt on chronic warfarin for Afib, INR ordered but not  reported.  Home dose of warfarin unclear from medications list in prior to admission meds  Goal of Therapy:  INR 2-3   Plan:  Daily PT/INR, will f/u with patient concerning home warfarin dose and provide dose if needed today.  Darlina Guys, Jacquenette Shone Crowford 02/07/2011,5:37 AM

## 2011-02-08 ENCOUNTER — Other Ambulatory Visit: Payer: Self-pay

## 2011-02-08 LAB — COMPREHENSIVE METABOLIC PANEL
ALT: 14 U/L (ref 0–53)
CO2: 30 mEq/L (ref 19–32)
Calcium: 8.8 mg/dL (ref 8.4–10.5)
Creatinine, Ser: 1.96 mg/dL — ABNORMAL HIGH (ref 0.50–1.35)
GFR calc Af Amer: 40 mL/min — ABNORMAL LOW (ref >90)
GFR calc non Af Amer: 34 mL/min — ABNORMAL LOW (ref >90)
Glucose, Bld: 337 mg/dL — ABNORMAL HIGH (ref 70–99)

## 2011-02-08 LAB — CBC
MCH: 26.2 pg (ref 26.0–34.0)
Platelets: 256 10*3/uL (ref 150–400)
RBC: 4.62 MIL/uL (ref 4.22–5.81)

## 2011-02-08 LAB — GLUCOSE, CAPILLARY
Glucose-Capillary: 292 mg/dL — ABNORMAL HIGH (ref 70–99)
Glucose-Capillary: 309 mg/dL — ABNORMAL HIGH (ref 70–99)
Glucose-Capillary: 313 mg/dL — ABNORMAL HIGH (ref 70–99)

## 2011-02-08 LAB — LIPID PANEL
Cholesterol: 120 mg/dL (ref 0–200)
LDL Cholesterol: 57 mg/dL (ref 0–99)
Total CHOL/HDL Ratio: 2.8 RATIO
VLDL: 20 mg/dL (ref 0–40)

## 2011-02-08 MED ORDER — SENNOSIDES-DOCUSATE SODIUM 8.6-50 MG PO TABS
1.0000 | ORAL_TABLET | Freq: Every day | ORAL | Status: DC | PRN
  Administered 2011-02-09 – 2011-02-11 (×2): 1 via ORAL
  Filled 2011-02-08 (×2): qty 1

## 2011-02-08 MED ORDER — WARFARIN SODIUM 2.5 MG PO TABS
2.5000 mg | ORAL_TABLET | Freq: Once | ORAL | Status: AC
  Administered 2011-02-08: 2.5 mg via ORAL
  Filled 2011-02-08: qty 1

## 2011-02-08 NOTE — Progress Notes (Signed)
Subjective: Still with intermittent shortness of breath with minimal activity, denies chest pain. Leg swelling unchanged . c/o constipation Objective: Vital signs in last 24 hours: Temp:  [97.7 F (36.5 C)-98.6 F (37 C)] 98.4 F (36.9 C) (11/18 1300) Pulse Rate:  [90-105] 100  (11/18 1300) Resp:  [18-19] 19  (11/18 1300) BP: (125-138)/(82-95) 134/95 mmHg (11/18 1300) SpO2:  [94 %-99 %] 99 % (11/18 1500) Weight:  [88 kg (194 lb 0.1 oz)] 194 lb 0.1 oz (88 kg) (11/18 0516) Last BM Date: 02/05/11 Intake/Output from previous day: 11/17 0701 - 11/18 0700 In: 840 [P.O.:840] Out: 2150 [Urine:2150] Intake/Output this shift: Total I/O In: 483 [P.O.:480; I.V.:3] Out: 600 [Urine:600]    General Appearance:    Alert, cooperative,   Lungs:    scattered coarse crackles in lower lung fields., respirations unlabored   Heart:    Regular rate and rhythm, S1 and S2 normal, no murmur, rub   or gallop  Abdomen:     Soft, non-tender, bowel sounds active all four quadrants,    no masses, no organomegaly  Extremities:   +2-3 edema  Neurologic:   CNII-XII intact, normal strength,      Weight change: 2.4 kg (5 lb 4.7 oz)  Intake/Output Summary (Last 24 hours) at 02/08/11 1658 Last data filed at 02/08/11 1500  Gross per 24 hour  Intake    843 ml  Output   2050 ml  Net  -1207 ml    Lab Results:   [facility] 02/08/11 0545 02/06/11 2140  NA 131* 139  K 4.1 3.4*  CL 91* 95*  CO2 30 36*  GLUCOSE 337* 54*  BUN 47* 30*  CREATININE 1.96* 1.73*  CALCIUM 8.8 9.3    Basename 02/08/11 0545 02/06/11 2140  WBC 13.1* 9.8  HGB 12.1* 12.9*  HCT 38.8* 41.2  PLT 256 235  MCV 84.0 84.4   PT/INR  Basename 02/08/11 0545 02/07/11 0730  LABPROT 28.8* 27.0*  INR 2.66* 2.45*   ABG No results found for this basename: PHART:2,PCO2:2,PO2:2,HCO3:2 in the last 72 hours  Micro Results: No results found for this or any previous visit (from the past 240 hour(s)). Studies/Results: Dg Chest 2  View  02/06/2011  *RADIOLOGY REPORT*  Clinical Data: Shortness of breath.  Previous myocardial infarct.  CHEST - 2 VIEW  Comparison: 12/24/2010  Findings: Moderate cardiomegaly stable.  New bibasilar interstitial infiltrates and small bilateral pleural effusions are seen, consistent with mild congestive heart failure.  Azygos fissure again noted.  IMPRESSION: Mild acute congestive heart failure with small bilateral pleural effusions.  Original Report Authenticated By: Danae Orleans, M.D.   Medications:  Scheduled Meds:   . antiseptic oral rinse  15 mL Mouth Rinse BID  . ezetimibe-simvastatin  1 tablet Oral QHS  . Fluticasone-Salmeterol  1 puff Inhalation BID  . furosemide  80 mg Intravenous BID  . guaiFENesin  600 mg Oral BID  . insulin aspart  0-9 Units Subcutaneous Q4H  . insulin glargine  30 Units Subcutaneous QHS  . ipratropium  0.5 mg Nebulization Q6H  . metoprolol  50 mg Oral BID  . moxifloxacin  400 mg Oral Daily  . pantoprazole  40 mg Oral Q1200  . potassium chloride SA  20 mEq Oral Once  . predniSONE  40 mg Oral QAC breakfast  . predniSONE  60 mg Oral AC breakfast  . sodium chloride  3 mL Intravenous Q12H  . warfarin  2 mg Oral ONCE-1800  . warfarin  2.5 mg  Oral ONCE-1800   Continuous Infusions:   . sodium chloride     PRN Meds:.acetaminophen, levalbuterol, nitroGLYCERIN, ondansetron (ZOFRAN) IV, simethicone, sodium chloride, traMADol, zolpidem Assessment/Plan:  COPD exacerbation - - continue prednisone follow and taper, we'll also continue antibiotics and bronchodilators including advair and Mucinex. Titrate O2 to saturation >90%. Follow patients respiratory status.  .CHF exacerbation - continue  IV Lasix, his creatinine is trending up but still below his baseline of about 2.2 per his records. Continue monitoring of renal function with diuresis. cardiac enzymes neg. holding off echo since he just had one done in September. Because of his chronic renal insufficiency we'll  avoid ACE inhibitor,  and avoiding aspirin due to allergy up the  .Chest pain on exertion - likely secondary to above, cardiac enzymes negative.  TSH. Marland Kitchen Further treatment based on the currently pending results.  .Atrial fibrillation - currently being rate controlled continue home meds  .DM w/o Complication Type II - no rectal sliding scale, hold Lantus for now until blood sugars have stabilized  .HYPERTENSION - continue home meds  .CORONARY ARTERY DISEASE - cardiac enzymes negative as above continue outpatient medications.  Marland KitchenRENAL INSUFFICIENCY - continue to monitor as above diuresis.  .Chronic combined systolic and diastolic congestive heart failure .Chronic respiratory failure - continue oxygen and management as above.  .Hypokalemia - resolved  .Hypoglycemia - resolved. .Constipation- prn senokot, follow      LOS: 2 days   Lorri Fukuhara C 02/08/2011, 4:58 PM

## 2011-02-08 NOTE — Progress Notes (Signed)
ANTICOAGULATION CONSULT NOTE - Follow-up  Pharmacy Consult for warfarin Indication: atrial fibrillation  Allergies  Allergen Reactions  . Aspirin     REACTION: Swelling, trouble breathing  . Tetracycline Hcl     REACTION: Swelling, trouble breathing    Patient Measurements: Height: 5\' 6"  (167.6 cm) Weight: 194 lb 0.1 oz (88 kg) IBW/kg (Calculated) : 63.8   Vital Signs: Temp: 98.4 F (36.9 C) (11/18 1300) Temp src: Oral (11/18 1300) BP: 134/95 mmHg (11/18 1300) Pulse Rate: 100  (11/18 1300)  Labs:  Basename 02/08/11 0545 02/07/11 1142 02/07/11 0730 02/06/11 2140  HGB 12.1* -- -- 12.9*  HCT 38.8* -- -- 41.2  PLT 256 -- -- 235  APTT -- -- 57* --  LABPROT 28.8* -- 27.0* --  INR 2.66* -- 2.45* --  HEPARINUNFRC -- -- -- --  CREATININE 1.96* -- -- 1.73*  CKTOTAL -- 82 72 97  CKMB -- 5.0* 5.0* 5.3*  TROPONINI -- <0.30 <0.30 <0.30   Estimated Creatinine Clearance: 39.6 ml/min (by C-G formula based on Cr of 1.96).  Medical History: Past Medical History  Diagnosis Date  . ALCOHOL ABUSE, HX OF 04/30/2008  . Atrial fibrillation 12/17/2006  . Atrial flutter 09/20/2006  . 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 Chronic systolic heart failure 09/20/2006    previous EF 25%;  Echocardiogram 12/10/10: Mild-moderate LVH, EF 50-55%, mild MR, moderate LAE, moderate RAE, mild RVE with moderately reduced RV SF, PASP 50, small pericardial effusion  . COPD 12/17/2006  . CORONARY ARTERY DISEASE 09/20/2006    s/p stent to OM;  catheterization 12/06: Mid to distal LAD 30%, D2 20%, proximal circumflex 30%, OM 2 stents patent, ostial RCA 50%, mid RCA 40%, distal RCA multiple 40%  . 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 DYSPEPSIA&OTHER [facility] DISORDERS FUNCTION STOMACH 04/04/2009  . HYPERTENSION 12/17/2006  . OBESITY 04/30/2008  . HLD (hyperlipidemia) 09/20/2006  . OTITIS EXTERNA, CHRONIC NEC 12/28/2006  . PERSONAL HX COLONIC POLYPS 05/02/2009  .  PSORIASIS 04/30/2008  . CKD (chronic kidney disease) 04/30/2008    creatinine 2.2 range     Assessment: INR therapeutic on home dose of warfarin of 2mg  PO on Tues, Thurs, Sat and 2.5mg  PO on Mon, Wed, Fri, Sunday.  Goal of Therapy:  INR 2-3   Plan:  Continue home dose.  Today's dose will be 2.5mg  PO x1 Daily PT/INR  Lynann Beaver PharmD  Pager [phone] 02/08/2011 3:44 PM

## 2011-02-09 DIAGNOSIS — R0989 Other specified symptoms and signs involving the circulatory and respiratory systems: Secondary | ICD-10-CM

## 2011-02-09 DIAGNOSIS — R0609 Other forms of dyspnea: Secondary | ICD-10-CM

## 2011-02-09 DIAGNOSIS — R079 Chest pain, unspecified: Secondary | ICD-10-CM

## 2011-02-09 LAB — BASIC METABOLIC PANEL
BUN: 59 mg/dL — ABNORMAL HIGH (ref 6–23)
Calcium: 8.8 mg/dL (ref 8.4–10.5)
Creatinine, Ser: 1.97 mg/dL — ABNORMAL HIGH (ref 0.50–1.35)
GFR calc Af Amer: 40 mL/min — ABNORMAL LOW (ref >90)
GFR calc non Af Amer: 34 mL/min — ABNORMAL LOW (ref >90)
Glucose, Bld: 407 mg/dL — ABNORMAL HIGH (ref 70–99)
Potassium: 4.3 mEq/L (ref 3.5–5.1)

## 2011-02-09 LAB — GLUCOSE, CAPILLARY
Glucose-Capillary: 276 mg/dL — ABNORMAL HIGH (ref 70–99)
Glucose-Capillary: 290 mg/dL — ABNORMAL HIGH (ref 70–99)
Glucose-Capillary: 371 mg/dL — ABNORMAL HIGH (ref 70–99)
Glucose-Capillary: 403 mg/dL — ABNORMAL HIGH (ref 70–99)
Glucose-Capillary: 409 mg/dL — ABNORMAL HIGH (ref 70–99)
Glucose-Capillary: 452 mg/dL — ABNORMAL HIGH (ref 70–99)

## 2011-02-09 LAB — POCT I-STAT TROPONIN I: Troponin i, poc: 0.09 ng/mL (ref 0.00–0.08)

## 2011-02-09 LAB — PROTIME-INR
INR: 2.64 — ABNORMAL HIGH (ref 0.00–1.49)
Prothrombin Time: 28.6 seconds — ABNORMAL HIGH (ref 11.6–15.2)

## 2011-02-09 MED ORDER — WARFARIN SODIUM 2.5 MG PO TABS
2.5000 mg | ORAL_TABLET | Freq: Once | ORAL | Status: AC
  Administered 2011-02-09: 2.5 mg via ORAL
  Filled 2011-02-09: qty 1

## 2011-02-09 MED ORDER — INSULIN ASPART 100 UNIT/ML ~~LOC~~ SOLN
12.0000 [IU] | Freq: Once | SUBCUTANEOUS | Status: AC
  Administered 2011-02-09: 12 [IU] via SUBCUTANEOUS

## 2011-02-09 MED ORDER — FUROSEMIDE 10 MG/ML IJ SOLN
80.0000 mg | Freq: Once | INTRAMUSCULAR | Status: AC
  Administered 2011-02-09: 80 mg via INTRAVENOUS
  Filled 2011-02-09: qty 8

## 2011-02-09 NOTE — Progress Notes (Signed)
Spoke with pt about home insulin regimen.  Per pt, he takes Lantus 30 units QHS (solostar pen) and Humalog 15-20 units tid with meals (Humalog Kwikpen).  Pt told me he has been getting help with his insulins through the MAP program of [facility].  Pt told me his primary MD, Dr. Amador Cunas, sends his information to the MAP program so that he can stay eligible for medication assistance.  Plans to continue getting his insulin pens through the MAP program.  Noted Care management left a note asking if pt could take his insulin pens home with him (the pens we have been using in the hospital for pt.)  Since pt has an ongoing Rx for Lantus solostar pen, he can take this pen home with him (needs to be relabeled for outpt use by pharmacy before d/c).  Since pt does not have an active Rx for Novolog, he will need a temporary Rx for the Novolog Flexpen if you would like him to take the Novolog pen home with him.  I spoke with pt about this, and he is interested in taking home both these pens at d/c.  Discussed the fact that Novolog is identical to Humalog and that he would take the Novolog the same way he takes his Humalog at home.  Once the Novolog pen runs out, he can switch back to his Humalog.  Pt told me he will be running out of CBG meter strips sometime at the start of the new year.  Told pt that if he cannot get help obtaining CBG meter strips through the MAP program, that he should consider buying a generic meter OTC at [facility].  The generic meter and strips OTC at [facility] are much more affordable out of pocket.    MD- May want to add Novolog meal coverage to pt's hospital regimen.  Recommend Novolog 4 units tid with meals to start, titrate as needed (pt takes large doses Humalog tid at home)    Will continue to follow and assist.

## 2011-02-09 NOTE — Progress Notes (Signed)
ANTICOAGULATION CONSULT NOTE - Follow-up  Pharmacy Consult for warfarin Indication: atrial fibrillation  Allergies  Allergen Reactions  . Aspirin     REACTION: Swelling, trouble breathing  . Tetracycline Hcl     REACTION: Swelling, trouble breathing    Patient Measurements: Height: 5\' 6"  (167.6 cm) Weight: 194 lb 0.1 oz (88 kg) IBW/kg (Calculated) : 63.8   Vital Signs: Temp: 97.8 F (36.6 C) (11/19 0550) Temp src: Oral (11/19 0550) BP: 128/86 mmHg (11/19 0900) Pulse Rate: 65  (11/19 0900)  Labs:  Basename 02/09/11 0455 02/08/11 0545 02/07/11 1142 02/07/11 0730 02/06/11 2140  HGB -- 12.1* -- -- 12.9*  HCT -- 38.8* -- -- 41.2  PLT -- 256 -- -- 235  APTT -- -- -- 57* --  LABPROT 28.6* 28.8* -- 27.0* --  INR 2.64* 2.66* -- 2.45* --  HEPARINUNFRC -- -- -- -- --  CREATININE 1.97* 1.96* -- -- 1.73*  CKTOTAL -- -- 82 72 97  CKMB -- -- 5.0* 5.0* 5.3*  TROPONINI -- -- <0.30 <0.30 <0.30   Estimated Creatinine Clearance: 39.4 ml/min (by C-G formula based on Cr of 1.97).  Medical History: Past Medical History  Diagnosis Date  . ALCOHOL ABUSE, HX OF 04/30/2008  . Atrial fibrillation 12/17/2006  . Atrial flutter 09/20/2006  . 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 Chronic systolic heart failure 09/20/2006    previous EF 25%;  Echocardiogram 12/10/10: Mild-moderate LVH, EF 50-55%, mild MR, moderate LAE, moderate RAE, mild RVE with moderately reduced RV SF, PASP 50, small pericardial effusion  . COPD 12/17/2006  . CORONARY ARTERY DISEASE 09/20/2006    s/p stent to OM;  catheterization 12/06: Mid to distal LAD 30%, D2 20%, proximal circumflex 30%, OM 2 stents patent, ostial RCA 50%, mid RCA 40%, distal RCA multiple 40%  . 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 DYSPEPSIA&OTHER [facility] DISORDERS FUNCTION STOMACH 04/04/2009  . HYPERTENSION 12/17/2006  . OBESITY 04/30/2008  . HLD (hyperlipidemia) 09/20/2006  . OTITIS EXTERNA, CHRONIC NEC  12/28/2006  . PERSONAL HX COLONIC POLYPS 05/02/2009  . PSORIASIS 04/30/2008  . CKD (chronic kidney disease) 04/30/2008    creatinine 2.2 range     Assessment: INR therapeutic on home dose of warfarin of 2mg  PO on Tues, Thurs, Sat and 2.5mg  PO on Mon, Wed, Fri, Sunday. This is day #3 Avelox (drug interaction: increases INR), but has not influenced INR yet.  Will watch this interaction closely.  Goal of Therapy:  INR 2-3   Plan:  Continue home dose.  Today's dose will be 2.5mg  PO x1 Daily PT/INR  [name] Boll 02/09/2011 11:24 AM

## 2011-02-09 NOTE — Progress Notes (Signed)
02/09/11 0531 NSG: Patient's CBG was 452 at 2400. Gaspar Skeeters NP on call for [facility]. 12 units insulin given. Rechecked was 403. NP paged and informed and no orders given. Told to recheck at 0400. At 0400 the patient's CBG was 409. 12 units of insulin given per Burnadette Peter NP with [facility]. Will cont. to monitor pt.

## 2011-02-09 NOTE — Progress Notes (Signed)
Subjective: States decreasing shortness of breath, denies chest pain. Leg swelling slightly better.  Objective: Vital signs in last 24 hours: Temp:  [97.4 F (36.3 C)-97.9 F (36.6 C)] 97.8 F (36.6 C) (11/19 0550) Pulse Rate:  [65-104] 65  (11/19 0900) Resp:  [16-18] 18  (11/19 0900) BP: (118-137)/(67-93) 128/86 mmHg (11/19 0900) SpO2:  [95 %-100 %] 98 % (11/19 0817) Last BM Date: 02/05/11 Intake/Output from previous day: 11/18 0701 - 11/19 0700 In: 723 [P.O.:720; I.V.:3] Out: 1500 [Urine:1500] Intake/Output this shift: Total I/O In: 240 [P.O.:240] Out: 925 [Urine:925]    General Appearance:    Alert, cooperative,   Lungs:    scattered coarse crackles in lower lung fields greater in right, respirations unlabored   Heart:    Regular rate and rhythm, S1 and S2 normal, no murmur, rub   or gallop  Abdomen:     Soft, non-tender, bowel sounds active all four quadrants,    no masses, no organomegaly  Extremities:   +2edema  Neurologic:   CNII-XII intact, normal strength,      Weight change:   Intake/Output Summary (Last 24 hours) at 02/09/11 1637 Last data filed at 02/09/11 1300  Gross per 24 hour  Intake    480 ml  Output   1825 ml  Net  -1345 ml    Lab Results:   Basename 02/09/11 0455 02/09/11 0118 02/08/11 0545  NA 133* -- 131*  K 4.3 -- 4.1  CL 93* -- 91*  CO2 33* -- 30  GLUCOSE 407* 451* --  BUN 59* -- 47*  CREATININE 1.97* -- 1.96*  CALCIUM 8.8 -- 8.8    Basename 02/08/11 0545 02/06/11 2140  WBC 13.1* 9.8  HGB 12.1* 12.9*  HCT 38.8* 41.2  PLT 256 235  MCV 84.0 84.4   PT/INR  Basename 02/09/11 0455 02/08/11 0545  LABPROT 28.6* 28.8*  INR 2.64* 2.66*   ABG No results found for this basename: PHART:2,PCO2:2,PO2:2,HCO3:2 in the last 72 hours  Micro Results: No results found for this or any previous visit (from the past 240 hour(s)). Studies/Results: No results found. Medications:  Scheduled Meds:    . antiseptic oral rinse  15 mL Mouth  Rinse BID  . ezetimibe-simvastatin  1 tablet Oral QHS  . Fluticasone-Salmeterol  1 puff Inhalation BID  . furosemide  80 mg Intravenous BID  . guaiFENesin  600 mg Oral BID  . insulin aspart  0-9 Units Subcutaneous Q4H  . insulin aspart  12 Units Subcutaneous Once  . insulin aspart  12 Units Subcutaneous Once  . insulin glargine  30 Units Subcutaneous QHS  . ipratropium  0.5 mg Nebulization Q6H  . metoprolol  50 mg Oral BID  . moxifloxacin  400 mg Oral Daily  . pantoprazole  40 mg Oral Q1200  . potassium chloride SA  20 mEq Oral Once  . predniSONE  40 mg Oral QAC breakfast  . predniSONE  60 mg Oral AC breakfast  . sodium chloride  3 mL Intravenous Q12H  . warfarin  2.5 mg Oral ONCE-1800  . warfarin  2.5 mg Oral ONCE-1800   Continuous Infusions:    . sodium chloride     PRN Meds:.acetaminophen, levalbuterol, nitroGLYCERIN, ondansetron (ZOFRAN) IV, senna-docusate, simethicone, sodium chloride, traMADol, zolpidem Assessment/Plan:  COPD exacerbation - - continue prednisone , we'll also continue antibiotics and bronchodilators including advair and Mucinex. Titrate O2 to saturation >90%. Follow patients respiratory status.  .CHF exacerbation - slowly improving on  IV Lasix, his creatinine is  stable. Continue monitoring of renal function with diuresis.  avoiding ACE inhibitor,  and avoiding aspirin due to allergy. I have consulted cardiology for further recommendations.   .Chest pain on exertion - likely secondary to above, cardiac enzymes negative.  TSH. Marland Kitchen Further treatment based on the currently pending results.  .Atrial fibrillation - currently being rate controlled continue home meds  .DM w/o Complication Type II - no rectal sliding scale, hold Lantus for now until blood sugars have stabilized  .HYPERTENSION - continue home meds  .CORONARY ARTERY DISEASE - cardiac enzymes negative as above continue outpatient medications.  Marland KitchenRENAL INSUFFICIENCY - continue to monitor as above with  diuresis.  .Chronic combined systolic and diastolic congestive heart failure .Chronic respiratory failure - continue oxygen and management as above.  .Hypokalemia - resolved  .Hypoglycemia - resolved. .Constipation- prn senokot, follow      LOS: 3 days   Dawt Reeb C 02/09/2011, 4:37 PM

## 2011-02-09 NOTE — Consult Note (Signed)
CARDIOLOGY CONSULT NOTE  Patient ID: [name] MRN: [id number], DOB/AGE: 08-15-1945   Admit date: 02/06/2011 Date of Consult: 02/09/2011   Primary Physician: [name], MD Primary Cardiologist: Lewayne Bunting  Pt. Profile:   65 y/o male w/ h/o CAD, COPD, CHF,and symptomatic a.fib, who presented with dyspnea and cp whom we've been asked to eval.  Problem List: Past Medical History  Diagnosis Date  . ALCOHOL ABUSE, HX OF 04/30/2008  . Atrial fibrillation 12/17/2006  . Atrial flutter 09/20/2006  . 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 Chronic systolic heart failure 09/20/2006    previous EF 25%;  Echocardiogram 12/10/10: Mild-moderate LVH, EF 50-55%, mild MR, moderate LAE, moderate RAE, mild RVE with moderately reduced RV SF, PASP 50, small pericardial effusion  . COPD 12/17/2006  . CORONARY ARTERY DISEASE 09/20/2006    s/p stent to OM;  catheterization 12/06: Mid to distal LAD 30%, D2 20%, proximal circumflex 30%, OM 2 stents patent, ostial RCA 50%, mid RCA 40%, distal RCA multiple 40%  . 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 DYSPEPSIA&OTHER [facility] DISORDERS FUNCTION STOMACH 04/04/2009  . HYPERTENSION 12/17/2006  . OBESITY 04/30/2008  . HLD (hyperlipidemia) 09/20/2006  . OTITIS EXTERNA, CHRONIC NEC 12/28/2006  . PERSONAL HX COLONIC POLYPS 05/02/2009  . PSORIASIS 04/30/2008  . CKD (chronic kidney disease) 04/30/2008    creatinine 2.2 range    Past Surgical History  Procedure Date  . Cardiac defibrillator placement   . Angioplasty   . Hernia repair     bilat  . Cardiac catheterization   . Coronary angioplasty     Stents placed     Allergies:  Allergies  Allergen Reactions  . Aspirin     REACTION: Swelling, trouble breathing  . Tetracycline Hcl     REACTION: Swelling, trouble breathing    HPI:   65 y/o male with above problem list.  He has chronic doe and edema.  He wears O2 @ home.  He was last seen by Dr.  [name] in Oct. Of this year @ which point possibility of AV Nodal ablation with bi-v pacer placement was discusses.  The pt was, and remains, fearful of such a procedure, but understands that it will allow for rate control, which then may improve his volume status.  Last Friday, he took his O2 off and walked to his kitchen and felt presyncopal and sob.  The power in his house then went off and he walked outside to reset the circuit breaker, which again caused sob and presyncope.  He put his O2 back on and his Ss did not improve so he presented to the [facility] ED.  Here, he was admitted by IM and has been treated for COPD with inhalers and po steroids along with po avelox as well as Lasix 80 IV Bid.  He has diuresed reasonably well and his renal fxn has been stable.  His weight is variable (he doesn't weigh himself @ home).  We've been asked to eval.  He has not had any chest pain.   Inpatient Medications:     . antiseptic oral rinse  15 mL Mouth Rinse BID  . ezetimibe-simvastatin  1 tablet Oral QHS  . Fluticasone-Salmeterol  1 puff Inhalation BID  . furosemide  80 mg Intravenous BID  . guaiFENesin  600 mg Oral BID  . insulin aspart  0-9 Units Subcutaneous Q4H  . insulin glargine  30 Units Subcutaneous QHS  . ipratropium  0.5 mg  Nebulization Q6H  . metoprolol  50 mg Oral BID  . moxifloxacin  400 mg Oral Daily  . pantoprazole  40 mg Oral Q1200  . predniSONE  40 mg Oral QAC breakfast  . predniSONE  60 mg Oral AC breakfast  . sodium chloride  3 mL Intravenous Q12H  . warfarin  2.5 mg Oral ONCE-1800  . warfarin  2.5 mg Oral ONCE-1800    Family History  Problem Relation Age of Onset  . Lung cancer Mother     and aunt  . Heart attack Father   . Diabetes Sister     and aunt  . Colon cancer Neg Hx      History   Social History  . Marital Status: Married    Spouse Name: N/A    Number of Children: 0  . Years of Education: N/A   Occupational History  . [employer]    Social History Main  Topics  . Smoking status: Former Smoker -- 1.0 packs/day for 50 years    Types: Cigarettes    Quit date: 03/23/2005  . Smokeless tobacco: Never Used  . Alcohol Use: No     quit drinking 2007  . Drug Use: No  . Sexually Active: Not on file   Other Topics Concern  . Not on file   Social History Narrative  . No narrative on file     Review of Systems: General: negative for chills, fever, night sweats or weight changes.  Cardiovascular: negative for chest pain, paroxysmal nocturnal dyspnea.  Notable for DOE, 4-5 pillow orthopnea and LEE. Dermatological: negative for rash Respiratory: negative for cough or wheezing Urologic: negative for hematuria Abdominal: negative for nausea, vomiting, diarrhea, bright red blood per rectum, melena, or hematemesis.  No early satiety.  Some bloating/increase abd girth. Neurologic: negative for visual changes, syncope, or dizziness All other systems reviewed and are otherwise negative except as noted above.  Physical Exam: Blood pressure 128/86, pulse 65, temperature 97.8 F (36.6 C), temperature source Oral, resp. rate 18, height 5\' 6"  (1.676 m), weight 194 lb 0.1 oz (88 kg), SpO2 98.00%.  General: Well developed, well nourished, in no acute distress. Head: Normocephalic, atraumatic, sclera non-icteric, no xanthomas, nares are without discharge.  Neck: Supple without bruits.  JVP difficult to assess. Lungs:  Resp regular and unlabored.  Coarse, diminished breath sounds bilat. Heart: IR, IR, tachy. no s3, s4, or murmurs. Abdomen: firm, non-tender, non-distended, BS + x 4.  Msk:  Strength and tone appears normal for age. Extremities: No clubbing, cyanosis. 3+ pitting edema to knees bilat.  DP/PT/Radials 2+ and equal bilaterally. Neuro: Alert and oriented X 3. Moves all extremities spontaneously. Psych: Normal affect.   Labs:   Results for orders placed during the hospital encounter of 02/06/11 (from the past 72 hour(s))  CBC     Status:  Abnormal   Collection Time   02/06/11  9:40 PM      Component Value Range Comment   WBC 9.8  4.0 - 10.5 (K/uL)    RBC 4.88  4.22 - 5.81 (MIL/uL)    Hemoglobin 12.9 (*) 13.0 - 17.0 (g/dL)    HCT 40.9  81.1 - 91.4 (%)    MCV 84.4  78.0 - 100.0 (fL)    MCH 26.4  26.0 - 34.0 (pg)    MCHC 31.3  30.0 - 36.0 (g/dL)    RDW 78.2 (*) 95.6 - 15.5 (%)    Platelets 235  150 - 400 (K/uL)   DIFFERENTIAL  Status: Abnormal   Collection Time   02/06/11  9:40 PM      Component Value Range Comment   Neutrophils Relative 81 (*) 43 - 77 (%)    Neutro Abs 7.9 (*) 1.7 - 7.7 (K/uL)    Lymphocytes Relative 7 (*) 12 - 46 (%)    Lymphs Abs 0.7  0.7 - 4.0 (K/uL)    Monocytes Relative 10  3 - 12 (%)    Monocytes Absolute 1.0  0.1 - 1.0 (K/uL)    Eosinophils Relative 1  0 - 5 (%)    Eosinophils Absolute 0.1  0.0 - 0.7 (K/uL)    Basophils Relative 1  0 - 1 (%)    Basophils Absolute 0.1  0.0 - 0.1 (K/uL)   BASIC METABOLIC PANEL     Status: Abnormal   Collection Time   02/06/11  9:40 PM      Component Value Range Comment   Sodium 139  135 - 145 (mEq/L)    Potassium 3.4 (*) 3.5 - 5.1 (mEq/L)    Chloride 95 (*) 96 - 112 (mEq/L)    CO2 36 (*) 19 - 32 (mEq/L)    Glucose, Bld 54 (*) 70 - 99 (mg/dL)    BUN 30 (*) 6 - 23 (mg/dL)    Creatinine, Ser 4.09 (*) 0.50 - 1.35 (mg/dL)    Calcium 9.3  8.4 - 10.5 (mg/dL)    GFR calc non Af Amer 40 (*) >90 (mL/min)    GFR calc Af Amer 46 (*) >90 (mL/min)   CARDIAC PANEL(CRET KIN+CKTOT+MB+TROPI)     Status: Abnormal   Collection Time   02/06/11  9:40 PM      Component Value Range Comment   Total CK 97  7 - 232 (U/L)    CK, MB 5.3 (*) 0.3 - 4.0 (ng/mL)    Troponin I <0.30  <0.30 (ng/mL)    Relative Index RELATIVE INDEX IS INVALID  0.0 - 2.5    PRO B NATRIURETIC PEPTIDE     Status: Abnormal   Collection Time   02/06/11  9:40 PM      Component Value Range Comment   BNP, POC 3567.0 (*) 0 - 125 (pg/mL)       Component Value Range Comment   Total CK 72  7 - 232 (U/L)     CK, MB 5.0 (*) 0.3 - 4.0 (ng/mL)    Troponin I <0.30  <0.30 (ng/mL)    Relative Index RELATIVE INDEX IS INVALID  0.0 - 2.5    PROTIME-INR     Status: Abnormal   Collection Time   02/07/11  7:30 AM      Component Value Range Comment   Prothrombin Time 27.0 (*) 11.6 - 15.2 (seconds)    INR 2.45 (*) 0.00 - 1.49    APTT     Status: Abnormal   Collection Time   02/07/11  7:30 AM      Component Value Range Comment   aPTT 57 (*) 24 - 37 (seconds)   TSH     Status: Normal   Collection Time   02/07/11  7:30 AM      Component Value Range Comment   TSH 3.180  0.350 - 4.500 (uIU/mL)   HEMOGLOBIN A1C     Status: Abnormal   Collection Time   02/07/11  7:30 AM      Component Value Range Comment   Hemoglobin A1C 9.6 (*) <5.7 (%)    Mean Plasma Glucose 229 (*) <  117 (mg/dL)       Component Value Range Comment   Glucose-Capillary 292 (*) 70 - 99 (mg/dL)   CARDIAC PANEL(CRET KIN+CKTOT+MB+TROPI)     Status: Abnormal   Collection Time   02/07/11 11:42 AM      Component Value Range Comment   Total CK 82  7 - 232 (U/L)    CK, MB 5.0 (*) 0.3 - 4.0 (ng/mL)    Troponin I <0.30  <0.30 (ng/mL)    Relative Index RELATIVE INDEX IS INVALID  0.0 - 2.5    PRO B NATRIURETIC PEPTIDE     Status: Abnormal   Collection Time   02/07/11 11:42 AM      Component Value Range Comment   BNP, POC 4365.0 (*) 0 - 125 (pg/mL)   LIPID PANEL     Status: Normal   Collection Time   02/08/11  5:45 AM      Component Value Range Comment   Cholesterol 120  0 - 200 (mg/dL)    Triglycerides 99  <161 (mg/dL)    HDL 43  >09 (mg/dL)    Total CHOL/HDL Ratio 2.8      VLDL 20  0 - 40 (mg/dL)    LDL Cholesterol 57  0 - 99 (mg/dL)   CBC     Status: Abnormal   Collection Time   02/08/11  5:45 AM      Component Value Range Comment   WBC 13.1 (*) 4.0 - 10.5 (K/uL)    RBC 4.62  4.22 - 5.81 (MIL/uL)    Hemoglobin 12.1 (*) 13.0 - 17.0 (g/dL)    HCT 60.4 (*) 54.0 - 52.0 (%)    MCV 84.0  78.0 - 100.0 (fL)    MCH 26.2  26.0 - 34.0  (pg)    MCHC 31.2  30.0 - 36.0 (g/dL)    RDW 98.1 (*) 19.1 - 15.5 (%)    Platelets 256  150 - 400 (K/uL)   COMPREHENSIVE METABOLIC PANEL     Status: Abnormal   Collection Time   02/08/11  5:45 AM      Component Value Range Comment   Sodium 131 (*) 135 - 145 (mEq/L)    Potassium 4.1  3.5 - 5.1 (mEq/L)    Chloride 91 (*) 96 - 112 (mEq/L)    CO2 30  19 - 32 (mEq/L)    Glucose, Bld 337 (*) 70 - 99 (mg/dL)    BUN 47 (*) 6 - 23 (mg/dL)    Creatinine, Ser 4.78 (*) 0.50 - 1.35 (mg/dL)    Calcium 8.8  8.4 - 10.5 (mg/dL)    Total Protein 7.4  6.0 - 8.3 (g/dL)    Albumin 2.9 (*) 3.5 - 5.2 (g/dL)    AST 16  0 - 37 (U/L)    ALT 14  0 - 53 (U/L)    Alkaline Phosphatase 71  39 - 117 (U/L)    Total Bilirubin 0.6  0.3 - 1.2 (mg/dL)    GFR calc non Af Amer 34 (*) >90 (mL/min)    GFR calc Af Amer 40 (*) >90 (mL/min)   PROTIME-INR     Status: Abnormal   Collection Time   02/08/11  5:45 AM      Component Value Range Comment   Prothrombin Time 28.8 (*) 11.6 - 15.2 (seconds)    INR 2.66 (*) 0.00 - 1.49    PROTIME-INR     Status: Abnormal   Collection Time   02/09/11  4:55 AM  Component Value Range Comment   Prothrombin Time 28.6 (*) 11.6 - 15.2 (seconds) RESULT CHECKED   INR 2.64 (*) 0.00 - 1.49    BASIC METABOLIC PANEL     Status: Abnormal   Collection Time   02/09/11  4:55 AM      Component Value Range Comment   Sodium 133 (*) 135 - 145 (mEq/L)    Potassium 4.3  3.5 - 5.1 (mEq/L)    Chloride 93 (*) 96 - 112 (mEq/L)    CO2 33 (*) 19 - 32 (mEq/L)    Glucose, Bld 407 (*) 70 - 99 (mg/dL)    BUN 59 (*) 6 - 23 (mg/dL)    Creatinine, Ser 6.57 (*) 0.50 - 1.35 (mg/dL)    Calcium 8.8  8.4 - 10.5 (mg/dL)    GFR calc non Af Amer 34 (*) >90 (mL/min)    GFR calc Af Amer 40 (*) >90 (mL/min)   PRO B NATRIURETIC PEPTIDE     Status: Abnormal   Collection Time   02/09/11  4:55 AM      Component Value Range Comment   BNP, POC 7290.0 (*) 0 - 125 (pg/mL)     Radiology/Studies: Dg Chest 2  View  02/06/2011  *RADIOLOGY REPORT*  Clinical Data: Shortness of breath.  Previous myocardial infarct.  CHEST - 2 VIEW  Comparison: 12/24/2010  Findings: Moderate cardiomegaly stable.  New bibasilar interstitial infiltrates and small bilateral pleural effusions are seen, consistent with mild congestive heart failure.  Azygos fissure again noted.  IMPRESSION: Mild acute congestive heart failure with small bilateral pleural effusions.  Original Report Authenticated By: Danae Orleans, M.D.    EKG: Coarse A.Fib, 90, rbbb, no acute changes.  Tele:  AFib 90's to low 100's.  ASSESSMENT AND PLAN:   1.  Acute on Chronic mixed systolic and diastolic CHF:  Pt w/ marked volume overload and progressive dyspnea (in the setting of severe COPD).  Agree with IV Lasix.  Will give an additional 80mg  IV x 1 tonight (total of 160mg  this pm) and f/u creat in am.  If creat stable, will push lasix further @ that point.  Suspect that afib and rate are contributing significantly to pts Ss and volume excess.  He has discussed the possibility of avn ablation with bi-v pacer placement with Dr. [name] last month.  He is still not sure if he wants to pursue this procedure.  He will cont to consider and talk with his wife and in the mean time we will cont to tune him up.  If he decides on pursuing pacer/ablation, this could be done as an outpt.  2.  COPD exacerbation:  Per IM.  3.  AFib:  See #1.  Cont rate control.  4.  CAD:  No c/p.   Signed, Nicolasa Ducking, NP 02/09/2011, 4:18 PM  Patient seen and examined.  Agree with findings of C. Brion Aliment.   Patient a 65 year old with a history of CAD, CHF and COPD.  Admitted with SOB and chest tightness.  Now being treated for COPD exacerbation as well as CHF exacerbation  Agree with diuressis with IV lasix.  I would recomm increased lasix and following response.  Ventricular rates for afib are not excessive.  Follow.  Ultimately, will need to discuss/review option for AV  nodal ablation and BiV pacing.  Patient seems more amenable to these discussions.

## 2011-02-10 ENCOUNTER — Other Ambulatory Visit: Payer: Self-pay

## 2011-02-10 DIAGNOSIS — I509 Heart failure, unspecified: Secondary | ICD-10-CM

## 2011-02-10 LAB — PRO B NATRIURETIC PEPTIDE: Pro B Natriuretic peptide (BNP): 7936 pg/mL — ABNORMAL HIGH (ref 0–125)

## 2011-02-10 LAB — GLUCOSE, CAPILLARY
Glucose-Capillary: 187 mg/dL — ABNORMAL HIGH (ref 70–99)
Glucose-Capillary: 258 mg/dL — ABNORMAL HIGH (ref 70–99)
Glucose-Capillary: 288 mg/dL — ABNORMAL HIGH (ref 70–99)

## 2011-02-10 MED ORDER — IPRATROPIUM BROMIDE 0.02 % IN SOLN
0.5000 mg | Freq: Three times a day (TID) | RESPIRATORY_TRACT | Status: DC
  Administered 2011-02-10 – 2011-02-13 (×8): 0.5 mg via RESPIRATORY_TRACT
  Filled 2011-02-10 (×11): qty 2.5

## 2011-02-10 MED ORDER — WARFARIN SODIUM 2 MG PO TABS
2.0000 mg | ORAL_TABLET | Freq: Once | ORAL | Status: AC
  Administered 2011-02-10: 2 mg via ORAL
  Filled 2011-02-10: qty 1

## 2011-02-10 MED ORDER — IPRATROPIUM BROMIDE 0.02 % IN SOLN: 0.5000 mg | RESPIRATORY_TRACT | Status: DC | PRN

## 2011-02-10 MED ORDER — LEVALBUTEROL HCL 0.63 MG/3ML IN NEBU
0.6300 mg | INHALATION_SOLUTION | Freq: Four times a day (QID) | RESPIRATORY_TRACT | Status: DC | PRN
  Filled 2011-02-10: qty 3

## 2011-02-10 MED ORDER — LEVALBUTEROL HCL 0.63 MG/3ML IN NEBU
0.6300 mg | INHALATION_SOLUTION | Freq: Three times a day (TID) | RESPIRATORY_TRACT | Status: DC
  Administered 2011-02-10 – 2011-02-13 (×8): 0.63 mg via RESPIRATORY_TRACT
  Filled 2011-02-10 (×14): qty 3

## 2011-02-10 MED ORDER — LEVALBUTEROL TARTRATE 45 MCG/ACT IN AERO
1.0000 | INHALATION_SPRAY | Freq: Four times a day (QID) | RESPIRATORY_TRACT | Status: DC | PRN
  Filled 2011-02-10: qty 15

## 2011-02-10 NOTE — Progress Notes (Signed)
Inpatient Diabetes Program Recommendations  AACE/ADA: New Consensus Statement on Inpatient Glycemic Control (2009)  Target Ranges:  Prepandial:   less than 140 mg/dL      Peak postprandial:   less than 180 mg/dL (1-2 hours)      Critically ill patients:  140 - 180 mg/dL   Reason for Visit:  CBGs 11/19: 452/ 409/ 290/ 210/ 276/ 371 mg/dl CBGs today: 119/ 147/ 829 mg/dl PO Intake: 562% meals Takes large doses of Novolog at home (15-20 units tid with meals)  Inpatient Diabetes Program Recommendations Insulin - Basal: Please increase Lantus to 35 units QHS Insulin - Meal Coverage: Please add Novolog 4 units tid with meals  Note:

## 2011-02-10 NOTE — Progress Notes (Signed)
ANTICOAGULATION CONSULT NOTE - Follow-up  Pharmacy Consult for warfarin Indication: atrial fibrillation  Allergies  Allergen Reactions  . Aspirin     REACTION: Swelling, trouble breathing  . Tetracycline Hcl     REACTION: Swelling, trouble breathing    Patient Measurements: Height: 5\' 6"  (167.6 cm) Weight: 193 lb 12.8 oz (87.907 kg) (standing scales) IBW/kg (Calculated) : 63.8   Vital Signs: Temp: 97.5 F (36.4 C) (11/20 0511) Temp src: Oral (11/20 0511) BP: 137/93 mmHg (11/20 0839) Pulse Rate: 98  (11/20 0839)  Labs:  Basename 02/10/11 0455 02/09/11 0455 02/08/11 0545 02/07/11 1142  HGB -- -- 12.1* --  HCT -- -- 38.8* --  PLT -- -- 256 --  APTT -- -- -- --  LABPROT 28.5* 28.6* 28.8* --  INR 2.63* 2.64* 2.66* --  HEPARINUNFRC -- -- -- --  CREATININE -- 1.97* 1.96* --  CKTOTAL -- -- -- 82  CKMB -- -- -- 5.0*  TROPONINI -- -- -- <0.30   Estimated Creatinine Clearance: 39.3 ml/min (by C-G formula based on Cr of 1.97).  Medical History: Past Medical History  Diagnosis Date  . ALCOHOL ABUSE, HX OF 04/30/2008  . Atrial fibrillation 12/17/2006  . Atrial flutter 09/20/2006  . 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 Chronic systolic heart failure 09/20/2006    previous EF 25%;  Echocardiogram 12/10/10: Mild-moderate LVH, EF 50-55%, mild MR, moderate LAE, moderate RAE, mild RVE with moderately reduced RV SF, PASP 50, small pericardial effusion  . COPD 12/17/2006  . CORONARY ARTERY DISEASE 09/20/2006    s/p stent to OM;  catheterization 12/06: Mid to distal LAD 30%, D2 20%, proximal circumflex 30%, OM 2 stents patent, ostial RCA 50%, mid RCA 40%, distal RCA multiple 40%  . 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 DYSPEPSIA&OTHER [facility] DISORDERS FUNCTION STOMACH 04/04/2009  . HYPERTENSION 12/17/2006  . OBESITY 04/30/2008  . HLD (hyperlipidemia) 09/20/2006  . OTITIS EXTERNA, CHRONIC NEC 12/28/2006  . PERSONAL HX COLONIC POLYPS 05/02/2009   . PSORIASIS 04/30/2008  . CKD (chronic kidney disease) 04/30/2008    creatinine 2.2 range     Assessment: INR therapeutic on home dose of warfarin of 2mg  PO on Tues, Thurs, Sat and 2.5mg  PO on Mon, Wed, Fri, Sunday. This is day #4 Avelox (drug interaction: increases INR), but has not influenced INR yet.  Will watch this interaction closely. No bleeding/complications reported.  Goal of Therapy:  INR 2-3   Plan:  Continue home dose.  Today's dose will be 2mg  PO x1 Daily PT/INR  [name] Boll 02/10/2011 8:58 AM

## 2011-02-10 NOTE — Progress Notes (Signed)
Subjective: Still SOB with min activity though gradually feeling better. Denies cp. Objective: Vital signs in last 24 hours: Temp:  [97.5 F (36.4 C)-97.8 F (36.6 C)] 97.7 F (36.5 C) (11/20 1455) Pulse Rate:  [86-98] 89  (11/20 1455) Resp:  [18-20] 20  (11/20 1455) BP: (119-152)/(77-93) 152/77 mmHg (11/20 1455) SpO2:  [94 %-100 %] 100 % (11/20 1455) Weight:  [87.907 kg (193 lb 12.8 oz)] 193 lb 12.8 oz (87.907 kg) (11/20 0511) Last BM Date: 02/05/11 Intake/Output from previous day: 11/19 0701 - 11/20 0700 In: 343 [P.O.:340; I.V.:3] Out: 4050 [Urine:4050] Intake/Output this shift: Total I/O In: 240 [P.O.:240] Out: 650 [Urine:650]    General Appearance:    Alert, cooperative, in NAD  Lungs:     crackles in lower lung fields bil., respirations unlabored   Heart:    Irregularly irreg, rate controlled, S1 and S2 normal, no murmur, rub   or gallop  Abdomen:     Soft, non-tender, bowel sounds active all four quadrants,   Extremities:   +2edema, no cyanosis  Neurologic:   CNII-XII intact, normal strength,      Weight change:   Intake/Output Summary (Last 24 hours) at 02/10/11 1630 Last data filed at 02/10/11 1300  Gross per 24 hour  Intake    343 ml  Output   3775 ml  Net  -3432 ml    Lab Results:   Basename 02/09/11 0455 02/09/11 0118 02/08/11 0545  NA 133* -- 131*  K 4.3 -- 4.1  CL 93* -- 91*  CO2 33* -- 30  GLUCOSE 407* 451* --  BUN 59* -- 47*  CREATININE 1.97* -- 1.96*  CALCIUM 8.8 -- 8.8    Basename 02/08/11 0545  WBC 13.1*  HGB 12.1*  HCT 38.8*  PLT 256  MCV 84.0   PT/INR  Basename 02/10/11 0455 02/09/11 0455  LABPROT 28.5* 28.6*  INR 2.63* 2.64*   ABG No results found for this basename: PHART:2,PCO2:2,PO2:2,HCO3:2 in the last 72 hours  Micro Results: No results found for this or any previous visit (from the past 240 hour(s)). Studies/Results: No results found. Medications:  Scheduled Meds:    . antiseptic oral rinse  15 mL Mouth Rinse  BID  . ezetimibe-simvastatin  1 tablet Oral QHS  . Fluticasone-Salmeterol  1 puff Inhalation BID  . furosemide  80 mg Intravenous BID  . furosemide  80 mg Intravenous Once  . guaiFENesin  600 mg Oral BID  . insulin aspart  0-9 Units Subcutaneous Q4H  . insulin glargine  30 Units Subcutaneous QHS  . ipratropium  0.5 mg Nebulization TID  . levalbuterol  0.63 mg Nebulization TID  . metoprolol  50 mg Oral BID  . moxifloxacin  400 mg Oral Daily  . pantoprazole  40 mg Oral Q1200  . potassium chloride SA  20 mEq Oral Once  . predniSONE  40 mg Oral QAC breakfast  . sodium chloride  3 mL Intravenous Q12H  . warfarin  2 mg Oral ONCE-1800  . warfarin  2.5 mg Oral ONCE-1800  . DISCONTD: ipratropium  0.5 mg Nebulization Q6H   Continuous Infusions:    . sodium chloride     PRN Meds:.acetaminophen, levalbuterol, levalbuterol, nitroGLYCERIN, ondansetron (ZOFRAN) IV, senna-docusate, simethicone, sodium chloride, traMADol, zolpidem, DISCONTD: ipratropium, DISCONTD: levalbuterol Assessment/Plan:  COPD exacerbation - - continue prednisone, antibiotics and bronchodilators, gradually improving. folllow and continue tapering prednisone.  .CHF exacerbation - appreciate cardiology inputon  IV Lasix and diuresing well, his creatinine is stable. Continue monitoring  of renal function with diuresis.  avoiding ACE inhibitor,  and avoiding aspirin due to allergy.   .Chest pain on exertion - likely secondary to above, cardiac enzymes negative. .Atrial fibrillation - currently being rate controlled continue home meds. He states he is still deciding on bi-v pacer recommended per cards- follow.TSH is wnl.  .DM w/o Complication Type II - no rectal sliding scale, hold Lantus for now until blood sugars have stabilized  .HYPERTENSION - continue home meds  .CORONARY ARTERY DISEASE - above continue outpatient medications,cards following. Marland KitchenRENAL INSUFFICIENCY - recheck in am,continue to monitor as above with diuresis.    .Chronic combined systolic and diastolic congestive heart failure .Chronic respiratory failure - continue oxygen and management as above.  .Hypokalemia - resolved  .Hypoglycemia - resolved. .Constipation- on prn senokot      LOS: 4 days   Blasa Raisch C 02/10/2011, 4:30 PM

## 2011-02-10 NOTE — Progress Notes (Signed)
Patient ID: DEL OVERFELT, male   DOB: Dec 12, 1945, 65 y.o.   MRN: [id number] @ Subjective:  Denies SSCP, palpitations or Dyspnea Tingling in feet  Objective:  Vital Signs in the last 24 hours:       Wt Readings from Last 1 Encounters:  02/10/11 87.907 kg (193 lb 12.8 oz)    Temp:  [97.5 F (36.4 C)-97.8 F (36.6 C)] 97.5 F (36.4 C) (11/20 0511) Pulse Rate:  [65-94] 86  (11/20 0511) Resp:  [18-20] 18  (11/20 0511) BP: (119-148)/(81-89) 132/89 mmHg (11/20 0511) SpO2:  [96 %-100 %] 98 % (11/20 0511) Weight:  [87.907 kg (193 lb 12.8 oz)] 193 lb 12.8 oz (87.907 kg) (11/20 0511)  Intake/Output from previous day: 11/19 0701 - 11/20 0700 In: 343 [P.O.:340; I.V.:3] Out: 4050 [Urine:4050] Intake/Output from this shift:    Physical Exam: General appearance: alert and no distress Neck: JVD - 10 cm above sternal notch, no adenopathy, no carotid bruit, no JVD, supple, symmetrical, trachea midline and thyroid not enlarged, symmetric, no tenderness/mass/nodules Lungs: Decreased breath sounds and crackles both bases Heart: irregular rate and rhythm S1/S2 no murmur Extremities: edema Plus two bilateral Pulses: 2+ and symmetric  Lab Results:  [facility] 02/08/11 0545  WBC 13.1*  HGB 12.1*  PLT 256    Basename 02/09/11 0455 02/09/11 0118 02/08/11 0545  NA 133* -- 131*  K 4.3 -- 4.1  CL 93* -- 91*  CO2 33* -- 30  GLUCOSE 407* 451* --  BUN 59* -- 47*  CREATININE 1.97* -- 1.96*    Basename 02/07/11 1142 02/07/11 0730  TROPONINI <0.30 <0.30   Hepatic Function Panel  Basename 02/08/11 0545  PROT 7.4  ALBUMIN 2.9*  AST 16  ALT 14  ALKPHOS 71  BILITOT 0.6  BILIDIR --  IBILI --    Basename 02/08/11 0545  CHOL 120    Medications:     . antiseptic oral rinse  15 mL Mouth Rinse BID  . ezetimibe-simvastatin  1 tablet Oral QHS  . Fluticasone-Salmeterol  1 puff Inhalation BID  . furosemide  80 mg Intravenous BID  . furosemide  80 mg Intravenous Once  . guaiFENesin  600  mg Oral BID  . insulin aspart  0-9 Units Subcutaneous Q4H  . insulin glargine  30 Units Subcutaneous QHS  . ipratropium  0.5 mg Nebulization Q6H  . metoprolol  50 mg Oral BID  . moxifloxacin  400 mg Oral Daily  . pantoprazole  40 mg Oral Q1200  . potassium chloride SA  20 mEq Oral Once  . predniSONE  40 mg Oral QAC breakfast  . predniSONE  60 mg Oral AC breakfast  . sodium chloride  3 mL Intravenous Q12H  . warfarin  2.5 mg Oral ONCE-1800    Assessment/Plan:  CHF:  Improving with over 5L diuresis.  Still with LE edema.  Continue current diuretic dose.  Difficulty sleeping due to tingling in feet but not PND Afib:  Good rate control and anticoagulation  [name] 02/10/2011, 7:20 AM

## 2011-02-11 ENCOUNTER — Other Ambulatory Visit: Payer: Self-pay

## 2011-02-11 LAB — CBC
MCV: 85.6 fL (ref 78.0–100.0)
Platelets: 264 10*3/uL (ref 150–400)
RBC: 4.86 MIL/uL (ref 4.22–5.81)
RDW: 16.1 % — ABNORMAL HIGH (ref 11.5–15.5)
WBC: 11.8 10*3/uL — ABNORMAL HIGH (ref 4.0–10.5)

## 2011-02-11 LAB — BASIC METABOLIC PANEL
CO2: 38 mEq/L — ABNORMAL HIGH (ref 19–32)
Calcium: 9.2 mg/dL (ref 8.4–10.5)
GFR calc Af Amer: 36 mL/min — ABNORMAL LOW (ref >90)
GFR calc non Af Amer: 31 mL/min — ABNORMAL LOW (ref >90)
Sodium: 140 mEq/L (ref 135–145)

## 2011-02-11 LAB — GLUCOSE, CAPILLARY
Glucose-Capillary: 124 mg/dL — ABNORMAL HIGH (ref 70–99)
Glucose-Capillary: 125 mg/dL — ABNORMAL HIGH (ref 70–99)
Glucose-Capillary: 253 mg/dL — ABNORMAL HIGH (ref 70–99)
Glucose-Capillary: 376 mg/dL — ABNORMAL HIGH (ref 70–99)

## 2011-02-11 LAB — PROTIME-INR
INR: 2.46 — ABNORMAL HIGH (ref 0.00–1.49)
Prothrombin Time: 27.1 seconds — ABNORMAL HIGH (ref 11.6–15.2)

## 2011-02-11 MED ORDER — WARFARIN SODIUM 2 MG PO TABS
2.0000 mg | ORAL_TABLET | ORAL | Status: DC
  Administered 2011-02-12: 2 mg via ORAL
  Filled 2011-02-11: qty 1

## 2011-02-11 MED ORDER — FUROSEMIDE 80 MG PO TABS
80.0000 mg | ORAL_TABLET | Freq: Two times a day (BID) | ORAL | Status: DC
  Administered 2011-02-11 – 2011-02-13 (×5): 80 mg via ORAL
  Filled 2011-02-11 (×7): qty 1

## 2011-02-11 MED ORDER — WARFARIN SODIUM 2.5 MG PO TABS
2.5000 mg | ORAL_TABLET | ORAL | Status: DC
  Administered 2011-02-11: 2.5 mg via ORAL
  Filled 2011-02-11 (×2): qty 1

## 2011-02-11 NOTE — Progress Notes (Signed)
ANTICOAGULATION CONSULT NOTE - Follow-up  Pharmacy Consult for warfarin Indication: atrial fibrillation  Allergies  Allergen Reactions  . Aspirin     REACTION: Swelling, trouble breathing  . Tetracycline Hcl     REACTION: Swelling, trouble breathing    Patient Measurements: Height: 5\' 6"  (167.6 cm) Weight: 191 lb 9.6 oz (86.909 kg) IBW/kg (Calculated) : 63.8   Vital Signs: Temp: 97.5 F (36.4 C) (11/21 0507) Temp src: Oral (11/21 0507) BP: 116/70 mmHg (11/21 0507) Pulse Rate: 89  (11/21 0507)  Labs:  Basename 02/11/11 0430 02/10/11 0455 02/09/11 0455  HGB 12.8* -- --  HCT 41.6 -- --  PLT 264 -- --  APTT -- -- --  LABPROT 27.1* 28.5* 28.6*  INR 2.46* 2.63* 2.64*  HEPARINUNFRC -- -- --  CREATININE 2.12* -- 1.97*  CKTOTAL -- -- --  CKMB -- -- --  TROPONINI -- -- --   Estimated Creatinine Clearance: 36.3 ml/min (by C-G formula based on Cr of 2.12).  Medical History: Past Medical History  Diagnosis Date  . ALCOHOL ABUSE, HX OF 04/30/2008  . Atrial fibrillation 12/17/2006  . Atrial flutter 09/20/2006  . 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 Chronic systolic heart failure 09/20/2006    previous EF 25%;  Echocardiogram 12/10/10: Mild-moderate LVH, EF 50-55%, mild MR, moderate LAE, moderate RAE, mild RVE with moderately reduced RV SF, PASP 50, small pericardial effusion  . COPD 12/17/2006  . CORONARY ARTERY DISEASE 09/20/2006    s/p stent to OM;  catheterization 12/06: Mid to distal LAD 30%, D2 20%, proximal circumflex 30%, OM 2 stents patent, ostial RCA 50%, mid RCA 40%, distal RCA multiple 40%  . 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 DYSPEPSIA&OTHER [facility] DISORDERS FUNCTION STOMACH 04/04/2009  . HYPERTENSION 12/17/2006  . OBESITY 04/30/2008  . HLD (hyperlipidemia) 09/20/2006  . OTITIS EXTERNA, CHRONIC NEC 12/28/2006  . PERSONAL HX COLONIC POLYPS 05/02/2009  . PSORIASIS 04/30/2008  . CKD (chronic kidney disease) 04/30/2008   creatinine 2.2 range     Assessment: INR therapeutic on home dose of warfarin of 2mg  PO on Tues, Thurs, Sat and 2.5mg  PO on Mon, Wed, Fri, Sunday. This is day #5/x Avelox (drug interaction: increases INR), but has not influenced INR yet.  Will watch this interaction closely. No bleeding/complications reported.  Goal of Therapy:  INR 2-3   Plan:  Continue home dose.  Today's dose will be 2.5mg  PO x1 Daily PT/INR  [name] Boll 02/11/2011 9:45 AM

## 2011-02-11 NOTE — Progress Notes (Signed)
UR CHART REVIEWED 

## 2011-02-11 NOTE — Progress Notes (Signed)
Subjective: Patient states feeling somewhat better. SOB improving. Patient receptive ti BiV AICD Objective: Vital signs in last 24 hours: Temp:  [97.4 F (36.3 C)-97.8 F (36.6 C)] 97.7 F (36.5 C) (11/21 1754) Pulse Rate:  [86-104] 100  (11/21 1754) Resp:  [18-20] 20  (11/21 1754) BP: (115-148)/(68-94) 137/87 mmHg (11/21 1754) SpO2:  [90 %-100 %] 96 % (11/21 1754) Weight:  [86.909 kg (191 lb 9.6 oz)] 191 lb 9.6 oz (86.909 kg) (11/21 0507) Last BM Date: 02/05/11 Intake/Output from previous day: 11/20 0701 - 11/21 0700 In: 483 [P.O.:480; I.V.:3] Out: 3200 [Urine:3200] Intake/Output this shift:      General Appearance:    Alert, cooperative, in NAD  Lungs:     crackles in lower lung fields bil., respirations unlabored   Heart:    Irregularly irreg, rate controlled, S1 and S2 normal, no murmur, rub   or gallop  Abdomen:     Soft, non-tender, bowel sounds active all four quadrants,   Extremities:   +2edema, no cyanosis  Neurologic:   CNII-XII intact, normal strength,      Weight change: -0.998 kg (-2 lb 3.2 oz)  Intake/Output Summary (Last 24 hours) at 02/11/11 1917 Last data filed at 02/11/11 1700  Gross per 24 hour  Intake    278 ml  Output   3525 ml  Net  -3247 ml    Lab Results:   [facility] 02/11/11 0430 02/09/11 0455  NA 140 133*  K 3.9 4.3  CL 95* 93*  CO2 38* 33*  GLUCOSE 205* 407*  BUN 61* 59*  CREATININE 2.12* 1.97*  CALCIUM 9.2 8.8    Basename 02/11/11 0430  WBC 11.8*  HGB 12.8*  HCT 41.6  PLT 264  MCV 85.6   PT/INR  Basename 02/11/11 0430 02/10/11 0455  LABPROT 27.1* 28.5*  INR 2.46* 2.63*   ABG No results found for this basename: PHART:2,PCO2:2,PO2:2,HCO3:2 in the last 72 hours  Micro Results: No results found for this or any previous visit (from the past 240 hour(s)). Studies/Results: No results found. Medications:  Scheduled Meds:    . antiseptic oral rinse  15 mL Mouth Rinse BID  . ezetimibe-simvastatin  1 tablet Oral QHS  .  Fluticasone-Salmeterol  1 puff Inhalation BID  . furosemide  80 mg Intravenous BID  . guaiFENesin  600 mg Oral BID  . insulin aspart  0-9 Units Subcutaneous Q4H  . insulin glargine  30 Units Subcutaneous QHS  . ipratropium  0.5 mg Nebulization TID  . levalbuterol  0.63 mg Nebulization TID  . metoprolol  50 mg Oral BID  . moxifloxacin  400 mg Oral Daily  . pantoprazole  40 mg Oral Q1200  . potassium chloride SA  20 mEq Oral Once  . predniSONE  40 mg Oral QAC breakfast  . sodium chloride  3 mL Intravenous Q12H  . warfarin  2 mg Oral Custom  . warfarin  2.5 mg Oral Custom   Continuous Infusions:    . sodium chloride     PRN Meds:.acetaminophen, levalbuterol, levalbuterol, nitroGLYCERIN, ondansetron (ZOFRAN) IV, senna-docusate, simethicone, sodium chloride, traMADol, zolpidem Assessment/Plan:  COPD exacerbation - - Clinical improvement. continue prednisone taper, antibiotics and bronchodilators, gradually improving.  .CHF exacerbation - appreciate cardiology input on  IV Lasix and diuresing well, his creatinine is slowing icreasing. Continue monitoring of renal function with diuresis.  avoiding ACE inhibitor,  and avoiding aspirin due to allergy. Will change lasix to 80mg  PO BID . Follow Cardiology ff. .Chest pain on exertion -  likely secondary to above, cardiac enzymes negative. .Atrial fibrillation - currently being rate controlled continue home meds. Coumadin for anticoagulation. He states he is willing to go ahead with Biv AICD. Per cardiology outpatient f/u with EPS..TSH is wnl.  .DM w/o Complication Type II - no rectal sliding scale, hold Lantus for now until blood sugars have stabilized  .HYPERTENSION - continue home meds  .CORONARY ARTERY DISEASE - above continue outpatient medications,cards following. Marland KitchenRENAL INSUFFICIENCY -  Creatinine increasing slowly. recheck in am,continue to monitor as above with diuresis. Change diuretics to oral. .Chronic combined systolic and diastolic  congestive heart failure- See # 2. Per cardiology. .Chronic respiratory failure - continue oxygen and management as above.  .Hypokalemia - resolved  .Hypoglycemia - resolved. .Constipation- on prn senokot      LOS: 5 days   THOMPSON,DANIEL 02/11/2011, 7:17 PM

## 2011-02-11 NOTE — Progress Notes (Signed)
Patient ID: [name], male   DOB: 19-Apr-1945, 65 y.o.   MRN: [id number] Patient ID: [name], male   DOB: 06-Feb-1946, 65 y.o.   MRN: [id number] @ Subjective:  Denies SSCP, palpitations or Dyspnea No willing to entertain idea of BiV AICD Objective:  Vital Signs in the last 24 hours:       Wt Readings from Last 1 Encounters:  02/11/11 86.909 kg (191 lb 9.6 oz)    Temp:  [97.4 F (36.3 C)-97.9 F (36.6 C)] 97.5 F (36.4 C) (11/21 0507) Pulse Rate:  [89-104] 89  (11/21 0507) Resp:  [18-20] 18  (11/21 0507) BP: (116-152)/(70-98) 116/70 mmHg (11/21 0507) SpO2:  [90 %-100 %] 98 % (11/21 0507) Weight:  [86.909 kg (191 lb 9.6 oz)] 191 lb 9.6 oz (86.909 kg) (11/21 0507)  Intake/Output from previous day: 11/20 0701 - 11/21 0700 In: 483 [P.O.:480; I.V.:3] Out: 3200 [Urine:3200] Intake/Output from this shift:    Physical Exam: General appearance: alert and no distress Neck: JVD - 10 cm above sternal notch, no adenopathy, no carotid bruit, no JVD, supple, symmetrical, trachea midline and thyroid not enlarged, symmetric, no tenderness/mass/nodules Lungs: Decreased breath sounds and crackles both bases Heart: irregular rate and rhythm S1/S2 no murmur Extremities: edema Plus two bilateral Pulses: 2+ and symmetric  Lab Results:  Basename 02/11/11 0430  WBC 11.8*  HGB 12.8*  PLT 264    Basename 02/11/11 0430 02/09/11 0455  NA 140 133*  K 3.9 4.3  CL 95* 93*  CO2 38* 33*  GLUCOSE 205* 407*  BUN 61* 59*  CREATININE 2.12* 1.97*   No results found for this basename: TROPONINI:2,CK,MB:2 in the last 72 hours Hepatic Function Panel No results found for this basename: PROT,ALBUMIN,AST,ALT,ALKPHOS,BILITOT,BILIDIR,IBILI in the last 72 hours No results found for this basename: CHOL in the last 72 hours  Medications:      . antiseptic oral rinse  15 mL Mouth Rinse BID  . ezetimibe-simvastatin  1 tablet Oral QHS  . Fluticasone-Salmeterol  1 puff Inhalation BID  . furosemide   80 mg Intravenous BID  . guaiFENesin  600 mg Oral BID  . insulin aspart  0-9 Units Subcutaneous Q4H  . insulin glargine  30 Units Subcutaneous QHS  . ipratropium  0.5 mg Nebulization TID  . levalbuterol  0.63 mg Nebulization TID  . metoprolol  50 mg Oral BID  . moxifloxacin  400 mg Oral Daily  . pantoprazole  40 mg Oral Q1200  . potassium chloride SA  20 mEq Oral Once  . predniSONE  40 mg Oral QAC breakfast  . sodium chloride  3 mL Intravenous Q12H  . warfarin  2 mg Oral ONCE-1800  . DISCONTD: ipratropium  0.5 mg Nebulization Q6H    Assessment/Plan:  CHF:  Improving with good diuresis.  Likely need to back off on diuretic in am due to azotemia.  After D/C will arrange F/U with EPS for elective biV AICD Afib:  Good rate control and anticoagulation  Needs IS and F/U CXR  [name] 02/11/2011, 7:47 AM

## 2011-02-12 ENCOUNTER — Inpatient Hospital Stay (HOSPITAL_COMMUNITY): Payer: Medicare Other

## 2011-02-12 LAB — GLUCOSE, CAPILLARY
Glucose-Capillary: 318 mg/dL — ABNORMAL HIGH (ref 70–99)
Glucose-Capillary: 361 mg/dL — ABNORMAL HIGH (ref 70–99)

## 2011-02-12 LAB — BASIC METABOLIC PANEL
Calcium: 9.2 mg/dL (ref 8.4–10.5)
GFR calc Af Amer: 39 mL/min — ABNORMAL LOW (ref >90)
GFR calc non Af Amer: 34 mL/min — ABNORMAL LOW (ref >90)
Glucose, Bld: 335 mg/dL — ABNORMAL HIGH (ref 70–99)
Potassium: 4.1 mEq/L (ref 3.5–5.1)
Sodium: 138 mEq/L (ref 135–145)

## 2011-02-12 LAB — PROTIME-INR
INR: 2.47 — ABNORMAL HIGH (ref 0.00–1.49)
Prothrombin Time: 27.2 seconds — ABNORMAL HIGH (ref 11.6–15.2)

## 2011-02-12 LAB — CBC
MCH: 26.3 pg (ref 26.0–34.0)
MCHC: 31 g/dL (ref 30.0–36.0)
RDW: 16.2 % — ABNORMAL HIGH (ref 11.5–15.5)

## 2011-02-12 MED ORDER — INSULIN REGULAR HUMAN 100 UNIT/ML IJ SOLN: 8.0000 [IU] | Freq: Once | INTRAMUSCULAR | Status: DC

## 2011-02-12 MED ORDER — WARFARIN SODIUM 2 MG PO TABS
2.0000 mg | ORAL_TABLET | Freq: Once | ORAL | Status: DC
  Filled 2011-02-12: qty 1

## 2011-02-12 MED ORDER — INSULIN ASPART 100 UNIT/ML ~~LOC~~ SOLN
8.0000 [IU] | Freq: Once | SUBCUTANEOUS | Status: DC
  Filled 2011-02-12: qty 0.08

## 2011-02-12 MED ORDER — INSULIN REGULAR HUMAN 100 UNIT/ML IJ SOLN
8.0000 [IU] | Freq: Once | INTRAMUSCULAR | Status: AC
  Administered 2011-02-12: 8 [IU] via SUBCUTANEOUS
  Filled 2011-02-12: qty 0.08

## 2011-02-12 NOTE — Progress Notes (Signed)
Patient ID: [name], male   DOB: Feb 20, 1946, 65 y.o.   MRN: [id number]  @ Subjective:  Denies SSCP, palpitations and  Dyspnea is improved Now willing to entertain idea of BiV AICD  Objective:  Patient Vitals for the past 24 hrs:  BP Temp Temp src Pulse Resp SpO2 Weight  02/12/11 0800 - - - - - 95 % -  02/12/11 0550 139/84 mmHg 97.8 F (36.6 C) Oral 84  18  98 % 86.75 kg (191 lb 4 oz)  02/11/11 2040 137/91 mmHg 97.6 F (36.4 C) Oral 85  20  99 % -  02/11/11 2023 - - - - - 98 % -  02/11/11 1754 137/87 mmHg 97.7 F (36.5 C) Oral 100  20  96 % -  02/11/11 1434 127/78 mmHg 97.7 F (36.5 C) Oral 97  20  100 % -    Intake/Output from previous day: 11/21 0701 - 11/22 0700 In: 518 [P.O.:240; I.V.:278] Out: 2825 [Urine:2825]  Physical Exam: General appearance: alert and no distress Neck: JVD - 10 cm above sternal notch, no adenopathy, no carotid bruit, no JVD, supple, symmetrical, trachea midline and thyroid not enlarged, symmetric, no tenderness/mass/nodules Lungs: Decreased breath sounds and crackles both bases Heart: irregular rate and rhythm S1/S2 no murmur Extremities: edema Plus one  bilateral Pulses: 2+ and symmetric  Lab Results:  Basename 02/12/11 0455 02/11/11 0430  WBC 12.4* 11.8*  HGB 12.5* 12.8*  PLT 259 264    Basename 02/12/11 0455 02/11/11 0430  NA 138 140  K 4.1 3.9  CL 91* 95*  CO2 39* 38*  GLUCOSE 335* 205*  BUN 59* 61*  CREATININE 1.98* 2.12*    Medications:    . antiseptic oral rinse  15 mL Mouth Rinse BID  . ezetimibe-simvastatin  1 tablet Oral QHS  . Fluticasone-Salmeterol  1 puff Inhalation BID  . furosemide  80 mg Oral BID  . guaiFENesin  600 mg Oral BID  . insulin aspart  0-9 Units Subcutaneous Q4H  . insulin glargine  30 Units Subcutaneous QHS  . ipratropium  0.5 mg Nebulization TID  . levalbuterol  0.63 mg Nebulization TID  . metoprolol  50 mg Oral BID  . moxifloxacin  400 mg Oral Daily  . pantoprazole  40 mg Oral Q1200  .  potassium chloride SA  20 mEq Oral Once  . predniSONE  40 mg Oral QAC breakfast  . sodium chloride  3 mL Intravenous Q12H  . warfarin  2 mg Oral Custom  . warfarin  2.5 mg Oral Custom  . DISCONTD: furosemide  80 mg Intravenous BID    Assessment/Plan:  CHF:  Improving with good diuresis.  CXR this am  Probably ready for D/C today or tomorrow Afib:  Good rate control and anticoagulation  I will arrange outpt F/U with Dr [name] in 2 weeks.  Will sign off    [name] 02/12/2011, 9:47 AM

## 2011-02-12 NOTE — Plan of Care (Signed)
Problem: Phase II Progression Outcomes Goal: Walk in hall or up in chair TID Outcome: Progressing Pt. Up in room ad lib.

## 2011-02-12 NOTE — Progress Notes (Signed)
Subjective:  SOB improving. Patient receptive to  BiV AICD. No complaints Objective: Vital signs in last 24 hours: Temp:  [97.6 F (36.4 C)-98.3 F (36.8 C)] 98.3 F (36.8 C) (11/22 1034) Pulse Rate:  [84-100] 89  (11/22 1034) Resp:  [18-20] 18  (11/22 1034) BP: (127-142)/(78-91) 142/88 mmHg (11/22 1034) SpO2:  [95 %-100 %] 100 % (11/22 1034) Weight:  [86.75 kg (191 lb 4 oz)] 191 lb 4 oz (86.75 kg) (11/22 0550) Last BM Date: 02/11/11 Intake/Output from previous day: 11/21 0701 - 11/22 0700 In: 518 [P.O.:240; I.V.:278] Out: 2825 [Urine:2825] Intake/Output this shift: Total I/O In: 240 [P.O.:240] Out: -     General Appearance:    Alert, cooperative, in NAD  Lungs:     crackles in lower lung fields bil., respirations unlabored   Heart:    Irregularly irreg, rate controlled, S1 and S2 normal, no murmur, rub   or gallop  Abdomen:     Soft, non-tender, bowel sounds active all four quadrants,   Extremities:   +2edema, no cyanosis  Neurologic:   CNII-XII intact, normal strength,      Weight change: -0.159 kg (-5.6 oz)  Intake/Output Summary (Last 24 hours) at 02/12/11 1419 Last data filed at 02/12/11 0918  Gross per 24 hour  Intake    583 ml  Output   1250 ml  Net   -667 ml    Lab Results:   [facility] 02/12/11 0455 02/11/11 0430  NA 138 140  K 4.1 3.9  CL 91* 95*  CO2 39* 38*  GLUCOSE 335* 205*  BUN 59* 61*  CREATININE 1.98* 2.12*  CALCIUM 9.2 9.2    Basename 02/12/11 0455 02/11/11 0430  WBC 12.4* 11.8*  HGB 12.5* 12.8*  HCT 40.3 41.6  PLT 259 264  MCV 84.7 85.6   PT/INR  Basename 02/12/11 0455 02/11/11 0430  LABPROT 27.2* 27.1*  INR 2.47* 2.46*   ABG No results found for this basename: PHART:2,PCO2:2,PO2:2,HCO3:2 in the last 72 hours  Micro Results: No results found for this or any previous visit (from the past 240 hour(s)). Studies/Results: Dg Chest 2 View  02/12/2011  *RADIOLOGY REPORT*  Clinical Data: Dyspnea and weakness.  CHEST - 2 VIEW   Comparison: 02/06/2011  Findings: Two views of the chest were obtained.  There is enlargement of the cardiac silhouette.  Again noted are pleural- based densities at the right lung base suggestive for pleural fluid.  Increased densities in the right lung base could represent consolidation or airspace disease.  Slightly prominent vascular markings in the upper lungs.  Incidentally, the patient has an azygos lobe.  The left basilar lung densities have minimally changed.  IMPRESSION: Concern for increased densities at the right lung base. Differential would include worsening atelectasis versus infection.  Evidence for pleural fluid.  Cardiomegaly.  Prominent vascular markings suggestive for vascular congestion.  Original Report Authenticated By: Richarda Overlie, M.D.   Medications:  Scheduled Meds:    . antiseptic oral rinse  15 mL Mouth Rinse BID  . ezetimibe-simvastatin  1 tablet Oral QHS  . Fluticasone-Salmeterol  1 puff Inhalation BID  . furosemide  80 mg Oral BID  . guaiFENesin  600 mg Oral BID  . insulin aspart  0-9 Units Subcutaneous Q4H  . insulin glargine  30 Units Subcutaneous QHS  . ipratropium  0.5 mg Nebulization TID  . levalbuterol  0.63 mg Nebulization TID  . metoprolol  50 mg Oral BID  . moxifloxacin  400 mg Oral Daily  .  pantoprazole  40 mg Oral Q1200  . potassium chloride SA  20 mEq Oral Once  . predniSONE  40 mg Oral QAC breakfast  . sodium chloride  3 mL Intravenous Q12H  . warfarin  2 mg Oral Custom  . warfarin  2 mg Oral ONCE-1800  . warfarin  2.5 mg Oral Custom  . DISCONTD: furosemide  80 mg Intravenous BID   Continuous Infusions:    . sodium chloride     PRN Meds:.acetaminophen, levalbuterol, levalbuterol, nitroGLYCERIN, ondansetron (ZOFRAN) IV, senna-docusate, simethicone, sodium chloride, traMADol, zolpidem Assessment/Plan:  COPD exacerbation - - Clinical improvement. continue prednisone taper, antibiotics and bronchodilators,  .CHF exacerbation - Clinical  improvement. Good output. Lasix changed to oral and patient diuresing well. appreciate cardiology input on  IV Lasix and diuresing well. Creatinine is slowing trending back down.. Continue monitoring of renal function with diuresis.  avoiding ACE inhibitor,  and avoiding aspirin due to allergy. Cardiology ff. .Chest pain on exertion - likely secondary to above, cardiac enzymes negative. .Atrial fibrillation - currently being rate controlled continue home meds. Coumadin for anticoagulation. He states he is willing to go ahead with Biv AICD. Per cardiology outpatient f/u with EPS..TSH is wnl.  .DM w/o Complication Type II - no rectal sliding scale, hold Lantus for now until blood sugars have stabilized. Marland KitchenHYPERTENSION - continue home meds  .CORONARY ARTERY DISEASE - above continue outpatient medications,cards following. Marland KitchenRENAL INSUFFICIENCY -  Creatinine increasing slowly. recheck in am,continue to monitor as above with diuresis. Creatinine slowly trending down. .Chronic combined systolic and diastolic congestive heart failure- See # 2. Per cardiology. .Chronic respiratory failure - continue oxygen and management as above.  .Hypokalemia - resolved  .Hypoglycemia - resolved. .Constipation- on prn senokot      LOS: 6 days   THOMPSON,DANIEL 02/12/2011, 2:19 PM

## 2011-02-12 NOTE — Progress Notes (Signed)
ANTICOAGULATION CONSULT NOTE - Follow-up  Pharmacy Consult for warfarin Indication: atrial fibrillation  Allergies  Allergen Reactions  . Aspirin     REACTION: Swelling, trouble breathing  . Tetracycline Hcl     REACTION: Swelling, trouble breathing    Patient Measurements: Height: 5\' 6"  (167.6 cm) Weight: 191 lb 4 oz (86.75 kg) IBW/kg (Calculated) : 63.8   Vital Signs: Temp: 98.3 F (36.8 C) (11/22 1034) Temp src: Oral (11/22 1034) BP: 142/88 mmHg (11/22 1034) Pulse Rate: 89  (11/22 1034)  Labs:  Basename 02/12/11 0455 02/11/11 0430 02/10/11 0455  HGB 12.5* 12.8* --  HCT 40.3 41.6 --  PLT 259 264 --  APTT -- -- --  LABPROT 27.2* 27.1* 28.5*  INR 2.47* 2.46* 2.63*  HEPARINUNFRC -- -- --  CREATININE 1.98* 2.12* --  CKTOTAL -- -- --  CKMB -- -- --  TROPONINI -- -- --   Estimated Creatinine Clearance: 38.9 ml/min (by C-G formula based on Cr of 1.98).  Medical History: Past Medical History  Diagnosis Date  . ALCOHOL ABUSE, HX OF 04/30/2008  . Atrial fibrillation 12/17/2006  . Atrial flutter 09/20/2006  . 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 Chronic systolic heart failure 09/20/2006    previous EF 25%;  Echocardiogram 12/10/10: Mild-moderate LVH, EF 50-55%, mild MR, moderate LAE, moderate RAE, mild RVE with moderately reduced RV SF, PASP 50, small pericardial effusion  . COPD 12/17/2006  . CORONARY ARTERY DISEASE 09/20/2006    s/p stent to OM;  catheterization 12/06: Mid to distal LAD 30%, D2 20%, proximal circumflex 30%, OM 2 stents patent, ostial RCA 50%, mid RCA 40%, distal RCA multiple 40%  . 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 DYSPEPSIA&OTHER [facility] DISORDERS FUNCTION STOMACH 04/04/2009  . HYPERTENSION 12/17/2006  . OBESITY 04/30/2008  . HLD (hyperlipidemia) 09/20/2006  . OTITIS EXTERNA, CHRONIC NEC 12/28/2006  . PERSONAL HX COLONIC POLYPS 05/02/2009  . PSORIASIS 04/30/2008  . CKD (chronic kidney disease) 04/30/2008   creatinine 2.2 range     Assessment: INR therapeutic on home dose of warfarin of 2mg  PO on Tues, Thurs, Sat and 2.5mg  PO on Mon, Wed, Fri, Sunday. This is day #6/x Avelox (drug interaction: increases INR), but has not influenced INR yet.  Will watch this interaction closely. No bleeding/complications reported.  Goal of Therapy:  INR 2-3   Plan:  Continue home dose.  Today's dose will be 2mg  PO x1 Daily PT/INR  Rollene Fare 02/12/2011 11:18 AM

## 2011-02-13 LAB — BASIC METABOLIC PANEL
GFR calc Af Amer: 42 mL/min — ABNORMAL LOW (ref >90)
GFR calc non Af Amer: 37 mL/min — ABNORMAL LOW (ref >90)
Potassium: 3.5 mEq/L (ref 3.5–5.1)
Sodium: 141 mEq/L (ref 135–145)

## 2011-02-13 LAB — PROTIME-INR
INR: 2.95 — ABNORMAL HIGH (ref 0.00–1.49)
Prothrombin Time: 31.2 seconds — ABNORMAL HIGH (ref 11.6–15.2)

## 2011-02-13 LAB — GLUCOSE, CAPILLARY
Glucose-Capillary: 61 mg/dL — ABNORMAL LOW (ref 70–99)
Glucose-Capillary: 74 mg/dL (ref 70–99)

## 2011-02-13 MED ORDER — WARFARIN SODIUM 1 MG PO TABS
1.0000 mg | ORAL_TABLET | Freq: Once | ORAL | Status: AC
  Administered 2011-02-13: 1 mg via ORAL
  Filled 2011-02-13: qty 1

## 2011-02-13 MED ORDER — INSULIN REGULAR HUMAN 100 UNIT/ML IJ SOLN
10.0000 [IU] | Freq: Once | INTRAMUSCULAR | Status: AC
  Administered 2011-02-13: 10 [IU] via INTRAVENOUS
  Filled 2011-02-13: qty 0.1

## 2011-02-13 MED ORDER — POTASSIUM CHLORIDE CRYS ER 20 MEQ PO TBCR: 40.0000 meq | EXTENDED_RELEASE_TABLET | Freq: Every day | ORAL | Status: DC

## 2011-02-13 MED ORDER — POTASSIUM CHLORIDE CRYS ER 20 MEQ PO TBCR
40.0000 meq | EXTENDED_RELEASE_TABLET | Freq: Once | ORAL | Status: AC
  Administered 2011-02-13: 40 meq via ORAL
  Filled 2011-02-13: qty 2

## 2011-02-13 NOTE — Discharge Summary (Signed)
Discharge Summary  [name] MR#: [id number]  DOB:August 30, 1945  Date of Admission: 02/06/2011 Date of Discharge: 02/13/2011  Patient's PCP: [name], MD  Attending Physician:THOMPSON,DANIEL  Consults:   Cardiology : Dr Dietrich Pates  Discharge Diagnoses: CHF exacerbation Present on Admission:  .COPD exacerbation .CHF exacerbation .Chest pain on exertion .Atrial fibrillation .DM w/o Complication Type II .HYPERTENSION .CORONARY ARTERY DISEASE .RENAL INSUFFICIENCY .Chronic combined systolic and diastolic congestive heart failure .Chronic respiratory failure .Hypokalemia .Hypoglycemia   Brief Admitting History and Physical [name] [name] is a 65 y.o. male  has a past medical history of ALCOHOL ABUSE, HX OF (04/30/2008); Atrial fibrillation (12/17/2006); Atrial flutter (09/20/2006); COAGULOPATHY, COUMADIN-INDUCED (04/04/2009); Chronic systolic heart failure (09/20/2006); COPD (12/17/2006); CORONARY ARTERY DISEASE (09/20/2006); DIABETIC PERIPHERAL NEUROPATHY (11/11/2009); DM w/o Complication Type II (09/20/2006); DYSPEPSIA&OTHER SPEC DISORDERS FUNCTION STOMACH (04/04/2009); HYPERTENSION (12/17/2006); OBESITY (04/30/2008); HLD (hyperlipidemia) (09/20/2006); OTITIS EXTERNA, CHRONIC NEC (12/28/2006); PERSONAL HX COLONIC POLYPS (05/02/2009); PSORIASIS (04/30/2008); and CKD (chronic kidney disease) (04/30/2008).  Presented with  Shortness of breath it's been occurring for the past day. He was planning on going to church and took off his oxygen started to feel short of breath and presyncopal. At the same time the lights went off in his house, he went outside without his oxygen to figure out what's going on once he came back he was severely short of breath presyncopal with chest tightness and chest pain. His wife noticed that he was very pale. He decided to go in to emergency department to seek further medical help. Only alone he been feeling fatigued, tight in his abdomen, tight in his chest and  difficulty breathing. Recently he had been having increased cough productive of green sputum. No fevers or chills.  He is currently chest pain-free but still feels somewhat short of breath. He states that lately his legs but more swollen than usual. He layed down in his legs up and this has helped.  For the rest of admission history and physical please see H&P dictated by Dr. Adela Glimpse.     Discharge Medications Current Discharge Medication List    START taking these medications   Details  potassium chloride SA (K-DUR,KLOR-CON) 20 MEQ tablet Take 2 tablets (40 mEq total) by mouth daily. Qty: 60 tablet, Refills: 0      CONTINUE these medications which have NOT CHANGED   Details  albuterol (PROAIR HFA) 108 (90 BASE) MCG/ACT inhaler Inhale 2 puffs into the lungs every 6 (six) hours as needed.      co-enzyme Q-10 30 MG capsule Take 30 mg by mouth daily.      ezetimibe-simvastatin (VYTORIN) 10-40 MG per tablet Take 1 tablet by mouth at bedtime.      Fluticasone-Salmeterol (ADVAIR DISKUS) 250-50 MCG/DOSE AEPB Inhale 1 puff into the lungs every 12 (twelve) hours. Qty: 60 each, Refills: 4    furosemide (LASIX) 40 MG tablet Take 80 mg by mouth 2 (two) times daily.      insulin glargine (LANTUS) 100 UNIT/ML injection 30 units at bedtime Qty: 10 mL, Refills: 3    insulin lispro (HUMALOG) 100 UNIT/ML injection Inject 20 units before each meal - if blood sugars more than 200 add 8 units. Qty: 45 mL, Refills: 6    metoprolol (TOPROL-XL) 50 MG 24 hr tablet Take 1 tablet (50 mg total) by mouth 2 (two) times daily. Qty: 60 tablet, Refills: 11    Multiple Vitamin (MULTIVITAMIN) tablet Take 1 tablet by mouth daily.      Phenyleph-Chlorphen-Hydrocod (HYDROCODONE-PE-CHLORPHENIRAMIN PO)  Take 5 mLs by mouth every 12 (twelve) hours.      RABEprazole (ACIPHEX) 20 MG tablet Take 20 mg by mouth daily.      temazepam (RESTORIL) 30 MG capsule TAKE ONE CAPSULE BY MOUTH AT BEDTIME AS NEEDED FOR  SLEEP Qty: 30 capsule, Refills: 1    tiotropium (SPIRIVA HANDIHALER) 18 MCG inhalation capsule Place 1 capsule (18 mcg total) into inhaler and inhale daily. Qty: 90 capsule, Refills: 1    traMADol (ULTRAM) 50 MG tablet TAKE ONE TABLET BY MOUTH EVERY 6 HOURS AS NEEDED Qty: 90 tablet, Refills: 1    !! warfarin (COUMADIN) 2 MG tablet Take 2 mg by mouth daily. Tuesday, Thursday and Saturday    !! warfarin (COUMADIN) 5 MG tablet Take 2.5 mg by mouth as directed. Takes on Monday, Wednesday, Friday and Sunday.     !! - Potential duplicate medications found. Please discuss with provider.    STOP taking these medications     Insulin Pen Needle (NOVOFINE) 32G X 6 MM MISC         Hospital Course: 1.CHF exacerbation- patient was admitted to the hospital with worsening shortness of breath which was felt to be multifactorial secondary to an acute CHF exacerbation as well as an acute COPD exacerbation. Patient was placed on telemetry. Chest x-ray which was done was consistent with CHF. Patient was started on IV Lasix. Cardiac enzymes which were cycled were negative x3. Patient had a recent 2-D echo done and as such this was not repeated. A cardiology consultation was obtained, and patient was seen in consultation by Dr. Dietrich Pates of [facility] on 02/09/2011. Patient was noted at that time to have markedly volume overload as well as progressive shortness of breath. Patient's IV Lasix dose was increased per cardiology and his renal function was monitored. Patient had discussions prior to admission for AV nodal ablation and biventricular pacer placement. Patient diuresed well during this hospitalization, and his renal function was followed. Patient improved clinically on a daily basis during this hospitalization. Patient's IV Lasix was subsequently transitioned to oral Lasix . In this hospitalization his biventricular pacer was discussed with the patient and  he  decided that he wanted to pursue  biventricular pacer. Patient discussed this with his with cardiology and an appointment will be made for him to followup with Dr. [name] as outpatient. Patient will be discharged in stable and improved condition, with an increase in his home dose of Lasix . Has been a pleasure taking care of [name]. 2. Acute on chronic respiratory failure/acute COPD exacerbation - patient was admitted with worsening shortness of breath which was felt to be multifactorial secondary to problem #1 and acute COPD exacerbation. For CHF please see problem #1. On admission patient was placed on IV steroids, IV antibiotics, mucolytic, cough suppressants. Patient improved slowly but clinically during the hospitalization IV steroids were tapered to oral steroids and patient a tapered course. Patient also received 7 days worth of antibiotic therapy, as well as nebulizer treatments. Patient will be discharged in stable and improved condition with followup with his oncologist and PCP. Its been pleasure taking care of him. The rest of patient's chronic medical issues remained stable during the hospitalization patient be discharged in stable and improved condition.   Present on Admission:  .COPD exacerbation .CHF exacerbation .Chest pain on exertion .Atrial fibrillation .DM w/o Complication Type II .HYPERTENSION .CORONARY ARTERY DISEASE .RENAL INSUFFICIENCY .Chronic combined systolic and diastolic congestive heart failure .Chronic respiratory failure .Hypokalemia .  Hypoglycemia   Day of Discharge BP 129/76  Pulse 100  Temp(Src) 97.5 F (36.4 C) (Oral)  Resp 20  Ht 5\' 6"  (1.676 m)  Wt 85.3 kg (188 lb 0.8 oz)  BMI 30.35 kg/m2  SpO2 98% General: Alert, awake, oriented x3, in no acute distress. HEENT: No bruits, no goiter. Heart: Regular rate and rhythm, without murmurs, rubs, gallops. Lungs: Bibasilar crackles, no wheezes. Fair air movement. Abdomen: Soft, nontender, nondistended, positive bowel  sounds. Extremities: No clubbing cyanosis or edema with positive pedal pulses. Neuro: Grossly intact, nonfocal.   Results for orders placed during the hospital encounter of 02/06/11 (from the past 48 hour(s))  GLUCOSE, CAPILLARY     Status: Abnormal   Collection Time   02/11/11  4:42 PM      Component Value Range Comment   Glucose-Capillary 253 (*) 70 - 99 (mg/dL)   GLUCOSE, CAPILLARY     Status: Abnormal   Collection Time   02/11/11  8:50 PM      Component Value Range Comment   Glucose-Capillary 296 (*) 70 - 99 (mg/dL)   GLUCOSE, CAPILLARY     Status: Abnormal   Collection Time   02/11/11 11:50 PM      Component Value Range Comment   Glucose-Capillary 376 (*) 70 - 99 (mg/dL)   PROTIME-INR     Status: Abnormal   Collection Time   02/12/11  4:55 AM      Component Value Range Comment   Prothrombin Time 27.2 (*) 11.6 - 15.2 (seconds)    INR 2.47 (*) 0.00 - 1.49    CBC     Status: Abnormal   Collection Time   02/12/11  4:55 AM      Component Value Range Comment   WBC 12.4 (*) 4.0 - 10.5 (K/uL)    RBC 4.76  4.22 - 5.81 (MIL/uL)    Hemoglobin 12.5 (*) 13.0 - 17.0 (g/dL)    HCT 16.1  09.6 - 04.5 (%)    MCV 84.7  78.0 - 100.0 (fL)    MCH 26.3  26.0 - 34.0 (pg)    MCHC 31.0  30.0 - 36.0 (g/dL)    RDW 40.9 (*) 81.1 - 15.5 (%)    Platelets 259  150 - 400 (K/uL)   BASIC METABOLIC PANEL     Status: Abnormal   Collection Time   02/12/11  4:55 AM      Component Value Range Comment   Sodium 138  135 - 145 (mEq/L)    Potassium 4.1  3.5 - 5.1 (mEq/L)    Chloride 91 (*) 96 - 112 (mEq/L)    CO2 39 (*) 19 - 32 (mEq/L)    Glucose, Bld 335 (*) 70 - 99 (mg/dL)    BUN 59 (*) 6 - 23 (mg/dL)    Creatinine, Ser 9.14 (*) 0.50 - 1.35 (mg/dL)    Calcium 9.2  8.4 - 10.5 (mg/dL)    GFR calc non Af Amer 34 (*) >90 (mL/min)    GFR calc Af Amer 39 (*) >90 (mL/min)   GLUCOSE, CAPILLARY     Status: Abnormal   Collection Time   02/12/11  5:00 AM      Component Value Range Comment    Glucose-Capillary 318 (*) 70 - 99 (mg/dL)   GLUCOSE, CAPILLARY     Status: Abnormal   Collection Time   02/12/11  7:39 AM      Component Value Range Comment   Glucose-Capillary 231 (*) 70 - 99 (mg/dL)  GLUCOSE, CAPILLARY     Status: Abnormal   Collection Time   02/12/11 11:27 AM      Component Value Range Comment   Glucose-Capillary 203 (*) 70 - 99 (mg/dL)   GLUCOSE, CAPILLARY     Status: Abnormal   Collection Time   02/12/11  4:55 PM      Component Value Range Comment   Glucose-Capillary 361 (*) 70 - 99 (mg/dL)   PROTIME-INR     Status: Abnormal   Collection Time   02/13/11  4:55 AM      Component Value Range Comment   Prothrombin Time 31.2 (*) 11.6 - 15.2 (seconds)    INR 2.95 (*) 0.00 - 1.49    BASIC METABOLIC PANEL     Status: Abnormal   Collection Time   02/13/11  4:55 AM      Component Value Range Comment   Sodium 141  135 - 145 (mEq/L)    Potassium 3.5  3.5 - 5.1 (mEq/L)    Chloride 94 (*) 96 - 112 (mEq/L)    CO2 39 (*) 19 - 32 (mEq/L)    Glucose, Bld 137 (*) 70 - 99 (mg/dL)    BUN 52 (*) 6 - 23 (mg/dL)    Creatinine, Ser 5.95 (*) 0.50 - 1.35 (mg/dL)    Calcium 8.9  8.4 - 10.5 (mg/dL)    GFR calc non Af Amer 37 (*) >90 (mL/min)    GFR calc Af Amer 42 (*) >90 (mL/min)   GLUCOSE, CAPILLARY     Status: Abnormal   Collection Time   02/13/11  8:29 AM      Component Value Range Comment   Glucose-Capillary 61 (*) 70 - 99 (mg/dL)   GLUCOSE, CAPILLARY     Status: Normal   Collection Time   02/13/11  8:53 AM      Component Value Range Comment   Glucose-Capillary 74  70 - 99 (mg/dL)   GLUCOSE, CAPILLARY     Status: Abnormal   Collection Time   02/13/11  9:19 AM      Component Value Range Comment   Glucose-Capillary 102 (*) 70 - 99 (mg/dL)   GLUCOSE, CAPILLARY     Status: Abnormal   Collection Time   02/13/11 11:10 AM      Component Value Range Comment   Glucose-Capillary 164 (*) 70 - 99 (mg/dL)     Dg Chest 2 View  [id number]/[id number]  *RADIOLOGY REPORT*  Clinical  Data: Dyspnea and weakness.  CHEST - 2 VIEW  Comparison: 02/06/2011  Findings: Two views of the chest were obtained.  There is enlargement of the cardiac silhouette.  Again noted are pleural- based densities at the right lung base suggestive for pleural fluid.  Increased densities in the right lung base could represent consolidation or airspace disease.  Slightly prominent vascular markings in the upper lungs.  Incidentally, the patient has an azygos lobe.  The left basilar lung densities have minimally changed.  IMPRESSION: Concern for increased densities at the right lung base. Differential would include worsening atelectasis versus infection.  Evidence for pleural fluid.  Cardiomegaly.  Prominent vascular markings suggestive for vascular congestion.  Original Report Authenticated By: Richarda Overlie, M.D.   Dg Chest 2 View  02/06/2011  *RADIOLOGY REPORT*  Clinical Data: Shortness of breath.  Previous myocardial infarct.  CHEST - 2 VIEW  Comparison: 12/24/2010  Findings: Moderate cardiomegaly stable.  New bibasilar interstitial infiltrates and small bilateral pleural effusions are seen, consistent with mild congestive heart failure.  Azygos fissure again noted.  IMPRESSION: Mild acute congestive heart failure with small bilateral pleural effusions.  Original Report Authenticated By: Danae Orleans, M.D.     Disposition: Home  Diet: Heart healthy  Activity: Increase slowly   Follow-up Appts: Discharge Orders    Future Appointments: Provider: Department: Dept Phone: Center:   02/16/2011 2:30 PM Raul Del, RN [facility] [phone] None   02/25/2011 10:00 AM [facility] [phone] None   02/25/2011 11:00 AM Barbaraann Share, MD [facility] [phone] None   03/10/2011 1:45 PM Theron Arista Charissa Bash [facility] [phone] [facility]     Future Orders Please Complete By Expires   Diet - low sodium heart healthy      Increase activity slowly       Discharge instructions      Comments:   Followup with [name], MD 1 week. [facility] to call for follow up with Dr [name]       TESTS THAT NEED FOLLOW-UP BMET   Time spent on discharge, talking to the patient, and coordinating care: Greater than 60 mins.   SignedRamiro Harvest 02/13/2011, 3:10 PM

## 2011-02-13 NOTE — Plan of Care (Signed)
Problem: Phase I Progression Outcomes Goal: Dyspnea controlled at rest (HF) Outcome: Progressing Pt. On chronic O2 at home at 2 l/min via [place].

## 2011-02-13 NOTE — Progress Notes (Signed)
ANTICOAGULATION CONSULT NOTE - Follow-up  Pharmacy Consult for warfarin Indication: atrial fibrillation  Allergies  Allergen Reactions  . Aspirin     REACTION: Swelling, trouble breathing  . Tetracycline Hcl     REACTION: Swelling, trouble breathing    Patient Measurements: Height: 5\' 6"  (167.6 cm) Weight: 188 lb 0.8 oz (85.3 kg) (standing scale B) IBW/kg (Calculated) : 63.8   Vital Signs: Temp: 97.7 F (36.5 C) (11/23 0507) Temp src: Oral (11/23 0507) BP: 131/86 mmHg (11/23 0507) Pulse Rate: 92  (11/23 0507)  Labs:  Basename 02/13/11 0455 02/12/11 0455 02/11/11 0430  HGB -- 12.5* 12.8*  HCT -- 40.3 41.6  PLT -- 259 264  APTT -- -- --  LABPROT 31.2* 27.2* 27.1*  INR 2.95* 2.47* 2.46*  HEPARINUNFRC -- -- --  CREATININE 1.86* 1.98* 2.12*  CKTOTAL -- -- --  CKMB -- -- --  TROPONINI -- -- --   Estimated Creatinine Clearance: 41.1 ml/min (by C-G formula based on Cr of 1.86).  Medical History: Past Medical History  Diagnosis Date  . ALCOHOL ABUSE, HX OF 04/30/2008  . Atrial fibrillation 12/17/2006  . Atrial flutter 09/20/2006  . 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 Chronic systolic heart failure 09/20/2006    previous EF 25%;  Echocardiogram 12/10/10: Mild-moderate LVH, EF 50-55%, mild MR, moderate LAE, moderate RAE, mild RVE with moderately reduced RV SF, PASP 50, small pericardial effusion  . COPD 12/17/2006  . CORONARY ARTERY DISEASE 09/20/2006    s/p stent to OM;  catheterization 12/06: Mid to distal LAD 30%, D2 20%, proximal circumflex 30%, OM 2 stents patent, ostial RCA 50%, mid RCA 40%, distal RCA multiple 40%  . 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 DYSPEPSIA&OTHER [facility] DISORDERS FUNCTION STOMACH 04/04/2009  . HYPERTENSION 12/17/2006  . OBESITY 04/30/2008  . HLD (hyperlipidemia) 09/20/2006  . OTITIS EXTERNA, CHRONIC NEC 12/28/2006  . PERSONAL HX COLONIC POLYPS 05/02/2009  . PSORIASIS 04/30/2008  . CKD (chronic  kidney disease) 04/30/2008    creatinine 2.2 range   Patient's usual warfarin dosage is reportedly 2mg  PO on Tues, Thurs, Sat; 2.5mg  PO on Mon, Wed, Fri, Sunday.  He has been maintained on this same dosage as inpatient.  Drug Interaction: Avelox.  Tolerating carb-modified diet.   Goal of Therapy:  INR 2-3   Assessment: -INR therapeutic but rising. -Warfarin dosage requirement may be lower than usual while on Avelox.   Plan:  -Reduce today's warfarin dosage to 1mg  PO. -Follow PT/INR daily while inpatient.  Elie Goody, Pharm.D.  [phone] 02/13/2011 11:02 AM

## 2011-02-13 NOTE — Progress Notes (Signed)
Pt. Left facility with his wife at his side at 1835 this day.  Discharge instructions/prescriptions given/explained with pt./spouse verbalizing understanding. Followup appts noted. Pt. Alert and oriented. Left on 2 l/min O2 with O2 in car from home.

## 2011-02-13 NOTE — Plan of Care (Signed)
Problem: Discharge Progression Outcomes Goal: Hemodynamically stable Outcome: Completed/Met Date Met:  02/13/11 Chronic O2 use of 4l/min via [place]

## 2011-02-16 ENCOUNTER — Ambulatory Visit (INDEPENDENT_AMBULATORY_CARE_PROVIDER_SITE_OTHER): Payer: Medicare Other | Admitting: *Deleted

## 2011-02-16 DIAGNOSIS — I4891 Unspecified atrial fibrillation: Secondary | ICD-10-CM

## 2011-02-16 DIAGNOSIS — I4892 Unspecified atrial flutter: Secondary | ICD-10-CM

## 2011-02-16 LAB — GLUCOSE, CAPILLARY
Glucose-Capillary: 412 mg/dL — ABNORMAL HIGH (ref 70–99)
Glucose-Capillary: 450 mg/dL — ABNORMAL HIGH (ref 70–99)

## 2011-02-20 ENCOUNTER — Other Ambulatory Visit: Payer: Self-pay | Admitting: Internal Medicine

## 2011-02-22 ENCOUNTER — Other Ambulatory Visit: Payer: Self-pay | Admitting: Internal Medicine

## 2011-02-23 ENCOUNTER — Telehealth: Payer: Self-pay | Admitting: Family Medicine

## 2011-02-23 ENCOUNTER — Other Ambulatory Visit: Payer: Self-pay | Admitting: Internal Medicine

## 2011-02-23 NOTE — Telephone Encounter (Signed)
Ok to RF med 
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
## 2011-02-24 ENCOUNTER — Telehealth: Payer: Self-pay | Admitting: Internal Medicine

## 2011-02-24 MED ORDER — POTASSIUM CHLORIDE CRYS ER 20 MEQ PO TBCR: 40.0000 meq | EXTENDED_RELEASE_TABLET | Freq: Every day | ORAL | Status: DC

## 2011-02-24 MED ORDER — TEMAZEPAM 30 MG PO CAPS: 30.0000 mg | ORAL_CAPSULE | Freq: Every evening | ORAL | Status: DC | PRN

## 2011-02-24 MED ORDER — TRAMADOL HCL 50 MG PO TABS: 50.0000 mg | ORAL_TABLET | Freq: Four times a day (QID) | ORAL | Status: DC | PRN

## 2011-02-24 MED ORDER — METOPROLOL SUCCINATE ER 50 MG PO TB24: 50.0000 mg | ORAL_TABLET | Freq: Two times a day (BID) | ORAL | Status: DC

## 2011-02-24 MED ORDER — FUROSEMIDE 80 MG PO TABS: 80.0000 mg | ORAL_TABLET | Freq: Two times a day (BID) | ORAL | Status: DC

## 2011-02-24 NOTE — Telephone Encounter (Signed)
Meds reordered to [facility]
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
## 2011-02-25 ENCOUNTER — Ambulatory Visit: Payer: Self-pay | Admitting: Pulmonary Disease

## 2011-02-27 ENCOUNTER — Ambulatory Visit (INDEPENDENT_AMBULATORY_CARE_PROVIDER_SITE_OTHER): Payer: Medicare Other | Admitting: Pulmonary Disease

## 2011-02-27 ENCOUNTER — Encounter: Payer: Self-pay | Admitting: Pulmonary Disease

## 2011-02-27 VITALS — BP 110/72 | HR 100 | Temp 97.6°F | Ht 66.0 in | Wt 196.0 lb

## 2011-02-27 DIAGNOSIS — J449 Chronic obstructive pulmonary disease, unspecified: Secondary | ICD-10-CM

## 2011-02-27 DIAGNOSIS — J9610 Chronic respiratory failure, unspecified whether with hypoxia or hypercapnia: Secondary | ICD-10-CM

## 2011-02-27 LAB — PULMONARY FUNCTION TEST

## 2011-02-27 NOTE — Progress Notes (Signed)
PFT done today. 

## 2011-02-27 NOTE — Progress Notes (Signed)
  Subjective:    Patient ID: [name], male    DOB: December 26, 1945, 65 y.o.   MRN: [id number]  HPI The patient comes in today for followup of his obstructive lung disease.  He has had PFTs today which show moderate airflow obstruction, mild restriction, and a severe decrease in his diffusion capacity.  He has been staying on his bronchodilator regimen, and has not had any recent pulmonary infection.  He has had a recent admission to the hospital for decompensated atrial fibrillation and congestive heart failure.  He tells me that he is to have what sounds like an ablation and pacemaker placement.  He denies any significant cough or mucus production.   Review of Systems  Constitutional: Positive for chills. Negative for fever and unexpected weight change.  HENT: Positive for nosebleeds and congestion. Negative for ear pain, sore throat, rhinorrhea, sneezing, trouble swallowing, dental problem, postnasal drip and sinus pressure.   Eyes: Negative for redness and itching.  Respiratory: Positive for chest tightness, shortness of breath and wheezing. Negative for cough.   Cardiovascular: Positive for leg swelling. Negative for palpitations.  Gastrointestinal: Negative for nausea and vomiting.  Genitourinary: Negative for dysuria.  Musculoskeletal: Negative for joint swelling.  Skin: Negative for rash.  Neurological: Negative for headaches.  Hematological: Bruises/bleeds easily.  Psychiatric/Behavioral: Negative for dysphoric mood. The patient is not nervous/anxious.        Objective:   Physical Exam Well-developed male in no acute distress Nose without purulence or discharge noted Chest with mild decrease in breath sounds, bibasilar crackles, no wheezes Heart exam with irregular rhythm and mildly increased rate Lower extremities with mild edema, no cyanosis noted Alert and oriented, moves all 4 extremities.       Assessment & Plan:

## 2011-02-27 NOTE — Assessment & Plan Note (Signed)
The patient has moderate airflow obstruction on his PFTs, most consistent with emphysema.  He is on a very good bronchodilator regimen, and is not smoking.  I suspect a lot of his issues are more related to his cardiac disease than his moderate lung disease.  I've asked the patient to continue on his current breathing medications, and to work on weight loss and conditioning.

## 2011-02-27 NOTE — Patient Instructions (Addendum)
Take advair one inhalation each am and pm, everyday.  Rinse mouth well Take spiriva one inhalation each am only, everyday Use albuterol inhaler as needed for emergencies only. followup with me in 6mos.

## 2011-03-05 ENCOUNTER — Other Ambulatory Visit: Payer: Self-pay | Admitting: Internal Medicine

## 2011-03-05 ENCOUNTER — Ambulatory Visit (INDEPENDENT_AMBULATORY_CARE_PROVIDER_SITE_OTHER): Payer: Medicare Other | Admitting: Internal Medicine

## 2011-03-05 ENCOUNTER — Encounter: Payer: Self-pay | Admitting: *Deleted

## 2011-03-05 ENCOUNTER — Encounter: Payer: Self-pay | Admitting: Internal Medicine

## 2011-03-05 DIAGNOSIS — I5042 Chronic combined systolic (congestive) and diastolic (congestive) heart failure: Secondary | ICD-10-CM

## 2011-03-05 DIAGNOSIS — I5023 Acute on chronic systolic (congestive) heart failure: Secondary | ICD-10-CM

## 2011-03-05 DIAGNOSIS — D68318 Other hemorrhagic disorder due to intrinsic circulating anticoagulants, antibodies, or inhibitors: Secondary | ICD-10-CM

## 2011-03-05 DIAGNOSIS — I4891 Unspecified atrial fibrillation: Secondary | ICD-10-CM

## 2011-03-05 MED ORDER — METOLAZONE 2.5 MG PO TABS: ORAL_TABLET | ORAL | Status: DC

## 2011-03-05 NOTE — Assessment & Plan Note (Signed)
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 Once again, I have discussed the treatment options with the patient. I recommended proceeding with AV nodal ablation and insertion of a Bi-ventricular pacemaker. The risks, goals, benefits, and expectations of the procedure have been discussed with the patient and he wishes to proceed.

## 2011-03-05 NOTE — Progress Notes (Signed)
HPI [name] returns today for followup. He is a 65 year old man with advanced congestive heart failure. He has chronic atrial fibrillation. He has been refractory to attempts at converting him to sinus rhythm. The patient has had no syncope. He has chronic peripheral edema. He has had multiple hospitalizations with worsening congestive heart failure. He also has a history of COPD. Intermittently, he takes home oxygen. Allergies  Allergen Reactions  . Aspirin     REACTION: Swelling, trouble breathing  . Tetracycline Hcl     REACTION: Swelling, trouble breathing     Current Outpatient Prescriptions  Medication Sig Dispense Refill  . albuterol (PROAIR HFA) 108 (90 BASE) MCG/ACT inhaler Inhale 2 puffs into the lungs every 6 (six) hours as needed.        Marland Kitchen co-enzyme Q-10 30 MG capsule Take 30 mg by mouth daily.        Marland Kitchen ezetimibe-simvastatin (VYTORIN) 10-40 MG per tablet Take 1 tablet by mouth at bedtime.        . Fluticasone-Salmeterol (ADVAIR DISKUS) 250-50 MCG/DOSE AEPB Inhale 1 puff into the lungs every 12 (twelve) hours.  60 each  4  . furosemide (LASIX) 80 MG tablet Take 1 tablet (80 mg total) by mouth 2 (two) times daily.  180 tablet  1  . insulin glargine (LANTUS) 100 UNIT/ML injection 30 units at bedtime  10 mL  3  . insulin lispro (HUMALOG) 100 UNIT/ML injection Inject 20 units before each meal - if blood sugars more than 200 add 8 units.  45 mL  6  . metoprolol (TOPROL-XL) 50 MG 24 hr tablet Take 1 tablet (50 mg total) by mouth 2 (two) times daily.  180 tablet  1  . Multiple Vitamin (MULTIVITAMIN) tablet Take 1 tablet by mouth daily.        Marland Kitchen Phenyleph-Chlorphen-Hydrocod (HYDROCODONE-PE-CHLORPHENIRAMIN PO) Take 5 mLs by mouth every 12 (twelve) hours.        . potassium chloride SA (K-DUR,KLOR-CON) 20 MEQ tablet Take 2 tablets (40 mEq total) by mouth daily.  90 tablet  1  . RABEprazole (ACIPHEX) 20 MG tablet Take 20 mg by mouth daily.        . temazepam (RESTORIL) 30 MG capsule Take 1  capsule (30 mg total) by mouth at bedtime as needed for sleep.  30 capsule  1  . tiotropium (SPIRIVA HANDIHALER) 18 MCG inhalation capsule Place 1 capsule (18 mcg total) into inhaler and inhale daily.  90 capsule  1  . traMADol (ULTRAM) 50 MG tablet Take 1 tablet (50 mg total) by mouth every 6 (six) hours as needed for pain. Maximum dose= 8 tablets per day  90 tablet  1  . warfarin (COUMADIN) 2 MG tablet Take 2 mg by mouth daily. Tuesday, Thursday and Saturday      . warfarin (COUMADIN) 5 MG tablet Take 2.5 mg by mouth as directed. Takes on Monday, Wednesday, Friday and Sunday.      . metolazone (ZAROXOLYN) 2.5 MG tablet Take one tablet by mouth on Mon/ Wed and Fri in the morning  30 tablet  3     Past Medical History  Diagnosis Date  . ALCOHOL ABUSE, HX OF 04/30/2008  . Atrial fibrillation 12/17/2006  . Atrial flutter 09/20/2006  . 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 Chronic systolic heart failure 09/20/2006    previous EF 25%;  Echocardiogram 12/10/10: Mild-moderate LVH, EF 50-55%, mild MR, moderate LAE, moderate RAE, mild RVE with moderately reduced RV SF, PASP 50, small pericardial effusion  .  COPD 12/17/2006  . CORONARY ARTERY DISEASE 09/20/2006    s/p stent to OM;  catheterization 12/06: Mid to distal LAD 30%, D2 20%, proximal circumflex 30%, OM 2 stents patent, ostial RCA 50%, mid RCA 40%, distal RCA multiple 40%  . 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 DYSPEPSIA&OTHER [facility] DISORDERS FUNCTION STOMACH 04/04/2009  . HYPERTENSION 12/17/2006  . OBESITY 04/30/2008  . HLD (hyperlipidemia) 09/20/2006  . OTITIS EXTERNA, CHRONIC NEC 12/28/2006  . PERSONAL HX COLONIC POLYPS 05/02/2009  . PSORIASIS 04/30/2008  . CKD (chronic kidney disease) 04/30/2008    creatinine 2.2 range    ROS:   All systems reviewed and negative except as noted in the HPI.   Past Surgical History  Procedure Date  . Cardiac defibrillator placement   . Angioplasty   .  Hernia repair     bilat  . Cardiac catheterization   . Coronary angioplasty     Stents placed  . Transthoracic echocardiogram 2005/2006/2008/2011     Family History  Problem Relation Age of Onset  . Lung cancer Mother     and aunt  . Heart attack Father   . Diabetes Sister     and aunt  . Colon cancer Neg Hx      History   Social History  . Marital Status: Married    Spouse Name: N/A    Number of Children: 0  . Years of Education: N/A   Occupational History  . [employer]    Social History Main Topics  . Smoking status: Former Smoker -- 1.0 packs/day for 50 years    Types: Cigarettes    Quit date: 03/23/2005  . Smokeless tobacco: Never Used  . Alcohol Use: No     quit drinking 2007  . Drug Use: No  . Sexually Active: Not on file   Other Topics Concern  . Not on file   Social History Narrative  . No narrative on file     BP 112/64  Pulse 103  Wt 89.812 kg (198 lb)  SpO2 94%  Physical Exam:  Obese, ill appearing NAD HEENT: Unremarkable Neck:  No JVD, no thyromegally Lymphatics:  No adenopathy Back:  No CVA tenderness Lungs:  Scattered rales and wheezes are present. He is minimally compensated. HEART:  IRegular rate rhythm, no murmurs, no rubs, no clicks Abd:  Obese, soft, positive bowel sounds, no organomegally, no rebound, no guarding Ext:  2 plus pulses, 2+ edema, no cyanosis, no clubbing Skin:  No rashes no nodules Neuro:  CN II through XII intact, motor grossly intact  EKG Atrial ablation with a rapid ventricular response and right bundle branch block.  Assess/Plan:

## 2011-03-05 NOTE — Patient Instructions (Signed)
Your physician has recommended that you have a pacemaker inserted. A pacemaker is a small device that is placed under the skin of your chest or abdomen to help control abnormal heart rhythms. This device uses electrical pulses to prompt the heart to beat at a normal rate. Pacemakers are used to treat heart rhythms that are too slow. Wire (leads) are attached to the pacemaker that goes into the chambers of you heart. This is done in the hospital and usually requires and overnight stay. Please see the instruction sheet given to you today for more information.  Your physician has recommended that you have an ablation. Catheter ablation is a medical procedure used to treat some cardiac arrhythmias (irregular heartbeats). During catheter ablation, a long, thin, flexible tube is put into a blood vessel in your groin (upper thigh), or neck. This tube is called an ablation catheter. It is then guided to your heart through the blood vessel. Radio frequency waves destroy small areas of heart tissue where abnormal heartbeats may cause an arrhythmia to start. Please see the instruction sheet given to you today.  Your physician has recommended you make the following change in your medication:  1) Start Zaroxlyn 2.5mg  Mon/ Wed/ Fri

## 2011-03-05 NOTE — Assessment & Plan Note (Signed)
The patient's symptoms are class III and he is barely compensated. Today in conjunction with his current medical therapy, I have asked the patient to reduce his sodium intake, and start taking Zaroxolyn 2.5 mg 3 times a week (Monday Wednesday and Friday).

## 2011-03-05 NOTE — Assessment & Plan Note (Signed)
Plan the patient to stop his anticoagulation several days prior to his procedure.

## 2011-03-06 ENCOUNTER — Encounter (HOSPITAL_COMMUNITY): Payer: Self-pay | Admitting: Pharmacy Technician

## 2011-03-09 ENCOUNTER — Encounter: Payer: Self-pay | Admitting: Pulmonary Disease

## 2011-03-10 ENCOUNTER — Ambulatory Visit: Payer: Self-pay | Admitting: Internal Medicine

## 2011-03-10 ENCOUNTER — Encounter: Payer: Medicare Other | Admitting: *Deleted

## 2011-03-13 ENCOUNTER — Other Ambulatory Visit: Payer: Self-pay | Admitting: Internal Medicine

## 2011-03-13 ENCOUNTER — Ambulatory Visit: Payer: Medicare Other | Admitting: *Deleted

## 2011-03-13 ENCOUNTER — Ambulatory Visit: Payer: Self-pay | Admitting: Internal Medicine

## 2011-03-13 ENCOUNTER — Encounter: Payer: Self-pay | Admitting: *Deleted

## 2011-03-13 ENCOUNTER — Ambulatory Visit (INDEPENDENT_AMBULATORY_CARE_PROVIDER_SITE_OTHER): Payer: Medicare Other | Admitting: *Deleted

## 2011-03-13 DIAGNOSIS — I4892 Unspecified atrial flutter: Secondary | ICD-10-CM

## 2011-03-13 DIAGNOSIS — I4891 Unspecified atrial fibrillation: Secondary | ICD-10-CM

## 2011-03-13 DIAGNOSIS — I5023 Acute on chronic systolic (congestive) heart failure: Secondary | ICD-10-CM

## 2011-03-13 LAB — DIFFERENTIAL
Basophils Absolute: 0 10*3/uL (ref 0.0–0.1)
Basophils Relative: 0 % (ref 0–1)
Eosinophils Absolute: 0.1 10*3/uL (ref 0.0–0.7)
Eosinophils Relative: 2 % (ref 0–5)
Lymphocytes Relative: 14 % (ref 12–46)

## 2011-03-13 LAB — BASIC METABOLIC PANEL
CO2: 40 mEq/L — ABNORMAL HIGH (ref 19–32)
Calcium: 8.4 mg/dL (ref 8.4–10.5)
Glucose, Bld: 191 mg/dL — ABNORMAL HIGH (ref 70–99)
Sodium: 133 mEq/L — ABNORMAL LOW (ref 135–145)

## 2011-03-13 LAB — CBC
Hemoglobin: 12.9 g/dL — ABNORMAL LOW (ref 13.0–17.0)
MCHC: 30.7 g/dL (ref 30.0–36.0)
Platelets: 240 10*3/uL (ref 150–400)
RDW: 16.9 % — ABNORMAL HIGH (ref 11.5–15.5)

## 2011-03-13 LAB — POCT INR: INR: 1.9

## 2011-03-16 ENCOUNTER — Ambulatory Visit (INDEPENDENT_AMBULATORY_CARE_PROVIDER_SITE_OTHER): Payer: Medicare Other | Admitting: Internal Medicine

## 2011-03-16 ENCOUNTER — Encounter: Payer: Self-pay | Admitting: Internal Medicine

## 2011-03-16 DIAGNOSIS — I4891 Unspecified atrial fibrillation: Secondary | ICD-10-CM

## 2011-03-16 DIAGNOSIS — I5032 Chronic diastolic (congestive) heart failure: Secondary | ICD-10-CM

## 2011-03-16 DIAGNOSIS — I2510 Atherosclerotic heart disease of native coronary artery without angina pectoris: Secondary | ICD-10-CM

## 2011-03-16 DIAGNOSIS — N259 Disorder resulting from impaired renal tubular function, unspecified: Secondary | ICD-10-CM

## 2011-03-16 DIAGNOSIS — I10 Essential (primary) hypertension: Secondary | ICD-10-CM

## 2011-03-16 DIAGNOSIS — E119 Type 2 diabetes mellitus without complications: Secondary | ICD-10-CM

## 2011-03-16 DIAGNOSIS — I509 Heart failure, unspecified: Secondary | ICD-10-CM

## 2011-03-16 MED ORDER — ALBUTEROL SULFATE HFA 108 (90 BASE) MCG/ACT IN AERS: 2.0000 | INHALATION_SPRAY | Freq: Four times a day (QID) | RESPIRATORY_TRACT | Status: AC | PRN

## 2011-03-16 NOTE — Patient Instructions (Signed)
Limit your sodium (Salt) intake  Cardiology followup as scheduled  Return office visit 2 months

## 2011-03-16 NOTE — Progress Notes (Signed)
  Subjective:    Patient ID: [name], male    DOB: 11-27-1945, 65 y.o.   MRN: [id number]  HPI   Wt Readings from Last 3 Encounters:  03/16/11 182 lb (82.555 kg)  03/05/11 198 lb (89.812 kg)  02/27/11 196 lb (88.48 kg)   65 year old patient who is seen today for followup. He has history of chronic congestive heart failure and has been seen by cardiology recently and Zaroxolyn added to his medical regimen. His weight and peripheral edema have responded dramatically and he feels quite well today; he did have electrolytes checked 3 days ago and is scheduled for cardiology followup in 2 days.  Due to the recurrent heart failure he is contemplating biventricular pacemaker/defibrillator in the near future. He states his diabetic control has been much improved and he is on basal and medial time insulin. His hemoglobin A1c last month 9.6. Diabetic management has been hampered by compliance issues related to cost considerations. He remains on chronic oxygen therapy   Review of Systems  Constitutional: Positive for fatigue. Negative for fever, chills and appetite change.  HENT: Negative for hearing loss, ear pain, congestion, sore throat, trouble swallowing, neck stiffness, dental problem, voice change and tinnitus.   Eyes: Negative for pain, discharge and visual disturbance.  Respiratory: Positive for shortness of breath. Negative for cough, chest tightness, wheezing and stridor.   Cardiovascular: Positive for leg swelling. Negative for chest pain and palpitations.  Gastrointestinal: Negative for nausea, vomiting, abdominal pain, diarrhea, constipation, blood in stool and abdominal distention.  Genitourinary: Negative for urgency, hematuria, flank pain, discharge, difficulty urinating and genital sores.  Musculoskeletal: Negative for myalgias, back pain, joint swelling, arthralgias and gait problem.  Skin: Negative for rash.  Neurological: Negative for dizziness, syncope, speech difficulty,  weakness, numbness and headaches.  Hematological: Negative for adenopathy. Does not bruise/bleed easily.  Psychiatric/Behavioral: Negative for behavioral problems and dysphoric mood. The patient is not nervous/anxious.        Objective:   Physical Exam  Constitutional: He is oriented to person, place, and time. He appears well-developed.       Nasal cannula oxygen in place. Blood pressure 110/68  HENT:  Head: Normocephalic.  Right Ear: External ear normal.  Left Ear: External ear normal.  Eyes: Conjunctivae and EOM are normal.  Neck: Normal range of motion.  Cardiovascular: Normal rate and normal heart sounds.        Controlled ventricular response  Pulmonary/Chest: Effort normal. He has rales.       Bibasilar rales and scattered rhonchi  O2 saturation after ambulation to the exam room 90% Oxygen saturation at rest 95%  Abdominal: Bowel sounds are normal.  Musculoskeletal: Normal range of motion. He exhibits edema. He exhibits no tenderness.       +2 edema right greater than left  Neurological: He is alert and oriented to person, place, and time.  Psychiatric: He has a normal mood and affect. His behavior is normal.          Assessment & Plan:   Chronic congestive heart failure. Brisk diuresis with addition of Zaroxolyn. Will be evaluated by cardiology in 2 days who  will consider dose reduction Diabetes mellitus. Probably improved with improved compliance Chronic atrial fibrillation Chronic Coumadin anticoagulation COPD  We'll recheck in 2 months. Samples dispensed. Will check a hemoglobin A1c at that time Followup cardiology Followup pulmonary medicine

## 2011-03-18 ENCOUNTER — Ambulatory Visit: Payer: Medicare Other | Admitting: Internal Medicine

## 2011-03-18 ENCOUNTER — Other Ambulatory Visit: Payer: Self-pay | Admitting: Internal Medicine

## 2011-03-18 ENCOUNTER — Ambulatory Visit (INDEPENDENT_AMBULATORY_CARE_PROVIDER_SITE_OTHER): Payer: Medicare Other | Admitting: *Deleted

## 2011-03-18 DIAGNOSIS — I4891 Unspecified atrial fibrillation: Secondary | ICD-10-CM

## 2011-03-18 DIAGNOSIS — I4892 Unspecified atrial flutter: Secondary | ICD-10-CM

## 2011-03-19 ENCOUNTER — Other Ambulatory Visit: Payer: Self-pay | Admitting: *Deleted

## 2011-03-19 DIAGNOSIS — I4891 Unspecified atrial fibrillation: Secondary | ICD-10-CM

## 2011-03-19 MED ORDER — HYDROCODONE-HOMATROPINE 5-1.5 MG/5ML PO SYRP: 5.0000 mL | ORAL_SOLUTION | Freq: Three times a day (TID) | ORAL | Status: DC | PRN

## 2011-03-19 MED ORDER — SODIUM CHLORIDE 0.9 % IR SOLN
80.0000 mg | Status: DC
  Filled 2011-03-19 (×2): qty 2

## 2011-03-19 MED ORDER — CEFAZOLIN SODIUM-DEXTROSE 2-3 GM-% IV SOLR
2.0000 g | INTRAVENOUS | Status: DC
  Filled 2011-03-19: qty 50

## 2011-03-19 NOTE — Telephone Encounter (Signed)
Hydromet 6 ounces 1 teaspoon  every 6 hours as needed for cough and congestion 

## 2011-03-19 NOTE — Telephone Encounter (Signed)
Please advise - i dont see it on current med list - hycodan on 12/29/10 in past med list -

## 2011-03-20 ENCOUNTER — Encounter (HOSPITAL_COMMUNITY)
Admission: RE | Disposition: A | Discharge status: Home / Self Care | Payer: Self-pay | Source: Ambulatory Visit | Attending: Internal Medicine

## 2011-03-20 ENCOUNTER — Ambulatory Visit (HOSPITAL_COMMUNITY)
Admission: RE | Admit: 2011-03-20 | Discharge: 2011-03-21 | Disposition: A | Discharge status: Home / Self Care | Payer: Medicare Other | Source: Ambulatory Visit | Attending: Internal Medicine | Admitting: Internal Medicine

## 2011-03-20 DIAGNOSIS — I129 Hypertensive chronic kidney disease with stage 1 through stage 4 chronic kidney disease, or unspecified chronic kidney disease: Secondary | ICD-10-CM | POA: Insufficient documentation

## 2011-03-20 DIAGNOSIS — I4891 Unspecified atrial fibrillation: Secondary | ICD-10-CM

## 2011-03-20 DIAGNOSIS — I509 Heart failure, unspecified: Secondary | ICD-10-CM

## 2011-03-20 DIAGNOSIS — J449 Chronic obstructive pulmonary disease, unspecified: Secondary | ICD-10-CM | POA: Insufficient documentation

## 2011-03-20 DIAGNOSIS — N259 Disorder resulting from impaired renal tubular function, unspecified: Secondary | ICD-10-CM

## 2011-03-20 DIAGNOSIS — I5022 Chronic systolic (congestive) heart failure: Secondary | ICD-10-CM | POA: Insufficient documentation

## 2011-03-20 DIAGNOSIS — I2510 Atherosclerotic heart disease of native coronary artery without angina pectoris: Secondary | ICD-10-CM | POA: Insufficient documentation

## 2011-03-20 DIAGNOSIS — N189 Chronic kidney disease, unspecified: Secondary | ICD-10-CM | POA: Insufficient documentation

## 2011-03-20 DIAGNOSIS — I5042 Chronic combined systolic (congestive) and diastolic (congestive) heart failure: Secondary | ICD-10-CM

## 2011-03-20 DIAGNOSIS — E1149 Type 2 diabetes mellitus with other diabetic neurological complication: Secondary | ICD-10-CM | POA: Insufficient documentation

## 2011-03-20 DIAGNOSIS — J4489 Other specified chronic obstructive pulmonary disease: Secondary | ICD-10-CM | POA: Insufficient documentation

## 2011-03-20 DIAGNOSIS — E1142 Type 2 diabetes mellitus with diabetic polyneuropathy: Secondary | ICD-10-CM | POA: Insufficient documentation

## 2011-03-20 DIAGNOSIS — I428 Other cardiomyopathies: Secondary | ICD-10-CM | POA: Insufficient documentation

## 2011-03-20 DIAGNOSIS — E669 Obesity, unspecified: Secondary | ICD-10-CM | POA: Insufficient documentation

## 2011-03-20 HISTORY — PX: PERMANENT PACEMAKER INSERTION: SHX5480

## 2011-03-20 HISTORY — PX: AV NODE ABLATION: SHX5458

## 2011-03-20 LAB — GLUCOSE, CAPILLARY: Glucose-Capillary: 133 mg/dL — ABNORMAL HIGH (ref 70–99)

## 2011-03-20 LAB — PROTIME-INR: Prothrombin Time: 25.5 seconds — ABNORMAL HIGH (ref 11.6–15.2)

## 2011-03-20 SURGERY — AV NODE ABLATION
Anesthesia: LOCAL

## 2011-03-20 MED ORDER — SODIUM CHLORIDE 0.9 % IV SOLN
INTRAVENOUS | Status: DC
  Administered 2011-03-20: 50 mL/h via INTRAVENOUS

## 2011-03-20 MED ORDER — TIOTROPIUM BROMIDE MONOHYDRATE 18 MCG IN CAPS
18.0000 ug | ORAL_CAPSULE | Freq: Every day | RESPIRATORY_TRACT | Status: DC
  Administered 2011-03-21: 18 ug via RESPIRATORY_TRACT
  Filled 2011-03-20: qty 5

## 2011-03-20 MED ORDER — METOLAZONE 2.5 MG PO TABS
2.5000 mg | ORAL_TABLET | Freq: Every day | ORAL | Status: DC
  Administered 2011-03-20 – 2011-03-21 (×2): 2.5 mg via ORAL
  Filled 2011-03-20 (×2): qty 1

## 2011-03-20 MED ORDER — METOPROLOL SUCCINATE ER 50 MG PO TB24
50.0000 mg | ORAL_TABLET | Freq: Two times a day (BID) | ORAL | Status: DC
  Administered 2011-03-20 – 2011-03-21 (×3): 50 mg via ORAL
  Filled 2011-03-20 (×5): qty 1

## 2011-03-20 MED ORDER — CHLORHEXIDINE GLUCONATE 4 % EX LIQD: 60.0000 mL | Freq: Once | CUTANEOUS | Status: DC

## 2011-03-20 MED ORDER — BUPIVACAINE HCL (PF) 0.25 % IJ SOLN
INTRAMUSCULAR | Status: AC
  Filled 2011-03-20: qty 30

## 2011-03-20 MED ORDER — HEPARIN (PORCINE) IN NACL 2-0.9 UNIT/ML-% IJ SOLN
INTRAMUSCULAR | Status: AC
  Filled 2011-03-20: qty 1000

## 2011-03-20 MED ORDER — SODIUM CHLORIDE 0.45 % IV SOLN: INTRAVENOUS | Status: DC

## 2011-03-20 MED ORDER — FUROSEMIDE 80 MG PO TABS
80.0000 mg | ORAL_TABLET | Freq: Two times a day (BID) | ORAL | Status: DC
  Administered 2011-03-20 – 2011-03-21 (×3): 80 mg via ORAL
  Filled 2011-03-20 (×4): qty 1

## 2011-03-20 MED ORDER — MIDAZOLAM HCL 5 MG/5ML IJ SOLN
INTRAMUSCULAR | Status: AC
  Filled 2011-03-20: qty 5

## 2011-03-20 MED ORDER — TRAMADOL HCL 50 MG PO TABS
50.0000 mg | ORAL_TABLET | Freq: Two times a day (BID) | ORAL | Status: DC | PRN
  Administered 2011-03-20: 50 mg via ORAL
  Filled 2011-03-20: qty 1

## 2011-03-20 MED ORDER — MUPIROCIN 2 % EX OINT
TOPICAL_OINTMENT | Freq: Once | CUTANEOUS | Status: AC
  Administered 2011-03-20: 1 via NASAL

## 2011-03-20 MED ORDER — PANTOPRAZOLE SODIUM 40 MG PO TBEC
40.0000 mg | DELAYED_RELEASE_TABLET | Freq: Every day | ORAL | Status: DC
  Administered 2011-03-20 – 2011-03-21 (×2): 40 mg via ORAL
  Filled 2011-03-20 (×2): qty 1

## 2011-03-20 MED ORDER — MUPIROCIN 2 % EX OINT
TOPICAL_OINTMENT | CUTANEOUS | Status: AC
  Filled 2011-03-20: qty 22

## 2011-03-20 MED ORDER — POTASSIUM CHLORIDE CRYS ER 20 MEQ PO TBCR
40.0000 meq | EXTENDED_RELEASE_TABLET | Freq: Every day | ORAL | Status: DC
  Administered 2011-03-20 – 2011-03-21 (×2): 40 meq via ORAL
  Filled 2011-03-20 (×2): qty 2

## 2011-03-20 MED ORDER — INSULIN GLARGINE 100 UNIT/ML ~~LOC~~ SOLN
30.0000 [IU] | Freq: Every day | SUBCUTANEOUS | Status: DC
  Filled 2011-03-20: qty 3

## 2011-03-20 MED ORDER — CEFAZOLIN SODIUM-DEXTROSE 2-3 GM-% IV SOLR
INTRAVENOUS | Status: AC
  Filled 2011-03-20: qty 50

## 2011-03-20 MED ORDER — CEFAZOLIN SODIUM 1-5 GM-% IV SOLN
1.0000 g | Freq: Four times a day (QID) | INTRAVENOUS | Status: AC
  Administered 2011-03-20 – 2011-03-21 (×3): 1 g via INTRAVENOUS
  Filled 2011-03-20 (×3): qty 50

## 2011-03-20 MED ORDER — LIDOCAINE HCL (PF) 1 % IJ SOLN
INTRAMUSCULAR | Status: AC
  Filled 2011-03-20: qty 60

## 2011-03-20 MED ORDER — WARFARIN SODIUM 2 MG PO TABS
2.0000 mg | ORAL_TABLET | ORAL | Status: DC
  Administered 2011-03-21: 2 mg via ORAL
  Filled 2011-03-20: qty 1

## 2011-03-20 MED ORDER — ONDANSETRON HCL 4 MG/2ML IJ SOLN: 4.0000 mg | Freq: Four times a day (QID) | INTRAMUSCULAR | Status: DC | PRN

## 2011-03-20 MED ORDER — TEMAZEPAM 15 MG PO CAPS: 30.0000 mg | ORAL_CAPSULE | Freq: Once | ORAL | Status: DC

## 2011-03-20 MED ORDER — ALBUTEROL SULFATE HFA 108 (90 BASE) MCG/ACT IN AERS
2.0000 | INHALATION_SPRAY | Freq: Four times a day (QID) | RESPIRATORY_TRACT | Status: DC | PRN
  Administered 2011-03-20 (×2): 2 via RESPIRATORY_TRACT
  Filled 2011-03-20: qty 6.7

## 2011-03-20 MED ORDER — TEMAZEPAM 15 MG PO CAPS
30.0000 mg | ORAL_CAPSULE | Freq: Once | ORAL | Status: AC
  Administered 2011-03-20: 30 mg via ORAL
  Filled 2011-03-20: qty 2

## 2011-03-20 MED ORDER — HYDROCODONE-HOMATROPINE 5-1.5 MG/5ML PO SYRP: 5.0000 mL | ORAL_SOLUTION | Freq: Three times a day (TID) | ORAL | Status: DC | PRN

## 2011-03-20 MED ORDER — FLUTICASONE-SALMETEROL 250-50 MCG/DOSE IN AEPB: 1.0000 | INHALATION_SPRAY | Freq: Two times a day (BID) | RESPIRATORY_TRACT | Status: DC

## 2011-03-20 MED ORDER — SODIUM CHLORIDE 0.9 % IV SOLN: 250.0000 mL | INTRAVENOUS | Status: AC | PRN

## 2011-03-20 MED ORDER — WARFARIN SODIUM 2.5 MG PO TABS
2.5000 mg | ORAL_TABLET | ORAL | Status: DC
  Administered 2011-03-20: 2.5 mg via ORAL
  Filled 2011-03-20: qty 1

## 2011-03-20 MED ORDER — FLUTICASONE-SALMETEROL 250-50 MCG/DOSE IN AEPB
1.0000 | INHALATION_SPRAY | Freq: Two times a day (BID) | RESPIRATORY_TRACT | Status: DC
  Administered 2011-03-20 – 2011-03-21 (×2): 1 via RESPIRATORY_TRACT
  Filled 2011-03-20: qty 14

## 2011-03-20 MED ORDER — EZETIMIBE-SIMVASTATIN 10-40 MG PO TABS
1.0000 | ORAL_TABLET | Freq: Every day | ORAL | Status: DC
  Administered 2011-03-20: 1 via ORAL
  Filled 2011-03-20 (×2): qty 1

## 2011-03-20 MED ORDER — FENTANYL CITRATE 0.05 MG/ML IJ SOLN
INTRAMUSCULAR | Status: AC
  Filled 2011-03-20: qty 2

## 2011-03-20 NOTE — Op Note (Signed)
BiV PPM insertion followed by AV node ablation without immediate complication. Q#[id number].

## 2011-03-20 NOTE — H&P (View-Only) (Signed)
HPI [name] returns today for followup. He is a 65-year-old man with advanced congestive heart failure. He has chronic atrial fibrillation. He has been refractory to attempts at converting him to sinus rhythm. The patient has had no syncope. He has chronic peripheral edema. He has had multiple hospitalizations with worsening congestive heart failure. He also has a history of COPD. Intermittently, he takes home oxygen. Allergies  Allergen Reactions  . Aspirin     REACTION: Swelling, trouble breathing  . Tetracycline Hcl     REACTION: Swelling, trouble breathing     Current Outpatient Prescriptions  Medication Sig Dispense Refill  . albuterol (PROAIR HFA) 108 (90 BASE) MCG/ACT inhaler Inhale 2 puffs into the lungs every 6 (six) hours as needed.        . co-enzyme Q-10 30 MG capsule Take 30 mg by mouth daily.        . ezetimibe-simvastatin (VYTORIN) 10-40 MG per tablet Take 1 tablet by mouth at bedtime.        . Fluticasone-Salmeterol (ADVAIR DISKUS) 250-50 MCG/DOSE AEPB Inhale 1 puff into the lungs every 12 (twelve) hours.  60 each  4  . furosemide (LASIX) 80 MG tablet Take 1 tablet (80 mg total) by mouth 2 (two) times daily.  180 tablet  1  . insulin glargine (LANTUS) 100 UNIT/ML injection 30 units at bedtime  10 mL  3  . insulin lispro (HUMALOG) 100 UNIT/ML injection Inject 20 units before each meal - if blood sugars more than 200 add 8 units.  45 mL  6  . metoprolol (TOPROL-XL) 50 MG 24 hr tablet Take 1 tablet (50 mg total) by mouth 2 (two) times daily.  180 tablet  1  . Multiple Vitamin (MULTIVITAMIN) tablet Take 1 tablet by mouth daily.        . Phenyleph-Chlorphen-Hydrocod (HYDROCODONE-PE-CHLORPHENIRAMIN PO) Take 5 mLs by mouth every 12 (twelve) hours.        . potassium chloride SA (K-DUR,KLOR-CON) 20 MEQ tablet Take 2 tablets (40 mEq total) by mouth daily.  90 tablet  1  . RABEprazole (ACIPHEX) 20 MG tablet Take 20 mg by mouth daily.        . temazepam (RESTORIL) 30 MG capsule Take 1  capsule (30 mg total) by mouth at bedtime as needed for sleep.  30 capsule  1  . tiotropium (SPIRIVA HANDIHALER) 18 MCG inhalation capsule Place 1 capsule (18 mcg total) into inhaler and inhale daily.  90 capsule  1  . traMADol (ULTRAM) 50 MG tablet Take 1 tablet (50 mg total) by mouth every 6 (six) hours as needed for pain. Maximum dose= 8 tablets per day  90 tablet  1  . warfarin (COUMADIN) 2 MG tablet Take 2 mg by mouth daily. Tuesday, Thursday and Saturday      . warfarin (COUMADIN) 5 MG tablet Take 2.5 mg by mouth as directed. Takes on Monday, Wednesday, Friday and Sunday.      . metolazone (ZAROXOLYN) 2.5 MG tablet Take one tablet by mouth on Mon/ Wed and Fri in the morning  30 tablet  3     Past Medical History  Diagnosis Date  . ALCOHOL ABUSE, HX OF 04/30/2008  . Atrial fibrillation 12/17/2006  . Atrial flutter 09/20/2006  . 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 Chronic systolic heart failure 09/20/2006    previous EF 25%;  Echocardiogram 12/10/10: Mild-moderate LVH, EF 50-55%, mild MR, moderate LAE, moderate RAE, mild RVE with moderately reduced RV SF, PASP 50, small pericardial effusion  .   COPD 12/17/2006  . CORONARY ARTERY DISEASE 09/20/2006    s/p stent to OM;  catheterization 12/06: Mid to distal LAD 30%, D2 20%, proximal circumflex 30%, OM 2 stents patent, ostial RCA 50%, mid RCA 40%, distal RCA multiple 40%  . 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 DYSPEPSIA&OTHER SPEC DISORDERS FUNCTION STOMACH 04/04/2009  . HYPERTENSION 12/17/2006  . OBESITY 04/30/2008  . HLD (hyperlipidemia) 09/20/2006  . OTITIS EXTERNA, CHRONIC NEC 12/28/2006  . PERSONAL HX COLONIC POLYPS 05/02/2009  . PSORIASIS 04/30/2008  . CKD (chronic kidney disease) 04/30/2008    creatinine 2.2 range    ROS:   All systems reviewed and negative except as noted in the HPI.   Past Surgical History  Procedure Date  . Cardiac defibrillator placement   . Angioplasty   .  Hernia repair     bilat  . Cardiac catheterization   . Coronary angioplasty     Stents placed  . Transthoracic echocardiogram 2005/2006/2008/2011     Family History  Problem Relation Age of Onset  . Lung cancer Mother     and aunt  . Heart attack Father   . Diabetes Sister     and aunt  . Colon cancer Neg Hx      History   Social History  . Marital Status: Married    Spouse Name: N/A    Number of Children: 0  . Years of Education: N/A   Occupational History  . Stocker    Social History Main Topics  . Smoking status: Former Smoker -- 1.0 packs/day for 50 years    Types: Cigarettes    Quit date: 03/23/2005  . Smokeless tobacco: Never Used  . Alcohol Use: No     quit drinking 2007  . Drug Use: No  . Sexually Active: Not on file   Other Topics Concern  . Not on file   Social History Narrative  . No narrative on file     BP 112/64  Pulse 103  Wt 89.812 kg (198 lb)  SpO2 94%  Physical Exam:  Obese, ill appearing NAD HEENT: Unremarkable Neck:  No JVD, no thyromegally Lymphatics:  No adenopathy Back:  No CVA tenderness Lungs:  Scattered rales and wheezes are present. He is minimally compensated. HEART:  IRegular rate rhythm, no murmurs, no rubs, no clicks Abd:  Obese, soft, positive bowel sounds, no organomegally, no rebound, no guarding Ext:  2 plus pulses, 2+ edema, no cyanosis, no clubbing Skin:  No rashes no nodules Neuro:  CN II through XII intact, motor grossly intact  EKG Atrial ablation with a rapid ventricular response and right bundle branch block.  Assess/Plan:   

## 2011-03-20 NOTE — Progress Notes (Signed)
INR Pending; Cath lab aware Jon Batten); pt to cath lab per request.

## 2011-03-20 NOTE — Progress Notes (Signed)
Frequent VS observed on telemon post pacer placement; HR ran 90-91 and SBP 120-140's.  BP's did not print when telemon was D/C'd. Baird Lyons 6:19 PM

## 2011-03-20 NOTE — Progress Notes (Signed)
BP 179/96, MD notified, no new orders received, will continue to monitor. Ave Filter

## 2011-03-20 NOTE — Interval H&P Note (Signed)
History and Physical Interval Note:  03/20/2011 7:04 AM  [name]  has presented today for surgery, with the diagnosis of heart failure/atrial fib  The various methods of treatment have been discussed with the patient and family. After consideration of risks, benefits and other options for treatment, the patient has consented to  Procedure(s): AV NODE ABLATION PERMANENT PACEMAKER INSERTION as a surgical intervention .  The patients' history has been reviewed, patient examined, no change in status, stable for surgery.  I have reviewed the patients' chart and labs.  Questions were answered to the patient's satisfaction.     Lewayne Bunting

## 2011-03-21 ENCOUNTER — Ambulatory Visit (HOSPITAL_COMMUNITY): Payer: Medicare Other

## 2011-03-21 ENCOUNTER — Other Ambulatory Visit: Payer: Self-pay

## 2011-03-21 LAB — BASIC METABOLIC PANEL
CO2: 34 mEq/L — ABNORMAL HIGH (ref 19–32)
Calcium: 8.4 mg/dL (ref 8.4–10.5)
Chloride: 93 mEq/L — ABNORMAL LOW (ref 96–112)
Potassium: 4.4 mEq/L (ref 3.5–5.1)
Sodium: 135 mEq/L (ref 135–145)

## 2011-03-21 LAB — GLUCOSE, CAPILLARY
Glucose-Capillary: 160 mg/dL — ABNORMAL HIGH (ref 70–99)
Glucose-Capillary: 207 mg/dL — ABNORMAL HIGH (ref 70–99)
Glucose-Capillary: 190 mg/dL — ABNORMAL HIGH (ref 70–99)

## 2011-03-21 LAB — PRO B NATRIURETIC PEPTIDE: Pro B Natriuretic peptide (BNP): 5339 pg/mL — ABNORMAL HIGH (ref 0–125)

## 2011-03-21 MED ORDER — INSULIN ASPART 100 UNIT/ML ~~LOC~~ SOLN
0.0000 [IU] | Freq: Three times a day (TID) | SUBCUTANEOUS | Status: DC
  Filled 2011-03-21: qty 3

## 2011-03-21 MED ORDER — METOLAZONE 2.5 MG PO TABS: 2.5000 mg | ORAL_TABLET | Freq: Every day | ORAL | Status: DC

## 2011-03-21 MED ORDER — LISINOPRIL 2.5 MG PO TABS
2.5000 mg | ORAL_TABLET | Freq: Every day | ORAL | Status: DC
  Administered 2011-03-21: 2.5 mg via ORAL
  Filled 2011-03-21: qty 1

## 2011-03-21 MED ORDER — HYDROCODONE-ACETAMINOPHEN 5-500 MG PO TABS: 1.0000 | ORAL_TABLET | Freq: Three times a day (TID) | ORAL | Status: DC | PRN

## 2011-03-21 MED ORDER — LISINOPRIL 2.5 MG PO TABS: 2.5000 mg | ORAL_TABLET | Freq: Every day | ORAL | Status: DC

## 2011-03-21 MED ORDER — INSULIN ASPART 100 UNIT/ML ~~LOC~~ SOLN
0.0000 [IU] | Freq: Three times a day (TID) | SUBCUTANEOUS | Status: DC
  Administered 2011-03-21: 4 [IU] via SUBCUTANEOUS
  Filled 2011-03-21: qty 3

## 2011-03-21 MED ORDER — GUAIFENESIN-DM 100-10 MG/5ML PO SYRP: 15.0000 mL | ORAL_SOLUTION | ORAL | Status: DC | PRN

## 2011-03-21 NOTE — Op Note (Signed)
NAMEASON, HESLIN                ACCOUNT NO.:  [id number]  MEDICAL RECORD NO.:  [id number]  LOCATION:  2022                         FACILITY:  [facility]  PHYSICIAN:  Doylene Canning. [name], MD    DATE OF BIRTH:  03-05-1946  DATE OF PROCEDURE:  03/20/2011 DATE OF DISCHARGE:                              OPERATIVE REPORT   PROCEDURE PERFORMED:  Insertion of a biventricular pacemaker followed by AV node ablation.  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  INTRODUCTION:  The patient is a very pleasant 65 year old male with COPD and nonischemic cardiomyopathy.  His left ventricular function has over the years gradually decreased down to an EF of 35-40%.  The patient has had progressive heart failure and worsening symptoms.  He is now referred for a Bi-V pacemaker implantation followed by AV node ablation.  PROCEDURE:  After informed consent was obtained, the patient was taken to the diagnostic EP lab in a fasting state.  After usual preparation and draping, intravenous fentanyl and midazolam was given for sedation. A 30 mL of lidocaine was infiltrated into the left infraclavicular region.  A 6-cm incision was carried out over this region, and electrocautery was utilized to dissect down to the fascial plane.  The left subclavian vein was punctured x2, and the St. Jude model 2088T 58- cm active fixation pacing lead, serial number [id number] was advanced into the right ventricle.  Mapping was carried out in the final site on the RV septum.  R-waves measured 17 mV, the pacing impedance was 600 ohms, and the threshold was a V at 0.5 msec.  There was large injury current with active fixation of the lead.  With the right ventricular lead in satisfactory position, attention was then turned to placement of the LV lead.  A guiding catheter was advanced by way of the left subclavian vein into the right atrium.  The coronary sinus was cannulated  utilizing a 6-French hexapolar EP catheter.  Venography of the coronary sinus was carried out.  This demonstrated a very tortuous lateral vein and a posterior vein.  The lateral vein was chosen for LV lead placement.  A subselective catheter was utilized to assist with cannulation of the lateral vein which was carried out with modest difficulty.  The venogram was shot in the vein.  The St. Jude Quick Flex, model 1258T bipolar pacing lead, serial number [id number] was advanced into the lateral vein.  The guiding catheter was removed in the usual manner.  The pacing threshold was around a V at 0.5 msec, and there was no diaphragmatic stimulation at 5 V.  There was intermittent diaphragmatic stimulation at 7 V.  With these satisfactory parameters, the lead was secured to the subpectoral fascia with a figure-of-eight silk suture.  Sewing sleeve was secured with silk suture. Electrocautery was utilized to make a subcutaneous pocket.  Antibiotic irrigation was utilized to irrigate the pocket, and electrocautery was utilized to assure hemostasis.  The St. Jude Anthem RF, Bi-V pacemaker, serial number [id number] was connected to the RV and the LV leads and placed back in the subcutaneous pocket, and the patient's incision was closed with 2-0 and 3-0  Vicryl.  Benzoin and Steri-Strips were painted on the skin, and the patient was prepped for AV node ablation.  After the patient was additionally sedated, a 7-French quadripolar ablation catheter was advanced into the right femoral vein and into the right atrium.  Mapping was carried out in the His bundle region.  The HV interval was 40 msec.  A single RF energy application was subsequently delivered to the region of the AV node resulting in immediate creation of complete heart block.  The patient was observed for approximately 30 minutes and during this time, there was no recurrent AV node conduction. The catheter was removed,  hemostasis was assured,  and the patient was returned to his room in satisfactory condition.  COMPLICATIONS:  There were no immediate procedure complications.  RESULTS:  This demonstrates successful insertion of a Bi-V pacemaker, followed by successful AV node ablation in a patient with atrial fibrillation and a tachycardia-induced cardiomyopathy  despite medical therapy.     Doylene Canning. [name], MD     GWT/MEDQ  D:  03/20/2011  T:  03/21/2011  Job:  [id number]

## 2011-03-21 NOTE — Progress Notes (Signed)
Pt. Discharged 03/21/2011  4:50 PM Discharge instructions reviewed with patient/family. Patient/family verbalized understanding. All Rx's given. Questions answered as needed. Pt. Discharged to home with family/self. Taken off unit via W/C. Markanthony Gedney, [employer]

## 2011-03-21 NOTE — Progress Notes (Signed)
[name]  64 y.o.  male  Subjective: No complaints.  Edema about at baseline.  Has had a cough prior to admit.  Does not weigh at home.  Allergy: Aspirin and Tetracycline hcl  Objective: Vital signs in last 24 hours: Temp:  [97.7 F (36.5 C)-97.9 F (36.6 C)] 97.7 F (36.5 C) (12/29 0511) Pulse Rate:  [90-91] 90  (12/29 0954) Resp:  [19-22] 19  (12/29 0511) BP: (120-179)/(73-101) 120/76 mmHg (12/29 0954) SpO2:  [92 %-100 %] 92 % (12/29 0746) FiO2 (%):  [97 %-99 %] 97 % (12/28 1407) Weight:  [79.652 kg (175 lb 9.6 oz)] 175 lb 9.6 oz (79.652 kg) (12/29 0511)  175 lb 9.6 oz (79.652 kg) Body mass index is 28.34 kg/(m^2).  Weight change: -1.542 kg (-3 lb 6.4 oz) Last BM Date: 03/19/11  Intake/Output from previous day: 12/28 0701 - 12/29 0700 In: 1004.2 [P.O.:240; I.V.:614.2; IV Piggyback:150] Out: 500 [Urine:500]  General- Well developed; no acute distress Neck- No JVD, no carotid bruits Lungs- decreased BS; few left basilar rales; prolonged I:E ratio.  Pacer insertion site benign. Cardiovascular- normal PMI; distant S1 and S2 Abdomen- normal bowel sounds; soft and non-tender without masses or organomegaly Skin- Warm, no significant lesions Extremities- Nl distal pulses; 2+ edema with chronic stasis changes  Imaging Studies/Results: Dg Chest 2 View  03/21/2011  *RADIOLOGY REPORT*  Clinical Data: Status post pacemaker placement.  IMPRESSION:  1.  Interval pacemaker placed without demonstrated complication. 2.  Persistent mild interstitial edema with improved basilar aeration and pleural effusions.  Original Report Authenticated By: Gerrianne Scale, M.D.    Imaging: Imaging results have been reviewed  Medications: I have reviewed the patient's current medications.   Assessment/Plan: Doing well following pacer implant.  I/O +700 cc, but weight decreased both in hospital and by 25 lbs over the past month.  Most recent BNP level 8000 in 01/2011.  Will check level today and  increase metolazone to daily-had been using 3 times per week.  Add ACE inhibitor.  Also a candidate for spironolactone.  LV dysfunction may be tachycardia-mediated.  Repeat echo in 1 month.  Follow closely as outpt.  OK for discharge.  LOS: 1 day   [name] 03/21/2011, 12:46 PM

## 2011-03-21 NOTE — Discharge Summary (Signed)
Discharge Summary   Patient ID: [name] MRN: [id number], DOB/AGE: 06-05-1945 65 y.o. Admit date: 03/20/2011 D/C date:     03/21/2011    Primary Cardiologist:  Dr. Lewayne Bunting  Primary Electrophysiologist:  Dr. Lewayne Bunting   Reason for Admission:  AV nodal ablation with insertion of BiV pacer  Primary Discharge Diagnoses:   1.  Chronic Systolic CHF      S/p BiV pacer insertion this admission      Metolazone increased to daily administration this admission and Lisinopril added this admission 2.  Dilated Cardiomyopathy 3.  Permanent Atrial Fibrillation      S/p AV nodal ablation this admission  Secondary Discharge Diagnoses:   Past Medical History  Diagnosis Date  . ALCOHOL ABUSE, HX OF 04/30/2008  . Atrial fibrillation 12/17/2006  . Atrial flutter 09/20/2006  . 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 Chronic systolic heart failure 09/20/2006    previous EF 25%;  Echocardiogram 12/10/10: Mild-moderate LVH, EF 50-55%, mild MR, moderate LAE, moderate RAE, mild RVE with moderately reduced RV SF, PASP 50, small pericardial effusion  . COPD 12/17/2006  . CORONARY ARTERY DISEASE 09/20/2006    s/p stent to OM;  catheterization 12/06: Mid to distal LAD 30%, D2 20%, proximal circumflex 30%, OM 2 stents patent, ostial RCA 50%, mid RCA 40%, distal RCA multiple 40%  . 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 DYSPEPSIA&OTHER [facility] DISORDERS FUNCTION STOMACH 04/04/2009  . HYPERTENSION 12/17/2006  . OBESITY 04/30/2008  . HLD (hyperlipidemia) 09/20/2006  . OTITIS EXTERNA, CHRONIC NEC 12/28/2006  . PERSONAL HX COLONIC POLYPS 05/02/2009  . PSORIASIS 04/30/2008  . CKD (chronic kidney disease) 04/30/2008    creatinine 2.2 range  1.    Procedures Performed This Admission:   Insertion of a biventricular pacemaker followed by  AV node ablation with Dr. Lewayne Bunting 03/20/11   Hospital Course:  [name] is a 65 y.o. male who has a history of  ischemic cardiomyopathy, systolic heart failure and permanent atrial fibrillation with difficult to control heart rates. He was seen recently by Dr. [name]. The plan was to proceed with admission to the hospital for biventricular pacemaker implantation and subsequent AV nodal ablation to 2 difficult to control heart rates. The procedure was performed on 12/28. There were no immediate complications. He was evaluated by Dr. Dietrich Pates on the morning of discharge. He was noted to have an elevated BNP at 5339. He was felt to need increased therapy for volume management. Therefore, Dr. Dietrich Pates increased his metolazone to once daily dosing instead of 3 times a week. Dr. Dietrich Pates also recommended starting lisinopril to his medical regimen. It was recommended that the patient have a followup echocardiogram in 4 weeks as his cardiomyopathy may be tachycardia mediated. The patient will need close followup on his renal function and potassium with his history of CKD.  He will come back for usual followup with electrophysiology after device implant. He will be brought back in followup to see the PA for evaluation of his CHF. He was deemed stable for discharge to home by Dr. Dietrich Pates.   Coumadin is restarted. He will be brought back next week for INR check.  Discharge Vitals: Blood pressure 110/76, pulse 90, temperature 98.4 F (36.9 C), temperature source Oral, resp. rate 19, height 5\' 6"  (1.676 m), weight 175 lb 9.6 oz (79.652 kg), SpO2 97.00%.  Labs: Lab Results  Component Value Date   WBC 5.3 03/13/2011   HGB 12.9*  03/13/2011   HCT 42.0 03/13/2011   MCV 85.2 03/13/2011   PLT 240 03/13/2011    Lab 03/21/11 1330  NA 135  K 4.4  CL 93*  CO2 34*  BUN 31*  CREATININE 1.98*  CALCIUM 8.4  PROT --  BILITOT --  ALKPHOS --  ALT --  AST --  GLUCOSE 174*   No results found for this basename: CKTOTAL:4,CKMB:4,TROPONINI:4 in the last 72 hours Lab Results  Component Value Date   CHOL 120 02/08/2011   HDL 43  02/08/2011   LDLCALC 57 02/08/2011   TRIG 99 02/08/2011   No results found for this basename: DDIMER   Lab Results  Component Value Date   TSH 3.180 02/07/2011    Diagnostic Studies/Procedures:  Dg Chest 2 View  03/21/2011  *RADIOLOGY REPORT*  Clinical Data: Status post pacemaker placement.  CHEST - 2 VIEW  Comparison: 02/12/2011 radiographs.  Findings: There is a new left subclavian pacemaker with leads in the right ventricle and coronary sinus.  No pneumothorax is demonstrated.  There is stable cardiomegaly with probable mild interstitial edema.  Small pleural effusions have improved compared with the prior study.  No acute osseous findings are evident.  IMPRESSION:  1.  Interval pacemaker placed without demonstrated complication. 2.  Persistent mild interstitial edema with improved basilar aeration and pleural effusions.  Original Report Authenticated By: Gerrianne Scale, M.D.    Discharge Medications   Current Discharge Medication List    START taking these medications   Details  lisinopril (PRINIVIL,ZESTRIL) 2.5 MG tablet Take 1 tablet (2.5 mg total) by mouth daily. Qty: 30 tablet, Refills: 5      CONTINUE these medications which have CHANGED   Details  metolazone (ZAROXOLYN) 2.5 MG tablet Take 1 tablet (2.5 mg total) by mouth daily. Qty: 30 tablet, Refills: 5      CONTINUE these medications which have NOT CHANGED   Details  albuterol (PROAIR HFA) 108 (90 BASE) MCG/ACT inhaler Inhale 2 puffs into the lungs every 6 (six) hours as needed. For shortness of breath Qty: 1 Inhaler, Refills: 6    co-enzyme Q-10 30 MG capsule Take 30 mg by mouth daily.      ezetimibe-simvastatin (VYTORIN) 10-40 MG per tablet Take 1 tablet by mouth at bedtime.      Fluticasone-Salmeterol (ADVAIR DISKUS) 250-50 MCG/DOSE AEPB Inhale 1 puff into the lungs every 12 (twelve) hours. Qty: 60 each, Refills: 4    furosemide (LASIX) 80 MG tablet Take 80 mg by mouth 2 (two) times daily.        HYDROcodone-homatropine (HYCODAN) 5-1.5 MG/5ML syrup Take 5 mLs by mouth every 8 (eight) hours as needed for cough. Qty: 120 mL, Refills: 0    insulin glargine (LANTUS) 100 UNIT/ML injection Inject 30 Units into the skin at bedtime. 30 units at bedtime     insulin lispro (HUMALOG) 100 UNIT/ML injection 20 Units 3 (three) times daily before meals. Inject 20 units before each meal - if blood sugars more than 200 add 8 units.     metoprolol (TOPROL-XL) 50 MG 24 hr tablet Take 50 mg by mouth 2 (two) times daily.      Multiple Vitamin (MULTIVITAMIN) tablet Take 1 tablet by mouth daily.      potassium chloride SA (K-DUR,KLOR-CON) 20 MEQ tablet Take 40 mEq by mouth daily.      RABEprazole (ACIPHEX) 20 MG tablet Take 20 mg by mouth daily.      temazepam (RESTORIL) 30 MG capsule Take 30  mg by mouth at bedtime as needed.      tiotropium (SPIRIVA HANDIHALER) 18 MCG inhalation capsule Place 1 capsule (18 mcg total) into inhaler and inhale daily. Qty: 90 capsule, Refills: 1    traMADol (ULTRAM) 50 MG tablet Take 50 mg by mouth every 6 (six) hours as needed. Maximum dose= 8 tablets per day     !! warfarin (COUMADIN) 2 MG tablet Take 2 mg by mouth daily. Tuesday, Thursday and Saturday    !! warfarin (COUMADIN) 5 MG tablet Take 2.5 mg by mouth as directed. Takes on Monday, Wednesday, Friday and Sunday.     !! - Potential duplicate medications found. Please discuss with provider.      Disposition   The patient will be discharged in stable condition to home. Discharge Orders    Future Appointments: Provider: Department: Dept Phone: Center:   03/27/2011 2:45 PM Tiffany J Muse, RN [facility] [phone] None   05/18/2011 2:00 PM Peter Frank Kwiatkowski [facility] [phone] [facility]   08/28/2011 1:30 PM Keith M [name], MD [facility] [phone] None     Future Orders Please Complete By Expires   Diet - low sodium heart healthy      Diet Carb Modified      Increase activity  slowly      Discharge wound care:      Comments:   See pre printed form.   (HEART FAILURE PATIENTS) Call MD:  Anytime you have any of the following symptoms: 1) 3 pound weight gain in 24 hours or 5 pounds in 1 week 2) shortness of breath, with or without a dry hacking cough 3) swelling in the hands, feet or stomach 4) if you have to sleep on extra pillows at night in order to breathe.      Other Restrictions      Comments:   See pre printed form.     Follow-up Information    Follow up with Gregg Taylor, MD in 1 week. (to see nurse for wound check)    Contact information:   [address] [place] [address] [phone]       Follow up with Gregg Taylor, MD in 3 months. (office will call)    Contact information:   [address] [place] [address] [phone]       Follow up with  , PA in 3 weeks. (to follow up on CHF - office will call)    Contact information:   [address] [place] [address] [phone]       Follow up with [facility] in 1 week. (office will call)    Contact information:   [address] [place]  [address]       Follow up with  [facility] (Lab visit for:  BMET; office will call)    Contact information:   [address]   [id number]            Duration of Discharge Encounter: Greater than 30 minutes including physician and PA time.  Signed,  , PA-C  3:47 PM 03/21/2011        [address] [place], [place]  [address] Phone: [phone] Fax:  [phone]

## 2011-03-26 ENCOUNTER — Telehealth: Payer: Self-pay | Admitting: Internal Medicine

## 2011-03-26 ENCOUNTER — Encounter: Payer: Medicare Other | Admitting: *Deleted

## 2011-03-26 ENCOUNTER — Other Ambulatory Visit: Payer: Medicare Other | Admitting: *Deleted

## 2011-03-26 ENCOUNTER — Other Ambulatory Visit: Payer: Self-pay | Admitting: *Deleted

## 2011-03-26 DIAGNOSIS — I10 Essential (primary) hypertension: Secondary | ICD-10-CM

## 2011-03-26 DIAGNOSIS — I5042 Chronic combined systolic (congestive) and diastolic (congestive) heart failure: Secondary | ICD-10-CM

## 2011-03-26 NOTE — Telephone Encounter (Signed)
Pt decline  ov tomorrow. Pt has appt  03-27-11 with dr [name] to check his pacemaker. This call was sent to cindy to triaged

## 2011-03-26 NOTE — Telephone Encounter (Signed)
Pt wife is concerned about husband and is requesting you contact her said he is coughing and very very sick.

## 2011-03-26 NOTE — Telephone Encounter (Signed)
Urged pt to go to the Urgent Care and pt's wife said he would not do it.  Pt was d/c from hospital last week with a pacemaker and he has a cough.  Offered appt for tomorrow and pt declined that too.

## 2011-03-27 ENCOUNTER — Ambulatory Visit (INDEPENDENT_AMBULATORY_CARE_PROVIDER_SITE_OTHER): Payer: Medicare Other | Admitting: *Deleted

## 2011-03-27 DIAGNOSIS — I4891 Unspecified atrial fibrillation: Secondary | ICD-10-CM

## 2011-03-27 DIAGNOSIS — I4892 Unspecified atrial flutter: Secondary | ICD-10-CM

## 2011-03-27 LAB — POCT INR: INR: 2.3

## 2011-03-28 ENCOUNTER — Ambulatory Visit (INDEPENDENT_AMBULATORY_CARE_PROVIDER_SITE_OTHER): Payer: Medicare Other | Admitting: Internal Medicine

## 2011-03-28 ENCOUNTER — Other Ambulatory Visit: Payer: Self-pay | Admitting: Internal Medicine

## 2011-03-28 VITALS — BP 112/60 | HR 85 | Temp 97.7°F | Wt 177.0 lb

## 2011-03-28 DIAGNOSIS — J441 Chronic obstructive pulmonary disease with (acute) exacerbation: Secondary | ICD-10-CM

## 2011-03-28 DIAGNOSIS — J449 Chronic obstructive pulmonary disease, unspecified: Secondary | ICD-10-CM

## 2011-03-28 MED ORDER — PREDNISONE 10 MG PO TABS: 10.0000 mg | ORAL_TABLET | Freq: Every day | ORAL | Status: DC

## 2011-03-28 MED ORDER — SULFAMETHOXAZOLE-TRIMETHOPRIM 800-160 MG PO TABS: 1.0000 | ORAL_TABLET | Freq: Two times a day (BID) | ORAL | Status: DC

## 2011-03-28 NOTE — Progress Notes (Signed)
  Subjective:    Patient ID: [name], male    DOB: December 04, 1945, 66 y.o.   MRN: [id number]  HPI [name] was released from hospital s/p PTVDP. He developed cough and cold and congestion. No documented fever. He has increased SOB above his baseline of COPD/Emphysema O2 dependent. He feels bad. NO chest pain, no irregular heart beats. No diarrhea.   Complex medical history - reviewed problem list and med list.    Review of Systems System review is negative for any constitutional, cardiac, pulmonary, GI or neuro symptoms or complaints other than as described in the HPI.     Objective:   Physical Exam Filed Vitals:   03/28/11 1133  BP: 112/60  Pulse: 85  Temp: 97.7 F (36.5 C)  Pulse oximetry on room air is 91% Gen'l- ill-kempt white man in minor respiratory distress HEENT- missing most of his teeth Neck- supple Pulm - no increased work of breathing. Coarse rhonchi, may be obscuring rales, diffuse wheezing. Cor - RRR          Assessment & Plan:  Bronchitic exacerbation of COPD - no severe distress. Plan - Septra DS bid x 10 days           Prednisone burst and taper           Cough preparation with guaifenesin and dextromethorphan          Follow-up with Dr. Shelle Iron if not better  Advised to reapply for disability and encouraged them to get [place] part [place] coverage

## 2011-03-28 NOTE — Assessment & Plan Note (Addendum)
Bronchitic exacerbation of COPD with rhonchi and wheezing.  Plan - Septra DS bid x 10 (avelox beyond their budget)           Prednisone burst and taper.            Continue meds

## 2011-03-28 NOTE — Patient Instructions (Signed)
Bronchitis exacerbation of COPD: plan - septra DS twice a day (generic antibiotic) x 10 days; prednisone burst and taper for the wheezing; for cough generic robitussin DM (anything with guafenascin and dextromethorophan); hydrate; tylenol as needed.

## 2011-03-30 ENCOUNTER — Ambulatory Visit (INDEPENDENT_AMBULATORY_CARE_PROVIDER_SITE_OTHER): Payer: Medicare Other | Admitting: *Deleted

## 2011-03-30 ENCOUNTER — Encounter: Payer: Self-pay | Admitting: Internal Medicine

## 2011-03-30 DIAGNOSIS — I5042 Chronic combined systolic (congestive) and diastolic (congestive) heart failure: Secondary | ICD-10-CM

## 2011-03-30 DIAGNOSIS — I4891 Unspecified atrial fibrillation: Secondary | ICD-10-CM

## 2011-03-30 DIAGNOSIS — I4892 Unspecified atrial flutter: Secondary | ICD-10-CM

## 2011-03-30 LAB — PACEMAKER DEVICE OBSERVATION
DEVICE MODEL PM: 2707786
RV LEAD IMPEDENCE PM: 512.5 Ohm

## 2011-04-02 ENCOUNTER — Telehealth: Payer: Self-pay | Admitting: Pulmonary Disease

## 2011-04-02 NOTE — Telephone Encounter (Signed)
I spoke with Bonita Quin and she states pt is needing albuterol samples. I advised Bonita Quin we did not have any samples. She states pt did not need rx. NOthing further was needed

## 2011-04-07 ENCOUNTER — Encounter: Payer: Self-pay | Admitting: Physician Assistant

## 2011-04-07 ENCOUNTER — Ambulatory Visit (INDEPENDENT_AMBULATORY_CARE_PROVIDER_SITE_OTHER): Payer: Medicare Other | Admitting: Physician Assistant

## 2011-04-07 ENCOUNTER — Ambulatory Visit (INDEPENDENT_AMBULATORY_CARE_PROVIDER_SITE_OTHER): Payer: Medicare Other | Admitting: *Deleted

## 2011-04-07 DIAGNOSIS — I4891 Unspecified atrial fibrillation: Secondary | ICD-10-CM

## 2011-04-07 DIAGNOSIS — N259 Disorder resulting from impaired renal tubular function, unspecified: Secondary | ICD-10-CM

## 2011-04-07 DIAGNOSIS — I4892 Unspecified atrial flutter: Secondary | ICD-10-CM

## 2011-04-07 DIAGNOSIS — I5042 Chronic combined systolic (congestive) and diastolic (congestive) heart failure: Secondary | ICD-10-CM

## 2011-04-07 DIAGNOSIS — R0600 Dyspnea, unspecified: Secondary | ICD-10-CM

## 2011-04-07 DIAGNOSIS — R0609 Other forms of dyspnea: Secondary | ICD-10-CM

## 2011-04-07 DIAGNOSIS — I5022 Chronic systolic (congestive) heart failure: Secondary | ICD-10-CM

## 2011-04-07 LAB — BASIC METABOLIC PANEL
CO2: 32 mEq/L (ref 19–32)
Calcium: 8.8 mg/dL (ref 8.4–10.5)
GFR: 24.48 mL/min — ABNORMAL LOW (ref >60.00)
Potassium: 4.9 mEq/L (ref 3.5–5.1)
Sodium: 137 mEq/L (ref 135–145)

## 2011-04-07 LAB — POCT INR: INR: 3.4

## 2011-04-07 MED ORDER — METOLAZONE 2.5 MG PO TABS: ORAL_TABLET | ORAL | Status: DC

## 2011-04-07 NOTE — Patient Instructions (Addendum)
Your physician recommends that you schedule a follow-up appointment in: as scheduled Your physician has recommended you make the following change in your medication: RESTART Metolazone 3x's a week Your physician recommends that you return for lab work in: today (BMP and BNP) and in 3 weeks (BMP) Call us if your weight is up 3lbs in one day or you have worsening shortness of breath

## 2011-04-07 NOTE — Assessment & Plan Note (Signed)
Now s/p AVN ablation and BiV pacer.  Follow up with Dr. Lewayne Bunting as scheduled.  Continues on coumadin.

## 2011-04-07 NOTE — Assessment & Plan Note (Signed)
Encouraged him to follow up with PCP if cough is not resolved with current tx.

## 2011-04-07 NOTE — Assessment & Plan Note (Signed)
Controlled.  

## 2011-04-07 NOTE — Assessment & Plan Note (Addendum)
I think his volume looks good today.  Unfortunately he ran out of metolazone a week ago.  He never got Lisinopril.  Weight is down and breathing is improved.  I think he can go back to Metolazone TIW.  Check a bmet and bnp today.  Repeat bmet one week after restarting metolazone.  Hold off on taking Lisinopril for now.  Echo pending in a couple weeks.  If EF remains down and renal fxn ok, could consider adding ACE at that time.  Follow up with Dr. Lewayne Bunting as planned.

## 2011-04-07 NOTE — Assessment & Plan Note (Signed)
Check bmet today and follow up on bmet once he is back on metolazone for one week.

## 2011-04-07 NOTE — Progress Notes (Signed)
[address] [place], [place]  [address] Phone: [phone] Fax:  [phone]  Date:  04/07/2011   Name:  [name]       DOB:  1946/01/15 MRN:  [id number]  PCP:  Dr. Amador Cunas Primary Cardiologist:  Dr. Lewayne Bunting  Primary Electrophysiologist:  Dr. Lewayne Bunting    History of Present Illness: [name] is a 66 y.o. male who presents for post hospital follow up on CHF.  He has a h/o ischemic cardiomyopathy, previous EF 25%, persistent atrial fibrillation, chronic systolic heart failure, and COPD. He has a history of stent to the obtuse marginal. Last heart catheterization 12/06: Mid to distal LAD 30%, D2 20%, proximal circumflex 30%, OM 2 stents patent, ostial RCA 50%, mid RCA 40%, distal RCA multiple 40%. He has been in and out of atrial fibrillation for several years. He has been treated with amiodarone therapy. When his amiodarone dose is lowered he typically will go out of rhythm. He has chronic peripheral edema and has had tendencies towards volume overload. He has required fairly high dose diuretic.  He has recently been refractory to attempts at restoring NSR.  He was recently admitted for AVN ablation and insertion of a BiV pacer 12/28-12/29.  Adjustments were made in his diuretic therapy at discharge due to volume overload.  Metolazone was increased to once daily.  He was also started on lisinopril.  He is brought back today for early follow up to ensure his volume is stable.    Weight at d/c 175 lbs.  Recently tx for COPD exacerbation 03/28/11 at PCPs office with antibiotics and prednisone taper.  Cough is better.  No fevers.  He feels like breathing is better.  Probably class 2b.  Sleeps in recliner for last year.  No PND.  Edema much improved.  No chest pain.  No syncope.    Past Medical History  Diagnosis Date  . ALCOHOL ABUSE, HX OF 04/30/2008  . Atrial fibrillation 12/17/2006  . Atrial flutter 09/20/2006  . COAGULOPATHY, COUMADIN-INDUCED  04/04/2009  . Chronic systolic heart failure 09/20/2006    previous EF 25%;  Echocardiogram 12/10/10: Mild-moderate LVH, EF 50-55%, mild MR, moderate LAE, moderate RAE, mild RVE with moderately reduced RV SF, PASP 50, small pericardial effusion  . COPD 12/17/2006  . CORONARY ARTERY DISEASE 09/20/2006    s/p stent to OM;  catheterization 12/06: Mid to distal LAD 30%, D2 20%, proximal circumflex 30%, OM 2 stents patent, ostial RCA 50%, mid RCA 40%, distal RCA multiple 40%  . 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 DYSPEPSIA&OTHER [facility] DISORDERS FUNCTION STOMACH 04/04/2009  . HYPERTENSION 12/17/2006  . OBESITY 04/30/2008  . HLD (hyperlipidemia) 09/20/2006  . OTITIS EXTERNA, CHRONIC NEC 12/28/2006  . PERSONAL HX COLONIC POLYPS 05/02/2009  . PSORIASIS 04/30/2008  . CKD (chronic kidney disease) 04/30/2008    creatinine 2.2 range    Current Outpatient Prescriptions  Medication Sig Dispense Refill  . albuterol (PROAIR HFA) 108 (90 BASE) MCG/ACT inhaler Inhale 2 puffs into the lungs every 6 (six) hours as needed. For shortness of breath  1 Inhaler  6  . co-enzyme Q-10 30 MG capsule Take 30 mg by mouth daily.        Marland Kitchen ezetimibe-simvastatin (VYTORIN) 10-40 MG per tablet Take 1 tablet by mouth at bedtime.        . Fluticasone-Salmeterol (ADVAIR DISKUS) 250-50 MCG/DOSE AEPB Inhale 1 puff into the lungs every 12 (  twelve) hours.  60 each  4  . furosemide (LASIX) 80 MG tablet Take 80 mg by mouth 2 (two) times daily.        Marland Kitchen HYDROcodone-acetaminophen (VICODIN) 5-500 MG per tablet Take 1 tablet by mouth every 8 (eight) hours as needed for pain.  20 tablet  0  . insulin glargine (LANTUS) 100 UNIT/ML injection Inject 30 Units into the skin at bedtime. 30 units at bedtime       . insulin lispro (HUMALOG) 100 UNIT/ML injection 20 Units 3 (three) times daily before meals. Inject 20 units before each meal - if blood sugars more than 200 add 8 units.       Marland Kitchen lisinopril  (PRINIVIL,ZESTRIL) 2.5 MG tablet Take 1 tablet (2.5 mg total) by mouth daily.  30 tablet  5  . metolazone (ZAROXOLYN) 2.5 MG tablet Take 2.5 mg by mouth daily. Three times a week       . metoprolol (TOPROL-XL) 50 MG 24 hr tablet Take 50 mg by mouth 2 (two) times daily.        . Multiple Vitamin (MULTIVITAMIN) tablet Take 1 tablet by mouth daily.        . potassium chloride SA (K-DUR,KLOR-CON) 20 MEQ tablet Take 40 mEq by mouth daily.        . predniSONE (DELTASONE) 10 MG tablet Take 1 tablet (10 mg total) by mouth daily. 3 tabs daily for 3 days; 2 tabs daily for 3 days; 1 tab daily for 6 days  21 tablet  0  . predniSONE (STERAPRED UNI-PAK) 5 MG TABS Take by mouth.        . RABEprazole (ACIPHEX) 20 MG tablet Take 20 mg by mouth daily.        Marland Kitchen sulfamethoxazole-trimethoprim (SEPTRA DS) 800-160 MG per tablet Take 1 tablet by mouth 2 (two) times daily.  20 tablet  0  . temazepam (RESTORIL) 30 MG capsule Take 30 mg by mouth at bedtime as needed.        . tiotropium (SPIRIVA HANDIHALER) 18 MCG inhalation capsule Place 1 capsule (18 mcg total) into inhaler and inhale daily.  90 capsule  1  . traMADol (ULTRAM) 50 MG tablet Take 50 mg by mouth every 6 (six) hours as needed. Maximum dose= 8 tablets per day       . traMADol (ULTRAM) 50 MG tablet TAKE ONE TABLET BY MOUTH EVERY 6 HOURS AS NEEDED  90 tablet  0  . warfarin (COUMADIN) 2 MG tablet Take 2 mg by mouth daily. Tuesday, Thursday and Saturday      . warfarin (COUMADIN) 5 MG tablet Take 2.5 mg by mouth as directed. Takes on Monday, Wednesday, Friday and Sunday.        Allergies: Allergies  Allergen Reactions  . Aspirin     REACTION: Swelling, trouble breathing  . Tetracycline Hcl     REACTION: Swelling, trouble breathing    History  Substance Use Topics  . Smoking status: Former Smoker -- 1.0 packs/day for 50 years    Types: Cigarettes    Quit date: 03/23/2005  . Smokeless tobacco: Never Used  . Alcohol Use: No     quit drinking 2007      PHYSICAL EXAM: VS:  BP 122/76  Pulse 82  Ht 5\' 6"  (1.676 m)  Wt 169 lb (76.658 kg)  BMI 27.28 kg/m2 Well nourished, well developed, in no acute distress HEENT: normal Neck: no JVD Cardiac:  normal S1, S2; RRR; no murmur Lungs:  Scattered  inspiratory rhonchi bilaterally; I cannot appreciate rales Abd: soft, nontender, no hepatomegaly Ext: 1+ bilat ankle edema Skin: warm and dry Neuro:  CNs 2-12 intact, no focal abnormalities noted  EKG:   AFib, HR 82, V-paced  ASSESSMENT AND PLAN:

## 2011-04-08 ENCOUNTER — Encounter: Payer: Medicare Other | Admitting: Physician Assistant

## 2011-04-08 ENCOUNTER — Other Ambulatory Visit: Payer: Self-pay | Admitting: Physician Assistant

## 2011-04-08 DIAGNOSIS — R0600 Dyspnea, unspecified: Secondary | ICD-10-CM

## 2011-04-08 MED ORDER — POTASSIUM CHLORIDE CRYS ER 20 MEQ PO TBCR: 20.0000 meq | EXTENDED_RELEASE_TABLET | Freq: Every day | ORAL | Status: DC

## 2011-04-08 MED ORDER — FUROSEMIDE 80 MG PO TABS: ORAL_TABLET | ORAL | Status: DC

## 2011-04-16 ENCOUNTER — Other Ambulatory Visit: Payer: Self-pay | Admitting: Internal Medicine

## 2011-04-16 ENCOUNTER — Other Ambulatory Visit: Payer: Self-pay

## 2011-04-16 MED ORDER — EZETIMIBE-SIMVASTATIN 10-40 MG PO TABS: 1.0000 | ORAL_TABLET | Freq: Every day | ORAL | Status: DC

## 2011-04-16 NOTE — Telephone Encounter (Signed)
Rx sent to pharmacy electronically.   

## 2011-04-27 ENCOUNTER — Telehealth: Payer: Self-pay | Admitting: Internal Medicine

## 2011-04-27 ENCOUNTER — Ambulatory Visit (HOSPITAL_COMMUNITY): Payer: Medicare Other | Attending: Internal Medicine | Admitting: Radiology

## 2011-04-27 ENCOUNTER — Ambulatory Visit (INDEPENDENT_AMBULATORY_CARE_PROVIDER_SITE_OTHER): Payer: Medicare Other | Admitting: Pharmacist

## 2011-04-27 ENCOUNTER — Encounter: Payer: Self-pay | Admitting: Internal Medicine

## 2011-04-27 ENCOUNTER — Ambulatory Visit (INDEPENDENT_AMBULATORY_CARE_PROVIDER_SITE_OTHER): Payer: Medicare Other | Admitting: Internal Medicine

## 2011-04-27 ENCOUNTER — Other Ambulatory Visit (INDEPENDENT_AMBULATORY_CARE_PROVIDER_SITE_OTHER): Payer: Medicare Other | Admitting: *Deleted

## 2011-04-27 ENCOUNTER — Ambulatory Visit (INDEPENDENT_AMBULATORY_CARE_PROVIDER_SITE_OTHER)
Admission: RE | Admit: 2011-04-27 | Discharge: 2011-04-27 | Disposition: A | Discharge status: Home / Self Care | Payer: Medicare Other | Source: Ambulatory Visit | Attending: Internal Medicine | Admitting: Internal Medicine

## 2011-04-27 DIAGNOSIS — E785 Hyperlipidemia, unspecified: Secondary | ICD-10-CM | POA: Insufficient documentation

## 2011-04-27 DIAGNOSIS — I509 Heart failure, unspecified: Secondary | ICD-10-CM | POA: Insufficient documentation

## 2011-04-27 DIAGNOSIS — I4892 Unspecified atrial flutter: Secondary | ICD-10-CM

## 2011-04-27 DIAGNOSIS — J4489 Other specified chronic obstructive pulmonary disease: Secondary | ICD-10-CM | POA: Insufficient documentation

## 2011-04-27 DIAGNOSIS — I4891 Unspecified atrial fibrillation: Secondary | ICD-10-CM

## 2011-04-27 DIAGNOSIS — Z950 Presence of cardiac pacemaker: Secondary | ICD-10-CM

## 2011-04-27 DIAGNOSIS — I5042 Chronic combined systolic (congestive) and diastolic (congestive) heart failure: Secondary | ICD-10-CM

## 2011-04-27 DIAGNOSIS — J441 Chronic obstructive pulmonary disease with (acute) exacerbation: Secondary | ICD-10-CM

## 2011-04-27 DIAGNOSIS — R0602 Shortness of breath: Secondary | ICD-10-CM | POA: Insufficient documentation

## 2011-04-27 DIAGNOSIS — I5022 Chronic systolic (congestive) heart failure: Secondary | ICD-10-CM

## 2011-04-27 DIAGNOSIS — I2589 Other forms of chronic ischemic heart disease: Secondary | ICD-10-CM | POA: Insufficient documentation

## 2011-04-27 DIAGNOSIS — J449 Chronic obstructive pulmonary disease, unspecified: Secondary | ICD-10-CM | POA: Insufficient documentation

## 2011-04-27 DIAGNOSIS — I2510 Atherosclerotic heart disease of native coronary artery without angina pectoris: Secondary | ICD-10-CM | POA: Insufficient documentation

## 2011-04-27 DIAGNOSIS — N289 Disorder of kidney and ureter, unspecified: Secondary | ICD-10-CM | POA: Insufficient documentation

## 2011-04-27 DIAGNOSIS — D68318 Other hemorrhagic disorder due to intrinsic circulating anticoagulants, antibodies, or inhibitors: Secondary | ICD-10-CM

## 2011-04-27 DIAGNOSIS — I10 Essential (primary) hypertension: Secondary | ICD-10-CM | POA: Insufficient documentation

## 2011-04-27 LAB — BASIC METABOLIC PANEL
CO2: 32 mEq/L (ref 19–32)
Chloride: 99 mEq/L (ref 96–112)
Sodium: 137 mEq/L (ref 135–145)

## 2011-04-27 LAB — POCT INR: INR: 1.7

## 2011-04-27 MED ORDER — AMOXICILLIN-POT CLAVULANATE 875-125 MG PO TABS: 1.0000 | ORAL_TABLET | Freq: Two times a day (BID) | ORAL | Status: DC

## 2011-04-27 MED ORDER — GUAIFENESIN-CODEINE 100-10 MG/5ML PO SYRP: ORAL_SOLUTION | ORAL | Status: DC

## 2011-04-27 MED ORDER — PREDNISONE (PAK) 10 MG PO TABS: 10.0000 mg | ORAL_TABLET | Freq: Every day | ORAL | Status: AC

## 2011-04-27 NOTE — Telephone Encounter (Signed)
Pt's wife aware med called in

## 2011-04-27 NOTE — Assessment & Plan Note (Addendum)
If anything, his congestive heart failure is improved. He will continue his diuretic therapy. Will check BNP as it is difficult to know how much of his dyspnea is related to CHF vs COPD

## 2011-04-27 NOTE — Assessment & Plan Note (Signed)
Today he will stop his anticoagulation in the setting of a pericardial effusion. We'll plan to reecho him in several weeks.

## 2011-04-27 NOTE — Progress Notes (Signed)
HPI   [name] returns today for an unscheduled visit. He underwent 2-D echocardiography earlier this morning which demonstrated a moderate sized pericardial effusion. The patient underwent insertion of a biventricular pacemaker and AV node ablation approximately 6 weeks ago. For 2 weeks after the procedure he felt well. He then developed an upper respired poor illness. He has been treated with one round of antibiotics and steroids. He initially improved. Over the last 2 weeks however, he has gradually worsened. He notes shortness of breath, productive cough, subjective fevers, and now has developed intermittent chest pain. He noted the episode of chest pain after a particularly bad cough. The cough is productive for thick yellowish colored sputum. He has had no syncope. He has minimal peripheral edema. Allergies  Allergen Reactions  . Aspirin     REACTION: Swelling, trouble breathing  . Tetracycline Hcl     REACTION: Swelling, trouble breathing     Current Outpatient Prescriptions  Medication Sig Dispense Refill  . albuterol (PROAIR HFA) 108 (90 BASE) MCG/ACT inhaler Inhale 2 puffs into the lungs every 6 (six) hours as needed. For shortness of breath  1 Inhaler  6  . co-enzyme Q-10 30 MG capsule Take 30 mg by mouth daily.        Marland Kitchen ezetimibe-simvastatin (VYTORIN) 10-40 MG per tablet Take 1 tablet by mouth at bedtime.  90 tablet  0  . Fluticasone-Salmeterol (ADVAIR DISKUS) 250-50 MCG/DOSE AEPB Inhale 1 puff into the lungs every 12 (twelve) hours.  60 each  4  . furosemide (LASIX) 80 MG tablet Take 80 mg by mouth 2 (two) times daily.      . insulin glargine (LANTUS) 100 UNIT/ML injection Inject 30 Units into the skin at bedtime. 30 units at bedtime       . insulin lispro (HUMALOG) 100 UNIT/ML injection 20 Units 3 (three) times daily before meals. Inject 20 units before each meal - if blood sugars more than 200 add 8 units.       Marland Kitchen lisinopril (PRINIVIL,ZESTRIL) 2.5 MG tablet Take 2.5 mg by mouth  daily.      . metolazone (ZAROXOLYN) 2.5 MG tablet Take 2.5 mg by mouth daily. Take 1 tablet three times a week      . metoprolol (TOPROL-XL) 50 MG 24 hr tablet Take 50 mg by mouth 2 (two) times daily.        . potassium chloride SA (K-DUR,KLOR-CON) 20 MEQ tablet Take 40 mEq by mouth daily.      . RABEprazole (ACIPHEX) 20 MG tablet Take 20 mg by mouth daily.        . temazepam (RESTORIL) 30 MG capsule Take 30 mg by mouth at bedtime as needed.        . tiotropium (SPIRIVA HANDIHALER) 18 MCG inhalation capsule Place 1 capsule (18 mcg total) into inhaler and inhale daily.  90 capsule  1  . traMADol (ULTRAM) 50 MG tablet Take 50 mg by mouth every 6 (six) hours as needed. Maximum dose= 8 tablets per day       . traMADol (ULTRAM) 50 MG tablet TAKE ONE TABLET BY MOUTH EVERY 6 HOURS AS NEEDED  90 tablet  0  . warfarin (COUMADIN) 5 MG tablet Take 2.5 mg by mouth as directed. Takes on Monday, Wednesday, Friday and Sunday.      . predniSONE (DELTASONE) 10 MG tablet Take 1 tablet (10 mg total) by mouth daily. 3 tabs daily for 3 days; 2 tabs daily for 3 days; 1 tab  daily for 6 days  21 tablet  0     Past Medical History  Diagnosis Date  . ALCOHOL ABUSE, HX OF 04/30/2008  . 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 Atrial flutter 09/20/2006  . 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 Chronic systolic heart failure 09/20/2006    previous EF 25%;  Echocardiogram 12/10/10: Mild-moderate LVH, EF 50-55%, mild MR, moderate LAE, moderate RAE, mild RVE with moderately reduced RV SF, PASP 50, small pericardial effusion  . COPD 12/17/2006  . CORONARY ARTERY DISEASE 09/20/2006    s/p stent to OM;  catheterization 12/06: Mid to distal LAD 30%, D2 20%, proximal circumflex 30%, OM 2 stents patent, ostial RCA 50%, mid RCA 40%, distal RCA multiple 40%  . 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 DYSPEPSIA&OTHER [facility] DISORDERS FUNCTION  STOMACH 04/04/2009  . HYPERTENSION 12/17/2006  . OBESITY 04/30/2008  . HLD (hyperlipidemia) 09/20/2006  . OTITIS EXTERNA, CHRONIC NEC 12/28/2006  . PERSONAL HX COLONIC POLYPS 05/02/2009  . PSORIASIS 04/30/2008  . CKD (chronic kidney disease) 04/30/2008    creatinine 2.2 range    ROS:   All systems reviewed and negative except as noted in the HPI.   Past Surgical History  Procedure Date  . Cardiac defibrillator placement   . Angioplasty   . Hernia repair     bilat  . Cardiac catheterization   . Coronary angioplasty     Stents placed  . Transthoracic echocardiogram 2005/2006/2008/2011     Family History  Problem Relation Age of Onset  . Lung cancer Mother     and aunt  . Heart attack Father   . Diabetes Sister     and aunt  . Colon cancer Neg Hx      History   Social History  . Marital Status: Married    Spouse Name: N/A    Number of Children: 0  . Years of Education: N/A   Occupational History  . [employer]    Social History Main Topics  . Smoking status: Former Smoker -- 1.0 packs/day for 50 years    Types: Cigarettes    Quit date: 03/23/2005  . Smokeless tobacco: Never Used  . Alcohol Use: No     quit drinking 2007  . Drug Use: No  . Sexually Active: Not on file   Other Topics Concern  . Not on file   Social History Narrative  . No narrative on file     BP 114/76  Pulse 80  Wt 81.103 kg (178 lb 12.8 oz)  Physical Exam:  Ill appearing NAD HEENT: Unremarkable Neck:  No JVD, no thyromegally Lymphatics:  No adenopathy Back:  No CVA tenderness Lungs:  No obvious rales or rhonchi or wheezes. No increased work of breathing. HEART:  Regular rate rhythm, no murmurs, no rubs, no clicks. Well-healed pacemaker incision. Abd:  soft, positive bowel sounds, no organomegally, no rebound, no guarding Ext:  2 plus pulses, no edema, no cyanosis, no clubbing Skin:  No rashes no nodules Neuro:  CN II through XII intact, motor grossly intact  EKG Atrial  fibrillation with biventricular pacing at 80 beats per minute.  DEVICE  Normal device function.  See PaceArt for details.   Assess/Plan:

## 2011-04-27 NOTE — Patient Instructions (Signed)
Your physician recommends that you schedule a follow-up appointment in 2 weeks with Dr [name]   A chest x-ray takes a picture of the organs and structures inside the chest, including the heart, lungs, and blood vessels. This test can show several things, including, whether the heart is enlarges; whether fluid is building up in the lungs; and whether pacemaker / defibrillator leads are still in place.   Your physician recommends that you return for lab work today  BMP/BNP  Your physician has recommended you make the following change in your medication: 1) Start Prednisone 40mg  daily for 5 days, then decrease to 20mg  for 5 days, then decrease to 10mg  for 5 days, then decrease to 5mg  for five days 2) Start Augmentine 875/125mg ---take one by mouth twice daily for 10 days 3) Guaifensin AC 10cc every 4-6 hours as needed

## 2011-04-27 NOTE — Telephone Encounter (Signed)
New problem   Patient wife [name], reports patient meds not called into pharmacy as instructed by nurse.  Please return call to patient wife at hm# as she does not remember what medications they were

## 2011-04-27 NOTE — Assessment & Plan Note (Signed)
His current device is working normally. He has a pericardial effusion. There is no evidence of tamponade physiology. We will attempt diuresis.

## 2011-04-27 NOTE — Assessment & Plan Note (Signed)
The patient's bronchitis appears to be worsened. I discussed the treatment options. I have offered him hospitalization but he declines. We will reinitiate antibiotics and steroid therapy and cough medicine. We'll have him back for followup in a few weeks.

## 2011-04-28 ENCOUNTER — Other Ambulatory Visit: Payer: Self-pay | Admitting: Internal Medicine

## 2011-04-28 ENCOUNTER — Telehealth: Payer: Self-pay | Admitting: *Deleted

## 2011-04-28 NOTE — Telephone Encounter (Signed)
pt notified of lab results today and gave verbal understanding today. [name]  

## 2011-04-28 NOTE — Telephone Encounter (Signed)
Message copied by Tarri Fuller on Tue Apr 28, 2011  8:59 AM ------      Message from: [place], [place] T      Created: Mon Apr 27, 2011  3:57 PM       Creatinine better      Looks like he saw Dr. Lewayne Bunting today as well and will cc copy of labs to him      Tereso Newcomer, PA-C  3:57 PM 04/27/2011

## 2011-05-02 ENCOUNTER — Other Ambulatory Visit: Payer: Self-pay | Admitting: Internal Medicine

## 2011-05-14 ENCOUNTER — Other Ambulatory Visit: Payer: Self-pay | Admitting: Internal Medicine

## 2011-05-15 ENCOUNTER — Other Ambulatory Visit: Payer: Self-pay | Admitting: Internal Medicine

## 2011-05-15 NOTE — Telephone Encounter (Signed)
ok 

## 2011-05-15 NOTE — Telephone Encounter (Signed)
done

## 2011-05-15 NOTE — Telephone Encounter (Signed)
This was filled 04/28/11 # [phone]  0Rf Please advise

## 2011-05-18 ENCOUNTER — Encounter: Payer: Self-pay | Admitting: Internal Medicine

## 2011-05-18 ENCOUNTER — Ambulatory Visit (INDEPENDENT_AMBULATORY_CARE_PROVIDER_SITE_OTHER): Payer: Medicare Other | Admitting: Internal Medicine

## 2011-05-18 ENCOUNTER — Encounter: Payer: Medicare Other | Admitting: *Deleted

## 2011-05-18 ENCOUNTER — Ambulatory Visit (INDEPENDENT_AMBULATORY_CARE_PROVIDER_SITE_OTHER): Payer: Medicare Other | Admitting: Pharmacist

## 2011-05-18 VITALS — BP 122/68 | HR 84 | Wt 184.8 lb

## 2011-05-18 DIAGNOSIS — Z950 Presence of cardiac pacemaker: Secondary | ICD-10-CM

## 2011-05-18 DIAGNOSIS — I2510 Atherosclerotic heart disease of native coronary artery without angina pectoris: Secondary | ICD-10-CM

## 2011-05-18 DIAGNOSIS — I10 Essential (primary) hypertension: Secondary | ICD-10-CM

## 2011-05-18 DIAGNOSIS — I4892 Unspecified atrial flutter: Secondary | ICD-10-CM

## 2011-05-18 DIAGNOSIS — J9610 Chronic respiratory failure, unspecified whether with hypoxia or hypercapnia: Secondary | ICD-10-CM

## 2011-05-18 DIAGNOSIS — I5042 Chronic combined systolic (congestive) and diastolic (congestive) heart failure: Secondary | ICD-10-CM

## 2011-05-18 DIAGNOSIS — I509 Heart failure, unspecified: Secondary | ICD-10-CM

## 2011-05-18 DIAGNOSIS — E1142 Type 2 diabetes mellitus with diabetic polyneuropathy: Secondary | ICD-10-CM

## 2011-05-18 DIAGNOSIS — E1149 Type 2 diabetes mellitus with other diabetic neurological complication: Secondary | ICD-10-CM

## 2011-05-18 DIAGNOSIS — I4891 Unspecified atrial fibrillation: Secondary | ICD-10-CM

## 2011-05-18 DIAGNOSIS — E119 Type 2 diabetes mellitus without complications: Secondary | ICD-10-CM

## 2011-05-18 LAB — POCT INR: INR: 2.2

## 2011-05-18 MED ORDER — TRAMADOL HCL 50 MG PO TABS: 50.0000 mg | ORAL_TABLET | Freq: Four times a day (QID) | ORAL | Status: DC | PRN

## 2011-05-18 MED ORDER — TEMAZEPAM 15 MG PO CAPS: 15.0000 mg | ORAL_CAPSULE | Freq: Every evening | ORAL | Status: AC | PRN

## 2011-05-18 MED ORDER — METOLAZONE 2.5 MG PO TABS: ORAL_TABLET | ORAL | Status: AC

## 2011-05-18 NOTE — Assessment & Plan Note (Signed)
The pacemaker was not interrogated today but was working normally previously. He will be back in several weeks and we will plan to recheck at that time.

## 2011-05-18 NOTE — Patient Instructions (Signed)
Limit your sodium (Salt) intake   Please check your hemoglobin A1c every 3 months  You need to lose weight.  Consider a lower calorie diet and regular exercise. 

## 2011-05-18 NOTE — Assessment & Plan Note (Signed)
His blood pressure today is well controlled. He will continue his current medications and maintain a low-sodium diet.

## 2011-05-18 NOTE — Patient Instructions (Addendum)
Your physician recommends that you schedule a follow-up appointment in: 3 weeks with Dr [name] and schedule Cou[facility] the same day  Your physician recommends that you return for lab work in: one week BMP  Your physician has recommended you make the following change in your medication:  1) Start Zaroxlyn 2.5mg   ---take one today and one tomorrow prior to Furosemide dose, then start taking on Mon/Wed/ Fri one tablet prior to Furosemide

## 2011-05-18 NOTE — Progress Notes (Signed)
HPI [name] he returns today for followup. I saw the patient several weeks ago when he was having increasing dyspnea as well as upper respiratory tract symptoms. 2-D echo at that time demonstrated a moderate pericardial effusion. He has been treated with diuresis, antibiotics, and decongestants. His weight is up 2 kg. His productive cough has improved. He does note peripheral edema which is worse than usual, and dyspnea, PND, and orthopnea. Allergies  Allergen Reactions  . Aspirin     REACTION: Swelling, trouble breathing  . Tetracycline Hcl     REACTION: Swelling, trouble breathing     Current Outpatient Prescriptions  Medication Sig Dispense Refill  . Fluticasone-Salmeterol (ADVAIR DISKUS) 250-50 MCG/DOSE AEPB Inhale 1 puff into the lungs every 12 (twelve) hours.  60 each  4  . furosemide (LASIX) 80 MG tablet Take 80 mg by mouth 2 (two) times daily.      Marland Kitchen guaiFENesin-codeine (ROBITUSSIN AC) 100-10 MG/5ML syrup Take 10cc every 4-6 hours as needed  240 mL  0  . insulin glargine (LANTUS) 100 UNIT/ML injection Inject 30 Units into the skin at bedtime. 30 units at bedtime       . insulin lispro (HUMALOG) 100 UNIT/ML injection 20 Units 3 (three) times daily before meals. Inject 20 units before each meal - if blood sugars more than 200 add 8 units.       . metoprolol (TOPROL-XL) 50 MG 24 hr tablet Take 50 mg by mouth 2 (two) times daily.        . RABEprazole (ACIPHEX) 20 MG tablet Take 20 mg by mouth daily.        Marland Kitchen tiotropium (SPIRIVA HANDIHALER) 18 MCG inhalation capsule Place 1 capsule (18 mcg total) into inhaler and inhale daily.  90 capsule  1  . DISCONTD: metolazone (ZAROXOLYN) 2.5 MG tablet Take 2.5 mg by mouth daily. Take 1 tablet three times a week      . albuterol (PROAIR HFA) 108 (90 BASE) MCG/ACT inhaler Inhale 2 puffs into the lungs every 6 (six) hours as needed. For shortness of breath  1 Inhaler  6  . amoxicillin-clavulanate (AUGMENTIN) 875-125 MG per tablet Take 1 tablet by mouth  2 (two) times daily.  14 tablet  0  . amoxicillin-clavulanate (AUGMENTIN) 875-125 MG per tablet Take 1 tablet by mouth 2 (two) times daily.  24 tablet  0  . co-enzyme Q-10 30 MG capsule Take 30 mg by mouth daily.        Marland Kitchen ezetimibe-simvastatin (VYTORIN) 10-40 MG per tablet Take 1 tablet by mouth at bedtime.  90 tablet  0  . metolazone (ZAROXOLYN) 2.5 MG tablet Take 2.5 mg by mouth. Take one tablet by mouth on Mon/ Wed and Fri in the morning       . metolazone (ZAROXOLYN) 2.5 MG tablet Take one tablet by mouth on Mon/Wed/Fri 30 min prior to Furosemide  30 tablet  3  . temazepam (RESTORIL) 15 MG capsule Take 1 capsule (15 mg total) by mouth at bedtime as needed for sleep.  60 capsule  2  . traMADol (ULTRAM) 50 MG tablet Take 1 tablet (50 mg total) by mouth every 6 (six) hours as needed. Maximum dose= 8 tablets per day  180 tablet  4  . DISCONTD: metolazone (ZAROXOLYN) 2.5 MG tablet Take 1 tablet (2.5 mg total) by mouth daily.  30 tablet  5     Past Medical History  Diagnosis Date  . ALCOHOL ABUSE, HX OF 04/30/2008  . Atrial fibrillation  12/17/2006    s/p AVN ablation and insertion of BiV pacer 12/12  . Atrial flutter 09/20/2006  . 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 Chronic systolic heart failure 09/20/2006    previous EF 25%;  Echocardiogram 12/10/10: Mild-moderate LVH, EF 50-55%, mild MR, moderate LAE, moderate RAE, mild RVE with moderately reduced RV SF, PASP 50, small pericardial effusion  . COPD 12/17/2006  . CORONARY ARTERY DISEASE 09/20/2006    s/p stent to OM;  catheterization 12/06: Mid to distal LAD 30%, D2 20%, proximal circumflex 30%, OM 2 stents patent, ostial RCA 50%, mid RCA 40%, distal RCA multiple 40%  . 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 DYSPEPSIA&OTHER [facility] DISORDERS FUNCTION STOMACH 04/04/2009  . HYPERTENSION 12/17/2006  . OBESITY 04/30/2008  . HLD (hyperlipidemia) 09/20/2006  . OTITIS EXTERNA, CHRONIC NEC 12/28/2006  .  PERSONAL HX COLONIC POLYPS 05/02/2009  . PSORIASIS 04/30/2008  . CKD (chronic kidney disease) 04/30/2008    creatinine 2.2 range    ROS:   All systems reviewed and negative except as noted in the HPI.   Past Surgical History  Procedure Date  . Cardiac defibrillator placement   . Angioplasty   . Hernia repair     bilat  . Cardiac catheterization   . Coronary angioplasty     Stents placed  . Transthoracic echocardiogram 2005/2006/2008/2011     Family History  Problem Relation Age of Onset  . Lung cancer Mother     and aunt  . Heart attack Father   . Diabetes Sister     and aunt  . Colon cancer Neg Hx      History   Social History  . Marital Status: Married    Spouse Name: N/A    Number of Children: 0  . Years of Education: N/A   Occupational History  . [employer]    Social History Main Topics  . Smoking status: Former Smoker -- 1.0 packs/day for 50 years    Types: Cigarettes    Quit date: 03/23/2005  . Smokeless tobacco: Never Used  . Alcohol Use: No     quit drinking 2007  . Drug Use: No  . Sexually Active: Not on file   Other Topics Concern  . Not on file   Social History Narrative  . No narrative on file     BP 122/68  Pulse 84  Wt 83.825 kg (184 lb 12.8 oz)  Physical Exam:  ill appearing diskempt appearing, NAD HEENT: Unremarkable Neck:  No JVD, no thyromegally Lungs:  Clear with no wheezes, rales, or rhonchi HEART:  Regular rate rhythm, no murmurs, no rubs, no clicks Abd:  soft, positive bowel sounds, no organomegally, no rebound, no guarding Ext:  2 plus pulses, no edema, no cyanosis, no clubbing Skin:  No rashes no nodules Neuro:  CN II through XII intact, motor grossly intact  DEVICE  Normal device function.  See PaceArt for details.   Assess/Plan:

## 2011-05-18 NOTE — Assessment & Plan Note (Signed)
At this point the patient's volume overload appears to be worse. I've recommended that he start taking Zaroxolyn 3 times a week. In addition, he is instructed to reduce his sodium intake.

## 2011-05-18 NOTE — Progress Notes (Signed)
  Subjective:    Patient ID: [name], male    DOB: [birth year], 66 y.o.   MRN: [id number]  HPI  66 year old patient who is seen today for followup. He is followed closely by cardiology and pulmonary medicine. He remains on chronic home oxygen therapy. Complaints include distant exertion chronic cough and peripheral edema. He has insulin requiring diabetes and states that over the past couple months has enjoyed much improved diabetic control. He has been much more compliant with his medications and eating habits. He states with rare exceptions blood sugars are always less than 200    Review of Systems  Constitutional: Positive for fatigue.  Respiratory: Positive for cough, shortness of breath and wheezing.   Neurological: Positive for weakness.       Objective:   Physical Exam  Constitutional: He is oriented to person, place, and time. He appears well-developed.       Nasal oxygen in place Blood pressure 120/82 Afebrile Weight 186 O2 saturation 99% Pulse rate 55  HENT:  Head: Normocephalic.  Right Ear: External ear normal.  Left Ear: External ear normal.  Eyes: Conjunctivae and EOM are normal.  Neck: Normal range of motion. Neck supple.  Cardiovascular: Normal rate and normal heart sounds.   Pulmonary/Chest: No respiratory distress. He has rales.        A few  bibasilar rales  Abdominal: Bowel sounds are normal.  Musculoskeletal: Normal range of motion. He exhibits edema. He exhibits no tenderness.  Neurological: He is alert and oriented to person, place, and time.  Psychiatric: He has a normal mood and affect. His behavior is normal.          Assessment & Plan:   Diabetes mellitus. Historically seems to be improved. We'll check a hemoglobin A1c. Lifestyle issues discussed Chronic heart failure Advanced COPD oxygen dependent  Followup cardiology and pulmonary as scheduled Low-salt  diet recommended

## 2011-05-25 ENCOUNTER — Other Ambulatory Visit: Payer: Medicare Other

## 2011-05-26 ENCOUNTER — Other Ambulatory Visit (INDEPENDENT_AMBULATORY_CARE_PROVIDER_SITE_OTHER): Payer: Medicare Other

## 2011-05-26 ENCOUNTER — Encounter: Payer: Medicare Other | Admitting: Internal Medicine

## 2011-05-26 ENCOUNTER — Telehealth: Payer: Self-pay | Admitting: Physician Assistant

## 2011-05-26 DIAGNOSIS — I509 Heart failure, unspecified: Secondary | ICD-10-CM

## 2011-05-26 LAB — BASIC METABOLIC PANEL
BUN: 29 mg/dL — ABNORMAL HIGH (ref 6–23)
Calcium: 8.8 mg/dL (ref 8.4–10.5)
Creatinine, Ser: 1.8 mg/dL — ABNORMAL HIGH (ref 0.4–1.5)
GFR: 40.68 mL/min — ABNORMAL LOW (ref >60.00)
Glucose, Bld: 138 mg/dL — ABNORMAL HIGH (ref 70–99)

## 2011-05-26 NOTE — Telephone Encounter (Signed)
Called patient multiple times regarding critical lab value of K 2.8 on BMET drawn today. Was finally able to reach on fourth attempt. Patient recently started on metolazone. He states he takes KCl 20 mEq BID. I advised to double that at KCl 40 mEq BID, and that he will likely need another BMET to reassess his potassium level in the near future. Patient is otherwise asymptomatic and feels "great." Will route this note to Dr. [name] to decide on further recommendations and follow-up BMET.   Jacqulyn Bath, PA-C 05/26/2011 10:36 PM

## 2011-06-04 ENCOUNTER — Other Ambulatory Visit: Payer: Self-pay | Admitting: Internal Medicine

## 2011-06-04 NOTE — Telephone Encounter (Signed)
agree

## 2011-06-05 NOTE — Telephone Encounter (Signed)
Called in.

## 2011-06-05 NOTE — Telephone Encounter (Signed)
Ok will need to be called in

## 2011-06-05 NOTE — Telephone Encounter (Signed)
Please advise 

## 2011-06-09 ENCOUNTER — Encounter: Payer: Self-pay | Admitting: Internal Medicine

## 2011-06-09 ENCOUNTER — Ambulatory Visit (INDEPENDENT_AMBULATORY_CARE_PROVIDER_SITE_OTHER): Payer: Medicare Other | Admitting: Internal Medicine

## 2011-06-09 ENCOUNTER — Ambulatory Visit (INDEPENDENT_AMBULATORY_CARE_PROVIDER_SITE_OTHER): Payer: Medicare Other | Admitting: *Deleted

## 2011-06-09 VITALS — BP 100/54 | Ht 66.0 in | Wt 175.8 lb

## 2011-06-09 DIAGNOSIS — Z950 Presence of cardiac pacemaker: Secondary | ICD-10-CM

## 2011-06-09 DIAGNOSIS — I4892 Unspecified atrial flutter: Secondary | ICD-10-CM

## 2011-06-09 DIAGNOSIS — J209 Acute bronchitis, unspecified: Secondary | ICD-10-CM

## 2011-06-09 DIAGNOSIS — I4891 Unspecified atrial fibrillation: Secondary | ICD-10-CM

## 2011-06-09 DIAGNOSIS — I5042 Chronic combined systolic (congestive) and diastolic (congestive) heart failure: Secondary | ICD-10-CM

## 2011-06-09 LAB — PACEMAKER DEVICE OBSERVATION
BATTERY VOLTAGE: 2.9178 V
RV LEAD AMPLITUDE: 10.1 mv
RV LEAD IMPEDENCE PM: 475 Ohm
RV LEAD THRESHOLD: 0.5 V
VENTRICULAR PACING PM: 99

## 2011-06-09 NOTE — Assessment & Plan Note (Signed)
This is his main problem. He has a chronic productive cough with thick green brown sputum. He will continue his current medical therapy. I have asked him to followup with our pulmonary consultants.

## 2011-06-09 NOTE — Assessment & Plan Note (Signed)
His heart failure symptoms are currently class II. He is instructed to maintain a low-sodium diet and to continue his current medical therapy.

## 2011-06-09 NOTE — Patient Instructions (Signed)
Your physician wants you to follow-up in: Dec 2013 with Dr Court Joy will receive a reminder letter in the mail two months in advance. If you don't receive a letter, please call our office to schedule the follow-up appointment.

## 2011-06-09 NOTE — Assessment & Plan Note (Signed)
His ventricular rate is well controlled after AV node ablation.

## 2011-06-09 NOTE — Progress Notes (Signed)
HPI [name] returns today for followup. He is a 66 year old man with a nonischemic, tachycardia mediated, cardiomyopathy. He has severe COPD and chronic bronchitis. He is status post AV node ablation with insertion of a biventricular pacemaker. Since his procedure, he is gradually improved. Initially, he was volume overloaded. Increasing doses of diuretics have improved this markedly. Most recently, he has been bothered by chronic bronchitis. He has been treated with multiple courses of antibiotics. Despite this he continues to have a productive cough. He no longer smokes cigarettes. He denies fevers and chills. Allergies  Allergen Reactions  . Aspirin     REACTION: Swelling, trouble breathing  . Tetracycline Hcl     REACTION: Swelling, trouble breathing     Current Outpatient Prescriptions  Medication Sig Dispense Refill  . albuterol (PROAIR HFA) 108 (90 BASE) MCG/ACT inhaler Inhale 2 puffs into the lungs every 6 (six) hours as needed. For shortness of breath  1 Inhaler  6  . CHERATUSSIN AC 100-10 MG/5ML syrup TAKE 2 TEASPOONSFUL BY MOUTH EVERY 4-6 HOURS AS NEEDED  240 mL  0  . co-enzyme Q-10 30 MG capsule Take 30 mg by mouth daily.        Marland Kitchen ezetimibe-simvastatin (VYTORIN) 10-40 MG per tablet Take 1 tablet by mouth at bedtime.  90 tablet  0  . Fluticasone-Salmeterol (ADVAIR DISKUS) 250-50 MCG/DOSE AEPB Inhale 1 puff into the lungs every 12 (twelve) hours.  60 each  4  . furosemide (LASIX) 80 MG tablet Take 80 mg by mouth 2 (two) times daily.      . insulin glargine (LANTUS) 100 UNIT/ML injection Inject 30 Units into the skin at bedtime. 30 units at bedtime       . insulin lispro (HUMALOG) 100 UNIT/ML injection 20 Units 3 (three) times daily before meals. Inject 20 units before each meal - if blood sugars more than 200 add 8 units.       . metolazone (ZAROXOLYN) 2.5 MG tablet Take one tablet by mouth on Mon/Wed/Fri 30 min prior to Furosemide  30 tablet  3  . metoprolol (TOPROL-XL) 50 MG 24 hr  tablet Take 50 mg by mouth 2 (two) times daily.        . RABEprazole (ACIPHEX) 20 MG tablet Take 20 mg by mouth daily.        . temazepam (RESTORIL) 15 MG capsule Take 1 capsule (15 mg total) by mouth at bedtime as needed for sleep.  60 capsule  2  . tiotropium (SPIRIVA HANDIHALER) 18 MCG inhalation capsule Place 1 capsule (18 mcg total) into inhaler and inhale daily.  90 capsule  1  . traMADol (ULTRAM) 50 MG tablet Take 1 tablet (50 mg total) by mouth every 6 (six) hours as needed. Maximum dose= 8 tablets per day  180 tablet  4  . amoxicillin-clavulanate (AUGMENTIN) 875-125 MG per tablet Take 1 tablet by mouth 2 (two) times daily.  14 tablet  0  . amoxicillin-clavulanate (AUGMENTIN) 875-125 MG per tablet Take 1 tablet by mouth 2 (two) times daily.  24 tablet  0  . metolazone (ZAROXOLYN) 2.5 MG tablet Take 2.5 mg by mouth. Take one tablet by mouth on Mon/ Wed and Fri in the morning       . DISCONTD: metolazone (ZAROXOLYN) 2.5 MG tablet Take 1 tablet (2.5 mg total) by mouth daily.  30 tablet  5     Past Medical History  Diagnosis Date  . ALCOHOL ABUSE, HX OF 04/30/2008  . Atrial fibrillation 12/17/2006  s/p AVN ablation and insertion of BiV pacer 12/12  . Atrial flutter 09/20/2006  . 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 Chronic systolic heart failure 09/20/2006    previous EF 25%;  Echocardiogram 12/10/10: Mild-moderate LVH, EF 50-55%, mild MR, moderate LAE, moderate RAE, mild RVE with moderately reduced RV SF, PASP 50, small pericardial effusion  . COPD 12/17/2006  . CORONARY ARTERY DISEASE 09/20/2006    s/p stent to OM;  catheterization 12/06: Mid to distal LAD 30%, D2 20%, proximal circumflex 30%, OM 2 stents patent, ostial RCA 50%, mid RCA 40%, distal RCA multiple 40%  . 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 DYSPEPSIA&OTHER [facility] DISORDERS FUNCTION STOMACH 04/04/2009  . HYPERTENSION 12/17/2006  . OBESITY 04/30/2008  . HLD (hyperlipidemia)  09/20/2006  . OTITIS EXTERNA, CHRONIC NEC 12/28/2006  . PERSONAL HX COLONIC POLYPS 05/02/2009  . PSORIASIS 04/30/2008  . CKD (chronic kidney disease) 04/30/2008    creatinine 2.2 range    ROS:   All systems reviewed and negative except as noted in the HPI.   Past Surgical History  Procedure Date  . Cardiac defibrillator placement   . Angioplasty   . Hernia repair     bilat  . Cardiac catheterization   . Coronary angioplasty     Stents placed  . Transthoracic echocardiogram 2005/2006/2008/2011     Family History  Problem Relation Age of Onset  . Lung cancer Mother     and aunt  . Heart attack Father   . Diabetes Sister     and aunt  . Colon cancer Neg Hx      History   Social History  . Marital Status: Married    Spouse Name: N/A    Number of Children: 0  . Years of Education: N/A   Occupational History  . [name]    Social History Main Topics  . Smoking status: Former Smoker -- 1.0 packs/day for 50 years    Types: Cigarettes    Quit date: 03/23/2005  . Smokeless tobacco: Never Used  . Alcohol Use: No     quit drinking 2007  . Drug Use: No  . Sexually Active: Not on file   Other Topics Concern  . Not on file   Social History Narrative  . No narrative on file     BP 100/54  Ht 5\' 6"  (1.676 m)  Wt 79.742 kg (175 lb 12.8 oz)  BMI 28.37 kg/m2  Physical Exam:  Chronically ill appearing diskempt man, NAD HEENT: Unremarkable Neck:  No JVD, no thyromegally Lungs:  Scattered rhonchi and wheezes. No increased work of breathing. HEART:  Regular rate rhythm, 2/6 systolic murmur, no rubs, no clicks Abd:  soft, positive bowel sounds, no organomegally, no rebound, no guarding Ext:  2 plus pulses, no edema, no cyanosis, no clubbing Skin:  No rashes no nodules Neuro:  CN II through XII intact, motor grossly intact  DEVICE  Normal device function.  See PaceArt for details.   Assess/Plan:

## 2011-06-15 ENCOUNTER — Ambulatory Visit: Payer: Medicare Other | Admitting: Adult Health

## 2011-06-18 ENCOUNTER — Encounter: Payer: Self-pay | Admitting: Adult Health

## 2011-06-18 ENCOUNTER — Ambulatory Visit (INDEPENDENT_AMBULATORY_CARE_PROVIDER_SITE_OTHER): Payer: Medicare Other | Admitting: Adult Health

## 2011-06-18 VITALS — BP 122/82 | HR 75 | Temp 96.7°F | Ht 66.0 in | Wt 174.8 lb

## 2011-06-18 DIAGNOSIS — J449 Chronic obstructive pulmonary disease, unspecified: Secondary | ICD-10-CM

## 2011-06-18 MED ORDER — BUDESONIDE-FORMOTEROL FUMARATE 160-4.5 MCG/ACT IN AERO: 2.0000 | INHALATION_SPRAY | Freq: Two times a day (BID) | RESPIRATORY_TRACT | Status: DC

## 2011-06-18 NOTE — Assessment & Plan Note (Signed)
Recurrent exacerbation ? Upper airway irritability due to Advair  Plan Stop Advair  Begin Symbicort 160/4.8mcg 2 puffs Twice daily  -brush/rinse and gargle after use.  Mucinex  DM Twice daily  As needed  Cough/congestion.  Avelox 400mg  daily for 5 days - take with food. (samples given )  Please contact office for sooner follow up if symptoms do not improve or worsen or seek emergency care  Follow up Dr. Shelle Iron in 2-3 weeks and As needed

## 2011-06-18 NOTE — Patient Instructions (Addendum)
Stop Advair  Begin Symbicort 160/4.67mcg 2 puffs Twice daily  -brush/rinse and gargle after use.  Mucinex  DM Twice daily  As needed  Cough/congestion.  Avelox 400mg  daily for 5 days - take with food. (samples given )  Please contact office for sooner follow up if symptoms do not improve or worsen or seek emergency care  Follow up Dr. Shelle Iron in 2-3 weeks and As needed

## 2011-06-18 NOTE — Progress Notes (Signed)
  Subjective:    Patient ID: [name], male    DOB: 09-24-1945, 66 y.o.   MRN: [id number]  HPI 66 yo male with known hx of COPD   06/18/2011 Acute OV  Complains of thick prod. cough and chest congestion. Mucous pt is coughing up is thick; pt states he has to pull it out of his throat with is fingers; dark in color; foul smell. x 3weeks with increasing sob. Has been on several courses of abx and steroids , gets better shortly but comes right back.  No fever or hemoptysis  Last cxr 04/2011 with no significant changes >There are small pleural effusions with minimal  basilar atelectasis and infiltrate densities.    Review of Systems Constitutional:   No  weight loss, night sweats,  Fevers, chills, fatigue, or  lassitude.  HEENT:   No headaches,  Difficulty swallowing,  Tooth/dental problems, or  Sore throat,                No sneezing, itching, ear ache, nasal congestion, post nasal drip,   CV:  No chest pain,  Orthopnea, PND, swelling in lower extremities, anasarca, dizziness, palpitations, syncope.   GI  No heartburn, indigestion, abdominal pain, nausea, vomiting, diarrhea, change in bowel habits, loss of appetite, bloody stools.   Resp: No shortness of breath with exertion or at rest.  No excess mucus, no productive cough,  No non-productive cough,  No coughing up of blood.  No change in color of mucus.  No wheezing.  No chest wall deformity  Skin: no rash or lesions.  GU: no dysuria, change in color of urine, no urgency or frequency.  No flank pain, no hematuria   MS:  No joint pain or swelling.  No decreased range of motion.  No back pain.  Psych:  No change in mood or affect. No depression or anxiety.  No memory loss.         Objective:   Physical Exam GEN: A/Ox3; pleasant , NAD, elderly -   HEENT:  [place]/AT,  EACs-clear, TMs-wnl, NOSE-clear, THROAT-clear, no lesions, no postnasal drip or exudate noted.   NECK:  Supple w/ fair ROM; no JVD; normal carotid impulses w/o  bruits; no thyromegaly or nodules palpated; no lymphadenopathy.  RESP  Coarse BS w/ scattered rhonchi .no accessory muscle use, no dullness to percussion  CARD:  RRR, no m/r/g  , no peripheral edema, pulses intact, no cyanosis or clubbing.  GI:   Soft & nt; nml bowel sounds; no organomegaly or masses detected.  Musco: Warm bil, no deformities or joint swelling noted.   Neuro: alert, no focal deficits noted.    Skin: Warm, no lesions or rashes '       Assessment & Plan:

## 2011-06-22 NOTE — Progress Notes (Signed)
Ov reviewed and agree with plan as outlined.  

## 2011-06-23 ENCOUNTER — Ambulatory Visit (INDEPENDENT_AMBULATORY_CARE_PROVIDER_SITE_OTHER): Payer: Medicare Other | Admitting: Pharmacist

## 2011-06-23 DIAGNOSIS — I4891 Unspecified atrial fibrillation: Secondary | ICD-10-CM

## 2011-06-23 DIAGNOSIS — I4892 Unspecified atrial flutter: Secondary | ICD-10-CM

## 2011-06-25 ENCOUNTER — Other Ambulatory Visit: Payer: Self-pay | Admitting: Internal Medicine

## 2011-07-03 ENCOUNTER — Other Ambulatory Visit: Payer: Self-pay | Admitting: Internal Medicine

## 2011-07-07 ENCOUNTER — Encounter: Payer: Self-pay | Admitting: Pulmonary Disease

## 2011-07-07 ENCOUNTER — Ambulatory Visit (INDEPENDENT_AMBULATORY_CARE_PROVIDER_SITE_OTHER): Payer: Medicare Other | Admitting: Pulmonary Disease

## 2011-07-07 VITALS — BP 114/72 | HR 80 | Temp 98.2°F | Ht 66.0 in | Wt 173.8 lb

## 2011-07-07 DIAGNOSIS — Z23 Encounter for immunization: Secondary | ICD-10-CM

## 2011-07-07 DIAGNOSIS — J449 Chronic obstructive pulmonary disease, unspecified: Secondary | ICD-10-CM

## 2011-07-07 NOTE — Progress Notes (Signed)
  Subjective:    Patient ID: [name], male    DOB: 1946-01-02, 66 y.o.   MRN: [id number]  HPI The patient comes in today for followup of his known COPD.  He has had issues since the last visit with recurrent pulmonary infections, and clears with antibiotics but returns after he has finished.  He was recently seen by our nurse practitioner and treated for an acute bronchitis.  She also changed him to symbicort from Advair because of upper airway irritation.  The patient states that he has improved, but continues to have a fair amount of mucus that is now nonpurulent.  His breathing has improved as well.   Review of Systems  Constitutional: Negative for fever and unexpected weight change.  HENT: Positive for ear pain and congestion. Negative for nosebleeds, sore throat, rhinorrhea, sneezing, trouble swallowing, dental problem, postnasal drip and sinus pressure.   Eyes: Negative for redness and itching.  Respiratory: Positive for cough, shortness of breath and wheezing. Negative for chest tightness.   Cardiovascular: Positive for leg swelling. Negative for palpitations.  Gastrointestinal: Negative for nausea and vomiting.  Genitourinary: Positive for dysuria.  Musculoskeletal: Negative for joint swelling.  Skin: Negative for rash.  Neurological: Negative for headaches.  Hematological: Bruises/bleeds easily.  Psychiatric/Behavioral: Positive for agitation. Negative for dysphoric mood. The patient is not nervous/anxious.        Objective:   Physical Exam Well-developed male in no acute distress Nose without purulence or discharge noted Oropharynx with very poor dentition Chest with decreased breath sounds, no wheezes.  Prominent upper airway pseudo-wheezing. Cardiac exam with regular rate and rhythm Lower extremities without edema, no cyanosis Alert and oriented, moves all 4 extremities.       Assessment & Plan:

## 2011-07-07 NOTE — Patient Instructions (Addendum)
Stay on current breathing medications, and rinse mouth well after using symbicort. Stay on mucinex if it helps you.  If you have another chest infection in a short period of time, will image your sinuses. Will give you a pneumonia shot today Sleep with oxygen every night, use with heavier exertional activities during the day.  Cancel upcoming appt with me, and schedule followup for 3 months.

## 2011-07-07 NOTE — Assessment & Plan Note (Signed)
The patient has had recurrent pulmonary infections despite not smoking and staying compliant on his bronchodilator regimen.  It is unclear whether this is from chronic sinus disease, aspiration/reflux disease, or from his very poor dentition with aspiration of oral secretions at night.  I have asked him to continue on his excellent bronchodilator regimen, and if he has another pulmonary infection in a short period of time will check a CT of his sinuses.  I have also encouraged him to work on weight duction and some type of conditioning program.

## 2011-07-14 ENCOUNTER — Other Ambulatory Visit: Payer: Self-pay

## 2011-07-14 MED ORDER — EZETIMIBE-SIMVASTATIN 10-40 MG PO TABS: 1.0000 | ORAL_TABLET | Freq: Every day | ORAL | Status: DC

## 2011-07-19 ENCOUNTER — Encounter (HOSPITAL_COMMUNITY): Payer: Self-pay | Admitting: *Deleted

## 2011-07-19 ENCOUNTER — Emergency Department (HOSPITAL_COMMUNITY)
Admission: EM | Admit: 2011-07-19 | Discharge: 2011-07-19 | Disposition: A | Discharge status: Home / Self Care | Payer: Medicare Other | Attending: Emergency Medicine | Admitting: Emergency Medicine

## 2011-07-19 DIAGNOSIS — I129 Hypertensive chronic kidney disease with stage 1 through stage 4 chronic kidney disease, or unspecified chronic kidney disease: Secondary | ICD-10-CM | POA: Insufficient documentation

## 2011-07-19 DIAGNOSIS — I2510 Atherosclerotic heart disease of native coronary artery without angina pectoris: Secondary | ICD-10-CM | POA: Insufficient documentation

## 2011-07-19 DIAGNOSIS — E785 Hyperlipidemia, unspecified: Secondary | ICD-10-CM | POA: Insufficient documentation

## 2011-07-19 DIAGNOSIS — Z87891 Personal history of nicotine dependence: Secondary | ICD-10-CM | POA: Insufficient documentation

## 2011-07-19 DIAGNOSIS — J449 Chronic obstructive pulmonary disease, unspecified: Secondary | ICD-10-CM | POA: Insufficient documentation

## 2011-07-19 DIAGNOSIS — E1142 Type 2 diabetes mellitus with diabetic polyneuropathy: Secondary | ICD-10-CM | POA: Insufficient documentation

## 2011-07-19 DIAGNOSIS — I4891 Unspecified atrial fibrillation: Secondary | ICD-10-CM | POA: Insufficient documentation

## 2011-07-19 DIAGNOSIS — I509 Heart failure, unspecified: Secondary | ICD-10-CM | POA: Insufficient documentation

## 2011-07-19 DIAGNOSIS — N189 Chronic kidney disease, unspecified: Secondary | ICD-10-CM | POA: Insufficient documentation

## 2011-07-19 DIAGNOSIS — I5022 Chronic systolic (congestive) heart failure: Secondary | ICD-10-CM | POA: Insufficient documentation

## 2011-07-19 DIAGNOSIS — R040 Epistaxis: Secondary | ICD-10-CM | POA: Insufficient documentation

## 2011-07-19 DIAGNOSIS — E1149 Type 2 diabetes mellitus with other diabetic neurological complication: Secondary | ICD-10-CM | POA: Insufficient documentation

## 2011-07-19 DIAGNOSIS — J4489 Other specified chronic obstructive pulmonary disease: Secondary | ICD-10-CM | POA: Insufficient documentation

## 2011-07-19 DIAGNOSIS — Z794 Long term (current) use of insulin: Secondary | ICD-10-CM | POA: Insufficient documentation

## 2011-07-19 LAB — CBC
HCT: 40.5 % (ref 39.0–52.0)
Hemoglobin: 12.6 g/dL — ABNORMAL LOW (ref 13.0–17.0)
MCH: 25 pg — ABNORMAL LOW (ref 26.0–34.0)
MCHC: 31.1 g/dL (ref 30.0–36.0)
MCV: 80.2 fL (ref 78.0–100.0)
Platelets: 209 10*3/uL (ref 150–400)
RBC: 5.05 MIL/uL (ref 4.22–5.81)
RDW: 16.6 % — ABNORMAL HIGH (ref 11.5–15.5)
WBC: 10.6 10*3/uL — ABNORMAL HIGH (ref 4.0–10.5)

## 2011-07-19 LAB — PROTIME-INR
INR: 2.43 — ABNORMAL HIGH (ref 0.00–1.49)
Prothrombin Time: 26.8 seconds — ABNORMAL HIGH (ref 11.6–15.2)

## 2011-07-19 MED ORDER — HYDROCORTISONE 1 % EX CREA: TOPICAL_CREAM | CUTANEOUS | Status: AC

## 2011-07-19 MED ORDER — HYDROXYZINE HCL 25 MG PO TABS: 25.0000 mg | ORAL_TABLET | Freq: Three times a day (TID) | ORAL | Status: AC | PRN

## 2011-07-19 NOTE — ED Notes (Signed)
Pt from home with reports of a nosebleed that started yesterday, pt reports hx of nosebleeds but not to this severity. Pt also endorses wearing oxygen and taking Coumadin.

## 2011-07-19 NOTE — Discharge Instructions (Signed)

## 2011-07-19 NOTE — ED Provider Notes (Signed)
History    C66-year-old male with nosebleed. Started yesterday. Has been intermittent since then. Patient denies trauma. Patient is on Coumadin for a history of atrial fibrillation. Denies bleeding from anywhere else. No dizziness or lightheadedness. Patient with baseline shortness of breath, but no acute change. Patient is on home O2 chronically.  CSN: [id number]  Arrival date & time 07/19/11  1105   First MD Initiated Contact with Patient 07/19/11 1123      Chief Complaint  Patient presents with  . Epistaxis    (Consider location/radiation/quality/duration/timing/severity/associated sxs/prior treatment) HPI  Past Medical History  Diagnosis Date  . ALCOHOL ABUSE, HX OF 04/30/2008  . 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 Atrial flutter 09/20/2006  . 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 Chronic systolic heart failure 09/20/2006    previous EF 25%;  Echocardiogram 12/10/10: Mild-moderate LVH, EF 50-55%, mild MR, moderate LAE, moderate RAE, mild RVE with moderately reduced RV SF, PASP 50, small pericardial effusion  . COPD 12/17/2006  . CORONARY ARTERY DISEASE 09/20/2006    s/p stent to OM;  catheterization 12/06: Mid to distal LAD 30%, D2 20%, proximal circumflex 30%, OM 2 stents patent, ostial RCA 50%, mid RCA 40%, distal RCA multiple 40%  . 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 DYSPEPSIA&OTHER [facility] DISORDERS FUNCTION STOMACH 04/04/2009  . HYPERTENSION 12/17/2006  . OBESITY 04/30/2008  . HLD (hyperlipidemia) 09/20/2006  . OTITIS EXTERNA, CHRONIC NEC 12/28/2006  . PERSONAL HX COLONIC POLYPS 05/02/2009  . PSORIASIS 04/30/2008  . CKD (chronic kidney disease) 04/30/2008    creatinine 2.2 range    Past Surgical History  Procedure Date  . Cardiac defibrillator placement   . Angioplasty   . Hernia repair     bilat  . Cardiac catheterization   . Coronary angioplasty     Stents placed  .  Transthoracic echocardiogram 2005/2006/2008/2011    Family History  Problem Relation Age of Onset  . Lung cancer Mother     and aunt  . Heart attack Father   . Diabetes Sister     and aunt  . Colon cancer Neg Hx     History  Substance Use Topics  . Smoking status: Former Smoker -- 1.0 packs/day for 50 years    Types: Cigarettes    Quit date: 03/23/2005  . Smokeless tobacco: Never Used  . Alcohol Use: No     quit drinking 2007      Review of Systems   Review of symptoms negative unless otherwise noted in HPI.   Allergies  Aspirin and Tetracycline hcl  Home Medications   Current Outpatient Rx  Name Route Sig Dispense Refill  . ALBUTEROL SULFATE HFA 108 (90 BASE) MCG/ACT IN AERS Inhalation Inhale 2 puffs into the lungs every 6 (six) hours as needed. For shortness of breath 1 Inhaler 6  . BUDESONIDE-FORMOTEROL FUMARATE 160-4.5 MCG/ACT IN AERO Inhalation Inhale 2 puffs into the lungs 2 (two) times daily. 1 Inhaler 12  . COENZYME Q10 30 MG PO CAPS Oral Take 30 mg by mouth daily.     Marland Kitchen EZETIMIBE-SIMVASTATIN 10-40 MG PO TABS Oral Take 1 tablet by mouth at bedtime. 90 tablet 3  . FUROSEMIDE 80 MG PO TABS Oral Take 80 mg by mouth 2 (two) times daily.    . INSULIN GLARGINE 100 UNIT/ML [place] SOLN Subcutaneous Inject 30 Units into the skin at bedtime as needed. 30 units at bedtime    .  INSULIN LISPRO (HUMAN) 100 UNIT/ML [place] SOLN  20 Units 3 (three) times daily before meals. Inject 20 units before each meal - if blood sugars more than 200 add 8 units.     Marland Kitchen METOLAZONE 2.5 MG PO TABS  Take one tablet by mouth on Mon/Wed/Fri 30 min prior to Furosemide 30 tablet 3  . METOPROLOL SUCCINATE ER 50 MG PO TB24 Oral Take 50 mg by mouth 2 (two) times daily.      Marland Kitchen RABEPRAZOLE SODIUM 20 MG PO TBEC Oral Take 20 mg by mouth daily.      Marland Kitchen TEMAZEPAM 30 MG PO CAPS      . TIOTROPIUM BROMIDE MONOHYDRATE 18 MCG IN CAPS Inhalation Place 1 capsule (18 mcg total) into inhaler and inhale daily. 90 capsule 1    . TRAMADOL HCL 50 MG PO TABS  TAKE 1 TABLET BY MOUTH EVERY 6 HOURS AS NEEDED FOR PAIN. DO NOT TAKE MORE THAN 8 TABLETS PER DAY 90 tablet 0  . AMOXICILLIN-POT CLAVULANATE 875-125 MG PO TABS Oral Take 1 tablet by mouth 2 (two) times daily. 14 tablet 0  . AMOXICILLIN-POT CLAVULANATE 875-125 MG PO TABS Oral Take 1 tablet by mouth 2 (two) times daily. 24 tablet 0  . METOLAZONE 2.5 MG PO TABS Oral Take 2.5 mg by mouth. Take one tablet by mouth on Mon/ Wed and Fri in the morning       BP 137/82  Pulse 80  Temp(Src) 97.5 F (36.4 C) (Oral)  Resp 20  SpO2 97%  Physical Exam  Nursing note and vitals reviewed. Constitutional: He appears well-developed and well-nourished. No distress.  HENT:  Head: Normocephalic and atraumatic.  Mouth/Throat: Oropharynx is clear and moist.       Anterior medial R nares with small abraded area. Mild oozing. No blood noted in posterior pharynx.  Eyes: Conjunctivae are normal. Right eye exhibits no discharge. Left eye exhibits no discharge.       Conjunctiva does not appear pale  Neck: Neck supple.  Cardiovascular: Normal rate, regular rhythm and normal heart sounds.  Exam reveals no gallop and no friction rub.   No murmur heard. Pulmonary/Chest: Effort normal and breath sounds normal. No respiratory distress.  Abdominal: Soft. He exhibits no distension. There is no tenderness.  Musculoskeletal: He exhibits no edema and no tenderness.  Neurological: He is alert.  Skin: Skin is warm and dry. He is not diaphoretic.  Psychiatric: He has a normal mood and affect. His behavior is normal. Thought content normal.      ED Course  Procedures (including critical care time)  Labs Reviewed  PROTIME-INR - Abnormal; Notable for the following:    Prothrombin Time 26.8 (*) RESULT CHECKED   INR 2.43 (*)    All other components within normal limits  CBC - Abnormal; Notable for the following:    WBC 10.6 (*)    Hemoglobin 12.6 (*)    MCH 25.0 (*)    RDW 16.6 (*)     All other components within normal limits  LAB REPORT - SCANNED   No results found.   1. Epistaxis       MDM   66-year-old male with epistaxis. Anterior bleed with was stopped with local pressure and neosynephrine. Pt observed in ED with no rebleed. INR therapeutic. Possibly from prong from nasal cannula irritating area. Pt's current O2 tubing old, hard and inflexible. New tubing provided.  Return precautions discussed.        Raeford Razor, MD 07/21/11  1653 

## 2011-07-21 ENCOUNTER — Ambulatory Visit (INDEPENDENT_AMBULATORY_CARE_PROVIDER_SITE_OTHER): Payer: Medicare Other | Admitting: *Deleted

## 2011-07-21 DIAGNOSIS — I4891 Unspecified atrial fibrillation: Secondary | ICD-10-CM

## 2011-07-21 DIAGNOSIS — I4892 Unspecified atrial flutter: Secondary | ICD-10-CM

## 2011-07-21 LAB — POCT INR: INR: 1.9

## 2011-07-22 ENCOUNTER — Other Ambulatory Visit: Payer: Self-pay | Admitting: Internal Medicine

## 2011-07-23 NOTE — Telephone Encounter (Signed)
Tramadol Rf done  restorils - too soon and should have RFs on hold at [facility] - that is where it was last filled at 05/18/11 # 60 2RF - should need Rf until June

## 2011-07-29 ENCOUNTER — Other Ambulatory Visit: Payer: Self-pay | Admitting: Internal Medicine

## 2011-08-07 ENCOUNTER — Ambulatory Visit (INDEPENDENT_AMBULATORY_CARE_PROVIDER_SITE_OTHER)
Admission: RE | Admit: 2011-08-07 | Discharge: 2011-08-07 | Disposition: A | Discharge status: Home / Self Care | Payer: Medicare Other | Source: Ambulatory Visit | Attending: Adult Health | Admitting: Adult Health

## 2011-08-07 ENCOUNTER — Ambulatory Visit (INDEPENDENT_AMBULATORY_CARE_PROVIDER_SITE_OTHER): Payer: Medicare Other | Admitting: Adult Health

## 2011-08-07 ENCOUNTER — Other Ambulatory Visit: Payer: Self-pay | Admitting: Internal Medicine

## 2011-08-07 ENCOUNTER — Encounter: Payer: Self-pay | Admitting: Adult Health

## 2011-08-07 VITALS — BP 110/64 | HR 83 | Temp 97.6°F | Ht 66.0 in | Wt 173.0 lb

## 2011-08-07 DIAGNOSIS — J449 Chronic obstructive pulmonary disease, unspecified: Secondary | ICD-10-CM

## 2011-08-07 DIAGNOSIS — I5042 Chronic combined systolic (congestive) and diastolic (congestive) heart failure: Secondary | ICD-10-CM

## 2011-08-07 NOTE — Patient Instructions (Signed)
Please Take Metolazone three days weekly as directed.  Low salt diet  Keep legs elevated.  Follow up Dr. Shelle Iron in 2 weeks and As needed   We are referring you back to Heart doctor  Please contact office for sooner follow up if symptoms do not improve or worsen or seek emergency care

## 2011-08-10 NOTE — Assessment & Plan Note (Signed)
?   Volume overloaded, he is on maximum lasix  Advised him on need to take his zaroxyln as directed to help prevent fluid accumulation  Will set him up for follow up with cardilogy  Check xray and labs today

## 2011-08-10 NOTE — Progress Notes (Signed)
  Subjective:    Patient ID: [name], male    DOB: [birth year], 66 y.o.   MRN: [id number]  HPI  66 yo male with known hx of COPD   5/17 /2013 Acute OV  Complains of increased SOB, dizziness, prod cough with white-beige mucus, chills x3weeks. No fever or discolored mucus.  Cough is minimally productive .  More edema than usual. More DOE, +orthopnea.  Has not been taking Metalozone as directed. Cant take when he is away from house.  Remains on Symbicort and Spiriva .    Review of Systems  Constitutional:   No  weight loss, night sweats,  Fevers, chills, + fatigue, or  lassitude.  HEENT:   No headaches,  Difficulty swallowing,  Tooth/dental problems, or  Sore throat,                No sneezing, itching, ear ache, nasal congestion, post nasal drip,   CV:  No chest pain,    + swelling in lower extremities,  NO anasarca,  palpitations, syncope.   GI  No heartburn, indigestion, abdominal pain, nausea, vomiting, diarrhea, change in bowel habits, loss of appetite, bloody stools.   Resp:  No coughing up of blood.  No change in color of mucus.  No wheezing.  No chest wall deformity  Skin: no rash or lesions.  GU: no dysuria, change in color of urine, no urgency or frequency.  No flank pain, no hematuria   MS:  No joint pain or swelling.  No decreased range of motion.     Psych:  No change in mood or affect. No depression or anxiety.  No memory loss.         Objective:   Physical Exam  GEN: A/Ox3; pleasant , NAD, elderly -   HEENT:  [place]/AT,  EACs-clear, TMs-wnl, NOSE-clear, THROAT-clear, no lesions, no postnasal drip or exudate noted.   NECK:  Supple w/ fair ROM; no JVD; normal carotid impulses w/o bruits; no thyromegaly or nodules palpated; no lymphadenopathy.  RESP  Coarse BS w/ scattered rhonchi .no accessory muscle use, no dullness to percussion  CARD:  RRR, no m/r/g  , 1+ peripheral edema, pulses intact, no cyanosis or clubbing.  GI:   Soft & nt; nml bowel  sounds; no organomegaly or masses detected.  Musco: Warm bil, no deformities or joint swelling noted.   Neuro: alert, no focal deficits noted.    Skin: Warm, no lesions or rashes '       Assessment & Plan:

## 2011-08-10 NOTE — Progress Notes (Signed)
Ov reviewed and agree with plan as outlined.  

## 2011-08-10 NOTE — Assessment & Plan Note (Signed)
COPD appears at his baseline without flare ? Volume overload related.  No evidence of infection  Will hold on steroids for now.   Plan  Check xray  Cont on current regimen follow up Dr. Shelle Iron in 4 weeks and As needed

## 2011-08-18 ENCOUNTER — Ambulatory Visit (INDEPENDENT_AMBULATORY_CARE_PROVIDER_SITE_OTHER): Payer: Medicare Other | Admitting: *Deleted

## 2011-08-18 DIAGNOSIS — I4892 Unspecified atrial flutter: Secondary | ICD-10-CM

## 2011-08-18 DIAGNOSIS — I4891 Unspecified atrial fibrillation: Secondary | ICD-10-CM

## 2011-08-19 ENCOUNTER — Ambulatory Visit (HOSPITAL_COMMUNITY)
Admission: RE | Admit: 2011-08-19 | Discharge: 2011-08-19 | Disposition: A | Discharge status: Home / Self Care | Payer: Medicare Other | Source: Ambulatory Visit | Attending: Internal Medicine | Admitting: Internal Medicine

## 2011-08-19 VITALS — BP 112/64 | HR 79 | Wt 164.0 lb

## 2011-08-19 DIAGNOSIS — J449 Chronic obstructive pulmonary disease, unspecified: Secondary | ICD-10-CM

## 2011-08-19 DIAGNOSIS — J4489 Other specified chronic obstructive pulmonary disease: Secondary | ICD-10-CM | POA: Insufficient documentation

## 2011-08-19 DIAGNOSIS — I5042 Chronic combined systolic (congestive) and diastolic (congestive) heart failure: Secondary | ICD-10-CM | POA: Insufficient documentation

## 2011-08-19 NOTE — Assessment & Plan Note (Signed)
No evidence of ischemia 

## 2011-08-19 NOTE — Assessment & Plan Note (Addendum)
Explained purpose of HF clinic. Reviewed medical records. Volume status mildly elevated. Continue current medications. Provided with scales and explained how to perform daily weights.  If he remains SOB with exertion may require RHC to further evaluate hemodynamics. He was unaware he needed to limit fluids and eat low salt foods.  > than 30 minutes spent educating him on low salt diet choices, daily weights, and limiting fluids to less than 2 liters per day. He was instructed to contact the HF clinic if he can not obtain his medications.  Follow up in 2 weeks.

## 2011-08-19 NOTE — Patient Instructions (Addendum)
Do the following things EVERYDAY: 1) Weigh yourself in the morning before breakfast. Write it down and keep it in a log. 2) Take your medicines as prescribed 3) Eat low salt foods--Limit salt (sodium) to 2000 mg per day.  4) Stay as active as you can everyday 5) Limit all fluids for the day to less than 2 liters    Follow up in 2 weeks 

## 2011-08-19 NOTE — Progress Notes (Signed)
Patient ID: [name], male   DOB: [birth year], 66 y.o.   MRN: [id number] Referring Physician: Dr Shelle Iron EP: Dr [name] Pulmonologist: Dr Shelle Iron Cardiologist:  PCP: Dr Lesia Hausen  HPI: 66 year old male with a history of  ischemic cardiomyopathy, previous EF 25%, persistent atrial fibrillation, chronic systolic heart failure, DM, and COPD (chroinc O2). He has a history of stent to the obtuse marginal. Last heart catheterization 12/06: Mid to distal LAD 30%, D2 20%, proximal circumflex 30%, OM 2 stents patent, ostial RCA 50%, mid RCA 40%, distal RCA multiple 40%. He has been in and out of atrial fibrillation for several years.  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 Off amiodarone. Quit drinking alcohol 6 years ago. Quit smoking 6 years ago.   04/27/11 ECHO EF45% to 50%. Diffuse hypokinesis. Grade 2 diastolic dysfunction   He is referred to the heart failure clinic Dr Shelle Iron. Denies CP/PND. Exertional dyspnea. + Orthopnea . He is exhausted after he performs ADLs. Sleeps on 4 pillows. Poor appetite. Chronic lower extremity edema. He continues to take Lasix 80 mg twice a day and metolazone 2.5 mg three times per week. Misses medications at least once a week. He has a hard time paying for his medications.  He does not follow low salt diet and eats soup daily. Drinks > than 2 liters per day.  He does not weigh at home. Lives at home with his wife.    Review of Systems:     Cardiac Review of Systems: {Y] = yes [ ]  = no  Chest Pain [    ]  Resting SOB [ Y  ] Exertional SOB  [  ]  Orthopnea [  ]   Pedal Edema [   ]    Palpitations [  ] Syncope  [  ]   Presyncope [   ]  General Review of Systems: [Y] = yes [  ]=no Constitional: recent weight change [  ]; anorexia [  ]; fatigue [ Y ]; nausea [  ]; night sweats [  ]; fever [  ]; or chills [  ];                                                                                                                                           Dental: poor dentition[  ]; Last Dentist visit:   Eye : blurred vision [  ]; diplopia [   ]; vision changes [  ];  Amaurosis fugax[  ]; Resp: cough [ Y ];  wheezing[  ];  hemoptysis[  ]; shortness of breath[Y  ]; paroxysmal nocturnal dyspnea[  ]; dyspnea on exertion[Y  ]; or orthopnea[y  ];  GI:  gallstones[  ], vomiting[  ];  dysphagia[  ]; melena[  ];  hematochezia [  ]; heartburn[  ];   Hx of  Colonoscopy[  ]; GU: kidney stones [  ]; hematuria[  ];   dysuria [  ];  nocturia[  ];  history of     obstruction [  ];                 Skin: rash, swelling[ Y ];, hair loss[  ];  peripheral edema[  ];  or itching[  ]; Musculosketetal: myalgias[  ];  joint swelling[Y  ];  joint erythema[  ];  joint pain[  ];  back pain[  ];  Heme/Lymph: bruising[  ];  bleeding[  ];  anemia[  ];  Neuro: TIA[  ];  headaches[  ];  stroke[  ];  vertigo[  ];  seizures[  ];   paresthesias[  ];  difficulty walking[  ];  Psych:depression[ Y ]; anxiety[ Y ];  Endocrine: diabetes[ Y ];  thyroid dysfunction[  ];  Immunizations: Flu [  ]; Pneumococcal[  ];  Other:    Past Medical History  Diagnosis Date  . ALCOHOL ABUSE, HX OF 04/30/2008  . 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 Atrial flutter 09/20/2006  . 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 Chronic systolic heart failure 09/20/2006    previous EF 25%;  Echocardiogram 12/10/10: Mild-moderate LVH, EF 50-55%, mild MR, moderate LAE, moderate RAE, mild RVE with moderately reduced RV SF, PASP 50, small pericardial effusion  . COPD 12/17/2006  . CORONARY ARTERY DISEASE 09/20/2006    s/p stent to OM;  catheterization 12/06: Mid to distal LAD 30%, D2 20%, proximal circumflex 30%, OM 2 stents patent, ostial RCA 50%, mid RCA 40%, distal RCA multiple 40%  . 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 DYSPEPSIA&OTHER [facility] DISORDERS FUNCTION STOMACH 04/04/2009  . HYPERTENSION  12/17/2006  . OBESITY 04/30/2008  . HLD (hyperlipidemia) 09/20/2006  . OTITIS EXTERNA, CHRONIC NEC 12/28/2006  . PERSONAL HX COLONIC POLYPS 05/02/2009  . PSORIASIS 04/30/2008  . CKD (chronic kidney disease) 04/30/2008    creatinine 2.2 range    Current Outpatient Prescriptions  Medication Sig Dispense Refill  . albuterol (PROAIR HFA) 108 (90 BASE) MCG/ACT inhaler Inhale 2 puffs into the lungs every 6 (six) hours as needed. For shortness of breath  1 Inhaler  6  . budesonide-formoterol (SYMBICORT) 160-4.5 MCG/ACT inhaler Inhale 2 puffs into the lungs 2 (two) times daily.  1 Inhaler  12  . co-enzyme Q-10 30 MG capsule Take 30 mg by mouth daily.       Marland Kitchen ezetimibe-simvastatin (VYTORIN) 10-40 MG per tablet Take 1 tablet by mouth at bedtime.  90 tablet  3  . furosemide (LASIX) 80 MG tablet Take 80 mg by mouth 2 (two) times daily.      . hydrocortisone cream 1 % Apply to affected area 2 times daily  15 g  0  . insulin glargine (LANTUS) 100 UNIT/ML injection Inject 30 Units into the skin at bedtime.       . insulin lispro (HUMALOG) 100 UNIT/ML injection 20 Units 3 (three) times daily before meals. Inject 20 units before each meal - if blood sugars more than 200 add 8 units.       . metolazone (ZAROXOLYN) 2.5 MG tablet Take one tablet by mouth on Mon/Wed/Fri 30 min prior to Furosemide  30 tablet  3  . metoprolol (TOPROL-XL) 50 MG 24 hr tablet 1/2 tab in the morning and 1 whole tab in the evening      .  RABEprazole (ACIPHEX) 20 MG tablet Take 20 mg by mouth daily.        . temazepam (RESTORIL) 30 MG capsule Take 30 mg by mouth at bedtime as needed.       . tiotropium (SPIRIVA) 18 MCG inhalation capsule Place 18 mcg into inhaler and inhale daily.      . traMADol (ULTRAM) 50 MG tablet TAKE 1 TABLET BY MOUTH EVERY 6 HOURS AS NEEDED FOR PAIN. DO NOT TAKE MORE THAN 8 TABLETS PER DAY  90 tablet  0  . DISCONTD: insulin glargine (LANTUS) 100 UNIT/ML injection 40 units at bedtime  10 mL  3  . DISCONTD: insulin  glargine (LANTUS) 100 UNIT/ML injection 30 units at bedtime  10 mL  3  . DISCONTD: metolazone (ZAROXOLYN) 2.5 MG tablet Take 1 tablet (2.5 mg total) by mouth daily.  30 tablet  5     Allergies  Allergen Reactions  . Aspirin     REACTION: Swelling, trouble breathing  . Tetracycline Hcl     REACTION: Swelling, trouble breathing    History   Social History  . Marital Status: Married    Spouse Name: N/A    Number of Children: 0  . Years of Education: N/A   Occupational History  . [employer]    Social History Main Topics  . Smoking status: Former Smoker -- 1.0 packs/day for 50 years    Types: Cigarettes    Quit date: 03/23/2005  . Smokeless tobacco: Never Used  . Alcohol Use: No     quit drinking 2007  . Drug Use: No  . Sexually Active: Not on file   Other Topics Concern  . Not on file   Social History Narrative  . No narrative on file    Family History  Problem Relation Age of Onset  . Lung cancer Mother     and aunt  . Heart attack Father   . Diabetes Sister     and aunt  . Colon cancer Neg Hx     PHYSICAL EXAM: Filed Vitals:   08/19/11 1339  BP: 112/64  Pulse: 79   General: Chronically ill  appearing. No respiratory difficulty Wife present HEENT: normal Neck: supple.  JVD 8-9 Carotids 2+ bilat; no bruits. No lymphadenopathy or thryomegaly appreciated. Cor: PMI nondisplaced. Regular rate & rhythm. No rubs, gallops or murmurs. Lungs: on 2 liters [place] LLL RLL diminished Abdomen: soft, nontender, nondistended. No hepatosplenomegaly. No bruits or masses. Good bowel sounds. Extremities: no cyanosis, clubbing, rash, R and LLE 2+ edema RUE and LUE scabs noted Neuro: alert & oriented x 3, cranial nerves grossly intact. moves all 4 extremities w/o difficulty. Affect pleasant.  ECG:  Results for orders placed in visit on 08/18/11 (from the past 24 hour(s))  POCT INR     Status: Normal   Collection Time   08/18/11  2:40 PM      Component Value Range   INR 2.5      No results found.   ASSESSMENT & PLAN:

## 2011-08-19 NOTE — Assessment & Plan Note (Signed)
Stable  Continue current regimen  

## 2011-08-20 ENCOUNTER — Other Ambulatory Visit: Payer: Self-pay | Admitting: Internal Medicine

## 2011-08-28 ENCOUNTER — Ambulatory Visit: Payer: Medicare Other | Admitting: Pulmonary Disease

## 2011-08-28 ENCOUNTER — Telehealth: Payer: Self-pay | Admitting: Pulmonary Disease

## 2011-08-28 MED ORDER — BUDESONIDE-FORMOTEROL FUMARATE 160-4.5 MCG/ACT IN AERO: 2.0000 | INHALATION_SPRAY | Freq: Two times a day (BID) | RESPIRATORY_TRACT | Status: AC

## 2011-08-28 NOTE — Telephone Encounter (Signed)
Called the number given for MAP and LMOM with prescription refill for symbicort. LMOM for [facility] notifying her that this was done.

## 2011-09-03 ENCOUNTER — Other Ambulatory Visit: Payer: Self-pay | Admitting: Internal Medicine

## 2011-09-04 ENCOUNTER — Other Ambulatory Visit: Payer: Self-pay | Admitting: Internal Medicine

## 2011-09-07 ENCOUNTER — Encounter (HOSPITAL_COMMUNITY): Payer: Self-pay

## 2011-09-07 ENCOUNTER — Ambulatory Visit (HOSPITAL_COMMUNITY)
Admission: RE | Admit: 2011-09-07 | Discharge: 2011-09-07 | Disposition: A | Discharge status: Home / Self Care | Payer: Medicare Other | Source: Ambulatory Visit | Attending: Internal Medicine | Admitting: Internal Medicine

## 2011-09-07 VITALS — BP 120/70 | HR 82 | Ht 66.0 in | Wt 167.1 lb

## 2011-09-07 DIAGNOSIS — I509 Heart failure, unspecified: Secondary | ICD-10-CM

## 2011-09-07 DIAGNOSIS — I5042 Chronic combined systolic (congestive) and diastolic (congestive) heart failure: Secondary | ICD-10-CM

## 2011-09-07 NOTE — Patient Instructions (Addendum)
If weight is 165 pounds or more then take extra metolazone 2.5 mg.    Follow up in 1 month.    Labs today.

## 2011-09-08 ENCOUNTER — Other Ambulatory Visit (INDEPENDENT_AMBULATORY_CARE_PROVIDER_SITE_OTHER): Payer: Medicare Other

## 2011-09-08 ENCOUNTER — Other Ambulatory Visit: Payer: Self-pay | Admitting: Internal Medicine

## 2011-09-08 DIAGNOSIS — I509 Heart failure, unspecified: Secondary | ICD-10-CM

## 2011-09-08 DIAGNOSIS — I5042 Chronic combined systolic (congestive) and diastolic (congestive) heart failure: Secondary | ICD-10-CM

## 2011-09-08 LAB — BASIC METABOLIC PANEL
BUN: 62 mg/dL — ABNORMAL HIGH (ref 6–23)
Creatinine, Ser: 1.9 mg/dL — ABNORMAL HIGH (ref 0.4–1.5)
GFR: 37.71 mL/min — ABNORMAL LOW (ref >60.00)
Glucose, Bld: 207 mg/dL — ABNORMAL HIGH (ref 70–99)
Potassium: 4 mEq/L (ref 3.5–5.1)

## 2011-09-11 ENCOUNTER — Other Ambulatory Visit: Payer: Self-pay | Admitting: Internal Medicine

## 2011-09-15 ENCOUNTER — Ambulatory Visit (INDEPENDENT_AMBULATORY_CARE_PROVIDER_SITE_OTHER): Payer: Medicare Other | Admitting: *Deleted

## 2011-09-15 DIAGNOSIS — I4892 Unspecified atrial flutter: Secondary | ICD-10-CM

## 2011-09-15 DIAGNOSIS — I4891 Unspecified atrial fibrillation: Secondary | ICD-10-CM

## 2011-09-15 LAB — POCT INR: INR: 3

## 2011-09-27 ENCOUNTER — Other Ambulatory Visit: Payer: Self-pay | Admitting: Internal Medicine

## 2011-09-28 NOTE — Assessment & Plan Note (Signed)
Very difficult situation due to financial and social constraints. Volume status mildly elevated in setting of running out of meds at times and dietary non-compliance. Reinforced need for daily weights and reviewed use of sliding scale diuretics. Will take metolazone 2.5 today and whenever weight is 165 or greater. Will have SW and pharmacy see to help come up with a better plan for his meds.

## 2011-09-28 NOTE — Progress Notes (Signed)
Referring Physician: Dr Shelle Iron EP: Dr [name] Pulmonologist: Dr Shelle Iron Cardiologist: Dr. [name] PCP: Dr Lesia Hausen  HPI: [name] is a 66 year old male with a history of  systolic HF due to an ischemic cardiomyopathy, previous EF 25% (now 45-50%), persistent atrial fibrillation s/p AVN ablation and BIV pacer, DM, and COPD (chroinc O2). He has a history of CAD with a stent to the obtuse marginal. Last heart catheterization 12/06: Mid to distal LAD 30%, D2 20%, proximal circumflex 30%, OM 2 stents patent, ostial RCA 50%, mid RCA 40%, distal RCA multiple 40%. Quit drinking alcohol 6 years ago. Quit smoking 6 years ago.   02/2011 PFTs: FVC 1.64 (42% pred), FEV1 0.97 (36% pred), FEV1/FEV 59%, FEF 25-75 %   DLCO 47% 04/27/11 ECHO EF45% to 50%. Diffuse hypokinesis. Grade 2 diastolic dysfunction   He returns for follow up today.  He is a little more short of breath.  Increased stress due to financial situation.  He runs out of meds due to running out of money between pay checks.  Getting ready to run out of Kdur and coumadin.  Can't tell difference in days he takes metolazone.  Drinks at least 2 liters a day.  3 pillow orthopnea.  -PND.  Fatigued during the day.  He is weighing at home, 159-164 pounds.   Unable to afford low sodium diet.  Lives at home with wife.  +Lower extremity edema.   Review of Systems:  All pertinent positives and negatives as in HPI, otherwise negative.      Past Medical History  Diagnosis Date  . ALCOHOL ABUSE, HX OF 04/30/2008  . 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 Atrial flutter 09/20/2006  . 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 Chronic systolic heart failure 09/20/2006    previous EF 25%;  Echocardiogram 12/10/10: Mild-moderate LVH, EF 50-55%, mild MR, moderate LAE, moderate RAE, mild RVE with moderately reduced RV SF, PASP 50, small pericardial effusion  . COPD 12/17/2006  . CORONARY ARTERY DISEASE 09/20/2006    s/p stent  to OM;  catheterization 12/06: Mid to distal LAD 30%, D2 20%, proximal circumflex 30%, OM 2 stents patent, ostial RCA 50%, mid RCA 40%, distal RCA multiple 40%  . 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 DYSPEPSIA&OTHER [facility] DISORDERS FUNCTION STOMACH 04/04/2009  . HYPERTENSION 12/17/2006  . OBESITY 04/30/2008  . HLD (hyperlipidemia) 09/20/2006  . OTITIS EXTERNA, CHRONIC NEC 12/28/2006  . PERSONAL HX COLONIC POLYPS 05/02/2009  . PSORIASIS 04/30/2008  . CKD (chronic kidney disease) 04/30/2008    creatinine 2.2 range    Current Outpatient Prescriptions  Medication Sig Dispense Refill  . albuterol (PROAIR HFA) 108 (90 BASE) MCG/ACT inhaler Inhale 2 puffs into the lungs every 6 (six) hours as needed. For shortness of breath  1 Inhaler  6  . budesonide-formoterol (SYMBICORT) 160-4.5 MCG/ACT inhaler Inhale 2 puffs into the lungs 2 (two) times daily.  3 Inhaler  1  . ezetimibe-simvastatin (VYTORIN) 10-40 MG per tablet Take 1 tablet by mouth at bedtime.  90 tablet  3  . furosemide (LASIX) 80 MG tablet Take 80 mg by mouth 2 (two) times daily.      . insulin glargine (LANTUS) 100 UNIT/ML injection Inject 30 Units into the skin at bedtime. If sugar is around 100, takes half the dose.      . insulin lispro (HUMALOG) 100 UNIT/ML injection 20 Units 3 (three) times daily  before meals. Inject 20 units before each meal - if blood sugars more than 200 add 8 units.       . metolazone (ZAROXOLYN) 2.5 MG tablet Take one tablet by mouth on Mon/Wed/Fri 30 min prior to Furosemide  30 tablet  3  . metoprolol (TOPROL-XL) 50 MG 24 hr tablet 1/2 tab in the morning and 1 whole tab in the evening      . potassium chloride SA (K-DUR,KLOR-CON) 20 MEQ tablet Take 20-30 mEq by mouth daily. 1.5 tablets in the AM, 1 tablet in the PM      . RABEprazole (ACIPHEX) 20 MG tablet Take 20 mg by mouth daily.        Marland Kitchen tiotropium (SPIRIVA) 18 MCG inhalation capsule Place 18 mcg into inhaler and inhale  daily.      Marland Kitchen warfarin (COUMADIN) 5 MG tablet Take 2.5 mg by mouth daily. As directed      . co-enzyme Q-10 30 MG capsule Take 30 mg by mouth daily.       . hydrocortisone cream 1 % Apply to affected area 2 times daily  15 g  0  . potassium chloride SA (K-DUR,KLOR-CON) 20 MEQ tablet Take 40 mEq by mouth daily.      . temazepam (RESTORIL) 30 MG capsule TAKE ONE CAPSULE BY MOUTH AT BEDTIME  90 capsule  0  . traMADol (ULTRAM) 50 MG tablet TAKE 1 TABLET BY MOUTH EVERY 6 HOURS AS NEEDED FOR PAIN  90 tablet  0  . warfarin (COUMADIN) 5 MG tablet Take 2.5 mg by mouth as directed. Takes on Monday, Wednesday, Friday and Sunday.      . warfarin (COUMADIN) 5 MG tablet TAKE ONE TABLET BY MOUTH DAILY  90 tablet  0  . DISCONTD: insulin glargine (LANTUS) 100 UNIT/ML injection 40 units at bedtime  10 mL  3  . DISCONTD: insulin glargine (LANTUS) 100 UNIT/ML injection 30 units at bedtime  10 mL  3  . DISCONTD: metolazone (ZAROXOLYN) 2.5 MG tablet Take 1 tablet (2.5 mg total) by mouth daily.  30 tablet  5  . DISCONTD: potassium chloride SA (K-DUR,KLOR-CON) 20 MEQ tablet Take 2 tablets (40 mEq total) by mouth daily.  90 tablet  1     Allergies  Allergen Reactions  . Aspirin     REACTION: Swelling, trouble breathing  . Tetracycline Hcl     REACTION: Swelling, trouble breathing    PHYSICAL EXAM: Filed Vitals:   09/07/11 1506  BP: 120/70  Pulse: 82  Height: 5\' 6"  (1.676 m)  Weight: 167 lb 1.9 oz (75.805 kg)  SpO2: 95%    General: Chronically ill  appearing. No respiratory difficulty Wife present HEENT: normal Neck: supple.  JVD 8-9 Carotids 2+ bilat; no bruits. No lymphadenopathy or thryomegaly appreciated. Cor: PMI nondisplaced. Regular rate & rhythm. No rubs, gallops or murmurs. Lungs: on 2 liters by n/c, LLL RLL diminished Abdomen: soft, nontender, nondistended. No hepatosplenomegaly. No bruits or masses. Good bowel sounds. Extremities: no cyanosis, clubbing, rash, R and LLE 2+ edema RUE and LUE  scabs noted Neuro: alert & oriented x 3, cranial nerves grossly intact. moves all 4 extremities w/o difficulty. Affect pleasant.   ASSESSMENT & PLAN:

## 2011-09-29 ENCOUNTER — Other Ambulatory Visit: Payer: Self-pay

## 2011-09-29 MED ORDER — RABEPRAZOLE SODIUM 20 MG PO TBEC: 20.0000 mg | DELAYED_RELEASE_TABLET | Freq: Every day | ORAL | Status: AC

## 2011-09-30 ENCOUNTER — Other Ambulatory Visit: Payer: Self-pay | Admitting: Internal Medicine

## 2011-10-01 NOTE — Telephone Encounter (Signed)
Got refills request for this med [facility] - refused to both - attempt to call pt at home and cell - VM at both [facility] must pick 1 pharmacy to have controls filled at all the time

## 2011-10-02 ENCOUNTER — Other Ambulatory Visit: Payer: Self-pay

## 2011-10-02 ENCOUNTER — Telehealth: Payer: Self-pay | Admitting: Pulmonary Disease

## 2011-10-02 MED ORDER — TEMAZEPAM 30 MG PO CAPS: 30.0000 mg | ORAL_CAPSULE | Freq: Every evening | ORAL | Status: DC | PRN

## 2011-10-02 NOTE — Telephone Encounter (Signed)
Spoke with wife explained that we need controls filled at 1 phamacy - they would like use [facility] Rf on restoril done

## 2011-10-02 NOTE — Telephone Encounter (Signed)
Per fax:  "Pt's wife reports husband is not benefiting from symbicort & has increased phlegm/mucus he is coughing up-states pt wishes to switch back to adviar-advised wife to contact your office, but she insisted we [[facility]] ask for change in med first-pleasvise-thx!  Antionette Fairy

## 2011-10-02 NOTE — Telephone Encounter (Signed)
lmomtcb for the pt to discuss problems with symbicort.

## 2011-10-05 NOTE — Telephone Encounter (Signed)
LMOMTCB x 1 

## 2011-10-05 NOTE — Telephone Encounter (Signed)
Returning call.  Rosey Bath w/ [facility].  [phone]

## 2011-10-05 NOTE — Telephone Encounter (Signed)
Pt states he does not want to restart on Adviar and wishes to stay on Symbicort. He says the Advairr caused him to cough and he has been doing fine on the Symbicort. Pt asked that we call the [facility] to refill his Symbicort and rescue inhaler. LMOMTCB for pharmacy.

## 2011-10-06 ENCOUNTER — Ambulatory Visit: Payer: Medicare Other | Admitting: Pulmonary Disease

## 2011-10-06 NOTE — Telephone Encounter (Signed)
Pharmacy is aware. [name], CMA  

## 2011-10-09 ENCOUNTER — Ambulatory Visit (INDEPENDENT_AMBULATORY_CARE_PROVIDER_SITE_OTHER): Payer: Medicare Other | Admitting: Pulmonary Disease

## 2011-10-09 ENCOUNTER — Encounter: Payer: Self-pay | Admitting: Pulmonary Disease

## 2011-10-09 VITALS — BP 110/78 | HR 72 | Temp 98.1°F | Ht 66.0 in | Wt 168.6 lb

## 2011-10-09 DIAGNOSIS — J449 Chronic obstructive pulmonary disease, unspecified: Secondary | ICD-10-CM

## 2011-10-09 DIAGNOSIS — J9610 Chronic respiratory failure, unspecified whether with hypoxia or hypercapnia: Secondary | ICD-10-CM

## 2011-10-09 NOTE — Patient Instructions (Addendum)
No change in breathing medications Try to stay as active as possible Stay on oxygen with any exertion and while sleeping.  Can try zyrtec over the counter 10mg  at bedtime for nasal drip followup with me in 6mos.

## 2011-10-09 NOTE — Progress Notes (Signed)
  Subjective:    Patient ID: [name], male    DOB: [birth year], 66 y.o.   MRN: [id number]  HPI The patient comes in today for followup of his known COPD.  He also has a cardiomyopathy with chronic congestive heart failure.  He was last seen in the office by our nurse practitioner, and found to have decompensated heart failure with edema on chest x-ray.  He was seen by cardiology with adjustments made to his diuretic regimen, and now he is much improved.  He has seen worsening of his breathing since the heat and humidity have become more common, but feels that he is in a reasonable range overall.  He has minimal cough and only occasionally brings up small quantities of discolored mucus.   Review of Systems  Constitutional: Negative for fever and unexpected weight change.  HENT: Negative for ear pain, nosebleeds, congestion, sore throat, rhinorrhea, sneezing, trouble swallowing, dental problem, postnasal drip and sinus pressure.   Eyes: Negative for redness and itching.  Respiratory: Positive for cough and shortness of breath. Negative for chest tightness and wheezing.   Cardiovascular: Negative for palpitations and leg swelling.  Gastrointestinal: Negative for nausea and vomiting.  Genitourinary: Negative for dysuria.  Musculoskeletal: Negative for joint swelling.  Skin: Negative for rash.  Neurological: Negative for headaches.  Hematological: Does not bruise/bleed easily.  Psychiatric/Behavioral: Negative for dysphoric mood. The patient is not nervous/anxious.   All other systems reviewed and are negative.       Objective:   Physical Exam Frail-appearing male in no acute distress Nose without purulence or discharge noted Chest with right basilar crackles, decreased breath sounds, but no wheezing Heart exam regular rate and rhythm Lower extremities with 3+ edema bilaterally, no cyanosis Alert and oriented, moves all 4 extremities.       Assessment & Plan:

## 2011-10-09 NOTE — Assessment & Plan Note (Signed)
The patient appears to be stable from a pulmonary standpoint.  I have stressed to him the need to stay on oxygen with any exertion and while sleeping, and to stay out of the heat as much as possible.  He is to continue on his current bronchodilator regimen.

## 2011-10-12 ENCOUNTER — Telehealth: Payer: Self-pay | Admitting: Internal Medicine

## 2011-10-12 NOTE — Telephone Encounter (Signed)
Pts wife called re: pt having frequent urination. 15 to 20 times a night, but not a lot each time. ? Problems with prostate. Req to get referral to Prostate Specialist. Req to get a call back today asap.

## 2011-10-12 NOTE — Telephone Encounter (Signed)
Please call and schedule rov to eval

## 2011-10-12 NOTE — Telephone Encounter (Signed)
Please advise 

## 2011-10-12 NOTE — Telephone Encounter (Signed)
Suggest office visit here tomorrow to rule out a simple bladder infection

## 2011-10-13 ENCOUNTER — Ambulatory Visit (HOSPITAL_COMMUNITY)
Admission: RE | Admit: 2011-10-13 | Discharge: 2011-10-13 | Disposition: A | Discharge status: Home / Self Care | Payer: Medicare Other | Source: Ambulatory Visit | Attending: Internal Medicine | Admitting: Internal Medicine

## 2011-10-13 ENCOUNTER — Ambulatory Visit (INDEPENDENT_AMBULATORY_CARE_PROVIDER_SITE_OTHER): Payer: Medicare Other | Admitting: *Deleted

## 2011-10-13 VITALS — HR 79

## 2011-10-13 DIAGNOSIS — I5042 Chronic combined systolic (congestive) and diastolic (congestive) heart failure: Secondary | ICD-10-CM | POA: Insufficient documentation

## 2011-10-13 DIAGNOSIS — I4892 Unspecified atrial flutter: Secondary | ICD-10-CM

## 2011-10-13 DIAGNOSIS — R300 Dysuria: Secondary | ICD-10-CM | POA: Insufficient documentation

## 2011-10-13 DIAGNOSIS — I509 Heart failure, unspecified: Secondary | ICD-10-CM

## 2011-10-13 DIAGNOSIS — I4891 Unspecified atrial fibrillation: Secondary | ICD-10-CM

## 2011-10-13 DIAGNOSIS — N343 Urethral syndrome, unspecified: Secondary | ICD-10-CM | POA: Insufficient documentation

## 2011-10-13 NOTE — Patient Instructions (Addendum)
Follow up in 2 weeks  Do the following things EVERYDAY: 1) Weigh yourself in the morning before breakfast. Write it down and keep it in a log. 2) Take your medicines as prescribed 3) Eat low salt foods--Limit salt (sodium) to 2000 mg per day.  4) Stay as active as you can everyday 5) Limit all fluids for the day to less than 2 liters 

## 2011-10-13 NOTE — Progress Notes (Signed)
Patient ID: [name], male   DOB: 03-29-1945, 66 y.o.   MRN: [id number] Referring Physician: Dr Shelle Iron EP: Dr [name] Pulmonologist: Dr Shelle Iron Cardiologist: Dr. [name] PCP: Dr Lesia Hausen  HPI: [name] is a 66 year old male with a history of  systolic HF due to an ischemic cardiomyopathy, previous EF 25% (now 45-50%), persistent atrial fibrillation s/p AVN ablation and BIV pacer, DM, and COPD (chroinc O2). He has a history of CAD with a stent to the obtuse marginal. Last heart catheterization 12/06: Mid to distal LAD 30%, D2 20%, proximal circumflex 30%, OM 2 stents patent, ostial RCA 50%, mid RCA 40%, distal RCA multiple 40%. Quit drinking alcohol 6 years ago. Quit smoking 6 years ago.   02/2011 PFTs: FVC 1.64 (42% pred), FEV1 0.97 (36% pred), FEV1/FEV 59%, FEF 25-75 %   DLCO 47% 04/27/11 ECHO EF45% to 50%. Diffuse hypokinesis. Grade 2 diastolic dysfunction   He returns for follow up today. Last visit he was instructed to take an additional Metolazone 2.5 mg if weight 165 pounds or greater. He has required one extra Metolazone. Weight at home 158-167 pounds. Poor appetite. Complains of difficulty urinating. Frequent urination with small amounts. Dyspnea with exertion. Denies PND. + Orthopnea. Complains of lower extremity edema. Eats soup twice a week. He did not take any medications today. He did not take Metolazone on Monday.     Review of Systems:  All pertinent positives and negatives as in HPI, otherwise negative.      Past Medical History  Diagnosis Date  . ALCOHOL ABUSE, HX OF 04/30/2008  . 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 Atrial flutter 09/20/2006  . 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 Chronic systolic heart failure 09/20/2006    previous EF 25%;  Echocardiogram 12/10/10: Mild-moderate LVH, EF 50-55%, mild MR, moderate LAE, moderate RAE, mild RVE with moderately reduced RV SF, PASP 50, small pericardial effusion  . COPD  12/17/2006  . CORONARY ARTERY DISEASE 09/20/2006    s/p stent to OM;  catheterization 12/06: Mid to distal LAD 30%, D2 20%, proximal circumflex 30%, OM 2 stents patent, ostial RCA 50%, mid RCA 40%, distal RCA multiple 40%  . 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 DYSPEPSIA&OTHER [facility] DISORDERS FUNCTION STOMACH 04/04/2009  . HYPERTENSION 12/17/2006  . OBESITY 04/30/2008  . HLD (hyperlipidemia) 09/20/2006  . OTITIS EXTERNA, CHRONIC NEC 12/28/2006  . PERSONAL HX COLONIC POLYPS 05/02/2009  . PSORIASIS 04/30/2008  . CKD (chronic kidney disease) 04/30/2008    creatinine 2.2 range    Current Outpatient Prescriptions  Medication Sig Dispense Refill  . albuterol (PROAIR HFA) 108 (90 BASE) MCG/ACT inhaler Inhale 2 puffs into the lungs every 6 (six) hours as needed. For shortness of breath  1 Inhaler  6  . budesonide-formoterol (SYMBICORT) 160-4.5 MCG/ACT inhaler Inhale 2 puffs into the lungs 2 (two) times daily.  3 Inhaler  1  . co-enzyme Q-10 30 MG capsule Take 30 mg by mouth daily.       Marland Kitchen ezetimibe-simvastatin (VYTORIN) 10-40 MG per tablet Take 1 tablet by mouth at bedtime.  90 tablet  3  . furosemide (LASIX) 80 MG tablet Take 80 mg by mouth 2 (two) times daily.      . hydrocortisone cream 1 % Apply to affected area 2 times daily  15 g  0  . insulin glargine (LANTUS) 100 UNIT/ML injection Inject 30 Units into the  skin at bedtime. If sugar is around 100, takes half the dose.      . insulin lispro (HUMALOG) 100 UNIT/ML injection 20 Units 3 (three) times daily before meals. Inject 20 units before each meal - if blood sugars more than 200 add 8 units.       . metolazone (ZAROXOLYN) 2.5 MG tablet Take one tablet by mouth on Mon/Wed/Fri 30 min prior to Furosemide  30 tablet  3  . metoprolol (TOPROL-XL) 50 MG 24 hr tablet 1/2 tab in the morning and 1 whole tab in the evening      . potassium chloride SA (K-DUR,KLOR-CON) 20 MEQ tablet Take 20-30 mEq by mouth daily. 1.5  tablets in the AM, 1 tablet in the PM      . RABEprazole (ACIPHEX) 20 MG tablet Take 1 tablet (20 mg total) by mouth daily.  90 tablet  3  . temazepam (RESTORIL) 30 MG capsule Take 1 capsule (30 mg total) by mouth at bedtime as needed for sleep.  90 capsule  0  . tiotropium (SPIRIVA) 18 MCG inhalation capsule Place 18 mcg into inhaler and inhale daily.      . traMADol (ULTRAM) 50 MG tablet TAKE 1 TABLET BY MOUTH EVERY 6 HOURS AS NEEDED FOR PAIN  90 tablet  0  . warfarin (COUMADIN) 5 MG tablet TAKE ONE TABLET BY MOUTH DAILY  90 tablet  0  . DISCONTD: insulin glargine (LANTUS) 100 UNIT/ML injection 40 units at bedtime  10 mL  3  . DISCONTD: insulin glargine (LANTUS) 100 UNIT/ML injection 30 units at bedtime  10 mL  3  . DISCONTD: metolazone (ZAROXOLYN) 2.5 MG tablet Take 1 tablet (2.5 mg total) by mouth daily.  30 tablet  5  . DISCONTD: potassium chloride SA (K-DUR,KLOR-CON) 20 MEQ tablet Take 2 tablets (40 mEq total) by mouth daily.  90 tablet  1     Allergies  Allergen Reactions  . Aspirin     REACTION: Swelling, trouble breathing  . Tetracycline Hcl     REACTION: Swelling, trouble breathing    PHYSICAL EXAM: Filed Vitals:   10/13/11 1452  Pulse: 79  SpO2: 97%    General: Chronically ill  appearing. No respiratory difficulty Wife present HEENT: normal Neck: supple.  JVD 10-11 Carotids 2+ bilat; no bruits. No lymphadenopathy or thryomegaly appreciated. Cor: PMI nondisplaced. Regular rate & rhythm. No rubs, gallops or murmurs. Lungs: on 2 liters by n/c Abdomen: soft, nontender, nondistended. No hepatosplenomegaly. No bruits or masses. Good bowel sounds. Extremities: no cyanosis, clubbing, rash, R and LLE 3+ edema RUE and LUE scabs noted Neuro: alert & oriented x 3, cranial nerves grossly intact. moves all 4 extremities w/o difficulty. Affect pleasant.   ASSESSMENT & PLAN:

## 2011-10-13 NOTE — Assessment & Plan Note (Addendum)
Volume status elevated however he did not take his diuretics today. Take additional Metolazone 2.5 mg today. Reinforced medications compliance and low salt food choices. Follow up in 2 weeks.

## 2011-10-13 NOTE — Assessment & Plan Note (Signed)
Follow up with PCP today for UA and assessment.

## 2011-10-13 NOTE — Telephone Encounter (Signed)
Lft vm for pt to sch ov with Dr Amador Cunas as noted.

## 2011-10-14 NOTE — Telephone Encounter (Signed)
Called and lft another vm for pt to sch ov as noted.

## 2011-10-19 ENCOUNTER — Ambulatory Visit: Payer: Medicare Other | Admitting: Internal Medicine

## 2011-10-20 ENCOUNTER — Encounter: Payer: Self-pay | Admitting: Internal Medicine

## 2011-10-20 ENCOUNTER — Ambulatory Visit (INDEPENDENT_AMBULATORY_CARE_PROVIDER_SITE_OTHER): Payer: Medicare Other | Admitting: Internal Medicine

## 2011-10-20 VITALS — BP 100/60 | Temp 97.8°F | Wt 153.0 lb

## 2011-10-20 DIAGNOSIS — H579 Unspecified disorder of eye and adnexa: Secondary | ICD-10-CM

## 2011-10-20 DIAGNOSIS — E119 Type 2 diabetes mellitus without complications: Secondary | ICD-10-CM

## 2011-10-20 DIAGNOSIS — I509 Heart failure, unspecified: Secondary | ICD-10-CM

## 2011-10-20 DIAGNOSIS — R339 Retention of urine, unspecified: Secondary | ICD-10-CM

## 2011-10-20 DIAGNOSIS — J9610 Chronic respiratory failure, unspecified whether with hypoxia or hypercapnia: Secondary | ICD-10-CM

## 2011-10-20 DIAGNOSIS — E1139 Type 2 diabetes mellitus with other diabetic ophthalmic complication: Secondary | ICD-10-CM

## 2011-10-20 DIAGNOSIS — H9190 Unspecified hearing loss, unspecified ear: Secondary | ICD-10-CM

## 2011-10-20 DIAGNOSIS — R39198 Other difficulties with micturition: Secondary | ICD-10-CM

## 2011-10-20 DIAGNOSIS — I5032 Chronic diastolic (congestive) heart failure: Secondary | ICD-10-CM

## 2011-10-20 DIAGNOSIS — E232 Diabetes insipidus: Secondary | ICD-10-CM

## 2011-10-20 DIAGNOSIS — R3989 Other symptoms and signs involving the genitourinary system: Secondary | ICD-10-CM

## 2011-10-20 DIAGNOSIS — I4891 Unspecified atrial fibrillation: Secondary | ICD-10-CM

## 2011-10-20 DIAGNOSIS — E1169 Type 2 diabetes mellitus with other specified complication: Secondary | ICD-10-CM

## 2011-10-20 DIAGNOSIS — H4720 Unspecified optic atrophy: Secondary | ICD-10-CM

## 2011-10-20 DIAGNOSIS — I10 Essential (primary) hypertension: Secondary | ICD-10-CM

## 2011-10-20 LAB — HEMOGLOBIN A1C: Hgb A1c MFr Bld: 7.8 % — ABNORMAL HIGH (ref 4.6–6.5)

## 2011-10-20 LAB — POCT URINALYSIS DIPSTICK
Glucose, UA: NEGATIVE
Ketones, UA: NEGATIVE
Spec Grav, UA: 1.01
Urobilinogen, UA: 0.2

## 2011-10-20 LAB — COMPREHENSIVE METABOLIC PANEL
ALT: 16 U/L (ref 0–53)
CO2: 33 mEq/L — ABNORMAL HIGH (ref 19–32)
Calcium: 8.9 mg/dL (ref 8.4–10.5)
Chloride: 93 mEq/L — ABNORMAL LOW (ref 96–112)
GFR: 25.61 mL/min — ABNORMAL LOW (ref >60.00)
Glucose, Bld: 216 mg/dL — ABNORMAL HIGH (ref 70–99)
Sodium: 136 mEq/L (ref 135–145)
Total Protein: 7.9 g/dL (ref 6.0–8.3)

## 2011-10-20 LAB — CBC WITH DIFFERENTIAL/PLATELET
Basophils Absolute: 0 10*3/uL (ref 0.0–0.1)
Eosinophils Relative: 1.9 % (ref 0.0–5.0)
Lymphocytes Relative: 8.9 % — ABNORMAL LOW (ref 12.0–46.0)
Monocytes Relative: 11 % (ref 3.0–12.0)
Neutrophils Relative %: 77.6 % — ABNORMAL HIGH (ref 43.0–77.0)
Platelets: 207 10*3/uL (ref 150.0–400.0)
RDW: 20.6 % — ABNORMAL HIGH (ref 11.5–14.6)
WBC: 7.7 10*3/uL (ref 4.5–10.5)

## 2011-10-20 LAB — GLUCOSE, POCT (MANUAL RESULT ENTRY): POC Glucose: 213 mg/dl — AB (ref 70–99)

## 2011-10-20 MED ORDER — TEMAZEPAM 30 MG PO CAPS: 30.0000 mg | ORAL_CAPSULE | Freq: Every evening | ORAL | Status: DC | PRN

## 2011-10-20 MED ORDER — TRAMADOL HCL 50 MG PO TABS: 50.0000 mg | ORAL_TABLET | Freq: Four times a day (QID) | ORAL | Status: DC | PRN

## 2011-10-20 NOTE — Progress Notes (Signed)
Subjective:    Patient ID: [name], male    DOB: 12-26-1945, 66 y.o.   MRN: [id number]  HPI  66 year old patient who has a history of COPD and chronic hypoxic respiratory failure. He also has a history of ischemic cardiomyopathy atrial fibrillation status post pacemaker insertion.  Medical therapy includes diuretic therapy as well as Coumadin anticoagulation. He has diabetes mellitus controlled with a meal time and basal insulin  His chief complaint today is urinary frequency and some dysuria he describes voiding small frequent amounts with decrease in force of urinary stream. He denies any incontinence  Past Medical History  Diagnosis Date  . ALCOHOL ABUSE, HX OF 04/30/2008  . 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 Atrial flutter 09/20/2006  . 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 Chronic systolic heart failure 09/20/2006    previous EF 25%;  Echocardiogram 12/10/10: Mild-moderate LVH, EF 50-55%, mild MR, moderate LAE, moderate RAE, mild RVE with moderately reduced RV SF, PASP 50, small pericardial effusion  . COPD 12/17/2006  . CORONARY ARTERY DISEASE 09/20/2006    s/p stent to OM;  catheterization 12/06: Mid to distal LAD 30%, D2 20%, proximal circumflex 30%, OM 2 stents patent, ostial RCA 50%, mid RCA 40%, distal RCA multiple 40%  . 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 DYSPEPSIA&OTHER [facility] DISORDERS FUNCTION STOMACH 04/04/2009  . HYPERTENSION 12/17/2006  . OBESITY 04/30/2008  . HLD (hyperlipidemia) 09/20/2006  . OTITIS EXTERNA, CHRONIC NEC 12/28/2006  . PERSONAL HX COLONIC POLYPS 05/02/2009  . PSORIASIS 04/30/2008  . CKD (chronic kidney disease) 04/30/2008    creatinine 2.2 range    History   Social History  . Marital Status: Married    Spouse Name: N/A    Number of Children: 0  . Years of Education: N/A   Occupational History  . [employer]    Social History Main Topics  .  Smoking status: Former Smoker -- 1.0 packs/day for 50 years    Types: Cigarettes    Quit date: 03/23/2005  . Smokeless tobacco: Never Used  . Alcohol Use: No     quit drinking 2007  . Drug Use: No  . Sexually Active: Not on file   Other Topics Concern  . Not on file   Social History Narrative  . No narrative on file    Past Surgical History  Procedure Date  . Cardiac defibrillator placement   . Angioplasty   . Hernia repair     bilat  . Cardiac catheterization   . Coronary angioplasty     Stents placed  . Transthoracic echocardiogram 2005/2006/2008/2011    Family History  Problem Relation Age of Onset  . Lung cancer Mother     and aunt  . Heart attack Father   . Diabetes Sister     and aunt  . Colon cancer Neg Hx     Allergies  Allergen Reactions  . Aspirin     REACTION: Swelling, trouble breathing  . Tetracycline Hcl     REACTION: Swelling, trouble breathing    Current Outpatient Prescriptions on File Prior to Visit  Medication Sig Dispense Refill  . albuterol (PROAIR HFA) 108 (90 BASE) MCG/ACT inhaler Inhale 2 puffs into the lungs every 6 (six) hours as needed. For shortness of breath  1 Inhaler  6  . budesonide-formoterol (SYMBICORT) 160-4.5 MCG/ACT inhaler Inhale 2 puffs into the lungs 2 (two) times daily.  3 Inhaler  1  . co-enzyme Q-10 30 MG capsule Take 30 mg by mouth daily.       Marland Kitchen ezetimibe-simvastatin (VYTORIN) 10-40 MG per tablet Take 1 tablet by mouth at bedtime.  90 tablet  3  . furosemide (LASIX) 80 MG tablet Take 80 mg by mouth 2 (two) times daily.      . hydrocortisone cream 1 % Apply to affected area 2 times daily  15 g  0  . insulin glargine (LANTUS) 100 UNIT/ML injection Inject 30 Units into the skin at bedtime. If sugar is around 100, takes half the dose.      . insulin lispro (HUMALOG) 100 UNIT/ML injection 20 Units 3 (three) times daily before meals. Inject 20 units before each meal - if blood sugars more than 200 add 8 units.       .  metolazone (ZAROXOLYN) 2.5 MG tablet Take one tablet by mouth on Mon/Wed/Fri 30 min prior to Furosemide  30 tablet  3  . metoprolol (TOPROL-XL) 50 MG 24 hr tablet 1/2 tab in the morning and 1 whole tab in the evening      . potassium chloride SA (K-DUR,KLOR-CON) 20 MEQ tablet Take 20-30 mEq by mouth daily. 1.5 tablets in the AM, 1 tablet in the PM      . RABEprazole (ACIPHEX) 20 MG tablet Take 1 tablet (20 mg total) by mouth daily.  90 tablet  3  . tiotropium (SPIRIVA) 18 MCG inhalation capsule Place 18 mcg into inhaler and inhale daily.      Marland Kitchen warfarin (COUMADIN) 5 MG tablet TAKE ONE TABLET BY MOUTH DAILY  90 tablet  0  . DISCONTD: insulin glargine (LANTUS) 100 UNIT/ML injection 40 units at bedtime  10 mL  3  . DISCONTD: insulin glargine (LANTUS) 100 UNIT/ML injection 30 units at bedtime  10 mL  3  . DISCONTD: metolazone (ZAROXOLYN) 2.5 MG tablet Take 1 tablet (2.5 mg total) by mouth daily.  30 tablet  5  . DISCONTD: potassium chloride SA (K-DUR,KLOR-CON) 20 MEQ tablet Take 2 tablets (40 mEq total) by mouth daily.  90 tablet  1    BP 100/60  Temp 97.8 F (36.6 C) (Oral)  Wt 153 lb (69.4 kg)  SpO2 96%     Review of Systems  Constitutional: Negative for fever, chills, appetite change and fatigue.  HENT: Negative for hearing loss, ear pain, congestion, sore throat, trouble swallowing, neck stiffness, dental problem, voice change and tinnitus.   Eyes: Negative for pain, discharge and visual disturbance.  Respiratory: Positive for shortness of breath. Negative for cough, chest tightness, wheezing and stridor.   Cardiovascular: Positive for leg swelling. Negative for chest pain and palpitations.  Gastrointestinal: Negative for nausea, vomiting, abdominal pain, diarrhea, constipation, blood in stool and abdominal distention.  Genitourinary: Positive for dysuria, urgency, frequency and decreased urine volume. Negative for hematuria, flank pain, discharge, difficulty urinating and genital sores.    Musculoskeletal: Negative for myalgias, back pain, joint swelling, arthralgias and gait problem.  Skin: Negative for rash.  Neurological: Negative for dizziness, syncope, speech difficulty, weakness, numbness and headaches.  Hematological: Negative for adenopathy. Does not bruise/bleed easily.  Psychiatric/Behavioral: Negative for behavioral problems and dysphoric mood. The patient is not nervous/anxious.        Objective:   Physical Exam  Constitutional: He is oriented to person, place, and time. He appears well-developed.       Appears chronically ill but in no acute distress.  HENT:  Head: Normocephalic.  Right Ear: External ear normal.  Left Ear: External ear normal.  Eyes: Conjunctivae and EOM are normal.  Neck: Normal range of motion.  Cardiovascular: Normal rate, regular rhythm and normal heart sounds.   Pulmonary/Chest: Effort normal and breath sounds normal.       O2 saturation 96 on supplemental oxygen  Abdominal: Soft. Bowel sounds are normal.       Suprapubic mass effect consistent with a distended bladder  Musculoskeletal: Normal range of motion. He exhibits edema. He exhibits no tenderness.  Neurological: He is alert and oriented to person, place, and time.  Psychiatric: He has a normal mood and affect. His behavior is normal.          Assessment & Plan:   Urinary retention. We'll set up for prompt urological evaluation. The patient will need a Foley catheter placed for bladder decompression. We'll check lab to include electrolytes Diabetes mellitus Congestive heart failure COPD  Followup urology cardiology and pulmonary Check hemoglobin A1c Recheck here 1 month

## 2011-10-20 NOTE — Patient Instructions (Addendum)
Urology appointment as discussed  Limit your sodium (Salt) intake    Please check your hemoglobin A1c every 3 months

## 2011-10-21 ENCOUNTER — Telehealth: Payer: Self-pay

## 2011-10-21 NOTE — Progress Notes (Signed)
Quick Note:  Attempt to call hm and cell# - [facility] and make rov in 3wks with dr. Amador Cunas ______

## 2011-10-21 NOTE — Telephone Encounter (Signed)
Attempt tocall hm and cell # - VM at both - [facility] and make f/u appt with dr. Amador Cunas in 3wks - (see labs)

## 2011-10-23 ENCOUNTER — Other Ambulatory Visit: Payer: Self-pay | Admitting: Internal Medicine

## 2011-10-26 NOTE — Telephone Encounter (Signed)
Was given printed rx at 10/20/11 office visit

## 2011-10-30 ENCOUNTER — Telehealth: Payer: Self-pay | Admitting: Internal Medicine

## 2011-10-30 NOTE — Telephone Encounter (Signed)
Caller: Linda/Spouse; Patient Name: [name]; PCP: [name]; Best Callback Phone Number: [phone] Patient's spouse/Linda states patient has blood in urine.  Patient has a foley cath.at this time.  Emergent symptoms Unbearable flank, low back or lower abdominal pain per Bloody urine protocol, referred to ED.  Patient and patient's spouse would like a referral to Urologist. Please contact patient and/or wife with referral.   Health history, medications, and allergies reviewed: yes Reason for call with symptoms and associated symptoms: Bloody urine Date of onset of symptoms: 10/27/11 Any treatments/meds tried for symptoms and did they work, (please document med, dosage and time):  Guideline viewed: Bloody Urine Disposition: See in ED Immediately Advice given: ED Immediately

## 2011-11-02 ENCOUNTER — Encounter (HOSPITAL_COMMUNITY): Payer: Medicare Other

## 2011-11-08 ENCOUNTER — Other Ambulatory Visit: Payer: Self-pay | Admitting: Internal Medicine

## 2011-11-09 ENCOUNTER — Encounter: Payer: Self-pay | Admitting: Internal Medicine

## 2011-11-09 ENCOUNTER — Ambulatory Visit (INDEPENDENT_AMBULATORY_CARE_PROVIDER_SITE_OTHER): Payer: Medicare Other | Admitting: Internal Medicine

## 2011-11-09 VITALS — BP 120/80 | Temp 97.8°F | Wt 163.0 lb

## 2011-11-09 DIAGNOSIS — I5042 Chronic combined systolic (congestive) and diastolic (congestive) heart failure: Secondary | ICD-10-CM

## 2011-11-09 DIAGNOSIS — I509 Heart failure, unspecified: Secondary | ICD-10-CM

## 2011-11-09 DIAGNOSIS — R339 Retention of urine, unspecified: Secondary | ICD-10-CM

## 2011-11-09 DIAGNOSIS — I10 Essential (primary) hypertension: Secondary | ICD-10-CM

## 2011-11-09 DIAGNOSIS — J4489 Other specified chronic obstructive pulmonary disease: Secondary | ICD-10-CM

## 2011-11-09 DIAGNOSIS — I4891 Unspecified atrial fibrillation: Secondary | ICD-10-CM

## 2011-11-09 DIAGNOSIS — J449 Chronic obstructive pulmonary disease, unspecified: Secondary | ICD-10-CM

## 2011-11-09 MED ORDER — TRAMADOL HCL 50 MG PO TABS: 50.0000 mg | ORAL_TABLET | Freq: Four times a day (QID) | ORAL | Status: AC | PRN

## 2011-11-09 NOTE — Patient Instructions (Signed)
Limit your sodium (Salt) intake   Please check your hemoglobin A1c every 3 months   

## 2011-11-09 NOTE — Progress Notes (Signed)
  Subjective:    Patient ID: [name], male    DOB: September 04, 1945, 66 y.o.   MRN: [id number]  HPI  66 year old patient who is seen today for followup. He was seen recently with acute urinary retention and was referred to urology. He continues to have a Foley catheter and is scheduled for urological followup.Marland Kitchen He feels much improved. He has self down titrated insulin therapy. He pravastatin only about 10 units of Lantus at bedtime instead of his usual 30 units. Likewise he is using mealtime insulin 10 units prior to breakfast only. His prescribed regimen is 20 units prior to meals with an additional 8 units if blood sugar is in excess of 200. He states that blood sugars are generally in the 200 range. He has been fearful for hypoglycemia  Wt Readings from Last 3 Encounters:  11/09/11 163 lb (73.936 kg)  10/20/11 153 lb (69.4 kg)  10/09/11 168 lb 9.6 oz (76.476 kg)    Review of Systems  Constitutional: Negative for fever, chills, appetite change and fatigue.  HENT: Negative for hearing loss, ear pain, congestion, sore throat, trouble swallowing, neck stiffness, dental problem, voice change and tinnitus.   Eyes: Negative for pain, discharge and visual disturbance.  Respiratory: Positive for shortness of breath. Negative for cough, chest tightness, wheezing and stridor.   Cardiovascular: Positive for leg swelling. Negative for chest pain and palpitations.  Gastrointestinal: Negative for nausea, vomiting, abdominal pain, diarrhea, constipation, blood in stool and abdominal distention.  Genitourinary: Negative for urgency, hematuria, flank pain, discharge, difficulty urinating and genital sores.  Musculoskeletal: Negative for myalgias, back pain, joint swelling, arthralgias and gait problem.  Skin: Negative for rash.  Neurological: Negative for dizziness, syncope, speech difficulty, weakness, numbness and headaches.  Hematological: Negative for adenopathy. Does not bruise/bleed easily.    Psychiatric/Behavioral: Negative for behavioral problems and dysphoric mood. The patient is not nervous/anxious.        Objective:   Physical Exam  Constitutional: He is oriented to person, place, and time. He appears well-developed.       Blood pressure 120/80  HENT:  Head: Normocephalic.  Right Ear: External ear normal.  Left Ear: External ear normal.  Eyes: Conjunctivae and EOM are normal.  Neck: Normal range of motion.  Cardiovascular: Normal rate and normal heart sounds.   Pulmonary/Chest: Breath sounds normal.       O2 saturation 95%  Abdominal: Bowel sounds are normal.  Musculoskeletal: Normal range of motion. He exhibits edema. He exhibits no tenderness.       Considerable lower extremity edema right greater than the left  Neurological: He is alert and oriented to person, place, and time.  Psychiatric: He has a normal mood and affect. His behavior is normal.          Assessment & Plan:   Congestive heart failure COPD Diabetes mellitus. Compliance stressed we'll up titrate insulin therapy Recheck 1 month we'll check renal indices and electrolytes at that time Followup urology

## 2011-11-15 ENCOUNTER — Emergency Department (HOSPITAL_COMMUNITY)
Admission: EM | Admit: 2011-11-15 | Discharge: 2011-11-15 | Disposition: A | Discharge status: Home / Self Care | Payer: Medicare Other | Attending: Emergency Medicine | Admitting: Emergency Medicine

## 2011-11-15 ENCOUNTER — Encounter (HOSPITAL_COMMUNITY): Payer: Self-pay | Admitting: Emergency Medicine

## 2011-11-15 ENCOUNTER — Emergency Department (HOSPITAL_COMMUNITY): Payer: Medicare Other

## 2011-11-15 DIAGNOSIS — E119 Type 2 diabetes mellitus without complications: Secondary | ICD-10-CM | POA: Insufficient documentation

## 2011-11-15 DIAGNOSIS — Z794 Long term (current) use of insulin: Secondary | ICD-10-CM | POA: Insufficient documentation

## 2011-11-15 DIAGNOSIS — J4489 Other specified chronic obstructive pulmonary disease: Secondary | ICD-10-CM | POA: Insufficient documentation

## 2011-11-15 DIAGNOSIS — I4891 Unspecified atrial fibrillation: Secondary | ICD-10-CM | POA: Insufficient documentation

## 2011-11-15 DIAGNOSIS — Z87891 Personal history of nicotine dependence: Secondary | ICD-10-CM | POA: Insufficient documentation

## 2011-11-15 DIAGNOSIS — Z79899 Other long term (current) drug therapy: Secondary | ICD-10-CM | POA: Insufficient documentation

## 2011-11-15 DIAGNOSIS — Z7901 Long term (current) use of anticoagulants: Secondary | ICD-10-CM | POA: Insufficient documentation

## 2011-11-15 DIAGNOSIS — N390 Urinary tract infection, site not specified: Secondary | ICD-10-CM | POA: Insufficient documentation

## 2011-11-15 DIAGNOSIS — N189 Chronic kidney disease, unspecified: Secondary | ICD-10-CM | POA: Insufficient documentation

## 2011-11-15 DIAGNOSIS — I129 Hypertensive chronic kidney disease with stage 1 through stage 4 chronic kidney disease, or unspecified chronic kidney disease: Secondary | ICD-10-CM | POA: Insufficient documentation

## 2011-11-15 DIAGNOSIS — J449 Chronic obstructive pulmonary disease, unspecified: Secondary | ICD-10-CM | POA: Insufficient documentation

## 2011-11-15 LAB — COMPREHENSIVE METABOLIC PANEL
Albumin: 3.5 g/dL (ref 3.5–5.2)
Alkaline Phosphatase: 115 U/L (ref 39–117)
BUN: 61 mg/dL — ABNORMAL HIGH (ref 6–23)
CO2: 31 mEq/L (ref 19–32)
Chloride: 92 mEq/L — ABNORMAL LOW (ref 96–112)
GFR calc non Af Amer: 41 mL/min — ABNORMAL LOW (ref >90)
Glucose, Bld: 176 mg/dL — ABNORMAL HIGH (ref 70–99)
Potassium: 3.8 mEq/L (ref 3.5–5.1)
Total Bilirubin: 0.8 mg/dL (ref 0.3–1.2)

## 2011-11-15 LAB — CBC WITH DIFFERENTIAL/PLATELET
Basophils Relative: 1 % (ref 0–1)
HCT: 43.8 % (ref 39.0–52.0)
Hemoglobin: 13.8 g/dL (ref 13.0–17.0)
Lymphocytes Relative: 8 % — ABNORMAL LOW (ref 12–46)
Lymphs Abs: 0.8 10*3/uL (ref 0.7–4.0)
Monocytes Absolute: 0.7 10*3/uL (ref 0.1–1.0)
Monocytes Relative: 7 % (ref 3–12)
Neutro Abs: 8.3 10*3/uL — ABNORMAL HIGH (ref 1.7–7.7)
Neutrophils Relative %: 82 % — ABNORMAL HIGH (ref 43–77)
RBC: 5.67 MIL/uL (ref 4.22–5.81)
WBC: 10.1 10*3/uL (ref 4.0–10.5)

## 2011-11-15 LAB — URINALYSIS, ROUTINE W REFLEX MICROSCOPIC
Glucose, UA: NEGATIVE mg/dL
Nitrite: NEGATIVE
Specific Gravity, Urine: 1.011 (ref 1.005–1.030)
pH: 6 (ref 5.0–8.0)

## 2011-11-15 LAB — PROTIME-INR: Prothrombin Time: 20.8 seconds — ABNORMAL HIGH (ref 11.6–15.2)

## 2011-11-15 LAB — URINE MICROSCOPIC-ADD ON

## 2011-11-15 MED ORDER — CEFUROXIME AXETIL 250 MG PO TABS: 250.0000 mg | ORAL_TABLET | Freq: Two times a day (BID) | ORAL | Status: AC

## 2011-11-15 MED ORDER — SODIUM CHLORIDE 0.9 % IV SOLN
Freq: Once | INTRAVENOUS | Status: AC
  Administered 2011-11-15: 12:00:00 via INTRAVENOUS

## 2011-11-15 NOTE — ED Notes (Signed)
RN to obtain labs with start of IV 

## 2011-11-15 NOTE — ED Notes (Signed)
Patient states hematuria for about a week-states he has been "peeing pure blood"

## 2011-11-15 NOTE — ED Notes (Signed)
Weakness for 2 wks, shortness of breath for 1 one. Pt feels bad and extremely weak, too weak to get up off sofa.

## 2011-11-15 NOTE — ED Provider Notes (Signed)
History     CSN: [id number]  Arrival date & time 11/15/11  1056   First MD Initiated Contact with Patient 11/15/11 1243      Chief Complaint  Patient presents with  . Shortness of Breath  . Hematuria    (Consider location/radiation/quality/duration/timing/severity/associated sxs/prior treatment) Patient is a 66 y.o. male presenting with hematuria. The history is provided by the patient.  Hematuria  he has hx of oxygen dependent copd.  He also has had recurrent urinary retention. Now he has a foley in.  He c/o generalized weakness and blood in urine.   He denies pain anywhere now.   He denies n/v/f/c/cough. His sob is baseline.   He no longer smokes.  He is on coumadin.    Past Medical History  Diagnosis Date  . ALCOHOL ABUSE, HX OF 04/30/2008  . 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 Atrial flutter 09/20/2006  . 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 Chronic systolic heart failure 09/20/2006    previous EF 25%;  Echocardiogram 12/10/10: Mild-moderate LVH, EF 50-55%, mild MR, moderate LAE, moderate RAE, mild RVE with moderately reduced RV SF, PASP 50, small pericardial effusion  . COPD 12/17/2006  . CORONARY ARTERY DISEASE 09/20/2006    s/p stent to OM;  catheterization 12/06: Mid to distal LAD 30%, D2 20%, proximal circumflex 30%, OM 2 stents patent, ostial RCA 50%, mid RCA 40%, distal RCA multiple 40%  . 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 DYSPEPSIA&OTHER [facility] DISORDERS FUNCTION STOMACH 04/04/2009  . HYPERTENSION 12/17/2006  . OBESITY 04/30/2008  . HLD (hyperlipidemia) 09/20/2006  . OTITIS EXTERNA, CHRONIC NEC 12/28/2006  . PERSONAL HX COLONIC POLYPS 05/02/2009  . PSORIASIS 04/30/2008  . CKD (chronic kidney disease) 04/30/2008    creatinine 2.2 range    Past Surgical History  Procedure Date  . Cardiac defibrillator placement   . Angioplasty   . Hernia repair     bilat  . Cardiac  catheterization   . Coronary angioplasty     Stents placed  . Transthoracic echocardiogram 2005/2006/2008/2011    Family History  Problem Relation Age of Onset  . Lung cancer Mother     and aunt  . Heart attack Father   . Diabetes Sister     and aunt  . Colon cancer Neg Hx     History  Substance Use Topics  . Smoking status: Former Smoker -- 1.0 packs/day for 50 years    Types: Cigarettes    Quit date: 03/23/2005  . Smokeless tobacco: Never Used  . Alcohol Use: No     quit drinking 2007      Review of Systems  Respiratory: Positive for shortness of breath.   Genitourinary: Positive for hematuria.    Allergies  Aspirin and Tetracycline hcl  Home Medications   Current Outpatient Rx  Name Route Sig Dispense Refill  . ALBUTEROL SULFATE HFA 108 (90 BASE) MCG/ACT IN AERS Inhalation Inhale 2 puffs into the lungs every 6 (six) hours as needed. For shortness of breath 1 Inhaler 6  . BUDESONIDE-FORMOTEROL FUMARATE 160-4.5 MCG/ACT IN AERO Inhalation Inhale 2 puffs into the lungs 2 (two) times daily. 3 Inhaler 1  . COENZYME Q10 30 MG PO CAPS Oral Take 30 mg by mouth daily.     Marland Kitchen EZETIMIBE-SIMVASTATIN 10-40 MG PO TABS Oral Take 1 tablet by mouth at bedtime. 90 tablet 3  . FUROSEMIDE 80 MG PO  TABS Oral Take 80 mg by mouth 2 (two) times daily.    Marland Kitchen HYDROCORTISONE 1 % EX CREA  Apply to affected area 2 times daily 15 g 0  . INSULIN GLARGINE 100 UNIT/ML [place] SOLN Subcutaneous Inject 30 Units into the skin at bedtime. If sugar is around 100, takes half the dose.    . INSULIN LISPRO (HUMAN) 100 UNIT/ML [place] SOLN  20 Units 3 (three) times daily before meals. Inject 20 units before each meal - if blood sugars more than 200 add 8 units.     Marland Kitchen METOLAZONE 2.5 MG PO TABS  Take one tablet by mouth on Mon/Wed/Fri 30 min prior to Furosemide 30 tablet 3  . METOPROLOL SUCCINATE ER 50 MG PO TB24  1/2 tab in the morning and 1 whole tab in the evening    . POTASSIUM CHLORIDE CRYS ER 20 MEQ PO TBCR Oral  Take 20-30 mEq by mouth daily. 1.5 tablets in the AM, 1 tablet in the PM    . RABEPRAZOLE SODIUM 20 MG PO TBEC Oral Take 1 tablet (20 mg total) by mouth daily. 90 tablet 3  . TEMAZEPAM 30 MG PO CAPS Oral Take 1 capsule (30 mg total) by mouth at bedtime as needed for sleep. 90 capsule 0  . TIOTROPIUM BROMIDE MONOHYDRATE 18 MCG IN CAPS Inhalation Place 18 mcg into inhaler and inhale daily.    . TRAMADOL HCL 50 MG PO TABS Oral Take 1 tablet (50 mg total) by mouth every 6 (six) hours as needed for pain. 180 tablet 4  . WARFARIN SODIUM 5 MG PO TABS Oral Take 2.5 mg by mouth daily. Pt takes 1/2 tab for 2.5mg  dose    . CEFUROXIME AXETIL 250 MG PO TABS Oral Take 1 tablet (250 mg total) by mouth 2 (two) times daily. 20 tablet 0    BP 136/82  Pulse 80  Temp 98.5 F (36.9 C) (Oral)  Resp 23  SpO2 99%  Physical Exam  Nursing note and vitals reviewed. Constitutional: He is oriented to person, place, and time. He appears well-developed and well-nourished. No distress.  HENT:  Head: Normocephalic and atraumatic.  Eyes: Conjunctivae and EOM are normal.  Neck: Normal range of motion. Neck supple.  Cardiovascular: Normal rate and regular rhythm.   No murmur heard. Pulmonary/Chest: Effort normal and breath sounds normal. No respiratory distress. He has no rales.  Abdominal: Soft. Bowel sounds are normal.  Musculoskeletal: Normal range of motion.  Neurological: He is alert and oriented to person, place, and time.  Skin: Skin is warm and dry.  Psychiatric: He has a normal mood and affect. Thought content normal.    ED Course  Procedures (including critical care time) 58 y male with copd c/o weakness and hematuria.  Has indwelling foley and is on coumadin.   Will check labs for eval.    Labs Reviewed  URINALYSIS, ROUTINE W REFLEX MICROSCOPIC - Abnormal; Notable for the following:    Color, Urine RED (*)  BIOCHEMICALS MAY BE AFFECTED BY COLOR   APPearance TURBID (*)     Hgb urine dipstick LARGE (*)      Protein, ur 100 (*)     Leukocytes, UA LARGE (*)     All other components within normal limits  CBC WITH DIFFERENTIAL - Abnormal; Notable for the following:    MCV 77.2 (*)     MCH 24.3 (*)     RDW 18.4 (*)     Neutrophils Relative 82 (*)  Neutro Abs 8.3 (*)     Lymphocytes Relative 8 (*)     All other components within normal limits  COMPREHENSIVE METABOLIC PANEL - Abnormal; Notable for the following:    Chloride 92 (*)     Glucose, Bld 176 (*)     BUN 61 (*)     Creatinine, Ser 1.68 (*)     Total Protein 9.4 (*)     GFR calc non Af Amer 41 (*)     GFR calc Af Amer 48 (*)     All other components within normal limits  PROTIME-INR - Abnormal; Notable for the following:    Prothrombin Time 20.8 (*)     INR 1.76 (*)     All other components within normal limits  URINE MICROSCOPIC-ADD ON - Abnormal; Notable for the following:    Bacteria, UA MANY (*)     All other components within normal limits  URINE CULTURE   Dg Chest 2 View  11/15/2011  *RADIOLOGY REPORT*  Clinical Data: Shortness of breath, fluid retention  CHEST - 2 VIEW  Comparison: 08/07/2011  Findings: Cardiomegaly.  Increased interstitial markings without frank interstitial edema. Mild patchy right lower lobe opacity, unchanged, likely atelectasis.  Small right pleural effusion, increased.  Left pleural effusion has resolved.  No pneumothorax.  Left subclavian pacemaker.  IMPRESSION: Cardiomegaly with small right pleural effusion.  Increased interstitial markings without frank interstitial edema.  Stable right lower lobe opacity, likely atelectasis.   Original Report Authenticated By: Charline Bills, M.D.      1. UTI (lower urinary tract infection)       MDM  UTI Not toxic. Not over anticoagulated. No distress. Has oxygen at home.          Cheri Guppy, MD 11/15/11 [phone]

## 2011-11-17 LAB — URINE CULTURE: Colony Count: >=100000

## 2011-11-18 NOTE — ED Notes (Signed)
+   Urine Patient treated with Ceftin-sensitive to same-chart appended per protocol MD. 

## 2011-11-21 ENCOUNTER — Other Ambulatory Visit: Payer: Self-pay | Admitting: Internal Medicine

## 2011-12-07 ENCOUNTER — Ambulatory Visit: Payer: Medicare Other | Admitting: Internal Medicine

## 2011-12-08 ENCOUNTER — Encounter: Payer: Self-pay | Admitting: Internal Medicine

## 2011-12-08 ENCOUNTER — Ambulatory Visit (INDEPENDENT_AMBULATORY_CARE_PROVIDER_SITE_OTHER): Payer: Medicare Other | Admitting: Internal Medicine

## 2011-12-08 VITALS — BP 104/76 | Temp 97.7°F

## 2011-12-08 DIAGNOSIS — I10 Essential (primary) hypertension: Secondary | ICD-10-CM

## 2011-12-08 DIAGNOSIS — J449 Chronic obstructive pulmonary disease, unspecified: Secondary | ICD-10-CM

## 2011-12-08 DIAGNOSIS — E119 Type 2 diabetes mellitus without complications: Secondary | ICD-10-CM

## 2011-12-08 MED ORDER — SIMVASTATIN 40 MG PO TABS: 40.0000 mg | ORAL_TABLET | Freq: Every day | ORAL | Status: AC

## 2011-12-08 NOTE — Progress Notes (Signed)
  Subjective:    Patient ID: [name], male    DOB: 1945/09/01, 66 y.o.   MRN: [id number]  HPI  66 year old patient who is seen today for followup of his type 2 diabetes. At the present time he states that he is taking Lantus 20 units at bedtime and 20 units prior to each meal 2 or 3 times daily. He admits to occasional noncompliance with his mealtime dosing. He states fasting blood sugars are doing the 120-160 range and rarely less than 100. His hemoglobin A1c's have been trending down. He also states that there is occasional daytime hypoglycemia. His cardiopulmonary state fairly stable. He is being followed by urology closely do to urinary retention. He is felt to have B00 secondary to BPH and was placed on 2 medications today prior to a trial of discontinuation of his Foley catheter in the near future. There has been a weight gain since his last visit here  Wt Readings from Last 3 Encounters:  11/09/11 163 lb (73.936 kg)  10/20/11 153 lb (69.4 kg)  10/09/11 168 lb 9.6 oz (76.476 kg)    Review of Systems  Constitutional: Positive for unexpected weight change. Negative for fever, chills, appetite change and fatigue.  HENT: Negative for hearing loss, ear pain, congestion, sore throat, trouble swallowing, neck stiffness, dental problem, voice change and tinnitus.   Eyes: Negative for pain, discharge and visual disturbance.  Respiratory: Negative for cough, chest tightness, wheezing and stridor.   Cardiovascular: Positive for leg swelling. Negative for chest pain and palpitations.  Gastrointestinal: Negative for nausea, vomiting, abdominal pain, diarrhea, constipation, blood in stool and abdominal distention.  Genitourinary: Negative for urgency, hematuria, flank pain, discharge, difficulty urinating and genital sores.  Musculoskeletal: Negative for myalgias, back pain, joint swelling, arthralgias and gait problem.  Skin: Negative for rash.  Neurological: Negative for dizziness, syncope,  speech difficulty, weakness, numbness and headaches.  Hematological: Negative for adenopathy. Does not bruise/bleed easily.  Psychiatric/Behavioral: Negative for behavioral problems and dysphoric mood. The patient is not nervous/anxious.        Objective:   Physical Exam  Constitutional: He is oriented to person, place, and time. He appears well-developed and well-nourished. No distress.  HENT:  Head: Normocephalic.  Right Ear: External ear normal.  Left Ear: External ear normal.  Eyes: Conjunctivae normal and EOM are normal.  Neck: Normal range of motion.  Cardiovascular: Normal rate and normal heart sounds.   Pulmonary/Chest: Effort normal. He has rales.       Few basilar rales  Abdominal: Bowel sounds are normal.  Musculoskeletal: Normal range of motion. He exhibits edema. He exhibits no tenderness.       Right greater than left peripheral edema  Neurological: He is alert and oriented to person, place, and time.  Psychiatric: He has a normal mood and affect. His behavior is normal.          Assessment & Plan:   Diabetes mellitus. Will continue present regimen implies with his medications encouraged. We'll recheck in 2 months and check a hemoglobin A1c at that time Hypertension stable Dyslipidemia COPD

## 2011-12-08 NOTE — Patient Instructions (Signed)
Limit your sodium (Salt) intake   Please check your hemoglobin A1c every 3 months   

## 2012-01-04 ENCOUNTER — Other Ambulatory Visit: Payer: Self-pay | Admitting: Internal Medicine

## 2012-01-08 ENCOUNTER — Encounter: Payer: Self-pay | Admitting: Internal Medicine

## 2012-01-08 ENCOUNTER — Ambulatory Visit (INDEPENDENT_AMBULATORY_CARE_PROVIDER_SITE_OTHER): Payer: Medicare Other | Admitting: Internal Medicine

## 2012-01-08 VITALS — BP 120/80 | HR 81 | Temp 98.7°F | Wt 150.0 lb

## 2012-01-08 DIAGNOSIS — J4489 Other specified chronic obstructive pulmonary disease: Secondary | ICD-10-CM

## 2012-01-08 DIAGNOSIS — Z23 Encounter for immunization: Secondary | ICD-10-CM

## 2012-01-08 DIAGNOSIS — I509 Heart failure, unspecified: Secondary | ICD-10-CM

## 2012-01-08 DIAGNOSIS — N259 Disorder resulting from impaired renal tubular function, unspecified: Secondary | ICD-10-CM

## 2012-01-08 DIAGNOSIS — E119 Type 2 diabetes mellitus without complications: Secondary | ICD-10-CM

## 2012-01-08 DIAGNOSIS — I2510 Atherosclerotic heart disease of native coronary artery without angina pectoris: Secondary | ICD-10-CM

## 2012-01-08 DIAGNOSIS — J449 Chronic obstructive pulmonary disease, unspecified: Secondary | ICD-10-CM

## 2012-01-08 DIAGNOSIS — I5042 Chronic combined systolic (congestive) and diastolic (congestive) heart failure: Secondary | ICD-10-CM

## 2012-01-08 DIAGNOSIS — E876 Hypokalemia: Secondary | ICD-10-CM

## 2012-01-08 LAB — BASIC METABOLIC PANEL
BUN: 49 mg/dL — ABNORMAL HIGH (ref 6–23)
CO2: 32 mEq/L (ref 19–32)
Chloride: 94 mEq/L — ABNORMAL LOW (ref 96–112)
Creatinine, Ser: 1.8 mg/dL — ABNORMAL HIGH (ref 0.4–1.5)

## 2012-01-08 MED ORDER — GABAPENTIN 100 MG PO CAPS: 100.0000 mg | ORAL_CAPSULE | Freq: Three times a day (TID) | ORAL | Status: AC

## 2012-01-08 MED ORDER — INSULIN GLARGINE 100 UNIT/ML ~~LOC~~ SOLN: SUBCUTANEOUS | Status: AC

## 2012-01-08 NOTE — Progress Notes (Signed)
Subjective:    Patient ID: [name], male    DOB: 03/17/1946, 66 y.o.   MRN: [id number]  HPI  66 year old patient who is seen today for followup. His chief complaint is worsening bilateral feet pain. Pain is worse with walking but also occurs at rest he does have a history of a diabetic peripheral neuropathy. He remains on Coumadin anticoagulation. He has advanced pulmonary disease and chronic systolic heart failure. Last hemoglobin A1c greater than 7. He remains on mealtime and basal insulin  Past Medical History  Diagnosis Date  . ALCOHOL ABUSE, HX OF 04/30/2008  . 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 Atrial flutter 09/20/2006  . 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 Chronic systolic heart failure 09/20/2006    previous EF 25%;  Echocardiogram 12/10/10: Mild-moderate LVH, EF 50-55%, mild MR, moderate LAE, moderate RAE, mild RVE with moderately reduced RV SF, PASP 50, small pericardial effusion  . COPD 12/17/2006  . CORONARY ARTERY DISEASE 09/20/2006    s/p stent to OM;  catheterization 12/06: Mid to distal LAD 30%, D2 20%, proximal circumflex 30%, OM 2 stents patent, ostial RCA 50%, mid RCA 40%, distal RCA multiple 40%  . 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 DYSPEPSIA&OTHER [facility] DISORDERS FUNCTION STOMACH 04/04/2009  . HYPERTENSION 12/17/2006  . OBESITY 04/30/2008  . HLD (hyperlipidemia) 09/20/2006  . OTITIS EXTERNA, CHRONIC NEC 12/28/2006  . PERSONAL HX COLONIC POLYPS 05/02/2009  . PSORIASIS 04/30/2008  . CKD (chronic kidney disease) 04/30/2008    creatinine 2.2 range    History   Social History  . Marital Status: Married    Spouse Name: N/A    Number of Children: 0  . Years of Education: N/A   Occupational History  . [employer]    Social History Main Topics  . Smoking status: Former Smoker -- 1.0 packs/day for 50 years    Types: Cigarettes    Quit date: 03/23/2005  . Smokeless  tobacco: Never Used  . Alcohol Use: No     quit drinking 2007  . Drug Use: No  . Sexually Active: Not on file   Other Topics Concern  . Not on file   Social History Narrative  . No narrative on file    Past Surgical History  Procedure Date  . Cardiac defibrillator placement   . Angioplasty   . Hernia repair     bilat  . Cardiac catheterization   . Coronary angioplasty     Stents placed  . Transthoracic echocardiogram 2005/2006/2008/2011    Family History  Problem Relation Age of Onset  . Lung cancer Mother     and aunt  . Heart attack Father   . Diabetes Sister     and aunt  . Colon cancer Neg Hx     Allergies  Allergen Reactions  . Aspirin     REACTION: Swelling, trouble breathing  . Tetracycline Hcl     REACTION: Swelling, trouble breathing    Current Outpatient Prescriptions on File Prior to Visit  Medication Sig Dispense Refill  . albuterol (PROAIR HFA) 108 (90 BASE) MCG/ACT inhaler Inhale 2 puffs into the lungs every 6 (six) hours as needed. For shortness of breath  1 Inhaler  6  . budesonide-formoterol (SYMBICORT) 160-4.5 MCG/ACT inhaler Inhale 2 puffs into the lungs 2 (two) times daily.  3 Inhaler  1  . co-enzyme Q-10 30 MG capsule Take 30 mg  by mouth daily.       . furosemide (LASIX) 80 MG tablet Take 80 mg by mouth 2 (two) times daily.      . hydrocortisone cream 1 % Apply to affected area 2 times daily  15 g  0  . insulin glargine (LANTUS) 100 UNIT/ML injection Inject 30 Units into the skin at bedtime. If sugar is around 100, takes half the dose.      . insulin lispro (HUMALOG) 100 UNIT/ML injection 20 Units 3 (three) times daily before meals. Inject 20 units before each meal - if blood sugars more than 200 add 8 units.       . metolazone (ZAROXOLYN) 2.5 MG tablet Take one tablet by mouth on Mon/Wed/Fri 30 min prior to Furosemide  30 tablet  3  . metoprolol (TOPROL-XL) 50 MG 24 hr tablet 1/2 tab in the morning and 1 whole tab in the evening      .  potassium chloride SA (K-DUR,KLOR-CON) 20 MEQ tablet Take 20-30 mEq by mouth daily. 1.5 tablets in the AM, 1 tablet in the PM      . potassium chloride SA (K-DUR,KLOR-CON) 20 MEQ tablet TAKE 2 TABLETS BY MOUTH DAILY  180 tablet  1  . RABEprazole (ACIPHEX) 20 MG tablet Take 1 tablet (20 mg total) by mouth daily.  90 tablet  3  . simvastatin (ZOCOR) 40 MG tablet Take 1 tablet (40 mg total) by mouth at bedtime.  90 tablet  3  . temazepam (RESTORIL) 30 MG capsule TAKE ONE CAPSULE BY MOUTH AT BEDTIME  90 capsule  0  . tiotropium (SPIRIVA) 18 MCG inhalation capsule Place 18 mcg into inhaler and inhale daily.      . traMADol (ULTRAM) 50 MG tablet Take 1 tablet (50 mg total) by mouth every 6 (six) hours as needed for pain.  180 tablet  4  . warfarin (COUMADIN) 5 MG tablet Take 2.5 mg by mouth daily. Pt takes 1/2 tab for 2.5mg  dose      . DISCONTD: insulin glargine (LANTUS) 100 UNIT/ML injection 30 units at bedtime  10 mL  3  . DISCONTD: metolazone (ZAROXOLYN) 2.5 MG tablet Take 1 tablet (2.5 mg total) by mouth daily.  30 tablet  5  . DISCONTD: potassium chloride SA (K-DUR,KLOR-CON) 20 MEQ tablet Take 2 tablets (40 mEq total) by mouth daily.  90 tablet  1    BP 120/80  Pulse 81  Temp 98.7 F (37.1 C) (Oral)  Wt 150 lb (68.04 kg)  SpO2 95%      Review of Systems  Constitutional: Negative for fever, chills, appetite change and fatigue.  HENT: Negative for hearing loss, ear pain, congestion, sore throat, trouble swallowing, neck stiffness, dental problem, voice change and tinnitus.   Eyes: Negative for pain, discharge and visual disturbance.  Respiratory: Positive for shortness of breath. Negative for cough, chest tightness, wheezing and stridor.   Cardiovascular: Negative for chest pain, palpitations and leg swelling.  Gastrointestinal: Negative for nausea, vomiting, abdominal pain, diarrhea, constipation, blood in stool and abdominal distention.  Genitourinary: Negative for urgency, hematuria,  flank pain, discharge, difficulty urinating and genital sores.  Musculoskeletal: Negative for myalgias, back pain, joint swelling, arthralgias and gait problem.  Skin: Positive for rash.  Neurological: Positive for numbness. Negative for dizziness, syncope, speech difficulty, weakness and headaches.  Hematological: Negative for adenopathy. Does not bruise/bleed easily.  Psychiatric/Behavioral: Negative for behavioral problems and dysphoric mood. The patient is not nervous/anxious.  Objective:   Physical Exam  Constitutional: He is oriented to person, place, and time. He appears well-developed and well-nourished. No distress.  HENT:  Head: Normocephalic.  Right Ear: External ear normal.  Left Ear: External ear normal.  Eyes: Conjunctivae normal and EOM are normal.  Neck: Normal range of motion.  Cardiovascular: Normal rate and normal heart sounds.   Pulmonary/Chest: He has rales.  Abdominal: Bowel sounds are normal.  Musculoskeletal: Normal range of motion. He exhibits edema. He exhibits no tenderness.       +2 edema with stasis changes. Feet are warm but slightly discolored Dorsalis pedis pulses are intact  Neurological: He is alert and oriented to person, place, and time.  Skin: Rash noted.       Scattered excoriations present over the arms and upper back these appear to be fairly clean; some were quite large approximate diameter of 3 cm  Psychiatric: He has a normal mood and affect. His behavior is normal.          Assessment & Plan:   Congestive heart failure Diabetes with peripheral neuropathy. Will check a hemoglobin A 1C Hypertension COPD Excoriations  local skin care discussed We'll check electrolytes

## 2012-01-08 NOTE — Patient Instructions (Addendum)
Limit your sodium (Salt) intake   Please check your hemoglobin A1c every 3 months  Keep legs elevated as much as possible

## 2012-02-08 ENCOUNTER — Ambulatory Visit: Payer: Medicare Other | Admitting: Internal Medicine

## 2012-02-17 ENCOUNTER — Other Ambulatory Visit: Payer: Self-pay | Admitting: Internal Medicine

## 2012-02-17 NOTE — Telephone Encounter (Signed)
Spoke to pharmacist Rx picked up on 11/20 not 11/7.

## 2012-02-17 NOTE — Telephone Encounter (Signed)
Patient's spouse called back very upset stating that he need his medicine today.

## 2012-02-17 NOTE — Telephone Encounter (Signed)
Pt needs early refill on tramadol 50 mg 1 every 6 hrs(2 day early) call into [facility] [phone]

## 2012-02-17 NOTE — Telephone Encounter (Signed)
Spoke to pt told him unable to call in refill because you just picked up Rx on 11/20, 180 tablets and should have plenty and there is 2 refills left on prescription but too early. Pt verbalized understanding.

## 2012-02-17 NOTE — Telephone Encounter (Signed)
Called pharmacy and spoke to [facility], asked how many refills were left on Ultram? Htet said pt just picked up a refill on 01/28/2012, with 180 tablets and has 2 refills left. Told her thank you.

## 2012-02-25 ENCOUNTER — Inpatient Hospital Stay (HOSPITAL_COMMUNITY)
Admission: EM | Admit: 2012-02-25 | Discharge: 2012-04-23 | DRG: 237 | Disposition: E | Payer: Medicare Other | Attending: Pulmonary Disease | Admitting: Pulmonary Disease

## 2012-02-25 DIAGNOSIS — R4182 Altered mental status, unspecified: Secondary | ICD-10-CM

## 2012-02-25 DIAGNOSIS — E8809 Other disorders of plasma-protein metabolism, not elsewhere classified: Secondary | ICD-10-CM | POA: Diagnosis present

## 2012-02-25 DIAGNOSIS — J90 Pleural effusion, not elsewhere classified: Secondary | ICD-10-CM | POA: Diagnosis present

## 2012-02-25 DIAGNOSIS — E1129 Type 2 diabetes mellitus with other diabetic kidney complication: Secondary | ICD-10-CM | POA: Diagnosis present

## 2012-02-25 DIAGNOSIS — A0472 Enterocolitis due to Clostridium difficile, not specified as recurrent: Secondary | ICD-10-CM | POA: Diagnosis not present

## 2012-02-25 DIAGNOSIS — Z992 Dependence on renal dialysis: Secondary | ICD-10-CM

## 2012-02-25 DIAGNOSIS — E875 Hyperkalemia: Secondary | ICD-10-CM | POA: Diagnosis present

## 2012-02-25 DIAGNOSIS — I509 Heart failure, unspecified: Secondary | ICD-10-CM | POA: Diagnosis present

## 2012-02-25 DIAGNOSIS — E876 Hypokalemia: Secondary | ICD-10-CM | POA: Diagnosis present

## 2012-02-25 DIAGNOSIS — J4489 Other specified chronic obstructive pulmonary disease: Secondary | ICD-10-CM | POA: Diagnosis present

## 2012-02-25 DIAGNOSIS — E871 Hypo-osmolality and hyponatremia: Secondary | ICD-10-CM | POA: Diagnosis present

## 2012-02-25 DIAGNOSIS — Z7901 Long term (current) use of anticoagulants: Secondary | ICD-10-CM

## 2012-02-25 DIAGNOSIS — N170 Acute kidney failure with tubular necrosis: Secondary | ICD-10-CM | POA: Diagnosis not present

## 2012-02-25 DIAGNOSIS — I2589 Other forms of chronic ischemic heart disease: Secondary | ICD-10-CM | POA: Diagnosis present

## 2012-02-25 DIAGNOSIS — N390 Urinary tract infection, site not specified: Secondary | ICD-10-CM | POA: Diagnosis present

## 2012-02-25 DIAGNOSIS — I3139 Other pericardial effusion (noninflammatory): Secondary | ICD-10-CM | POA: Diagnosis present

## 2012-02-25 DIAGNOSIS — N186 End stage renal disease: Secondary | ICD-10-CM | POA: Diagnosis not present

## 2012-02-25 DIAGNOSIS — I469 Cardiac arrest, cause unspecified: Secondary | ICD-10-CM | POA: Diagnosis not present

## 2012-02-25 DIAGNOSIS — R579 Shock, unspecified: Secondary | ICD-10-CM

## 2012-02-25 DIAGNOSIS — I120 Hypertensive chronic kidney disease with stage 5 chronic kidney disease or end stage renal disease: Secondary | ICD-10-CM | POA: Diagnosis present

## 2012-02-25 DIAGNOSIS — J9600 Acute respiratory failure, unspecified whether with hypoxia or hypercapnia: Secondary | ICD-10-CM

## 2012-02-25 DIAGNOSIS — N179 Acute kidney failure, unspecified: Secondary | ICD-10-CM | POA: Diagnosis present

## 2012-02-25 DIAGNOSIS — J9610 Chronic respiratory failure, unspecified whether with hypoxia or hypercapnia: Secondary | ICD-10-CM

## 2012-02-25 DIAGNOSIS — I2510 Atherosclerotic heart disease of native coronary artery without angina pectoris: Secondary | ICD-10-CM | POA: Diagnosis present

## 2012-02-25 DIAGNOSIS — E162 Hypoglycemia, unspecified: Secondary | ICD-10-CM | POA: Diagnosis present

## 2012-02-25 DIAGNOSIS — E119 Type 2 diabetes mellitus without complications: Secondary | ICD-10-CM | POA: Diagnosis present

## 2012-02-25 DIAGNOSIS — D696 Thrombocytopenia, unspecified: Secondary | ICD-10-CM | POA: Diagnosis not present

## 2012-02-25 DIAGNOSIS — D509 Iron deficiency anemia, unspecified: Secondary | ICD-10-CM | POA: Diagnosis present

## 2012-02-25 DIAGNOSIS — Z87891 Personal history of nicotine dependence: Secondary | ICD-10-CM

## 2012-02-25 DIAGNOSIS — N2581 Secondary hyperparathyroidism of renal origin: Secondary | ICD-10-CM | POA: Diagnosis not present

## 2012-02-25 DIAGNOSIS — Z9861 Coronary angioplasty status: Secondary | ICD-10-CM

## 2012-02-25 DIAGNOSIS — G253 Myoclonus: Secondary | ICD-10-CM

## 2012-02-25 DIAGNOSIS — I319 Disease of pericardium, unspecified: Secondary | ICD-10-CM | POA: Diagnosis not present

## 2012-02-25 DIAGNOSIS — N183 Chronic kidney disease, stage 3 unspecified: Secondary | ICD-10-CM | POA: Diagnosis present

## 2012-02-25 DIAGNOSIS — J449 Chronic obstructive pulmonary disease, unspecified: Secondary | ICD-10-CM | POA: Diagnosis present

## 2012-02-25 DIAGNOSIS — A498 Other bacterial infections of unspecified site: Secondary | ICD-10-CM | POA: Diagnosis present

## 2012-02-25 DIAGNOSIS — Z66 Do not resuscitate: Secondary | ICD-10-CM | POA: Diagnosis not present

## 2012-02-25 DIAGNOSIS — I4891 Unspecified atrial fibrillation: Secondary | ICD-10-CM | POA: Diagnosis present

## 2012-02-25 DIAGNOSIS — R600 Localized edema: Secondary | ICD-10-CM | POA: Diagnosis present

## 2012-02-25 DIAGNOSIS — D649 Anemia, unspecified: Secondary | ICD-10-CM | POA: Diagnosis not present

## 2012-02-25 DIAGNOSIS — G9340 Encephalopathy, unspecified: Secondary | ICD-10-CM | POA: Diagnosis not present

## 2012-02-25 DIAGNOSIS — IMO0001 Reserved for inherently not codable concepts without codable children: Principal | ICD-10-CM | POA: Diagnosis present

## 2012-02-25 DIAGNOSIS — I5043 Acute on chronic combined systolic (congestive) and diastolic (congestive) heart failure: Secondary | ICD-10-CM | POA: Diagnosis present

## 2012-02-25 DIAGNOSIS — N058 Unspecified nephritic syndrome with other morphologic changes: Secondary | ICD-10-CM | POA: Diagnosis present

## 2012-02-25 DIAGNOSIS — G40401 Other generalized epilepsy and epileptic syndromes, not intractable, with status epilepticus: Secondary | ICD-10-CM | POA: Diagnosis not present

## 2012-02-25 DIAGNOSIS — F1021 Alcohol dependence, in remission: Secondary | ICD-10-CM

## 2012-02-25 DIAGNOSIS — J441 Chronic obstructive pulmonary disease with (acute) exacerbation: Secondary | ICD-10-CM

## 2012-02-25 DIAGNOSIS — A419 Sepsis, unspecified organism: Secondary | ICD-10-CM | POA: Diagnosis not present

## 2012-02-25 DIAGNOSIS — E1142 Type 2 diabetes mellitus with diabetic polyneuropathy: Secondary | ICD-10-CM | POA: Diagnosis present

## 2012-02-25 DIAGNOSIS — E1149 Type 2 diabetes mellitus with other diabetic neurological complication: Secondary | ICD-10-CM | POA: Diagnosis present

## 2012-02-25 DIAGNOSIS — I313 Pericardial effusion (noninflammatory): Secondary | ICD-10-CM | POA: Diagnosis present

## 2012-02-25 DIAGNOSIS — G40309 Generalized idiopathic epilepsy and epileptic syndromes, not intractable, without status epilepticus: Secondary | ICD-10-CM | POA: Diagnosis not present

## 2012-02-25 DIAGNOSIS — I5042 Chronic combined systolic (congestive) and diastolic (congestive) heart failure: Secondary | ICD-10-CM

## 2012-02-25 DIAGNOSIS — Z950 Presence of cardiac pacemaker: Secondary | ICD-10-CM

## 2012-02-25 DIAGNOSIS — R339 Retention of urine, unspecified: Secondary | ICD-10-CM | POA: Diagnosis present

## 2012-02-25 DIAGNOSIS — J209 Acute bronchitis, unspecified: Secondary | ICD-10-CM

## 2012-02-25 DIAGNOSIS — K5900 Constipation, unspecified: Secondary | ICD-10-CM | POA: Diagnosis not present

## 2012-02-25 DIAGNOSIS — E46 Unspecified protein-calorie malnutrition: Secondary | ICD-10-CM | POA: Diagnosis not present

## 2012-02-25 DIAGNOSIS — Z515 Encounter for palliative care: Secondary | ICD-10-CM

## 2012-02-26 ENCOUNTER — Emergency Department (HOSPITAL_COMMUNITY): Payer: Medicare Other

## 2012-02-26 ENCOUNTER — Encounter (HOSPITAL_COMMUNITY): Payer: Self-pay | Admitting: *Deleted

## 2012-02-26 DIAGNOSIS — I5043 Acute on chronic combined systolic (congestive) and diastolic (congestive) heart failure: Secondary | ICD-10-CM | POA: Diagnosis present

## 2012-02-26 DIAGNOSIS — E119 Type 2 diabetes mellitus without complications: Secondary | ICD-10-CM

## 2012-02-26 DIAGNOSIS — D509 Iron deficiency anemia, unspecified: Secondary | ICD-10-CM | POA: Diagnosis present

## 2012-02-26 DIAGNOSIS — R600 Localized edema: Secondary | ICD-10-CM | POA: Diagnosis present

## 2012-02-26 DIAGNOSIS — N390 Urinary tract infection, site not specified: Secondary | ICD-10-CM | POA: Diagnosis present

## 2012-02-26 DIAGNOSIS — N183 Chronic kidney disease, stage 3 unspecified: Secondary | ICD-10-CM | POA: Diagnosis present

## 2012-02-26 DIAGNOSIS — R609 Edema, unspecified: Secondary | ICD-10-CM

## 2012-02-26 LAB — COMPREHENSIVE METABOLIC PANEL
ALT: 11 U/L (ref 0–53)
Alkaline Phosphatase: 84 U/L (ref 39–117)
BUN: 53 mg/dL — ABNORMAL HIGH (ref 6–23)
Chloride: 91 mEq/L — ABNORMAL LOW (ref 96–112)
GFR calc Af Amer: 47 mL/min — ABNORMAL LOW (ref >90)
Glucose, Bld: 117 mg/dL — ABNORMAL HIGH (ref 70–99)
Potassium: 3.4 mEq/L — ABNORMAL LOW (ref 3.5–5.1)
Sodium: 135 mEq/L (ref 135–145)
Total Bilirubin: 0.9 mg/dL (ref 0.3–1.2)

## 2012-02-26 LAB — BASIC METABOLIC PANEL
Calcium: 8.9 mg/dL (ref 8.4–10.5)
Creatinine, Ser: 1.69 mg/dL — ABNORMAL HIGH (ref 0.50–1.35)
GFR calc Af Amer: 47 mL/min — ABNORMAL LOW (ref >90)
GFR calc non Af Amer: 41 mL/min — ABNORMAL LOW (ref >90)

## 2012-02-26 LAB — URINALYSIS, ROUTINE W REFLEX MICROSCOPIC
Glucose, UA: NEGATIVE mg/dL
Ketones, ur: NEGATIVE mg/dL
Protein, ur: 30 mg/dL — AB

## 2012-02-26 LAB — CBC
MCH: 23.4 pg — ABNORMAL LOW (ref 26.0–34.0)
MCHC: 30.5 g/dL (ref 30.0–36.0)
MCV: 76.8 fL — ABNORMAL LOW (ref 78.0–100.0)
MCV: 77.3 fL — ABNORMAL LOW (ref 78.0–100.0)
Platelets: 196 10*3/uL (ref 150–400)
Platelets: 199 10*3/uL (ref 150–400)
RDW: 19.1 % — ABNORMAL HIGH (ref 11.5–15.5)
RDW: 18.9 % — ABNORMAL HIGH (ref 11.5–15.5)
WBC: 5.6 10*3/uL (ref 4.0–10.5)

## 2012-02-26 LAB — GLUCOSE, CAPILLARY
Glucose-Capillary: 114 mg/dL — ABNORMAL HIGH (ref 70–99)
Glucose-Capillary: 181 mg/dL — ABNORMAL HIGH (ref 70–99)
Glucose-Capillary: 144 mg/dL — ABNORMAL HIGH (ref 70–99)

## 2012-02-26 LAB — URINE MICROSCOPIC-ADD ON

## 2012-02-26 LAB — PROTIME-INR: Prothrombin Time: 25.8 seconds — ABNORMAL HIGH (ref 11.6–15.2)

## 2012-02-26 MED ORDER — ZOLPIDEM TARTRATE 5 MG PO TABS
5.0000 mg | ORAL_TABLET | Freq: Every evening | ORAL | Status: DC | PRN
  Administered 2012-03-08: 5 mg via ORAL
  Filled 2012-02-26: qty 1

## 2012-02-26 MED ORDER — WARFARIN - PHARMACIST DOSING INPATIENT: Freq: Every day | Status: DC

## 2012-02-26 MED ORDER — POTASSIUM CHLORIDE CRYS ER 20 MEQ PO TBCR
20.0000 meq | EXTENDED_RELEASE_TABLET | Freq: Three times a day (TID) | ORAL | Status: DC
  Administered 2012-02-26 (×2): 20 meq via ORAL
  Filled 2012-02-26 (×3): qty 1

## 2012-02-26 MED ORDER — ONDANSETRON HCL 4 MG/2ML IJ SOLN
4.0000 mg | Freq: Four times a day (QID) | INTRAMUSCULAR | Status: DC | PRN
  Administered 2012-03-02: 4 mg via INTRAVENOUS
  Filled 2012-02-26: qty 2

## 2012-02-26 MED ORDER — FUROSEMIDE 10 MG/ML IJ SOLN
80.0000 mg | Freq: Two times a day (BID) | INTRAMUSCULAR | Status: DC
  Filled 2012-02-26 (×2): qty 8

## 2012-02-26 MED ORDER — BUDESONIDE-FORMOTEROL FUMARATE 160-4.5 MCG/ACT IN AERO
2.0000 | INHALATION_SPRAY | Freq: Two times a day (BID) | RESPIRATORY_TRACT | Status: DC
  Administered 2012-02-26 – 2012-03-20 (×36): 2 via RESPIRATORY_TRACT
  Filled 2012-02-26 (×2): qty 6

## 2012-02-26 MED ORDER — CEFTRIAXONE SODIUM 1 G IJ SOLR
1.0000 g | INTRAMUSCULAR | Status: DC
  Administered 2012-02-27 – 2012-02-29 (×3): 1 g via INTRAVENOUS
  Filled 2012-02-26 (×3): qty 10

## 2012-02-26 MED ORDER — FUROSEMIDE 10 MG/ML IJ SOLN
80.0000 mg | Freq: Once | INTRAMUSCULAR | Status: AC
  Administered 2012-02-26: 80 mg via INTRAVENOUS
  Filled 2012-02-26: qty 8

## 2012-02-26 MED ORDER — TRAMADOL HCL 50 MG PO TABS
50.0000 mg | ORAL_TABLET | Freq: Four times a day (QID) | ORAL | Status: DC | PRN
  Administered 2012-02-26 – 2012-03-06 (×13): 50 mg via ORAL
  Filled 2012-02-26 (×14): qty 1

## 2012-02-26 MED ORDER — INSULIN ASPART 100 UNIT/ML ~~LOC~~ SOLN
0.0000 [IU] | Freq: Three times a day (TID) | SUBCUTANEOUS | Status: DC
  Administered 2012-02-26: 2 [IU] via SUBCUTANEOUS
  Administered 2012-02-29 – 2012-03-01 (×3): 1 [IU] via SUBCUTANEOUS
  Administered 2012-03-01: 3 [IU] via SUBCUTANEOUS
  Administered 2012-03-02: 2 [IU] via SUBCUTANEOUS
  Administered 2012-03-02 (×2): 1 [IU] via SUBCUTANEOUS
  Administered 2012-03-03 – 2012-03-04 (×3): 2 [IU] via SUBCUTANEOUS
  Administered 2012-03-04: 7 [IU] via SUBCUTANEOUS
  Administered 2012-03-04: 2 [IU] via SUBCUTANEOUS
  Administered 2012-03-05: 1 [IU] via SUBCUTANEOUS
  Administered 2012-03-07: 2 [IU] via SUBCUTANEOUS
  Administered 2012-03-07 – 2012-03-10 (×7): 1 [IU] via SUBCUTANEOUS

## 2012-02-26 MED ORDER — DIPHENHYDRAMINE-ZINC ACETATE 2-0.1 % EX CREA
TOPICAL_CREAM | Freq: Three times a day (TID) | CUTANEOUS | Status: DC | PRN
  Administered 2012-02-27: 1 via TOPICAL
  Filled 2012-02-26: qty 28

## 2012-02-26 MED ORDER — POTASSIUM CHLORIDE CRYS ER 20 MEQ PO TBCR
20.0000 meq | EXTENDED_RELEASE_TABLET | Freq: Four times a day (QID) | ORAL | Status: DC
  Administered 2012-02-26 – 2012-02-27 (×4): 20 meq via ORAL
  Filled 2012-02-26 (×6): qty 1

## 2012-02-26 MED ORDER — WARFARIN SODIUM 2.5 MG PO TABS
2.5000 mg | ORAL_TABLET | Freq: Once | ORAL | Status: AC
  Administered 2012-02-26: 2.5 mg via ORAL
  Filled 2012-02-26: qty 1

## 2012-02-26 MED ORDER — ONDANSETRON HCL 4 MG PO TABS
4.0000 mg | ORAL_TABLET | Freq: Four times a day (QID) | ORAL | Status: DC | PRN
  Administered 2012-03-01 – 2012-03-08 (×3): 4 mg via ORAL
  Filled 2012-02-26 (×3): qty 1

## 2012-02-26 MED ORDER — CALCIUM CARBONATE ANTACID 500 MG PO CHEW
1.0000 | CHEWABLE_TABLET | Freq: Once | ORAL | Status: AC
  Administered 2012-02-26: 200 mg via ORAL
  Filled 2012-02-26: qty 1

## 2012-02-26 MED ORDER — METOPROLOL SUCCINATE ER 50 MG PO TB24
50.0000 mg | ORAL_TABLET | Freq: Every day | ORAL | Status: DC
  Administered 2012-02-26 – 2012-03-08 (×10): 50 mg via ORAL
  Filled 2012-02-26 (×14): qty 1

## 2012-02-26 MED ORDER — ACETAMINOPHEN 325 MG PO TABS
650.0000 mg | ORAL_TABLET | Freq: Four times a day (QID) | ORAL | Status: DC | PRN
  Administered 2012-03-08 – 2012-03-09 (×2): 650 mg via ORAL
  Filled 2012-02-26 (×2): qty 2

## 2012-02-26 MED ORDER — INSULIN GLARGINE 100 UNIT/ML ~~LOC~~ SOLN
30.0000 [IU] | Freq: Every day | SUBCUTANEOUS | Status: DC
  Administered 2012-02-26: 30 [IU] via SUBCUTANEOUS

## 2012-02-26 MED ORDER — ACETAMINOPHEN 650 MG RE SUPP: 650.0000 mg | Freq: Four times a day (QID) | RECTAL | Status: DC | PRN

## 2012-02-26 MED ORDER — INFLUENZA VIRUS VACC SPLIT PF IM SUSP
0.5000 mL | INTRAMUSCULAR | Status: AC
  Administered 2012-02-27: 0.5 mL via INTRAMUSCULAR
  Filled 2012-02-26: qty 0.5

## 2012-02-26 MED ORDER — TIOTROPIUM BROMIDE MONOHYDRATE 18 MCG IN CAPS
18.0000 ug | ORAL_CAPSULE | Freq: Every day | RESPIRATORY_TRACT | Status: DC
  Administered 2012-02-26 – 2012-03-20 (×17): 18 ug via RESPIRATORY_TRACT
  Filled 2012-02-26 (×4): qty 5

## 2012-02-26 MED ORDER — SODIUM CHLORIDE 0.9 % IJ SOLN
3.0000 mL | Freq: Two times a day (BID) | INTRAMUSCULAR | Status: DC
  Administered 2012-02-26 – 2012-03-13 (×23): 3 mL via INTRAVENOUS
  Administered 2012-03-14: 11:00:00 via INTRAVENOUS
  Administered 2012-03-15 – 2012-03-22 (×12): 3 mL via INTRAVENOUS

## 2012-02-26 MED ORDER — SODIUM CHLORIDE 0.9 % IJ SOLN
3.0000 mL | Freq: Two times a day (BID) | INTRAMUSCULAR | Status: DC
  Administered 2012-02-27 – 2012-03-01 (×7): 3 mL via INTRAVENOUS

## 2012-02-26 MED ORDER — ALBUTEROL SULFATE HFA 108 (90 BASE) MCG/ACT IN AERS
2.0000 | INHALATION_SPRAY | Freq: Four times a day (QID) | RESPIRATORY_TRACT | Status: DC | PRN
  Administered 2012-02-29 – 2012-03-06 (×3): 2 via RESPIRATORY_TRACT
  Filled 2012-02-26: qty 6.7

## 2012-02-26 MED ORDER — TEMAZEPAM 15 MG PO CAPS: 30.0000 mg | ORAL_CAPSULE | Freq: Every evening | ORAL | Status: DC | PRN

## 2012-02-26 MED ORDER — POTASSIUM CHLORIDE CRYS ER 20 MEQ PO TBCR
20.0000 meq | EXTENDED_RELEASE_TABLET | Freq: Two times a day (BID) | ORAL | Status: DC
  Filled 2012-02-26 (×2): qty 1

## 2012-02-26 MED ORDER — METOLAZONE 2.5 MG PO TABS
2.5000 mg | ORAL_TABLET | Freq: Every day | ORAL | Status: DC
Start: 2012-02-26 — End: 2012-03-03
  Administered 2012-02-26 – 2012-03-03 (×7): 2.5 mg via ORAL
  Filled 2012-02-26 (×8): qty 1

## 2012-02-26 MED ORDER — FUROSEMIDE 10 MG/ML IJ SOLN
80.0000 mg | Freq: Four times a day (QID) | INTRAMUSCULAR | Status: DC
  Administered 2012-02-26 – 2012-02-27 (×3): 80 mg via INTRAVENOUS
  Filled 2012-02-26 (×8): qty 8

## 2012-02-26 MED ORDER — DEXTROSE 5 % IV SOLN
1.0000 g | Freq: Once | INTRAVENOUS | Status: AC
  Administered 2012-02-26: 1 g via INTRAVENOUS
  Filled 2012-02-26: qty 10

## 2012-02-26 MED ORDER — SIMVASTATIN 40 MG PO TABS
40.0000 mg | ORAL_TABLET | Freq: Every day | ORAL | Status: DC
  Administered 2012-02-26 – 2012-03-28 (×29): 40 mg via ORAL
  Filled 2012-02-26 (×35): qty 1

## 2012-02-26 MED ORDER — METOPROLOL SUCCINATE ER 25 MG PO TB24
25.0000 mg | ORAL_TABLET | Freq: Every day | ORAL | Status: DC
  Administered 2012-02-26 – 2012-03-03 (×7): 25 mg via ORAL
  Filled 2012-02-26 (×8): qty 1

## 2012-02-26 MED ORDER — PANTOPRAZOLE SODIUM 40 MG PO TBEC
40.0000 mg | DELAYED_RELEASE_TABLET | Freq: Every day | ORAL | Status: DC
  Administered 2012-02-26 – 2012-03-20 (×22): 40 mg via ORAL
  Filled 2012-02-26 (×24): qty 1

## 2012-02-26 MED ORDER — GABAPENTIN 100 MG PO CAPS
100.0000 mg | ORAL_CAPSULE | Freq: Three times a day (TID) | ORAL | Status: DC
  Administered 2012-02-26 – 2012-03-21 (×59): 100 mg via ORAL
  Filled 2012-02-26 (×83): qty 1

## 2012-02-26 NOTE — H&P (Signed)
[name] is an 66 y.o. male.  Patient was seen and examined on February 26, 2012. PCP - Dr. Madison Hickman.  Chief Complaint: Shortness of breath or lower extremity edema. HPI: 66 year-old male with known history of ischemic cardiomyopathy, history of CAD status post stenting, atrial fibrillation status post AV nodal ablation and pacemaker placement on Coumadin, diabetes mellitus, COPD presents with complaints of worsening extremity edema and shortness of breath. Patient states that he gets short of breath on minimal exertion and cannot lie flat. Denies any chest pain fever or any productive cough. Has been taking his medications as advised. In addition patient has been noticing dysuria. Denies any nausea vomiting abdominal pain. In the ER patient was given Lasix 80 mg IV and also ceftriaxone for UTI.  Past Medical History  Diagnosis Date  . ALCOHOL ABUSE, HX OF 04/30/2008  . 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 Atrial flutter 09/20/2006  . 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 Chronic systolic heart failure 09/20/2006    previous EF 25%;  Echocardiogram 12/10/10: Mild-moderate LVH, EF 50-55%, mild MR, moderate LAE, moderate RAE, mild RVE with moderately reduced RV SF, PASP 50, small pericardial effusion  . COPD 12/17/2006  . CORONARY ARTERY DISEASE 09/20/2006    s/p stent to OM;  catheterization 12/06: Mid to distal LAD 30%, D2 20%, proximal circumflex 30%, OM 2 stents patent, ostial RCA 50%, mid RCA 40%, distal RCA multiple 40%  . 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 DYSPEPSIA&OTHER [facility] DISORDERS FUNCTION STOMACH 04/04/2009  . HYPERTENSION 12/17/2006  . OBESITY 04/30/2008  . HLD (hyperlipidemia) 09/20/2006  . OTITIS EXTERNA, CHRONIC NEC 12/28/2006  . PERSONAL HX COLONIC POLYPS 05/02/2009  . PSORIASIS 04/30/2008  . CKD (chronic kidney disease) 04/30/2008    creatinine 2.2 range    Past Surgical  History  Procedure Date  . Cardiac defibrillator placement   . Angioplasty   . Hernia repair     bilat  . Cardiac catheterization   . Coronary angioplasty     Stents placed  . Transthoracic echocardiogram 2005/2006/2008/2011    Family History  Problem Relation Age of Onset  . Lung cancer Mother     and aunt  . Heart attack Father   . Diabetes Sister     and aunt  . Colon cancer Neg Hx    Social History:  reports that he quit smoking about 6 years ago. His smoking use included Cigarettes. He has a 50 pack-year smoking history. He has never used smokeless tobacco. He reports that he does not drink alcohol or use illicit drugs.  Allergies:  Allergies  Allergen Reactions  . Aspirin     REACTION: Swelling, trouble breathing  . Tetracycline Hcl     REACTION: Swelling, trouble breathing     (Not in a hospital admission)  Results for orders placed during the hospital encounter of 03/19/2012 (from the past 48 hour(s))  CBC     Status: Abnormal   Collection Time   02/26/12  1:05 AM      Component Value Range Comment   WBC 5.4  4.0 - 10.5 K/uL    RBC 4.57  4.22 - 5.81 MIL/uL    Hemoglobin 10.7 (*) 13.0 - 17.0 g/dL    HCT 16.1 (*) 09.6 - 52.0 %    MCV 76.8 (*) 78.0 - 100.0 fL    MCH 23.4 (*) 26.0 - 34.0  pg    MCHC 30.5  30.0 - 36.0 g/dL    RDW 45.4 (*) 09.8 - 15.5 %    Platelets 196  150 - 400 K/uL   BASIC METABOLIC PANEL     Status: Abnormal   Collection Time   02/26/12  1:05 AM      Component Value Range Comment   Sodium 132 (*) 135 - 145 mEq/L    Potassium 3.6  3.5 - 5.1 mEq/L    Chloride 91 (*) 96 - 112 mEq/L    CO2 31  19 - 32 mEq/L    Glucose, Bld 112 (*) 70 - 99 mg/dL    BUN 54 (*) 6 - 23 mg/dL    Creatinine, Ser 1.19 (*) 0.50 - 1.35 mg/dL    Calcium 8.9  8.4 - 14.7 mg/dL    GFR calc non Af Amer 41 (*) >90 mL/min    GFR calc Af Amer 47 (*) >90 mL/min   PROTIME-INR     Status: Abnormal   Collection Time   02/26/12  1:05 AM      Component Value Range Comment    Prothrombin Time 25.8 (*) 11.6 - 15.2 seconds    INR 2.50 (*) 0.00 - 1.49   PRO B NATRIURETIC PEPTIDE     Status: Abnormal   Collection Time   02/26/12  1:05 AM      Component Value Range Comment   Pro B Natriuretic peptide (BNP) 8685.0 (*) 0 - 125 pg/mL   CK TOTAL AND CKMB     Status: Abnormal   Collection Time   02/26/12  1:05 AM      Component Value Range Comment   Total CK 91  7 - 232 U/L    CK, MB 4.6 (*) 0.3 - 4.0 ng/mL    Relative Index RELATIVE INDEX IS INVALID  0.0 - 2.5   TROPONIN I     Status: Normal   Collection Time   02/26/12  1:05 AM      Component Value Range Comment   Troponin I <0.30  <0.30 ng/mL   URINALYSIS, ROUTINE W REFLEX MICROSCOPIC     Status: Abnormal   Collection Time   02/26/12  1:27 AM      Component Value Range Comment   Color, Urine YELLOW  YELLOW    APPearance CLOUDY (*) CLEAR    Specific Gravity, Urine 1.009  1.005 - 1.030    pH 6.0  5.0 - 8.0    Glucose, UA NEGATIVE  NEGATIVE mg/dL    Hgb urine dipstick TRACE (*) NEGATIVE    Bilirubin Urine NEGATIVE  NEGATIVE    Ketones, ur NEGATIVE  NEGATIVE mg/dL    Protein, ur 30 (*) NEGATIVE mg/dL    Urobilinogen, UA 1.0  0.0 - 1.0 mg/dL    Nitrite NEGATIVE  NEGATIVE    Leukocytes, UA LARGE (*) NEGATIVE   URINE MICROSCOPIC-ADD ON     Status: Abnormal   Collection Time   02/26/12  1:27 AM      Component Value Range Comment   WBC, UA TOO NUMEROUS TO COUNT  <3 WBC/hpf    RBC / HPF 0-2  <3 RBC/hpf    Bacteria, UA MANY (*) RARE    Dg Chest 2 View  02/26/2012  *RADIOLOGY REPORT*  Clinical Data: Worsening shortness of breath and lower extremity edema.  CHEST - 2 VIEW  Comparison: 11/15/2011  Findings: Moderate cardiomegaly and chronic pulmonary vascular congestion are stable.  Increased small right  pleural effusion and right lower lobe atelectasis demonstrated.  No evidence of pulmonary consolidation. Azygos fissure again noted.  Transvenous pacemaker remains in stable position.  IMPRESSION:  1.  Increased small  right pleural effusion and right lower lobe atelectasis. 2.  Stable cardiomegaly and chronic pulmonary venous hypertension.   Original Report Authenticated By: Myles Rosenthal, M.D.     Review of Systems  Constitutional: Negative.   HENT: Negative.   Eyes: Negative.   Respiratory: Positive for shortness of breath.   Cardiovascular: Negative.   Genitourinary: Positive for dysuria.  Musculoskeletal:       Lower extremity edema.  Skin: Negative.   Neurological: Negative.   Psychiatric/Behavioral: Negative.     Blood pressure 135/74, pulse 82, temperature 97.8 F (36.6 C), temperature source Oral, resp. rate 20, SpO2 100.00%. Physical Exam  Constitutional: He is oriented to person, place, and time. He appears well-developed and well-nourished. No distress.  HENT:  Head: Normocephalic and atraumatic.  Right Ear: External ear normal.  Left Ear: External ear normal.  Nose: Nose normal.  Mouth/Throat: Oropharynx is clear and moist. No oropharyngeal exudate.  Eyes: Conjunctivae normal are normal. Pupils are equal, round, and reactive to light. Right eye exhibits no discharge. Left eye exhibits no discharge. No scleral icterus.  Neck: Normal range of motion. Neck supple.  Cardiovascular: Normal rate and regular rhythm.   Respiratory: Effort normal and breath sounds normal. No respiratory distress. He has no wheezes. He has no rales.  GI: Soft. Bowel sounds are normal. He exhibits no distension. There is no tenderness. There is no rebound and no guarding.  Musculoskeletal:       Bilateral lower extremity edema.  Neurological: He is alert and oriented to person, place, and time.  Skin: He is not diaphoretic.       Chronic skin changes lower extremities.     Assessment/Plan #1. Decompensated systolic heart failure with last EF measured was 4550% and previously was 25% - continue with Lasix 80 mg IV every 12 may need more. Continue metolazone. Closely follow metabolic panel and intake output.  Cycle cardiac markers. #2. UTI - follow urine cultures and continue ceftriaxone. #3. Atrial fibrillation status post AV node ablation and pacemaker placement on Coumadin presently rate controlled - Coumadin per pharmacy. #4. Diabetes mellitus2 - insulin per sliding scale.  #5. COPD - presently not wheezing. Continue inhalers. #6. Chronic kidney disease - creatinine baseline. Closely follow metabolic panel intake and output. #7. CAD status post stenting - denies chest pain. Cycle cardiac markers due to #1.  CODE STATUS - full code.  [name] N. 02/26/2012, 4:41 AM

## 2012-02-26 NOTE — ED Notes (Signed)
Patient is alert and oriented x3.  He is complaining of shortness of breath along with swelling in BLE. He states that he previously had a UTI and was prescribed antibiotics but unable to afford them.   He adds that he has a Hx of CHF and was instructed to take an extra fluid pill but it has not helped. He denies any pain but has taken pain medication before coming to the ED.

## 2012-02-26 NOTE — ED Provider Notes (Signed)
  Medical screening examination/treatment/procedure(s) were conducted as a shared visit with non-physician practitioner(s) and myself.  I personally evaluated the patient during the encounter  Please see my separate respective documentation pertaining to this patient encounter   [name] D Jahn Franchini, MD 02/26/12 0712 

## 2012-02-26 NOTE — ED Provider Notes (Signed)
52 her old male with a history of congestive heart failure who presents with gradual onset worsening bilateral lower extremity swelling as well as difficulty breathing and shortness of breath. On exam the patient has 2+ pitting edema bilaterally without significant asymmetry, increased redness to the bilateral legs but no significant warmth or tenderness to the skin. He has increased work of breathing which is mild, associated with decreased air movement and mild rales. The patient likely is having congestive heart failure exacerbation, he'll need to be admitted to the hospital for further diuresis and evaluation.   Medical screening examination/treatment/procedure(s) were conducted as a shared visit with non-physician practitioner(s) and myself.  I personally evaluated the patient during the encounter    Vida Roller, MD 02/26/12 [phone]

## 2012-02-26 NOTE — Progress Notes (Signed)
*  Preliminary Results* Bilateral lower extremity venous duplex completed. Visualized veins of bilateral lower extremities are negative for deep vein thrombosis. Unable to visualize the right posterior tibial and peroneal veins in their entirety, therefore cannot exclude deep vein thrombosis in non visualized segments. No evidence of Baker's cyst bilaterally. Preliminary results discussed with Okey Regal, RN.  02/26/2012 10:30 AM Gertie Fey, RDMS, RDCS

## 2012-02-26 NOTE — Progress Notes (Signed)
Report received from ER nurse. Await arrival of patient to room 1416. [name]

## 2012-02-26 NOTE — Progress Notes (Signed)
ANTICOAGULATION CONSULT NOTE - Follow Up  Pharmacy Consult for Warfarin Indication: atrial fibrillation  Allergies  Allergen Reactions  . Aspirin     REACTION: Swelling, trouble breathing  . Tetracycline Hcl     REACTION: Swelling, trouble breathing    Patient Measurements: Height: 5\' 5"  (165.1 cm) Weight: 159 lb 2.8 oz (72.2 kg) IBW/kg (Calculated) : 61.5  Wt=68 kg  Vital Signs: Temp: 97.4 F (36.3 C) (12/06 0555) Temp src: Oral (12/06 0555) BP: 138/73 mmHg (12/06 0555) Pulse Rate: 82  (12/06 0555)  Labs:  Basename 02/26/12 0650 02/26/12 0648 02/26/12 0105  HGB -- 11.1* 10.7*  HCT -- 36.4* 35.1*  PLT -- 199 196  APTT -- -- --  LABPROT -- -- 25.8*  INR -- -- 2.50*  HEPARINUNFRC -- -- --  CREATININE -- 1.69* 1.69*  CKTOTAL -- -- 91  CKMB -- -- 4.6*  TROPONINI <0.30 -- <0.30    Estimated Creatinine Clearance: 37.4 ml/min (by C-G formula based on Cr of 1.69).   Medical History: Past Medical History  Diagnosis Date  . ALCOHOL ABUSE, HX OF 04/30/2008  . 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 Atrial flutter 09/20/2006  . 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 Chronic systolic heart failure 09/20/2006    previous EF 25%;  Echocardiogram 12/10/10: Mild-moderate LVH, EF 50-55%, mild MR, moderate LAE, moderate RAE, mild RVE with moderately reduced RV SF, PASP 50, small pericardial effusion  . COPD 12/17/2006  . CORONARY ARTERY DISEASE 09/20/2006    s/p stent to OM;  catheterization 12/06: Mid to distal LAD 30%, D2 20%, proximal circumflex 30%, OM 2 stents patent, ostial RCA 50%, mid RCA 40%, distal RCA multiple 40%  . 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 DYSPEPSIA&OTHER [facility] DISORDERS FUNCTION STOMACH 04/04/2009  . HYPERTENSION 12/17/2006  . OBESITY 04/30/2008  . HLD (hyperlipidemia) 09/20/2006  . OTITIS EXTERNA, CHRONIC NEC 12/28/2006  . PERSONAL HX COLONIC POLYPS 05/02/2009   . PSORIASIS 04/30/2008  . CKD (chronic kidney disease) 04/30/2008    creatinine 2.2 range    Medications:  Scheduled:     . budesonide-formoterol  2 puff Inhalation BID  . [COMPLETED] cefTRIAXone (ROCEPHIN)  IV  1 g Intravenous Once  . cefTRIAXone (ROCEPHIN)  IV  1 g Intravenous Q24H  . [COMPLETED] furosemide  80 mg Intravenous Once  . furosemide  80 mg Intravenous Q12H  . gabapentin  100 mg Oral TID  . influenza  inactive virus vaccine  0.5 mL Intramuscular Tomorrow-1000  . insulin aspart  0-9 Units Subcutaneous TID WC  . insulin glargine  30 Units Subcutaneous QHS  . metolazone  2.5 mg Oral Daily  . metoprolol succinate  25 mg Oral Daily  . metoprolol succinate  50 mg Oral QHS  . pantoprazole  40 mg Oral Daily  . potassium chloride SA  20 mEq Oral TID  . simvastatin  40 mg Oral QHS  . sodium chloride  3 mL Intravenous Q12H  . sodium chloride  3 mL Intravenous Q12H  . tiotropium  18 mcg Inhalation Daily  . [DISCONTINUED] potassium chloride SA  20 mEq Oral BID   Infusions:    Assessment:  66 admitted c/o SOB and LE edema on chronic coumadin for A-fib.   Pt on 2.5mg  daily @ home.  INR therapeutic  No bleeding per notes  Goal of Therapy:  INR 2-3    Plan:  Continue home dose as above - 2.5mg  tonight  Daily PT/INR   Hessie Knows, PharmD, BCPS Pager [phone] 02/26/2012 9:02 AM

## 2012-02-26 NOTE — Progress Notes (Signed)
Attempted to call report to [address] [place]. Told RN could not take report d/t her initiating a code stroke on another patient. Will re-attempt report again shortly.

## 2012-02-26 NOTE — ED Provider Notes (Signed)
History     CSN: [id number]  Arrival date & time 03/11/2012  2352   First MD Initiated Contact with Patient 02/26/12 0033      Chief Complaint  Patient presents with  . Shortness of Breath  . Leg Swelling    BLE swelling   HPI  History provided by the patient and spouse. Patient is a 66 year old male with history of diabetes, chronic kidney disease, hypertension, CAD, CHF, COPD and atrial flutter who presents with complaints of worsening shortness of breath and increased bilateral lower extremity swelling for the past one week. Patient reports taking his medications regularly as instructed. Symptoms have been gradually worsening. Patient has increased risk of breath with any activity or exertion. Patient also has difficulty laying flat. Patient is currently taking 80 mg Lasix twice a day. Patient states he has been taking this. Patient also reports some complaints of dysuria and urinary frequency. Patient states urine has looked cloudy and looks similar to previous urinary tract infection. He denies having any abdominal pain or flank pains. Denies any hematuria.     Past Medical History  Diagnosis Date  . ALCOHOL ABUSE, HX OF 04/30/2008  . 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 Atrial flutter 09/20/2006  . 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 Chronic systolic heart failure 09/20/2006    previous EF 25%;  Echocardiogram 12/10/10: Mild-moderate LVH, EF 50-55%, mild MR, moderate LAE, moderate RAE, mild RVE with moderately reduced RV SF, PASP 50, small pericardial effusion  . COPD 12/17/2006  . CORONARY ARTERY DISEASE 09/20/2006    s/p stent to OM;  catheterization 12/06: Mid to distal LAD 30%, D2 20%, proximal circumflex 30%, OM 2 stents patent, ostial RCA 50%, mid RCA 40%, distal RCA multiple 40%  . 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 DYSPEPSIA&OTHER [facility] DISORDERS FUNCTION STOMACH 04/04/2009   . HYPERTENSION 12/17/2006  . OBESITY 04/30/2008  . HLD (hyperlipidemia) 09/20/2006  . OTITIS EXTERNA, CHRONIC NEC 12/28/2006  . PERSONAL HX COLONIC POLYPS 05/02/2009  . PSORIASIS 04/30/2008  . CKD (chronic kidney disease) 04/30/2008    creatinine 2.2 range    Past Surgical History  Procedure Date  . Cardiac defibrillator placement   . Angioplasty   . Hernia repair     bilat  . Cardiac catheterization   . Coronary angioplasty     Stents placed  . Transthoracic echocardiogram 2005/2006/2008/2011    Family History  Problem Relation Age of Onset  . Lung cancer Mother     and aunt  . Heart attack Father   . Diabetes Sister     and aunt  . Colon cancer Neg Hx     History  Substance Use Topics  . Smoking status: Former Smoker -- 1.0 packs/day for 50 years    Types: Cigarettes    Quit date: 03/23/2005  . Smokeless tobacco: Never Used  . Alcohol Use: No     Comment: quit drinking 2007      Review of Systems  Constitutional: Negative for fever and chills.  Respiratory: Positive for cough and shortness of breath.   Cardiovascular: Positive for leg swelling. Negative for chest pain and palpitations.  Gastrointestinal: Negative for nausea, vomiting and diarrhea.  Genitourinary: Positive for dysuria. Negative for frequency, hematuria and flank pain.  Skin: Negative for rash.  All other systems reviewed and are negative.    Allergies  Aspirin and Tetracycline hcl  Home  Medications   Current Outpatient Rx  Name  Route  Sig  Dispense  Refill  . ALBUTEROL SULFATE HFA 108 (90 BASE) MCG/ACT IN AERS   Inhalation   Inhale 2 puffs into the lungs every 6 (six) hours as needed. For shortness of breath   1 Inhaler   6   . BUDESONIDE-FORMOTEROL FUMARATE 160-4.5 MCG/ACT IN AERO   Inhalation   Inhale 2 puffs into the lungs 2 (two) times daily.   3 Inhaler   1   . CO-ENZYME Q-10 PO   Oral   Take 1 capsule by mouth daily.         . FUROSEMIDE 80 MG PO TABS   Oral   Take 80  mg by mouth 2 (two) times daily.         Marland Kitchen GABAPENTIN 100 MG PO CAPS   Oral   Take 1 capsule (100 mg total) by mouth 3 (three) times daily.   90 capsule   3   . HYDROCORTISONE 1 % EX CREA      Apply to affected area 2 times daily   15 g   0   . INSULIN GLARGINE 100 UNIT/ML [place] SOLN      30 units at bedtime   10 mL   6   . INSULIN LISPRO (HUMAN) 100 UNIT/ML [place] SOLN   Subcutaneous   Inject 20 Units into the skin 3 (three) times daily before meals. Inject 20 units before each meal - if blood sugars more than 200 add 8 units.         Marland Kitchen METOLAZONE 2.5 MG PO TABS      Take one tablet by mouth on Mon/Wed/Fri 30 min prior to Furosemide   30 tablet   3   . METOPROLOL SUCCINATE ER 50 MG PO TB24   Oral   Take 25-50 mg by mouth daily. 1/2 tab in the morning and 1 whole tab in the evening         . POTASSIUM CHLORIDE CRYS ER 20 MEQ PO TBCR      TAKE 2 TABLETS BY MOUTH DAILY   180 tablet   1   . RABEPRAZOLE SODIUM 20 MG PO TBEC   Oral   Take 1 tablet (20 mg total) by mouth daily.   90 tablet   3   . SIMVASTATIN 40 MG PO TABS   Oral   Take 1 tablet (40 mg total) by mouth at bedtime.   90 tablet   3   . TEMAZEPAM 30 MG PO CAPS   Oral   Take 30 mg by mouth at bedtime as needed. For sleep         . TIOTROPIUM BROMIDE MONOHYDRATE 18 MCG IN CAPS   Inhalation   Place 18 mcg into inhaler and inhale daily.         . TRAMADOL HCL 50 MG PO TABS   Oral   Take 1 tablet (50 mg total) by mouth every 6 (six) hours as needed for pain.   180 tablet   4   . WARFARIN SODIUM 5 MG PO TABS   Oral   Take 2.5 mg by mouth daily.            BP 129/73  Pulse 89  Temp 97.5 F (36.4 C) (Oral)  Resp 20  SpO2 90%  Physical Exam  Nursing note and vitals reviewed. Constitutional: He appears well-developed and well-nourished. No distress.  HENT:  Head: Normocephalic.  Neck:  JVD present.  Cardiovascular: Normal rate and regular rhythm.   Pulmonary/Chest: Tachypnea  noted. He has wheezes. He has rales.  Abdominal: Soft. There is no tenderness. There is no rebound.  Musculoskeletal: He exhibits edema.       Significant bilateral lower extremity edema with pitting up to the knees.  Neurological: He is alert.  Skin: Skin is warm.  Psychiatric: He has a normal mood and affect. His behavior is normal.    ED Course  Procedures  Results for orders placed during the hospital encounter of 03/24/12  CBC      Component Value Range   WBC 5.4  4.0 - 10.5 K/uL   RBC 4.57  4.22 - 5.81 MIL/uL   Hemoglobin 10.7 (*) 13.0 - 17.0 g/dL   HCT 16.1 (*) 09.6 - 04.5 %   MCV 76.8 (*) 78.0 - 100.0 fL   MCH 23.4 (*) 26.0 - 34.0 pg   MCHC 30.5  30.0 - 36.0 g/dL   RDW 40.9 (*) 81.1 - 91.4 %   Platelets 196  150 - 400 K/uL  BASIC METABOLIC PANEL      Component Value Range   Sodium 132 (*) 135 - 145 mEq/L   Potassium 3.6  3.5 - 5.1 mEq/L   Chloride 91 (*) 96 - 112 mEq/L   CO2 31  19 - 32 mEq/L   Glucose, Bld 112 (*) 70 - 99 mg/dL   BUN 54 (*) 6 - 23 mg/dL   Creatinine, Ser 7.82 (*) 0.50 - 1.35 mg/dL   Calcium 8.9  8.4 - 95.6 mg/dL   GFR calc non Af Amer 41 (*) >90 mL/min   GFR calc Af Amer 47 (*) >90 mL/min  PROTIME-INR      Component Value Range   Prothrombin Time 25.8 (*) 11.6 - 15.2 seconds   INR 2.50 (*) 0.00 - 1.49  PRO B NATRIURETIC PEPTIDE      Component Value Range   Pro B Natriuretic peptide (BNP) 8685.0 (*) 0 - 125 pg/mL  CK TOTAL AND CKMB      Component Value Range   Total CK 91  7 - 232 U/L   CK, MB 4.6 (*) 0.3 - 4.0 ng/mL   Relative Index RELATIVE INDEX IS INVALID  0.0 - 2.5  TROPONIN I      Component Value Range   Troponin I <0.30  <0.30 ng/mL  URINALYSIS, ROUTINE W REFLEX MICROSCOPIC      Component Value Range   Color, Urine YELLOW  YELLOW   APPearance CLOUDY (*) CLEAR   Specific Gravity, Urine 1.009  1.005 - 1.030   pH 6.0  5.0 - 8.0   Glucose, UA NEGATIVE  NEGATIVE mg/dL   Hgb urine dipstick TRACE (*) NEGATIVE   Bilirubin Urine  NEGATIVE  NEGATIVE   Ketones, ur NEGATIVE  NEGATIVE mg/dL   Protein, ur 30 (*) NEGATIVE mg/dL   Urobilinogen, UA 1.0  0.0 - 1.0 mg/dL   Nitrite NEGATIVE  NEGATIVE   Leukocytes, UA LARGE (*) NEGATIVE  URINE MICROSCOPIC-ADD ON      Component Value Range   WBC, UA TOO NUMEROUS TO COUNT  <3 WBC/hpf   RBC / HPF 0-2  <3 RBC/hpf   Bacteria, UA MANY (*) RARE        Dg Chest 2 View  02/26/2012  *RADIOLOGY REPORT*  Clinical Data: Worsening shortness of breath and lower extremity edema.  CHEST - 2 VIEW  Comparison: 11/15/2011  Findings: Moderate cardiomegaly and chronic pulmonary  vascular congestion are stable.  Increased small right pleural effusion and right lower lobe atelectasis demonstrated.  No evidence of pulmonary consolidation. Azygos fissure again noted.  Transvenous pacemaker remains in stable position.  IMPRESSION:  1.  Increased small right pleural effusion and right lower lobe atelectasis. 2.  Stable cardiomegaly and chronic pulmonary venous hypertension.   Original Report Authenticated By: Myles Rosenthal, M.D.      1. CHF exacerbation   2. UTI (lower urinary tract infection)       MDM  1:20AM patient seen and evaluated. Patient with tachypnea and mild respiratory distress. Patient has slight wheezing with crackles/rales on exam. Patient has significant bilateral lower extremity edema. Signs concerning for CHF exacerbation   Patient was seen and evaluated with attending physician. Will plan on admission given patient's increased respiratory symptoms and significant swelling.   Spoke with [facility] hospital[facility]. They will see patient and admit.     Angus Seller, [place] 02/26/12 [phone]

## 2012-02-26 NOTE — Plan of Care (Signed)
Problem: Phase I Progression Outcomes Goal: Dyspnea controlled at rest (HF) Outcome: Completed/Met Date Met:  02/26/12 45-50% per echo 04/27/11

## 2012-02-26 NOTE — Progress Notes (Signed)
ANTICOAGULATION CONSULT NOTE - Initial Consult  Pharmacy Consult for Warfarin Indication: atrial fibrillation  Allergies  Allergen Reactions  . Aspirin     REACTION: Swelling, trouble breathing  . Tetracycline Hcl     REACTION: Swelling, trouble breathing    Patient Measurements:   Wt=68 kg  Vital Signs: Temp: 97.8 F (36.6 C) (12/06 0227) Temp src: Oral (12/06 0227) BP: 135/74 mmHg (12/06 0227) Pulse Rate: 82  (12/06 0227)  Labs:  Basename 02/26/12 0105  HGB 10.7*  HCT 35.1*  PLT 196  APTT --  LABPROT 25.8*  INR 2.50*  HEPARINUNFRC --  CREATININE 1.69*  CKTOTAL 91  CKMB 4.6*  TROPONINI <0.30    The CrCl is unknown because both a height and weight (above a minimum accepted value) are required for this calculation.   Medical History: Past Medical History  Diagnosis Date  . ALCOHOL ABUSE, HX OF 04/30/2008  . 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 Atrial flutter 09/20/2006  . 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 Chronic systolic heart failure 09/20/2006    previous EF 25%;  Echocardiogram 12/10/10: Mild-moderate LVH, EF 50-55%, mild MR, moderate LAE, moderate RAE, mild RVE with moderately reduced RV SF, PASP 50, small pericardial effusion  . COPD 12/17/2006  . CORONARY ARTERY DISEASE 09/20/2006    s/p stent to OM;  catheterization 12/06: Mid to distal LAD 30%, D2 20%, proximal circumflex 30%, OM 2 stents patent, ostial RCA 50%, mid RCA 40%, distal RCA multiple 40%  . 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 DYSPEPSIA&OTHER [facility] DISORDERS FUNCTION STOMACH 04/04/2009  . HYPERTENSION 12/17/2006  . OBESITY 04/30/2008  . HLD (hyperlipidemia) 09/20/2006  . OTITIS EXTERNA, CHRONIC NEC 12/28/2006  . PERSONAL HX COLONIC POLYPS 05/02/2009  . PSORIASIS 04/30/2008  . CKD (chronic kidney disease) 04/30/2008    creatinine 2.2 range    Medications:  Scheduled:    .  budesonide-formoterol  2 puff Inhalation BID  . [COMPLETED] cefTRIAXone (ROCEPHIN)  IV  1 g Intravenous Once  . cefTRIAXone (ROCEPHIN)  IV  1 g Intravenous Q24H  . [COMPLETED] furosemide  80 mg Intravenous Once  . furosemide  80 mg Intravenous Q12H  . gabapentin  100 mg Oral TID  . insulin aspart  0-9 Units Subcutaneous TID WC  . insulin glargine  30 Units Subcutaneous QHS  . metolazone  2.5 mg Oral Daily  . metoprolol succinate  25 mg Oral Daily  . metoprolol succinate  50 mg Oral QHS  . pantoprazole  40 mg Oral Daily  . potassium chloride SA  20 mEq Oral BID  . simvastatin  40 mg Oral QHS  . sodium chloride  3 mL Intravenous Q12H  . sodium chloride  3 mL Intravenous Q12H  . tiotropium  18 mcg Inhalation Daily   Infusions:    Assessment: 66 admitted c/o SOB and LE edema on chronic coumadin for A-fib. Pt on 2.5mg  daily @ home. Goal of Therapy:  INR 2-3    Plan:   Daily PT/INR  Education  Susanne Greenhouse R 02/26/2012,5:49 AM

## 2012-02-26 NOTE — Plan of Care (Signed)
Problem: Phase I Progression Outcomes Goal: Initial discharge plan identified Outcome: Completed/Met Date Met:  02/26/12 Plan will be for patient to discharge back to home.

## 2012-02-26 NOTE — Progress Notes (Signed)
[facility] PROGRESS NOTE  [name] NWG:[id number] DOB: [birth year] DOA: 03/18/2012 PCP: Jon Boga, MD  Brief narrative: [name] is a 66 year old man with a past medical history of ischemic cardiomyopathy, coronary artery disease status post stenting, atrial fibrillation status post AV nodal ablation and pacemaker placement, on chronic Coumadin as well as diabetes, and COPD who presented to the hospital on 02/26/2012 with shortness of breath and worsening lower extremity edema.  Assessment/Plan: Principal Problem:  *Acute on chronic combined systolic and diastolic CHF (congestive heart failure)  Placed on Lasix 80 mg IV every 12 hours upon admission. Metolazone continued. Still fairly dyspneic. Will increase his Lasix to 80 mg IV every 6 hours (on 80 mg orally twice a day to 3 times a day at home)  Fluid balance negative. Active Problems:  Microcytic anemia  We'll check iron studies.  Hypokalemia  Placed on oral replacement therapy.  Skin wounds  The patient has excoriated skin from where he has scratched himself. We'll ask the wound care nurse to evaluate him. I do not see any psoriatic plaques. Benadryl cream when necessary for pruritus.  Lower extremity edema  Dopplers done. DVT ruled out.  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 Monitor for glucose control.  CORONARY ARTERY DISEASE  First troponin negative. No complaints of chest pain. On a statin and a beta blocker.  Atrial fibrillation  Status post AV nodal ablation. Has pacemaker. Rate controlled, on Coumadin.  COPD  Stable. Continue Symbicort and Spiriva.  UTI (lower urinary tract infection)  Placed on empiric Rocephin. Followup urine culture.  Stage III chronic kidney disease  Monitor creatinine with diuresis. Currently at usual baseline values.   Code Status: Full. Family Communication: None at bedside. Disposition Plan:  Home when stable.   Medical Consultants:  None.  Other Consultants:  None  Anti-infectives:  Rocephin 02/26/2012--->  HPI/Subjective: [name] is still short of breath, but reports that his breathing has improved since admission. No cough or fever. He is concerned about pruritic areas to his skin and reports that he has a history of psoriasis.  Objective: Filed Vitals:   02/26/12 0227 02/26/12 0555 02/26/12 1218 02/26/12 1305  BP: 135/74 138/73  131/76  Pulse: 82 82  91  Temp: 97.8 F (36.6 C) 97.4 F (36.3 C)  97.6 F (36.4 C)  TempSrc: Oral Oral  Oral  Resp: 20 20  22   Height:  5\' 5"  (1.651 m)    Weight:  72.2 kg (159 lb 2.8 oz)    SpO2: 100% 99% 98% 98%    Intake/Output Summary (Last 24 hours) at 02/26/12 1524 Last data filed at 02/26/12 1506  Gross per 24 hour  Intake    600 ml  Output   1750 ml  Net  -1150 ml    Exam: Gen:  NAD Cardiovascular:  RRR, No M/R/G Respiratory:  Lungs with bibasilar crackles Gastrointestinal:  Abdomen soft, NT/ND, + BS Extremities:  + edema Skin: Wounds on extremities, likely from where he scratched his skin.  Data Reviewed: Basic Metabolic Panel:  Lab 02/26/12 5784 02/26/12 0105  NA 135 132*  K 3.4* 3.6  CL 91* 91*  CO2 35* 31  GLUCOSE 117* 112*  BUN 53* 54*  CREATININE 1.69* 1.69*  CALCIUM 9.1 8.9  MG 1.9 --  PHOS -- --   GFR Estimated Creatinine Clearance: 37.4 ml/min (by C-G formula based on Cr of 1.69). Liver Function Tests:  Lab 02/26/12 0648  AST 19  ALT 11  ALKPHOS 84  BILITOT 0.9  PROT 8.3  ALBUMIN 2.8*   Coagulation profile  Lab 02/26/12 0105  INR 2.50*  PROTIME --    CBC:  Lab 02/26/12 0648 02/26/12 0105  WBC 5.6 5.4  NEUTROABS -- --  HGB 11.1* 10.7*  HCT 36.4* 35.1*  MCV 77.3* 76.8*  PLT 199 196   Cardiac Enzymes:  Lab 02/26/12 1235 02/26/12 0650 02/26/12 0105  CKTOTAL -- -- 91  CKMB -- -- 4.6*  CKMBINDEX -- -- --  TROPONINI <0.30 <0.30 <0.30   BNP (last 3  results)  Basename 02/26/12 0105 04/07/11 0934 03/21/11 1330  PROBNP 8685.0* 205.0* 5339.0*   Microbiology No results found for this or any previous visit (from the past 240 hour(s)).   Procedures and Diagnostic Studies:  Dg Chest 2 View 02/26/2012  *RADIOLOGY REPORT*  Clinical Data: Worsening shortness of breath and lower extremity edema.  CHEST - 2 VIEW  Comparison: 11/15/2011  Findings: Moderate cardiomegaly and chronic pulmonary vascular congestion are stable.  Increased small right pleural effusion and right lower lobe atelectasis demonstrated.  No evidence of pulmonary consolidation. Azygos fissure again noted.  Transvenous pacemaker remains in stable position.  IMPRESSION:  1.  Increased small right pleural effusion and right lower lobe atelectasis. 2.  Stable cardiomegaly and chronic pulmonary venous hypertension.   Original Report Authenticated By: Myles Rosenthal, M.D.     Bilateral lower extremity venous duplex 02/26/12. Visualized veins of bilateral lower extremities are negative for deep vein thrombosis. Unable to visualize the right posterior tibial and peroneal veins in their entirety, therefore cannot exclude deep vein thrombosis in non visualized segments. No evidence of Baker's cyst bilaterally.  Scheduled Meds:    . budesonide-formoterol  2 puff Inhalation BID  . [COMPLETED] cefTRIAXone (ROCEPHIN)  IV  1 g Intravenous Once  . cefTRIAXone (ROCEPHIN)  IV  1 g Intravenous Q24H  . [COMPLETED] furosemide  80 mg Intravenous Once  . furosemide  80 mg Intravenous Q6H  . gabapentin  100 mg Oral TID  . influenza  inactive virus vaccine  0.5 mL Intramuscular Tomorrow-1000  . insulin aspart  0-9 Units Subcutaneous TID WC  . insulin glargine  30 Units Subcutaneous QHS  . metolazone  2.5 mg Oral Daily  . metoprolol succinate  25 mg Oral Daily  . metoprolol succinate  50 mg Oral QHS  . pantoprazole  40 mg Oral Daily  . potassium chloride SA  20 mEq Oral TID  . simvastatin  40 mg Oral  QHS  . sodium chloride  3 mL Intravenous Q12H  . sodium chloride  3 mL Intravenous Q12H  . tiotropium  18 mcg Inhalation Daily  . warfarin  2.5 mg Oral ONCE-1800  . Warfarin - Pharmacist Dosing Inpatient   Does not apply q1800  . [DISCONTINUED] furosemide  80 mg Intravenous Q12H  . [DISCONTINUED] potassium chloride SA  20 mEq Oral BID   Continuous Infusions:   Time spent: 35 minutes.   LOS: 1 day   Treven Holtman  [facility] Pager [phone].  If 8PM-8AM, please contact night-coverage at [email or web address], password [facility] 02/26/2012, 3:24 PM

## 2012-02-26 NOTE — Care Management Note (Unsigned)
    Page 1 of 2   03/04/2012     4:41:32 PM   CARE MANAGEMENT NOTE 03/04/2012  Patient:  [name], [name]   Account Number:  [id number]  Date Initiated:  02/26/2012  Documentation initiated by:  Lanier Clam  Subjective/Objective Assessment:   ADMITTED W/SOB.CHF.     Action/Plan:   FROM HOME W/SPOUSE.HAS PCP,PHARMACY.HAS HOME 02-[facility].   Anticipated DC Date:  03/07/2012   Anticipated DC Plan:  HOME W HOME HEALTH SERVICES      DC Planning Services  CM consult      Choice offered to / List presented to:  C-1 Patient           Status of service:  In process, will continue to follow Medicare Important Message given?   (If response is "NO", the following Medicare IM given date fields will be blank) Date Medicare IM given:   Date Additional Medicare IM given:    Discharge Disposition:    Per UR Regulation:  Reviewed for med. necessity/level of care/duration of stay  If discussed at Long Length of Stay Meetings, dates discussed:    Comments:  03/04/12 Tamekia Rotter RN,BSN NCM [phone] IMPROVING,ALBUMIN IV.[facility] FOLLOWING FOR HHC.RECOMMEND HHRN/PT/OT.  03/01/12 Denetta Fei RN,BSN NCM [phone] S/P THORACENTESIS.D/C PLAN HOME W/HH.[facility] ALREADY FOLLOWING.  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 PROVIDED W/PCP LISTING/MEALS ON WHEELS INFO/SENIOR RESOURCE INFO/$4 [facility] MED LIST.DOES NOT QUALIFY FOR INDIGENT FUNDS SINCE HAS UHC-MEDICARE COMPLETE W/PRESCRIPTION COVERAGE.RECOMMEND HHRN IF MD AGREE PLEASE ORDER.

## 2012-02-27 LAB — BASIC METABOLIC PANEL
BUN: 51 mg/dL — ABNORMAL HIGH (ref 6–23)
Calcium: 8.9 mg/dL (ref 8.4–10.5)
Creatinine, Ser: 1.95 mg/dL — ABNORMAL HIGH (ref 0.50–1.35)
GFR calc non Af Amer: 34 mL/min — ABNORMAL LOW (ref >90)
Glucose, Bld: 87 mg/dL (ref 70–99)

## 2012-02-27 LAB — GLUCOSE, CAPILLARY: Glucose-Capillary: 90 mg/dL (ref 70–99)

## 2012-02-27 MED ORDER — WARFARIN SODIUM 2.5 MG PO TABS
2.5000 mg | ORAL_TABLET | Freq: Once | ORAL | Status: AC
  Administered 2012-02-27: 2.5 mg via ORAL
  Filled 2012-02-27: qty 1

## 2012-02-27 MED ORDER — POTASSIUM CHLORIDE CRYS ER 20 MEQ PO TBCR
20.0000 meq | EXTENDED_RELEASE_TABLET | Freq: Two times a day (BID) | ORAL | Status: DC
  Administered 2012-02-27 – 2012-02-28 (×2): 20 meq via ORAL
  Filled 2012-02-27 (×3): qty 1

## 2012-02-27 MED ORDER — FUROSEMIDE 10 MG/ML IJ SOLN
80.0000 mg | Freq: Two times a day (BID) | INTRAMUSCULAR | Status: DC
  Administered 2012-02-27 – 2012-02-28 (×2): 80 mg via INTRAVENOUS
  Filled 2012-02-27 (×4): qty 8

## 2012-02-27 MED ORDER — INSULIN GLARGINE 100 UNIT/ML ~~LOC~~ SOLN
25.0000 [IU] | Freq: Every day | SUBCUTANEOUS | Status: DC
  Administered 2012-02-27: 25 [IU] via SUBCUTANEOUS

## 2012-02-27 NOTE — Progress Notes (Signed)
ANTICOAGULATION CONSULT NOTE - Follow Up Consult  Pharmacy Consult for Coumadin Indication: atrial fibrillation  Allergies  Allergen Reactions  . Aspirin     REACTION: Swelling, trouble breathing  . Tetracycline Hcl     REACTION: Swelling, trouble breathing    Patient Measurements: Height: 5\' 5"  (165.1 cm) Weight: 162 lb 7.7 oz (73.7 kg) IBW/kg (Calculated) : 61.5   Vital Signs: Temp: 98.9 F (37.2 C) (12/07 0527) Temp src: Oral (12/07 0527) BP: 116/72 mmHg (12/07 0527) Pulse Rate: 80  (12/07 0527)  Labs:  Alvira Philips 02/27/12 0612 02/26/12 1928 02/26/12 1235 02/26/12 0650 02/26/12 0648 02/26/12 0105  HGB -- -- -- -- 11.1* 10.7*  HCT -- -- -- -- 36.4* 35.1*  PLT -- -- -- -- 199 196  APTT -- -- -- -- -- --  LABPROT 27.7* -- -- -- -- 25.8*  INR 2.75* -- -- -- -- 2.50*  HEPARINUNFRC -- -- -- -- -- --  CREATININE 1.95* -- -- -- 1.69* 1.69*  CKTOTAL -- -- -- -- -- 91  CKMB -- -- -- -- -- 4.6*  TROPONINI -- <0.30 <0.30 <0.30 -- --    Estimated Creatinine Clearance: 32.4 ml/min (by C-G formula based on Cr of 1.95).   Medications:  Scheduled:    . budesonide-formoterol  2 puff Inhalation BID  . [COMPLETED] calcium carbonate  1 tablet Oral Once  . cefTRIAXone (ROCEPHIN)  IV  1 g Intravenous Q24H  . furosemide  80 mg Intravenous Q12H  . gabapentin  100 mg Oral TID  . influenza  inactive virus vaccine  0.5 mL Intramuscular Tomorrow-1000  . insulin aspart  0-9 Units Subcutaneous TID WC  . insulin glargine  30 Units Subcutaneous QHS  . metolazone  2.5 mg Oral Daily  . metoprolol succinate  25 mg Oral Daily  . metoprolol succinate  50 mg Oral QHS  . pantoprazole  40 mg Oral Daily  . potassium chloride SA  20 mEq Oral QID  . simvastatin  40 mg Oral QHS  . sodium chloride  3 mL Intravenous Q12H  . sodium chloride  3 mL Intravenous Q12H  . tiotropium  18 mcg Inhalation Daily  . [COMPLETED] warfarin  2.5 mg Oral ONCE-1800  . Warfarin - Pharmacist Dosing Inpatient   Does  not apply q1800  . [DISCONTINUED] furosemide  80 mg Intravenous Q12H  . [DISCONTINUED] furosemide  80 mg Intravenous Q6H  . [DISCONTINUED] potassium chloride SA  20 mEq Oral TID    Assessment: 66 admitted c/o SOB and LE edema on chronic coumadin for A-fib, has pacemaker.  Home dose reported as 2.5mg  daily  INR therapeutic  No bleeding per notes  Goal of Therapy:  INR 2-3   Plan:   Coumadin 2.5mg  today per home dose  Daily PT/INR  Loralee Pacas, PharmD, BCPS Pager: [phone] 02/27/2012,8:47 AM

## 2012-02-27 NOTE — Plan of Care (Signed)
Problem: Phase I Progression Outcomes Goal: EF % per last Echo/documented,Core Reminder form on chart 45-50

## 2012-02-27 NOTE — Progress Notes (Signed)
[facility] PROGRESS NOTE  [name] YQM:[id number] DOB: 1945-09-13 DOA: 03/04/2012 PCP: [name], MD  Brief narrative: [name] is a 66 year old man with a past medical history of ischemic cardiomyopathy, coronary artery disease status post stenting, atrial fibrillation status post AV nodal ablation and pacemaker placement, on chronic Coumadin as well as diabetes, and COPD who presented to the hospital on 02/26/2012 with shortness of breath and worsening lower extremity edema.  Assessment/Plan: Principal Problem:  *Acute on chronic combined systolic and diastolic CHF (congestive heart failure)  EF 45-50% with grade II diastolic dysfunction on 2 D Echo done 04/27/11.  Not on ACE or ARB.  Cannot add with rising creatinine, but will need to consider once creatinine stable.  Placed on Lasix 80 mg IV every 12 hours upon admission, which was increased to Q 6 hours 02/27/12. Metolazone continued. SOB improved today, given slight bump in creatinine, will decrease lasix back to 80 mg IV Q 12 hours.    Fluid balance remains negative. Active Problems:  Microcytic anemia  Ferritin normal but iron low with low saturation ratios, consistent with AOCD.  Hypokalemia  Placed on oral replacement therapy.  Skin wounds  The patient has excoriated skin from where he has scratched himself. Wound care nurse to consult. I do not see any psoriatic plaques. Benadryl cream when necessary for pruritus.  Lower extremity edema  Dopplers done. DVT ruled out.  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 CBG 63-144.  Decrease lantus to 25 units.  CORONARY ARTERY DISEASE  Troponins negative x 4. No complaints of chest pain. On a statin and a beta blocker.  Atrial fibrillation  Status post AV nodal ablation. Has pacemaker. Rate controlled, on Coumadin.  COPD  Stable. Continue Symbicort and Spiriva.  UTI (lower urinary tract  infection)  Placed on empiric Rocephin. Followup urine culture.  Stage III chronic kidney disease  Monitor creatinine with diuresis. Currently at usual baseline values.   Code Status: Full. Family Communication: None at bedside. Disposition Plan: Home when stable.   Medical Consultants:  None.  Other Consultants:  Wound Care RN  Anti-infectives:  Rocephin 02/26/2012--->  HPI/Subjective: [name] is having less labored breathing, but still gets SOB at times.  Appetite is fair.  No cough.  Objective: Filed Vitals:   02/26/12 2043 02/26/12 2103 02/27/12 0527 02/27/12 0833  BP: 124/75  116/72   Pulse: 83  80   Temp: 97.8 F (36.6 C)  98.9 F (37.2 C)   TempSrc: Oral  Oral   Resp: 19  18   Height:      Weight:   73.7 kg (162 lb 7.7 oz)   SpO2: 98% 98% 100% 99%    Intake/Output Summary (Last 24 hours) at 02/27/12 1409 Last data filed at 02/27/12 0700  Gross per 24 hour  Intake    650 ml  Output   1825 ml  Net  -1175 ml    Exam: Gen:  NAD Cardiovascular:  RRR, No M/R/G Respiratory:  Lungs with bibasilar crackles Gastrointestinal:  Abdomen soft, NT/ND, + BS Extremities:  + edema Skin: Wounds on extremities, likely from where he scratched his skin.  Data Reviewed: Basic Metabolic Panel:  Lab 02/27/12 5284 02/26/12 0648 02/26/12 0105  NA 135 135 132*  K 4.5 3.4* --  CL 92* 91* 91*  CO2 36* 35* 31  GLUCOSE 87 117* 112*  BUN 51* 53* 54*  CREATININE 1.95* 1.69*  1.69*  CALCIUM 8.9 9.1 8.9  MG -- 1.9 --  PHOS -- -- --   GFR Estimated Creatinine Clearance: 32.4 ml/min (by C-G formula based on Cr of 1.95). Liver Function Tests:  Lab 02/26/12 0648  AST 19  ALT 11  ALKPHOS 84  BILITOT 0.9  PROT 8.3  ALBUMIN 2.8*   Coagulation profile  Lab 02/27/12 0612 02/26/12 0105  INR 2.75* 2.50*  PROTIME -- --    CBC:  Lab 02/26/12 0648 02/26/12 0105  WBC 5.6 5.4  NEUTROABS -- --  HGB 11.1* 10.7*  HCT 36.4* 35.1*  MCV 77.3* 76.8*  PLT 199 196    Cardiac Enzymes:  Lab 02/26/12 1928 02/26/12 1235 02/26/12 0650 02/26/12 0105  CKTOTAL -- -- -- 91  CKMB -- -- -- 4.6*  CKMBINDEX -- -- -- --  TROPONINI <0.30 <0.30 <0.30 <0.30   CBG (last 3)   Basename 02/27/12 1133 02/27/12 0904 02/26/12 2151  GLUCAP 90 63* 144*     BNP (last 3 results)  Basename 02/26/12 0105 04/07/11 0934 03/21/11 1330  PROBNP 8685.0* 205.0* 5339.0*   Microbiology Recent Results (from the past 240 hour(s))  URINE CULTURE     Status: Normal (Preliminary result)   Collection Time   02/26/12  1:28 AM      Component Value Range Status Comment   Specimen Description URINE, RANDOM   Final    Special Requests Normal   Final    Culture  Setup Time 02/26/2012 04:22   Final    Colony Count PENDING   Incomplete    Culture Culture reincubated for better growth   Final    Report Status PENDING   Incomplete      Procedures and Diagnostic Studies:  Dg Chest 2 View 02/26/2012  *RADIOLOGY REPORT*  Clinical Data: Worsening shortness of breath and lower extremity edema.  CHEST - 2 VIEW  Comparison: 11/15/2011  Findings: Moderate cardiomegaly and chronic pulmonary vascular congestion are stable.  Increased small right pleural effusion and right lower lobe atelectasis demonstrated.  No evidence of pulmonary consolidation. Azygos fissure again noted.  Transvenous pacemaker remains in stable position.  IMPRESSION:  1.  Increased small right pleural effusion and right lower lobe atelectasis. 2.  Stable cardiomegaly and chronic pulmonary venous hypertension.   Original Report Authenticated By: Myles Rosenthal, M.D.     Bilateral lower extremity venous duplex 02/26/12. Visualized veins of bilateral lower extremities are negative for deep vein thrombosis. Unable to visualize the right posterior tibial and peroneal veins in their entirety, therefore cannot exclude deep vein thrombosis in non visualized segments. No evidence of Baker's cyst bilaterally.  Scheduled Meds:    .  budesonide-formoterol  2 puff Inhalation BID  . [COMPLETED] calcium carbonate  1 tablet Oral Once  . cefTRIAXone (ROCEPHIN)  IV  1 g Intravenous Q24H  . furosemide  80 mg Intravenous Q12H  . gabapentin  100 mg Oral TID  . [COMPLETED] influenza  inactive virus vaccine  0.5 mL Intramuscular Tomorrow-1000  . insulin aspart  0-9 Units Subcutaneous TID WC  . insulin glargine  30 Units Subcutaneous QHS  . metolazone  2.5 mg Oral Daily  . metoprolol succinate  25 mg Oral Daily  . metoprolol succinate  50 mg Oral QHS  . pantoprazole  40 mg Oral Daily  . potassium chloride SA  20 mEq Oral QID  . simvastatin  40 mg Oral QHS  . sodium chloride  3 mL Intravenous Q12H  . sodium chloride  3  mL Intravenous Q12H  . tiotropium  18 mcg Inhalation Daily  . [COMPLETED] warfarin  2.5 mg Oral ONCE-1800  . warfarin  2.5 mg Oral ONCE-1800  . Warfarin - Pharmacist Dosing Inpatient   Does not apply q1800  . [DISCONTINUED] furosemide  80 mg Intravenous Q6H  . [DISCONTINUED] potassium chloride SA  20 mEq Oral TID   Continuous Infusions:   Time spent: 35 minutes.   LOS: 2 days   Theadora Noyes  [facility] Pager [phone].  If 8PM-8AM, please contact night-coverage at [email or web address], password [facility] 02/27/2012, 2:09 PM

## 2012-02-27 NOTE — Plan of Care (Signed)
Problem: Phase I Progression Outcomes Goal: EF % per last Echo/documented,Core Reminder form on chart Outcome: Completed/Met Date Met:  02/27/12 45-50%

## 2012-02-28 LAB — BASIC METABOLIC PANEL
CO2: 37 mEq/L — ABNORMAL HIGH (ref 19–32)
GFR calc non Af Amer: 31 mL/min — ABNORMAL LOW (ref >90)
Glucose, Bld: 104 mg/dL — ABNORMAL HIGH (ref 70–99)
Potassium: 4.9 mEq/L (ref 3.5–5.1)
Sodium: 135 mEq/L (ref 135–145)

## 2012-02-28 LAB — GLUCOSE, CAPILLARY
Glucose-Capillary: 47 mg/dL — ABNORMAL LOW (ref 70–99)
Glucose-Capillary: 80 mg/dL (ref 70–99)
Glucose-Capillary: 110 mg/dL — ABNORMAL HIGH (ref 70–99)
Glucose-Capillary: 72 mg/dL (ref 70–99)

## 2012-02-28 LAB — PROTIME-INR: Prothrombin Time: 25 seconds — ABNORMAL HIGH (ref 11.6–15.2)

## 2012-02-28 MED ORDER — POTASSIUM CHLORIDE CRYS ER 20 MEQ PO TBCR
20.0000 meq | EXTENDED_RELEASE_TABLET | Freq: Every day | ORAL | Status: DC
  Administered 2012-02-29: 20 meq via ORAL
  Filled 2012-02-28: qty 1

## 2012-02-28 MED ORDER — FUROSEMIDE 10 MG/ML IJ SOLN
80.0000 mg | Freq: Every day | INTRAMUSCULAR | Status: DC
  Administered 2012-02-29 – 2012-03-04 (×5): 80 mg via INTRAVENOUS
  Filled 2012-02-28 (×5): qty 8

## 2012-02-28 MED ORDER — WARFARIN SODIUM 2.5 MG PO TABS
2.5000 mg | ORAL_TABLET | Freq: Once | ORAL | Status: AC
  Administered 2012-02-28: 2.5 mg via ORAL
  Filled 2012-02-28: qty 1

## 2012-02-28 MED ORDER — HYDROCERIN EX CREA
TOPICAL_CREAM | Freq: Two times a day (BID) | CUTANEOUS | Status: AC
  Administered 2012-02-28 – 2012-03-12 (×26): via TOPICAL
  Filled 2012-02-28 (×3): qty 113

## 2012-02-28 NOTE — Progress Notes (Signed)
ANTICOAGULATION CONSULT NOTE - Follow Up Consult  Pharmacy Consult for Coumadin Indication: atrial fibrillation  Allergies  Allergen Reactions  . Aspirin     REACTION: Swelling, trouble breathing  . Tetracycline Hcl     REACTION: Swelling, trouble breathing    Patient Measurements: Height: 5\' 5"  (165.1 cm) Weight: 162 lb 7.7 oz (73.7 kg) IBW/kg (Calculated) : 61.5   Vital Signs: Temp: 98.3 F (36.8 C) (12/08 0635) Temp src: Oral (12/08 0635) BP: 104/64 mmHg (12/08 0635) Pulse Rate: 80  (12/08 0635)  Labs:  [name] 02/28/12 0520 02/27/12 0612 02/26/12 1928 02/26/12 1235 02/26/12 0650 02/26/12 0648 02/26/12 0105  HGB -- -- -- -- -- 11.1* 10.7*  HCT -- -- -- -- -- 36.4* 35.1*  PLT -- -- -- -- -- 199 196  APTT -- -- -- -- -- -- --  LABPROT 25.0* 27.7* -- -- -- -- 25.8*  INR 2.39* 2.75* -- -- -- -- 2.50*  HEPARINUNFRC -- -- -- -- -- -- --  CREATININE -- 1.95* -- -- -- 1.69* 1.69*  CKTOTAL -- -- -- -- -- -- 91  CKMB -- -- -- -- -- -- 4.6*  TROPONINI -- -- <0.30 <0.30 <0.30 -- --    Estimated Creatinine Clearance: 32.4 ml/min (by C-G formula based on Cr of 1.95).   Medications:  Scheduled:     . budesonide-formoterol  2 puff Inhalation BID  . cefTRIAXone (ROCEPHIN)  IV  1 g Intravenous Q24H  . furosemide  80 mg Intravenous Q12H  . gabapentin  100 mg Oral TID  . [COMPLETED] influenza  inactive virus vaccine  0.5 mL Intramuscular Tomorrow-1000  . insulin aspart  0-9 Units Subcutaneous TID WC  . metolazone  2.5 mg Oral Daily  . metoprolol succinate  25 mg Oral Daily  . metoprolol succinate  50 mg Oral QHS  . pantoprazole  40 mg Oral Daily  . potassium chloride SA  20 mEq Oral BID  . simvastatin  40 mg Oral QHS  . sodium chloride  3 mL Intravenous Q12H  . sodium chloride  3 mL Intravenous Q12H  . tiotropium  18 mcg Inhalation Daily  . [COMPLETED] warfarin  2.5 mg Oral ONCE-1800  . Warfarin - Pharmacist Dosing Inpatient   Does not apply q1800  . [DISCONTINUED]  insulin glargine  25 Units Subcutaneous QHS  . [DISCONTINUED] insulin glargine  30 Units Subcutaneous QHS  . [DISCONTINUED] potassium chloride SA  20 mEq Oral QID    Assessment: 66 admitted c/o SOB and LE edema on chronic coumadin for A-fib, has pacemaker.  Home dose reported as 2.5mg  daily  INR therapeutic  No bleeding per notes  Goal of Therapy:  INR 2-3   Plan:   Coumadin 2.5mg  today per home dose  Daily PT/INR  Loralee Pacas, PharmD, BCPS Pager: [phone] 02/28/2012,9:17 AM

## 2012-02-28 NOTE — Progress Notes (Signed)
[facility] PROGRESS NOTE  [name] NWG:[id number] DOB: [birth year] DOA: 03/14/2012 PCP: [name], MD  Brief narrative: [name] is a 66 year old man with a past medical history of ischemic cardiomyopathy, coronary artery disease status post stenting, atrial fibrillation status post AV nodal ablation and pacemaker placement, on chronic Coumadin as well as diabetes, and COPD who presented to the hospital on 02/26/2012 with shortness of breath and worsening lower extremity edema.  Assessment/Plan: Principal Problem:  *Acute on chronic combined systolic and diastolic CHF (congestive heart failure)  EF 45-50% with grade II diastolic dysfunction on 2 D Echo done 04/27/11.  Not on ACE or ARB.  Cannot add with rising creatinine, but will need to consider once creatinine stable.  Continue Lasix, but reduce to once daily given ongoing rise in creatinine. Continue Metolazone.    Fluid balance remains negative. Active Problems:  Microcytic anemia  Ferritin normal but iron low with low saturation ratios, consistent with AOCD.  Hypokalemia  Placed on oral replacement therapy.  Skin wounds  The patient has excoriated skin from where he has scratched himself. Wound care nurse to consult. I do not see any psoriatic plaques. Benadryl cream when necessary for pruritus.  Lower extremity edema  Dopplers done. DVT ruled out.  DM w/o Complication Type II  On 25 units of Lantus daily along with insulin sensitive sliding scale. CBG 47-89.  Stop Lantus for now.  CORONARY ARTERY DISEASE  Troponins negative x 4. No complaints of chest pain. On a statin and a beta blocker.  Atrial fibrillation  Status post AV nodal ablation. Has pacemaker. Rate controlled, on Coumadin.  COPD  Stable. Continue Symbicort and Spiriva.  UTI (lower urinary tract infection)  Placed on empiric Rocephin. Preliminary culture growing gram-negative rods. Followup final urine culture results.  Stage III  chronic kidney disease  Monitor creatinine with diuresis. Creatinine slightly above usual baseline values.   Code Status: Full. Family Communication: None at bedside. Disposition Plan: Home when stable.   Medical Consultants:  None.  Other Consultants:  Wound Care RN  Anti-infectives:  Rocephin 02/26/2012--->  HPI/Subjective: [name] is having less labored breathing, but still gets SOB at times.  No chest pain. Appetite fair.  Objective: Filed Vitals:   02/27/12 2155 02/28/12 0635 02/28/12 0959 02/28/12 1327  BP: 119/60 104/64 110/61 97/75  Pulse: 74 80 85 58  Temp: 98.2 F (36.8 C) 98.3 F (36.8 C)  97.6 F (36.4 C)  TempSrc: Oral Oral  Oral  Resp: 18 18  17   Height:      Weight:      SpO2: 98% 99%  98%    Intake/Output Summary (Last 24 hours) at 02/28/12 1415 Last data filed at 02/27/12 2155  Gross per 24 hour  Intake    240 ml  Output   1050 ml  Net   -810 ml    Exam: Gen:  NAD Cardiovascular:  RRR, No M/R/G Respiratory:  Lungs with bibasilar crackles, steadily improving Gastrointestinal:  Abdomen soft, NT/ND, + BS Extremities:  + edema Skin: Wounds on extremities, likely from where he scratched his skin.  Data Reviewed: Basic Metabolic Panel:  Lab 02/28/12 5784 02/27/12 0612 02/26/12 0648 02/26/12 0105  NA 135 135 135 132*  K 4.9 4.5 -- --  CL 91* 92* 91* 91*  CO2 37* 36* 35* 31  GLUCOSE 104* 87 117* 112*  BUN 63* 51* 53* 54*  CREATININE 2.12* 1.95* 1.69* 1.69*  CALCIUM 8.8 8.9 9.1 8.9  MG -- --  1.9 --  PHOS -- -- -- --   GFR Estimated Creatinine Clearance: 29.8 ml/min (by C-G formula based on Cr of 2.12). Liver Function Tests:  Lab 02/26/12 0648  AST 19  ALT 11  ALKPHOS 84  BILITOT 0.9  PROT 8.3  ALBUMIN 2.8*   Coagulation profile  Lab 02/28/12 0520 02/27/12 0612 02/26/12 0105  INR 2.39* 2.75* 2.50*  PROTIME -- -- --    CBC:  Lab 02/26/12 0648 02/26/12 0105  WBC 5.6 5.4  NEUTROABS -- --  HGB 11.1* 10.7*  HCT 36.4*  35.1*  MCV 77.3* 76.8*  PLT 199 196   Cardiac Enzymes:  Lab 02/26/12 1928 02/26/12 1235 02/26/12 0650 02/26/12 0105  CKTOTAL -- -- -- 91  CKMB -- -- -- 4.6*  CKMBINDEX -- -- -- --  TROPONINI <0.30 <0.30 <0.30 <0.30   CBG (last 3)   Basename 02/28/12 1201 02/28/12 0825 02/28/12 0758  GLUCAP 110* 80 51*     BNP (last 3 results)  Basename 02/26/12 0105 04/07/11 0934 03/21/11 1330  PROBNP 8685.0* 205.0* 5339.0*   Microbiology Recent Results (from the past 240 hour(s))  URINE CULTURE     Status: Normal (Preliminary result)   Collection Time   02/26/12  1:28 AM      Component Value Range Status Comment   Specimen Description URINE, RANDOM   Final    Special Requests Normal   Final    Culture  Setup Time 02/26/2012 04:22   Final    Colony Count >=100,000 COLONIES/ML   Final    Culture GRAM NEGATIVE RODS   Final    Report Status PENDING   Incomplete      Procedures and Diagnostic Studies:  Dg Chest 2 View 02/26/2012  *RADIOLOGY REPORT*  Clinical Data: Worsening shortness of breath and lower extremity edema.  CHEST - 2 VIEW  Comparison: 11/15/2011  Findings: Moderate cardiomegaly and chronic pulmonary vascular congestion are stable.  Increased small right pleural effusion and right lower lobe atelectasis demonstrated.  No evidence of pulmonary consolidation. Azygos fissure again noted.  Transvenous pacemaker remains in stable position.  IMPRESSION:  1.  Increased small right pleural effusion and right lower lobe atelectasis. 2.  Stable cardiomegaly and chronic pulmonary venous hypertension.   Original Report Authenticated By: Myles Rosenthal, M.D.     Bilateral lower extremity venous duplex 02/26/12. Visualized veins of bilateral lower extremities are negative for deep vein thrombosis. Unable to visualize the right posterior tibial and peroneal veins in their entirety, therefore cannot exclude deep vein thrombosis in non visualized segments. No evidence of Baker's cyst  bilaterally.  Scheduled Meds:    . budesonide-formoterol  2 puff Inhalation BID  . cefTRIAXone (ROCEPHIN)  IV  1 g Intravenous Q24H  . furosemide  80 mg Intravenous Daily  . gabapentin  100 mg Oral TID  . hydrocerin   Topical BID  . insulin aspart  0-9 Units Subcutaneous TID WC  . metolazone  2.5 mg Oral Daily  . metoprolol succinate  25 mg Oral Daily  . metoprolol succinate  50 mg Oral QHS  . pantoprazole  40 mg Oral Daily  . potassium chloride SA  20 mEq Oral Daily  . simvastatin  40 mg Oral QHS  . sodium chloride  3 mL Intravenous Q12H  . sodium chloride  3 mL Intravenous Q12H  . tiotropium  18 mcg Inhalation Daily  . [COMPLETED] warfarin  2.5 mg Oral ONCE-1800  . warfarin  2.5 mg Oral ONCE-1800  .  Warfarin - Pharmacist Dosing Inpatient   Does not apply q1800  . [DISCONTINUED] furosemide  80 mg Intravenous Q12H  . [DISCONTINUED] insulin glargine  25 Units Subcutaneous QHS  . [DISCONTINUED] insulin glargine  30 Units Subcutaneous QHS  . [DISCONTINUED] potassium chloride SA  20 mEq Oral QID  . [DISCONTINUED] potassium chloride SA  20 mEq Oral BID   Continuous Infusions:   Time spent: 25 minutes.   LOS: 3 days   Denitra Donaghey  [facility] Pager [phone].  If 8PM-8AM, please contact night-coverage at [email or web address], password [facility] 02/28/2012, 2:15 PM

## 2012-02-28 NOTE — Progress Notes (Signed)
Hypoglycemic Event  CBG: 47  Treatment: 15 GM carbohydrate snack  Symptoms: None  Follow-up CBG: Time:0758 CBG Result:51  Possible Reasons for Event: Unknown  Comments/MD notified:Dr. Rama    [name]  Remember to initiate Hypoglycemia Order Set & complete

## 2012-02-28 NOTE — Consult Note (Signed)
WOC consult Note Reason for Consult:Per Dr. Salena Saner. Rama's request for assessment and suggestions for care of skin lesions that patient scratches.  Patient states that he has a history of psoriasis and saw a dermatologist for years, but can no longer afford those visits or the subsequent creams that kept his skin in better control. Wound type:traumatic (from scratching) Pressure Ulcer POA: No Measurement:numerous areas on bilateral upper and lower extremities and upper back. Most are scabbed, some are closed (reepithelialized).  None are actively bleeding or open.  Patient has a bloody nose at this time due to "cleaning out his nose" after the drying effect of the oxygen.  RN aware. Wound bed: As described aboveDrainage (amount, consistency, odor)  Periwound:intact, dry with linear excoriations evident Dressing procedure/placement/frequency: Noted is Dr. Laurence Aly order for topical benadryl cream for urticaria.  I will provide Eucerin Cream for maintenance moisturizing and ask that nursing apply to affected areas twice daily. Elevate LE's on pillows while in bed (and use foot rest while in chair) as able. I will not follow.  Please re-consult if needed. Thanks, Ladona Mow, MSN, RN, [facility], CWOCN [phone])

## 2012-02-28 NOTE — Progress Notes (Signed)
Hypoglycemic Event  CBG: 51  Treatment: 15 GM carbohydrate snack  Symptoms: None  Follow-up CBG: [id number] CBG Result:80  Possible Reasons for Event: Unknown  Comments/MD notified: Dr. Zigmund Daniel, Jose Persia  Remember to initiate Hypoglycemia Order Set & complete

## 2012-02-29 ENCOUNTER — Inpatient Hospital Stay (HOSPITAL_COMMUNITY): Payer: Medicare Other

## 2012-02-29 DIAGNOSIS — I5043 Acute on chronic combined systolic (congestive) and diastolic (congestive) heart failure: Secondary | ICD-10-CM

## 2012-02-29 DIAGNOSIS — E875 Hyperkalemia: Secondary | ICD-10-CM | POA: Diagnosis present

## 2012-02-29 DIAGNOSIS — R339 Retention of urine, unspecified: Secondary | ICD-10-CM | POA: Diagnosis present

## 2012-02-29 DIAGNOSIS — J90 Pleural effusion, not elsewhere classified: Secondary | ICD-10-CM | POA: Diagnosis present

## 2012-02-29 DIAGNOSIS — J9620 Acute and chronic respiratory failure, unspecified whether with hypoxia or hypercapnia: Secondary | ICD-10-CM

## 2012-02-29 LAB — BASIC METABOLIC PANEL
BUN: 69 mg/dL — ABNORMAL HIGH (ref 6–23)
CO2: 35 mEq/L — ABNORMAL HIGH (ref 19–32)
Chloride: 91 mEq/L — ABNORMAL LOW (ref 96–112)
Creatinine, Ser: 2.39 mg/dL — ABNORMAL HIGH (ref 0.50–1.35)
GFR calc Af Amer: 31 mL/min — ABNORMAL LOW (ref >90)
Potassium: 5.6 mEq/L — ABNORMAL HIGH (ref 3.5–5.1)

## 2012-02-29 LAB — GLUCOSE, CAPILLARY: Glucose-Capillary: 147 mg/dL — ABNORMAL HIGH (ref 70–99)

## 2012-02-29 LAB — PROTIME-INR
INR: 2.48 — ABNORMAL HIGH (ref 0.00–1.49)
Prothrombin Time: 25.7 seconds — ABNORMAL HIGH (ref 11.6–15.2)

## 2012-02-29 LAB — URINE CULTURE: Colony Count: >=100000

## 2012-02-29 LAB — D-DIMER, QUANTITATIVE: D-Dimer, Quant: 0.91 ug/mL-FEU — ABNORMAL HIGH (ref 0.00–0.48)

## 2012-02-29 MED ORDER — SODIUM POLYSTYRENE SULFONATE 15 GM/60ML PO SUSP
15.0000 g | Freq: Once | ORAL | Status: AC
  Administered 2012-02-29: 15 g via ORAL
  Filled 2012-02-29: qty 60

## 2012-02-29 MED ORDER — WARFARIN SODIUM 2.5 MG PO TABS
2.5000 mg | ORAL_TABLET | Freq: Once | ORAL | Status: AC
  Administered 2012-02-29: 2.5 mg via ORAL
  Filled 2012-02-29: qty 1

## 2012-02-29 MED ORDER — CIPROFLOXACIN HCL 250 MG PO TABS: 250.0000 mg | ORAL_TABLET | Freq: Two times a day (BID) | ORAL | Status: DC

## 2012-02-29 MED ORDER — TAMSULOSIN HCL 0.4 MG PO CAPS
0.4000 mg | ORAL_CAPSULE | Freq: Every day | ORAL | Status: DC
  Administered 2012-02-29 – 2012-03-28 (×26): 0.4 mg via ORAL
  Filled 2012-02-29 (×32): qty 1

## 2012-02-29 NOTE — Consult Note (Signed)
Cardiology Consult Note   Patient ID: [name] MRN: [id number], DOB/AGE: [birth year]   Admit date: 02/23/2012 Date of Consult: 02/29/2012  Primary Physician: [name], MD Primary Cardiologist: Lewayne Bunting, MD  Reason for consult: evaluation/management of CHF  HPI: [name] is a 66 yo male with a PMHx s/f chronic combined CHF d/t ICM (previous EF 25%, now 45-50%), persistent atrial fibrillation s/p AVN ablation and BIV pacer (on Coumadin), CAD (s/p PCI to OM), DM, CKD (stage III) and COPD (O2 dependent) with chronic hypoxic respiratory failure who was admitted to [facility] on 02/26/12 with acute on chronic combined CHF.   02/2005 cath: Mid to distal LAD 30%, D2 20%, proximal circumflex 30%, OM 2 stents patent, ostial RCA 50%, mid RCA 40%, distal RCA multiple 40%. Quit drinking alcohol 6 years ago. Quit smoking 6 years ago.  02/2011 PFTs: FVC 1.64 (42% pred), FEV1 0.97 (36% pred), FEV1/FEV 59%, FEF 25-75 % DLCO 47%  04/27/11 ECHO EF45% to 50%. Diffuse hypokinesis. Grade 2 diastolic dysfunction   He followed up with the HF clinic in 09/2011. He had been instructed to take metolazine MWF prior to Lasix and PRN for weight > 165 lbs. He endorsed eating soup twice a week. Reported DOE, LLE and orthopnea. Had not taken metolazone that Monday. Medication and dietary compliance was stressed. He since developed urinary retention 2/2 BPH followed by urology. A trial with a Foley cath for urinary retention was pursued. He notes the bag would often fill throughout the night, multiple times a day and he would not accumulate fluid. Since discontinuing the Foley cath, he developed a UTI and has not taken BPH meds regularly. He subsequently noticed of worsening LE edema, SOB and orthopnea. In the ED, u/a revealed UTI and he was started on Rocephin. pBNP [id number]. CXR revealed small R pleural effusion and RLL atelectasis, stable cardiomegaly and chronic pulmonary venous HTN. TnI WNL x 4.  He started on IV Lasix and continued on metolazone. LE dopplers neg for DVT. I/O - [phone] but creatinine continues to rise (baseline 1.7-1.9, now 2.39).   Problem List: Past Medical History  Diagnosis Date  . ALCOHOL ABUSE, HX OF 04/30/2008  . 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 Atrial flutter 09/20/2006  . 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 Chronic systolic heart failure 09/20/2006    previous EF 25%;  Echocardiogram 12/10/10: Mild-moderate LVH, EF 50-55%, mild MR, moderate LAE, moderate RAE, mild RVE with moderately reduced RV SF, PASP 50, small pericardial effusion  . COPD 12/17/2006  . CORONARY ARTERY DISEASE 09/20/2006    s/p stent to OM;  catheterization 12/06: Mid to distal LAD 30%, D2 20%, proximal circumflex 30%, OM 2 stents patent, ostial RCA 50%, mid RCA 40%, distal RCA multiple 40%  . 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 DYSPEPSIA&OTHER [facility] DISORDERS FUNCTION STOMACH 04/04/2009  . HYPERTENSION 12/17/2006  . OBESITY 04/30/2008  . HLD (hyperlipidemia) 09/20/2006  . OTITIS EXTERNA, CHRONIC NEC 12/28/2006  . PERSONAL HX COLONIC POLYPS 05/02/2009  . PSORIASIS 04/30/2008  . CKD (chronic kidney disease) 04/30/2008    creatinine 2.2 range    Past Surgical History  Procedure Date  . Cardiac defibrillator placement   . Angioplasty   . Hernia repair     bilat  . Cardiac catheterization   . Coronary angioplasty     Stents placed  . Transthoracic echocardiogram 2005/2006/2008/2011  Allergies:  Allergies  Allergen Reactions  . Aspirin     REACTION: Swelling, trouble breathing  . Tetracycline Hcl     REACTION: Swelling, trouble breathing    Home Medications: Prior to Admission medications   Medication Sig Start Date End Date Taking? Authorizing Provider  albuterol (PROAIR HFA) 108 (90 BASE) MCG/ACT inhaler Inhale 2 puffs into the lungs every 6 (six) hours as needed. For shortness  of breath 03/16/11  Yes Gordy Savers, MD  budesonide-formoterol [facility]) 160-4.5 MCG/ACT inhaler Inhale 2 puffs into the lungs 2 (two) times daily. 08/28/11 08/27/12 Yes Barbaraann Share, MD  CO-ENZYME Q-10 PO Take 1 capsule by mouth daily.   Yes Historical Provider, MD  furosemide (LASIX) 80 MG tablet Take 80 mg by mouth 2 (two) times daily. 04/08/11  Yes Beatrice Lecher, PA  gabapentin (NEURONTIN) 100 MG capsule Take 1 capsule (100 mg total) by mouth 3 (three) times daily. 01/08/12  Yes Gordy Savers, MD  hydrocortisone cream 1 % Apply to affected area 2 times daily 07/19/11 07/18/12 Yes Raeford Razor, MD  insulin glargine (LANTUS) 100 UNIT/ML injection 30 units at bedtime 01/08/12  Yes Gordy Savers, MD  insulin lispro (HUMALOG) 100 UNIT/ML injection Inject 20 Units into the skin 3 (three) times daily before meals. Inject 20 units before each meal - if blood sugars more than 200 add 8 units. 01/21/11  Yes Gordy Savers, MD  metolazone (ZAROXOLYN) 2.5 MG tablet Take one tablet by mouth on Mon/Wed/Fri 30 min prior to Furosemide 05/18/11  Yes Marinus Maw, MD  metoprolol (TOPROL-XL) 50 MG 24 hr tablet Take 25-50 mg by mouth daily. 1/2 tab in the morning and 1 whole tab in the evening   Yes Gordy Savers, MD  potassium chloride SA (K-DUR,KLOR-CON) 20 MEQ tablet TAKE 2 TABLETS BY MOUTH DAILY 11/21/11  Yes Gordy Savers, MD  RABEprazole (ACIPHEX) 20 MG tablet Take 1 tablet (20 mg total) by mouth daily. 09/29/11  Yes Gordy Savers, MD  simvastatin (ZOCOR) 40 MG tablet Take 1 tablet (40 mg total) by mouth at bedtime. 12/08/11  Yes Gordy Savers, MD  temazepam (RESTORIL) 30 MG capsule Take 30 mg by mouth at bedtime as needed. For sleep   Yes Historical Provider, MD  tiotropium (SPIRIVA) 18 MCG inhalation capsule Place 18 mcg into inhaler and inhale daily. 07/22/10 08/06/12 Yes Gordy Savers, MD  traMADol (ULTRAM) 50 MG tablet Take 1 tablet (50 mg total) by mouth  every 6 (six) hours as needed for pain. 11/09/11  Yes Gordy Savers, MD  warfarin (COUMADIN) 5 MG tablet Take 2.5 mg by mouth daily.    Yes Historical Provider, MD    Inpatient Medications:     . budesonide-formoterol  2 puff Inhalation BID  . furosemide  80 mg Intravenous Daily  . gabapentin  100 mg Oral TID  . hydrocerin   Topical BID  . insulin aspart  0-9 Units Subcutaneous TID WC  . metolazone  2.5 mg Oral Daily  . metoprolol succinate  25 mg Oral Daily  . metoprolol succinate  50 mg Oral QHS  . pantoprazole  40 mg Oral Daily  . simvastatin  40 mg Oral QHS  . sodium chloride  3 mL Intravenous Q12H  . sodium chloride  3 mL Intravenous Q12H  . sodium polystyrene  15 g Oral Once  . tiotropium  18 mcg Inhalation Daily  . [COMPLETED] warfarin  2.5 mg Oral ONCE-1800  .  warfarin  2.5 mg Oral ONCE-1800  . Warfarin - Pharmacist Dosing Inpatient   Does not apply q1800  . [DISCONTINUED] cefTRIAXone (ROCEPHIN)  IV  1 g Intravenous Q24H  . [DISCONTINUED] ciprofloxacin  250 mg Oral BID  . [DISCONTINUED] potassium chloride SA  20 mEq Oral Daily   Prescriptions prior to admission  Medication Sig Dispense Refill  . albuterol (PROAIR HFA) 108 (90 BASE) MCG/ACT inhaler Inhale 2 puffs into the lungs every 6 (six) hours as needed. For shortness of breath  1 Inhaler  6  . budesonide-formoterol (SYMBICORT) 160-4.5 MCG/ACT inhaler Inhale 2 puffs into the lungs 2 (two) times daily.  3 Inhaler  1  . CO-ENZYME Q-10 PO Take 1 capsule by mouth daily.      . furosemide (LASIX) 80 MG tablet Take 80 mg by mouth 2 (two) times daily.      Marland Kitchen gabapentin (NEURONTIN) 100 MG capsule Take 1 capsule (100 mg total) by mouth 3 (three) times daily.  90 capsule  3  . hydrocortisone cream 1 % Apply to affected area 2 times daily  15 g  0  . insulin glargine (LANTUS) 100 UNIT/ML injection 30 units at bedtime  10 mL  6  . insulin lispro (HUMALOG) 100 UNIT/ML injection Inject 20 Units into the skin 3 (three) times  daily before meals. Inject 20 units before each meal - if blood sugars more than 200 add 8 units.      . metolazone (ZAROXOLYN) 2.5 MG tablet Take one tablet by mouth on Mon/Wed/Fri 30 min prior to Furosemide  30 tablet  3  . metoprolol (TOPROL-XL) 50 MG 24 hr tablet Take 25-50 mg by mouth daily. 1/2 tab in the morning and 1 whole tab in the evening      . potassium chloride SA (K-DUR,KLOR-CON) 20 MEQ tablet TAKE 2 TABLETS BY MOUTH DAILY  180 tablet  1  . RABEprazole (ACIPHEX) 20 MG tablet Take 1 tablet (20 mg total) by mouth daily.  90 tablet  3  . simvastatin (ZOCOR) 40 MG tablet Take 1 tablet (40 mg total) by mouth at bedtime.  90 tablet  3  . temazepam (RESTORIL) 30 MG capsule Take 30 mg by mouth at bedtime as needed. For sleep      . tiotropium (SPIRIVA) 18 MCG inhalation capsule Place 18 mcg into inhaler and inhale daily.      . traMADol (ULTRAM) 50 MG tablet Take 1 tablet (50 mg total) by mouth every 6 (six) hours as needed for pain.  180 tablet  4  . warfarin (COUMADIN) 5 MG tablet Take 2.5 mg by mouth daily.         Family History  Problem Relation Age of Onset  . Lung cancer Mother     and aunt  . Heart attack Father   . Diabetes Sister     and aunt  . Colon cancer Neg Hx      History   Social History  . Marital Status: Married    Spouse Name: N/A    Number of Children: 0  . Years of Education: N/A   Occupational History  . [employer]    Social History Main Topics  . Smoking status: Former Smoker -- 1.0 packs/day for 50 years    Types: Cigarettes    Quit date: 03/23/2005  . Smokeless tobacco: Never Used  . Alcohol Use: No     Comment: quit drinking 2007  . Drug Use: No  . Sexually Active: Not  on file   Other Topics Concern  . Not on file   Social History Narrative  . No narrative on file     Review of Systems: General: negative for chills, fever, night sweats or weight changes.  Cardiovascular: positive for DOE, LE edema, orthopnea, SOB, negative for chest  pain, palpitations, paroxysmal nocturnal dyspnea Dermatological: negative for rash Respiratory: positive chronic cough, negative for wheezing Urologic: negative for hematuria Abdominal: negative for nausea, vomiting, diarrhea, bright red blood per rectum, melena, or hematemesis Neurologic: negative for visual changes, syncope, or dizziness All other systems reviewed and are otherwise negative except as noted above.  Physical Exam: Blood pressure 109/69, pulse 86, temperature 98.7 F (37.1 C), temperature source Oral, resp. rate 16, height 5\' 5"  (1.651 m), weight 75.6 kg (166 lb 10.7 oz), SpO2 100.00%.    General: Appears older than stated age, in no acute distress. Head: Normocephalic, atraumatic, sclera non-icteric, no xanthomas, nares are without discharge.  Neck: Negative for carotid bruits. JVP 8 cm Lungs: Bibasilar, centralized rales noted. No wheezes or rhonchi. Breathing is unlabored. Heart: RRR with S1 S2. No murmurs, rubs, or gallops appreciated. Abdomen: Distended, non-tender, with distant normoactive bowel sounds. No hepatomegaly. No rebound/guarding. No obvious abdominal masses. Msk:  Strength and tone appears normal for age. Skin: diffuse excoriations and abrasions apparent Extremities: 2+ pitting bilateral pretibial edema, no clubbing or cyanosis.  Distal pedal pulses are 2+ and equal bilaterally. Neuro: Alert and oriented X 3. Moves all extremities spontaneously. Psych:   Responds to questions appropriately with a normal affect.  Labs:  [facility] 02/27/12 0612  VITAMINB12 --  FOLATE --  FERRITIN 123  TIBC 323  IRON 27*  RETICCTPCT --   Lab 02/29/12 0505 02/28/12 1000 02/27/12 0612 02/26/12 0648  NA 132* 135 135 --  K 5.6* 4.9 4.5 --  CL 91* 91* 92* --  CO2 35* 37* 36* --  BUN 69* 63* 51* --  CREATININE 2.39* 2.12* 1.95* --  CALCIUM 8.6 8.8 8.9 --  PROT -- -- -- 8.3  BILITOT -- -- -- 0.9  ALKPHOS -- -- -- 84  ALT -- -- -- 11  AST -- -- -- 19  AMYLASE -- --  -- --  LIPASE -- -- -- --  GLUCOSE 97 104* 87 --   Recent Labs  Basename 02/26/12 1928   CKTOTAL --   CKMB --   CKMBINDEX --   TROPONINI <0.30   Radiology/Studies: Dg Chest 2 View  02/26/2012  *RADIOLOGY REPORT*  Clinical Data: Worsening shortness of breath and lower extremity edema.  CHEST - 2 VIEW  Comparison: 11/15/2011  Findings: Moderate cardiomegaly and chronic pulmonary vascular congestion are stable.  Increased small right pleural effusion and right lower lobe atelectasis demonstrated.  No evidence of pulmonary consolidation. Azygos fissure again noted.  Transvenous pacemaker remains in stable position.  IMPRESSION:  1.  Increased small right pleural effusion and right lower lobe atelectasis. 2.  Stable cardiomegaly and chronic pulmonary venous hypertension.   Original Report Authenticated By: Myles Rosenthal, M.D.    EKG: pending  ASSESSMENT:   1. Acute on chronic hypoxic respiratory failure 2. O2-dependent COPD 3. Acute on chronic combined CHF 4. 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 BPH 6. UTI 7. Atrial flutter 8. CAD 9. Hyperkalemia 10. Hyponatremia  DISCUSSION/PLAN:   Patient has diuresed ~ 4500 cc. He reports initially doing well (SOB and LEE improved), however he has noticed increase LE edema and abdominal distention  since admission. Underlying UTI likely exacerbated CHF. Progressive volume overload may be largely driven by post-renal/obstructive etiologies with reduced excretion given the patient's recent BPH diagnosis. He admits that a trial of indwelling Foley cath for urinary retention was pursued by urology as an outpatient. The bag would often fill throughout the night, multiple times a day and he would not accumulate fluid. Since discontinuing the Foley cath, he developed a UTI and has not taken BPH meds regularly. This temporally corresponds to the patient's worsening LE edema, abdominal distention and SOB. He reports taking his  diuretics regularly during this time. Inpatient, he states he is not having trouble urinating, but notices he only urinates small amounts. Will measure post-void residual to evaluate for urinary retention. If evidence of retention apparent, consider consulting urology service +/- Foley cath placement. Achieving this with underlying UTI would need to be addressed. Hopefully, this would improve A/CKD and volume overload. Going forward, ensuring that he is able to take his BPH medications regularly may prevent progressive volume overload. Will order TTE. Telemetry reveals underlying atrial flutter. Check formal EKG. Continue Coumadin, BB. Hold on ACEi/ARB/spironolactone. Optimistic that urology consult/Foley cath will aide in excretion and diuresis can continue.     Signed, R. Hurman Horn, PA-C 02/29/2012, 3:46 PM The patient was seen together with Mr. Francee Gentile.  I agree with his findings entirely.  The patient has failed to improve so far in the hospital despite IV Lasix.  His weight is actually up since admission and the patient states that his edema which initially appeared to be responding, has now worsened.  He is putting out only small quantities of urine at a time.  He denies any pain in the lower abdomen.  His physical examination shows evidence of significant persistent volume overload with elevated jugular venous pressure, bilateral rales, and 4+ lower extremity peripheral edema bilaterally.  His chest x-ray shows considerable cardiomegaly as well as pulmonary vascular congestion on admission.  A 12-lead EKG has been ordered.  We will also plan to repeat his echocardiogram to look at LV function.  Our present expectation is that his worsening renal function and worsening peripheral edema and weight gain may all be related to BPH with obstructive uropathy.  Will continue to follow with you.

## 2012-02-29 NOTE — Progress Notes (Addendum)
[facility] PROGRESS NOTE  [name] EAV:[id number] DOB: 03-15-1946 DOA: 02/22/2012 PCP: [name], MD  Brief narrative: [name] is a 66 year old man with a past medical history of ischemic cardiomyopathy, coronary artery disease status post stenting, atrial fibrillation status post AV nodal ablation and pacemaker placement, on chronic Coumadin as well as diabetes, and COPD who presented to the hospital on 02/26/2012 with shortness of breath and worsening lower extremity edema.  Assessment/Plan: Principal Problem:  *Acute on chronic combined systolic and diastolic CHF (congestive heart failure) / Small right pleural effusion  EF 45-50% with grade II diastolic dysfunction on 2 D Echo done 04/27/11.  Not on ACE or ARB.  Cannot add with rising creatinine, but will need to consider once creatinine stable.  Initially treated with high-dose Lasix, but rapidly weaned down due to rising creatinine. Continue Lasix, but reduce to once daily given ongoing rise in creatinine. Continue Metolazone.  (On 80 mg orally 3 times a day-3 times a day at home)  Fluid balance remains negative, but the patient is still complaining of significant dyspnea. We'll get a d-dimer to rule out pulmonary embolism. Have requested evaluation by cardiologist for help with management.  Repeat chest x-ray to evaluate if small right pleural effusion is enlarging. Active Problems:  Urinary retention  Not noted in admission H&P, but recent history noted by cardiology consultant.  Post void residual noted to be 258 cc after voiding 300 cc.  Will place on flomax, continue I&O cath PRN, consult urology if problem persists.  Microcytic anemia  Ferritin normal but iron low with low saturation ratios, consistent with AOCD.  Hypokalemia  Placed on oral replacement therapy. Stopped on 02/29/2012 and given a dose of Kayexalate for potassium of 5.6.  Skin wounds  Seen by wound care nurse 02/28/2012. I do not see  any psoriatic plaques. Benadryl cream when necessary for pruritus. Eucerin cream for maintenance of moisturization.  Lower extremity edema  Dopplers done. DVT ruled out.  DM w/o Complication Type II / hypoglycemia  CBG 72-120.  Lantus discontinued on 02/28/2012 secondary to hypoglycemia.  CORONARY ARTERY DISEASE  Troponins negative x 4. No complaints of chest pain. On a statin and a beta blocker.  Atrial fibrillation  Status post AV nodal ablation. Has pacemaker. Rate controlled, on Coumadin.  COPD  Stable. Continue Symbicort and Spiriva.   E. Coli UTI (lower urinary tract infection)  Placed on empiric Rocephin, to which the organism is sensitive. We'll switch to oral therapy with Cipro based on sensitivity data.  Stage III chronic kidney disease  Monitor creatinine with diuresis. Creatinine rising with diuresis. Will lower diuretic dose.   Code Status: Full. Family Communication: None at bedside. Disposition Plan: Home when stable.   Medical Consultants:  Cardiology.  Other Consultants:  Wound Care RN  Anti-infectives:  Rocephin 02/26/2012--->02/29/12  Cipro 02/29/2012--->  HPI/Subjective: [name] continues to have persistent shortness of breath. He states he had a bad night last night with significant dyspnea. No significant cough. Afebrile.  Objective: Filed Vitals:   02/29/12 0538 02/29/12 0728 02/29/12 1005 02/29/12 1400  BP: 112/76  101/58 109/69  Pulse: 85  82 86  Temp: 97.4 F (36.3 C)   98.7 F (37.1 C)  TempSrc: Oral   Oral  Resp: 18   16  Height:      Weight: 75.6 kg (166 lb 10.7 oz)     SpO2: 100% 100%  100%    Intake/Output Summary (Last 24 hours) at 02/29/12 1516 Last  data filed at 02/29/12 1400  Gross per 24 hour  Intake    100 ml  Output    575 ml  Net   -475 ml    Exam: Gen:  NAD Cardiovascular:  RRR, No M/R/G Respiratory:  Lungs with bilateral crackles Gastrointestinal:  Abdomen soft, NT/ND, + BS Extremities:  + edema Skin:  Wounds on extremities, scabbed over.  Data Reviewed: Basic Metabolic Panel:  Lab 02/29/12 9604 02/28/12 1000 02/27/12 0612 02/26/12 0648 02/26/12 0105  NA 132* 135 135 135 132*  K 5.6* 4.9 -- -- --  CL 91* 91* 92* 91* 91*  CO2 35* 37* 36* 35* 31  GLUCOSE 97 104* 87 117* 112*  BUN 69* 63* 51* 53* 54*  CREATININE 2.39* 2.12* 1.95* 1.69* 1.69*  CALCIUM 8.6 8.8 8.9 9.1 8.9  MG -- -- -- 1.9 --  PHOS -- -- -- -- --   GFR Estimated Creatinine Clearance: 28.9 ml/min (by C-G formula based on Cr of 2.39). Liver Function Tests:  Lab 02/26/12 0648  AST 19  ALT 11  ALKPHOS 84  BILITOT 0.9  PROT 8.3  ALBUMIN 2.8*   Coagulation profile  Lab 02/29/12 0505 02/28/12 0520 02/27/12 0612 02/26/12 0105  INR 2.48* 2.39* 2.75* 2.50*  PROTIME -- -- -- --    CBC:  Lab 02/26/12 0648 02/26/12 0105  WBC 5.6 5.4  NEUTROABS -- --  HGB 11.1* 10.7*  HCT 36.4* 35.1*  MCV 77.3* 76.8*  PLT 199 196   Cardiac Enzymes:  Lab 02/26/12 1928 02/26/12 1235 02/26/12 0650 02/26/12 0105  CKTOTAL -- -- -- 91  CKMB -- -- -- 4.6*  CKMBINDEX -- -- -- --  TROPONINI <0.30 <0.30 <0.30 <0.30   CBG (last 3)   Basename 02/29/12 1214 02/29/12 0736 02/28/12 2140  GLUCAP 120* 72 133*     BNP (last 3 results)  Basename 02/29/12 0505 02/26/12 0105 04/07/11 0934  PROBNP 8263.0* 8685.0* 205.0*   Microbiology Recent Results (from the past 240 hour(s))  URINE CULTURE     Status: Normal   Collection Time   02/26/12  1:28 AM      Component Value Range Status Comment   Specimen Description URINE, RANDOM   Final    Special Requests Normal   Final    Culture  Setup Time 02/26/2012 04:22   Final    Colony Count >=100,000 COLONIES/ML   Final    Culture     Final    Value: ESCHERICHIA COLI     Note: Two isolates with different morphologies were identified as the same organism.The most resistant organism was reported.   Report Status 02/29/2012 FINAL   Final    Organism ID, Bacteria ESCHERICHIA COLI   Final       Procedures and Diagnostic Studies:  Dg Chest 2 View 02/26/2012  *RADIOLOGY REPORT*  Clinical Data: Worsening shortness of breath and lower extremity edema.  CHEST - 2 VIEW  Comparison: 11/15/2011  Findings: Moderate cardiomegaly and chronic pulmonary vascular congestion are stable.  Increased small right pleural effusion and right lower lobe atelectasis demonstrated.  No evidence of pulmonary consolidation. Azygos fissure again noted.  Transvenous pacemaker remains in stable position.  IMPRESSION:  1.  Increased small right pleural effusion and right lower lobe atelectasis. 2.  Stable cardiomegaly and chronic pulmonary venous hypertension.   Original Report Authenticated By: Myles Rosenthal, M.D.     Bilateral lower extremity venous duplex 02/26/12. Visualized veins of bilateral lower extremities are negative for deep vein  thrombosis. Unable to visualize the right posterior tibial and peroneal veins in their entirety, therefore cannot exclude deep vein thrombosis in non visualized segments. No evidence of Baker's cyst bilaterally.  Scheduled Meds:    . budesonide-formoterol  2 puff Inhalation BID  . cefTRIAXone (ROCEPHIN)  IV  1 g Intravenous Q24H  . furosemide  80 mg Intravenous Daily  . gabapentin  100 mg Oral TID  . hydrocerin   Topical BID  . insulin aspart  0-9 Units Subcutaneous TID WC  . metolazone  2.5 mg Oral Daily  . metoprolol succinate  25 mg Oral Daily  . metoprolol succinate  50 mg Oral QHS  . pantoprazole  40 mg Oral Daily  . potassium chloride SA  20 mEq Oral Daily  . simvastatin  40 mg Oral QHS  . sodium chloride  3 mL Intravenous Q12H  . sodium chloride  3 mL Intravenous Q12H  . tiotropium  18 mcg Inhalation Daily  . [COMPLETED] warfarin  2.5 mg Oral ONCE-1800  . warfarin  2.5 mg Oral ONCE-1800  . Warfarin - Pharmacist Dosing Inpatient   Does not apply q1800   Continuous Infusions:   Time spent: 35 minutes.   LOS: 4 days   Govanni Plemons  [facility] Pager [phone].  If 8PM-8AM, please contact night-coverage at [email or web address], password [email or web address] 02/29/2012, 3:16 PM

## 2012-02-29 NOTE — Progress Notes (Signed)
Patient voided 300cc, PVR -258, notified Dr. Darnelle Catalan, order given to I/O cath, I/O cath output 450. Reported off to night shift nurse to F/U.

## 2012-02-29 NOTE — Progress Notes (Signed)
ANTICOAGULATION CONSULT NOTE - Follow Up Consult  Pharmacy Consult for Coumadin Indication: atrial fibrillation  Allergies  Allergen Reactions  . Aspirin     REACTION: Swelling, trouble breathing  . Tetracycline Hcl     REACTION: Swelling, trouble breathing   Patient Measurements: Height: 5\' 5"  (165.1 cm) Weight: 166 lb 10.7 oz (75.6 kg) IBW/kg (Calculated) : 61.5   Vital Signs: Temp: 97.4 F (36.3 C) (12/09 0538) Temp src: Oral (12/09 0538) BP: 101/58 mmHg (12/09 1005) Pulse Rate: 82  (12/09 1005)  Labs:  Basename 02/29/12 0505 02/28/12 1000 02/28/12 0520 02/27/12 0612 02/26/12 1928 02/26/12 1235  HGB -- -- -- -- -- --  HCT -- -- -- -- -- --  PLT -- -- -- -- -- --  APTT -- -- -- -- -- --  LABPROT 25.7* -- 25.0* 27.7* -- --  INR 2.48* -- 2.39* 2.75* -- --  HEPARINUNFRC -- -- -- -- -- --  CREATININE 2.39* 2.12* -- 1.95* -- --  CKTOTAL -- -- -- -- -- --  CKMB -- -- -- -- -- --  TROPONINI -- -- -- -- <0.30 <0.30   Estimated Creatinine Clearance: 28.9 ml/min (by C-G formula based on Cr of 2.39).  Medications:  Scheduled:     . budesonide-formoterol  2 puff Inhalation BID  . cefTRIAXone (ROCEPHIN)  IV  1 g Intravenous Q24H  . furosemide  80 mg Intravenous Daily  . gabapentin  100 mg Oral TID  . hydrocerin   Topical BID  . insulin aspart  0-9 Units Subcutaneous TID WC  . metolazone  2.5 mg Oral Daily  . metoprolol succinate  25 mg Oral Daily  . metoprolol succinate  50 mg Oral QHS  . pantoprazole  40 mg Oral Daily  . potassium chloride SA  20 mEq Oral Daily  . simvastatin  40 mg Oral QHS  . sodium chloride  3 mL Intravenous Q12H  . sodium chloride  3 mL Intravenous Q12H  . tiotropium  18 mcg Inhalation Daily  . [COMPLETED] warfarin  2.5 mg Oral ONCE-1800  . warfarin  2.5 mg Oral ONCE-1800  . Warfarin - Pharmacist Dosing Inpatient   Does not apply q1800  . [DISCONTINUED] furosemide  80 mg Intravenous Q12H  . [DISCONTINUED] potassium chloride SA  20 mEq Oral  BID    Assessment: 66 admitted c/o SOB and LE edema on chronic coumadin for A-fib, has pacemaker.  Home dose reported as 2.5mg  daily  INR therapeutic, regular diet No bleeding noted, last CBC 12/6  Goal of Therapy:  INR 2-3   Plan:   Coumadin 2.5mg  today per home dose  Daily PT/INR  CBC tomorrow  Otho Bellows PharmD Pager: [phone] 02/29/2012,10:47 AM

## 2012-03-01 ENCOUNTER — Inpatient Hospital Stay (HOSPITAL_COMMUNITY): Payer: Medicare Other

## 2012-03-01 DIAGNOSIS — I313 Pericardial effusion (noninflammatory): Secondary | ICD-10-CM | POA: Diagnosis present

## 2012-03-01 DIAGNOSIS — I319 Disease of pericardium, unspecified: Secondary | ICD-10-CM

## 2012-03-01 DIAGNOSIS — I4891 Unspecified atrial fibrillation: Secondary | ICD-10-CM

## 2012-03-01 DIAGNOSIS — I509 Heart failure, unspecified: Secondary | ICD-10-CM

## 2012-03-01 LAB — CBC
HCT: 34.2 % — ABNORMAL LOW (ref 39.0–52.0)
MCHC: 29.5 g/dL — ABNORMAL LOW (ref 30.0–36.0)
MCV: 79.9 fL (ref 78.0–100.0)
Platelets: 176 10*3/uL (ref 150–400)
RDW: 18.8 % — ABNORMAL HIGH (ref 11.5–15.5)

## 2012-03-01 LAB — GLUCOSE, CAPILLARY
Glucose-Capillary: 143 mg/dL — ABNORMAL HIGH (ref 70–99)
Glucose-Capillary: 207 mg/dL — ABNORMAL HIGH (ref 70–99)
Glucose-Capillary: 180 mg/dL — ABNORMAL HIGH (ref 70–99)

## 2012-03-01 LAB — BASIC METABOLIC PANEL
BUN: 77 mg/dL — ABNORMAL HIGH (ref 6–23)
Creatinine, Ser: 2.29 mg/dL — ABNORMAL HIGH (ref 0.50–1.35)
GFR calc Af Amer: 33 mL/min — ABNORMAL LOW (ref >90)
GFR calc non Af Amer: 28 mL/min — ABNORMAL LOW (ref >90)
Potassium: 4.5 mEq/L (ref 3.5–5.1)

## 2012-03-01 LAB — BODY FLUID CELL COUNT WITH DIFFERENTIAL
Lymphs, Fluid: 54 %
Neutrophil Count, Fluid: 10 % (ref 0–25)
Other Cells, Fluid: 0 %

## 2012-03-01 LAB — PROTIME-INR: INR: 2.72 — ABNORMAL HIGH (ref 0.00–1.49)

## 2012-03-01 LAB — LACTATE DEHYDROGENASE, PLEURAL OR PERITONEAL FLUID

## 2012-03-01 LAB — GLUCOSE, SEROUS FLUID

## 2012-03-01 MED ORDER — WARFARIN SODIUM 2.5 MG PO TABS
2.5000 mg | ORAL_TABLET | Freq: Once | ORAL | Status: DC
  Filled 2012-03-01: qty 1

## 2012-03-01 NOTE — Procedures (Signed)
Successful US guided right thoracentesis. Yielded 1.8L of clear golden fluid. Pt tolerated procedure well. No immediate complications.  Specimen was sent for labs. CXR ordered.  Brayton El PA-C 03/01/2012 2:29 PM

## 2012-03-01 NOTE — Progress Notes (Signed)
Patient ID: [name], male   DOB: Apr 11, 1945, 66 y.o.   MRN: [id number]    Subjective:  Still with difficulty urinating  Objective:  Filed Vitals:   02/29/12 2130 02/29/12 2156 03/01/12 0329 03/01/12 0456  BP:  107/70  105/61  Pulse: 77 78  85  Temp: 97.8 F (36.6 C)   98.1 F (36.7 C)  TempSrc: Oral   Oral  Resp: 16     Height:      Weight:    160 lb 15 oz (73 kg)  SpO2:  98% 100% 99%    Intake/Output from previous day:  Intake/Output Summary (Last 24 hours) at 03/01/12 0801 Last data filed at 02/29/12 2202  Gross per 24 hour  Intake      3 ml  Output   1651 ml  Net  -1648 ml    Physical Exam: Affect appropriate Desheveled male with poor dentition HEENT: normal Neck supple with no adenopathy JVP normal no bruits no thyromegaly Lungs occasional rhonchi Heart:  S1/S2 no murmur, no rub, gallop or click PMI normal Abdomen: benighn, BS positve, no tenderness, no AAA no bruit.  No HSM or HJR Distal pulses intact with no bruits Plus 2 -3 bilateral  Edema with erythema and excoriations Neuro non-focal Skin warm and dry No muscular weakness   Lab Results: Basic Metabolic Panel:  Basename 03/01/12 0510 02/29/12 0505  NA 131* 132*  K 4.5 5.6*  CL 90* 91*  CO2 32 35*  GLUCOSE 183* 97  BUN 77* 69*  CREATININE 2.29* 2.39*  CALCIUM 8.5 8.6  MG -- --  PHOS -- --     Basename 03/01/12 0510  WBC 7.3  NEUTROABS --  HGB 10.1*  HCT 34.2*  MCV 79.9  PLT 176  D-Dimer:  Basename 02/29/12 1635  DDIMER 0.91*    Imaging: Dg Chest 1 View  02/29/2012  *RADIOLOGY REPORT*  Clinical Data: Heart failure.  CHEST - 1 VIEW  Comparison: PA and lateral chest 02/26/2012 right hip and 11/15/2011.  Findings: Marked enlargement of the cardiopericardial silhouette is again seen.  There has been interval increase in a right effusion and basilar airspace disease.  No left effusion is identified. Left lung appears clear.  No pneumothorax.  IMPRESSION: Increased right  effusion and basilar airspace disease which could which could be due to atelectasis, aspiration or pneumonia.   Original Report Authenticated By: Holley Dexter, M.D.     Cardiac Studies:  ECG: 12/9  Afib V pacing rate 82   Telemetry: Afib with V -pacing rate control good  Echo: pending 04/27/11 EF 45-50% estimated PA pressure 51 mmHg  Medications:     . budesonide-formoterol  2 puff Inhalation BID  . furosemide  80 mg Intravenous Daily  . gabapentin  100 mg Oral TID  . hydrocerin   Topical BID  . insulin aspart  0-9 Units Subcutaneous TID WC  . metolazone  2.5 mg Oral Daily  . metoprolol succinate  25 mg Oral Daily  . metoprolol succinate  50 mg Oral QHS  . pantoprazole  40 mg Oral Daily  . simvastatin  40 mg Oral QHS  . sodium chloride  3 mL Intravenous Q12H  . sodium chloride  3 mL Intravenous Q12H  . [COMPLETED] sodium polystyrene  15 g Oral Once  . Tamsulosin HCl  0.4 mg Oral QPC supper  . tiotropium  18 mcg Inhalation Daily  . [COMPLETED] warfarin  2.5 mg Oral ONCE-1800  . Warfarin - Pharmacist Dosing  Inpatient   Does not apply q1800  . [DISCONTINUED] cefTRIAXone (ROCEPHIN)  IV  1 g Intravenous Q24H  . [DISCONTINUED] ciprofloxacin  250 mg Oral BID  . [DISCONTINUED] potassium chloride SA  20 mEq Oral Daily       Assessment/Plan:  CHF:  Continue current dose of lasix and zaroxyln.  Given elevated Cr cannot increase.  Right sided symtoms Worse and likely related to lung disease CRF:  Needs to see Dr Cassell Smiles in hospital.  Will likely need indwelling foley at D/C Rx infecton Flomax Started yesterday.   Edema:  Elevate legs consider compression hose.  Low sodium diet and diuretic.  Duplex with no DVT Or severe venous insuficiency Afib:  Chronic with V pacing watch INR when on oral antibiotics as it is likely to go hgher Chol:  Continue statin  [name] 03/01/2012, 8:01 AM

## 2012-03-01 NOTE — Progress Notes (Signed)
Post Void Residual was 0 ml.  Will continue to monitor patient.

## 2012-03-01 NOTE — Progress Notes (Signed)
  Echocardiogram 2D Echocardiogram has been performed.  [name] 03/01/2012, 11:17 AM

## 2012-03-01 NOTE — Progress Notes (Signed)
ANTICOAGULATION CONSULT NOTE - Follow Up Consult  Pharmacy Consult for Coumadin Indication: atrial fibrillation  Allergies  Allergen Reactions  . Aspirin     REACTION: Swelling, trouble breathing  . Tetracycline Hcl     REACTION: Swelling, trouble breathing   Patient Measurements: Height: 5\' 5"  (165.1 cm) Weight: 160 lb 15 oz (73 kg) IBW/kg (Calculated) : 61.5   Vital Signs: Temp: 98.1 F (36.7 C) (12/10 0456) Temp src: Oral (12/10 0456) BP: 105/61 mmHg (12/10 0456) Pulse Rate: 76  (12/10 0826)  Labs:  Basename 03/01/12 0510 02/29/12 0505 02/28/12 1000 02/28/12 0520  HGB 10.1* -- -- --  HCT 34.2* -- -- --  PLT 176 -- -- --  APTT -- -- -- --  LABPROT 27.5* 25.7* -- 25.0*  INR 2.72* 2.48* -- 2.39*  HEPARINUNFRC -- -- -- --  CREATININE 2.29* 2.39* 2.12* --  CKTOTAL -- -- -- --  CKMB -- -- -- --  TROPONINI -- -- -- --   Estimated Creatinine Clearance: 27.6 ml/min (by C-G formula based on Cr of 2.29).  Medications:  Scheduled:     . budesonide-formoterol  2 puff Inhalation BID  . furosemide  80 mg Intravenous Daily  . gabapentin  100 mg Oral TID  . hydrocerin   Topical BID  . insulin aspart  0-9 Units Subcutaneous TID WC  . metolazone  2.5 mg Oral Daily  . metoprolol succinate  25 mg Oral Daily  . metoprolol succinate  50 mg Oral QHS  . pantoprazole  40 mg Oral Daily  . simvastatin  40 mg Oral QHS  . sodium chloride  3 mL Intravenous Q12H  . sodium chloride  3 mL Intravenous Q12H  . [COMPLETED] sodium polystyrene  15 g Oral Once  . Tamsulosin HCl  0.4 mg Oral QPC supper  . tiotropium  18 mcg Inhalation Daily  . [COMPLETED] warfarin  2.5 mg Oral ONCE-1800  . Warfarin - Pharmacist Dosing Inpatient   Does not apply q1800  . [DISCONTINUED] cefTRIAXone (ROCEPHIN)  IV  1 g Intravenous Q24H  . [DISCONTINUED] ciprofloxacin  250 mg Oral BID  . [DISCONTINUED] potassium chloride SA  20 mEq Oral Daily    Assessment: 66 admitted c/o SOB and LE edema on chronic  coumadin for A-fib, has pacemaker. Rate controlled. Home dose reported as 2.5mg  daily  INR therapeutic, regular diet No bleeding noted CBC today ok, Hgb, Plt slightly decreased.  Goal of Therapy:  INR 2-3   Plan:   Coumadin 2.5mg  today per home dose  Daily PT/INR  Otho Bellows PharmD Pager: [phone] 03/01/2012,11:39 AM

## 2012-03-01 NOTE — Progress Notes (Addendum)
[facility] PROGRESS NOTE  [name] AVW:[id number] DOB: 12-Mar-1946 DOA: 09-Mar-2012 PCP: [name], MD  Brief narrative: [name] is a 66 year old man with a past medical history of ischemic cardiomyopathy, coronary artery disease status post stenting, atrial fibrillation status post AV nodal ablation and pacemaker placement, on chronic Coumadin as well as diabetes, and COPD who presented to the hospital on 02/26/2012 with shortness of breath and worsening lower extremity edema. Cardiology was consulted on 02/29/2012. They uncovered the fact that the patient had recently had issues with urinary retention and he was placed on Flomax with no further urinary retention. A renal ultrasound was ordered to see if he had underlying hydronephrosis, and this is pending. A two-dimensional echocardiogram was done on 03/01/2012 and Dr. [name] called me with the findings of a large pericardial effusion without tamponade physiology. He recommended holding the patient's Coumadin in case a pericardial window or pericardiocentesis needed to be performed.  In addition, he underwent a diagnostic and therapeutic thoracentesis on 03/01/2012 with 1.8 L of fluid removed.  Assessment/Plan: Principal Problem:  *Acute on chronic combined systolic and diastolic CHF (congestive heart failure) / Small right pleural effusion  EF 45-50% with grade II diastolic dysfunction on 2 D Echo done 04/27/11.  Not on ACE or ARB.  Cannot add with rising creatinine, but will need to consider once creatinine stable.  Initially treated with high-dose Lasix, but rapidly weaned down due to rising creatinine. Continue Lasix, but reduce to once daily given ongoing rise in creatinine. Continue Metolazone.  (On 80 mg of Lasix orally 3 times a day-3 times a day at home)  Fluid balance remains negative, but the patient is still complaining of significant dyspnea. D-dimer was mildly elevated. Creatinine precludes getting a CT  angiogram. Consider VQ scan if dyspnea not improved in the next 24 hours.  Seen by cardiologist 02/29/2012.  Repeat 2 D Echo done 03/01/12.  F/U results.  Repeat chest x-ray 02/29/2012 showed an enlarging effusion. Status post thoracentesis with 1.8 L of fluid removed. Followup Gram stain and culture, cell count, LDH, protein and glucose. Active Problems:  Pericardial effusion  No obvious tamponade physiology, but monitor blood pressure and pulse closely. Hold Coumadin in anticipation of possible need for pericardiocentesis or pericardial window.  Urinary retention  Not noted in admission H&P, but recent history noted by cardiology consultant.  Post void residual noted to be 258 cc after voiding 300 cc.    Placed on Flomax last evening with improvement in urine output and no post void residual.  Addendum: Called at 6 pm by RN who states patient only voided once this shift.  708 cc in bladder on bladder scan.  Ordered foley placement.  No hydronephrosis on renal ultrasound. (Patient did not take medications ordered by urologist because he couldn't afford them).  Can d/c home with foley, outpatient urology followup.  Microcytic anemia  Ferritin normal but iron low with low saturation ratios, consistent with AOCD.  Hypokalemia / hyperkalemia  Placed on oral replacement therapy. Stopped on 02/29/2012 and given a dose of Kayexalate for potassium of 5.6 02/29/2012. Potassium now normalized.  Skin wounds  Seen by wound care nurse 02/28/2012. I do not see any psoriatic plaques. Benadryl cream when necessary for pruritus. Eucerin cream for maintenance of moisturization.  Lower extremity edema  Dopplers done. DVT ruled out.  DM w/o Complication Type II / hypoglycemia  CBG 143-150.  Lantus discontinued on 02/28/2012 secondary to hypoglycemia.  Sugars now stable.  CORONARY ARTERY DISEASE  Troponins negative x 4. No complaints of chest pain. On a statin and a beta blocker.  Atrial  fibrillation  Status post AV nodal ablation. Has pacemaker. Rate controlled, on Coumadin.  COPD  Stable. Continue Symbicort and Spiriva.   E. Coli UTI (lower urinary tract infection)  Placed on empiric Rocephin, to which the organism is sensitive. Antibiotics changed to Cipro 02/29/12 based on sensitivity data.  Stage III chronic kidney disease  Monitor creatinine with diuresis. Creatinine rising with diuresis. Diuretics have been tapered down. Creatinine essentially stable over the past 24 hours.   Code Status: Full. Family Communication: Wife at bedside. Disposition Plan: Home when stable.   Medical Consultants:  Dr. Cassell Clement, Cardiology.  Other Consultants:  Wound Care RN  Anti-infectives:  Rocephin 02/26/2012--->02/29/12  Cipro 02/29/2012--->  HPI/Subjective: [name] continues to have persistent shortness of breath, although mildly improved from yesterday.  Nervous about thoracentesis, says his mother "died" from a similar procedure (sounds like she had a VATS by his description).  Appetite fair.  Legs still massively swollen.  Objective: Filed Vitals:   02/29/12 2130 02/29/12 2156 03/01/12 0329 03/01/12 0456  BP:  107/70  105/61  Pulse: 77 78  85  Temp: 97.8 F (36.6 C)   98.1 F (36.7 C)  TempSrc: Oral   Oral  Resp: 16     Height:      Weight:    73 kg (160 lb 15 oz)  SpO2:  98% 100% 99%    Intake/Output Summary (Last 24 hours) at 03/01/12 4540 Last data filed at 02/29/12 2202  Gross per 24 hour  Intake      3 ml  Output   1651 ml  Net  -1648 ml    Exam: Gen:  NAD Cardiovascular:  RRR, No M/R/G Respiratory:  Lungs with bilateral crackles Gastrointestinal:  Abdomen soft, NT/ND, + BS Extremities:  4+ edema Skin: Wounds on extremities, scabbed over.  Data Reviewed: Basic Metabolic Panel:  Lab 03/01/12 9811 02/29/12 0505 02/28/12 1000 02/27/12 0612 02/26/12 0648  NA 131* 132* 135 135 135  K 4.5 5.6* -- -- --  CL 90* 91* 91* 92* 91*   CO2 32 35* 37* 36* 35*  GLUCOSE 183* 97 104* 87 117*  BUN 77* 69* 63* 51* 53*  CREATININE 2.29* 2.39* 2.12* 1.95* 1.69*  CALCIUM 8.5 8.6 8.8 8.9 9.1  MG -- -- -- -- 1.9  PHOS -- -- -- -- --   GFR Estimated Creatinine Clearance: 27.6 ml/min (by C-G formula based on Cr of 2.29). Liver Function Tests:  Lab 02/26/12 0648  AST 19  ALT 11  ALKPHOS 84  BILITOT 0.9  PROT 8.3  ALBUMIN 2.8*   Coagulation profile  Lab 03/01/12 0510 02/29/12 0505 02/28/12 0520 02/27/12 0612 02/26/12 0105  INR 2.72* 2.48* 2.39* 2.75* 2.50*  PROTIME -- -- -- -- --    CBC:  Lab 03/01/12 0510 02/26/12 0648 02/26/12 0105  WBC 7.3 5.6 5.4  NEUTROABS -- -- --  HGB 10.1* 11.1* 10.7*  HCT 34.2* 36.4* 35.1*  MCV 79.9 77.3* 76.8*  PLT 176 199 196   Cardiac Enzymes:  Lab 02/26/12 1928 02/26/12 1235 02/26/12 0650 02/26/12 0105  CKTOTAL -- -- -- 91  CKMB -- -- -- 4.6*  CKMBINDEX -- -- -- --  TROPONINI <0.30 <0.30 <0.30 <0.30   CBG (last 3)   Basename 03/01/12 0740 02/29/12 2145 02/29/12 1658  GLUCAP 150* 143* 147*     BNP (last 3 results)  Basename  02/29/12 0505 02/26/12 0105 04/07/11 0934  PROBNP 8263.0* 8685.0* 205.0*   Microbiology Recent Results (from the past 240 hour(s))  URINE CULTURE     Status: Normal   Collection Time   02/26/12  1:28 AM      Component Value Range Status Comment   Specimen Description URINE, RANDOM   Final    Special Requests Normal   Final    Culture  Setup Time 02/26/2012 04:22   Final    Colony Count >=100,000 COLONIES/ML   Final    Culture     Final    Value: ESCHERICHIA COLI     Note: Two isolates with different morphologies were identified as the same organism.The most resistant organism was reported.   Report Status 02/29/2012 FINAL   Final    Organism ID, Bacteria ESCHERICHIA COLI   Final      Procedures and Diagnostic Studies:  Dg Chest 2 View 02/26/2012  *RADIOLOGY REPORT*  Clinical Data: Worsening shortness of breath and lower extremity edema.   CHEST - 2 VIEW  Comparison: 11/15/2011  Findings: Moderate cardiomegaly and chronic pulmonary vascular congestion are stable.  Increased small right pleural effusion and right lower lobe atelectasis demonstrated.  No evidence of pulmonary consolidation. Azygos fissure again noted.  Transvenous pacemaker remains in stable position.  IMPRESSION:  1.  Increased small right pleural effusion and right lower lobe atelectasis. 2.  Stable cardiomegaly and chronic pulmonary venous hypertension.   Original Report Authenticated By: Myles Rosenthal, M.D.     Bilateral lower extremity venous duplex 02/26/12. Visualized veins of bilateral lower extremities are negative for deep vein thrombosis. Unable to visualize the right posterior tibial and peroneal veins in their entirety, therefore cannot exclude deep vein thrombosis in non visualized segments. No evidence of Baker's cyst bilaterally.   Dg Chest 1 View 02/29/2012 IMPRESSION: Increased right effusion and basilar airspace disease which could which could be due to atelectasis, aspiration or pneumonia.   Original Report Authenticated By: Holley Dexter, M.D.     Scheduled Meds:    . budesonide-formoterol  2 puff Inhalation BID  . furosemide  80 mg Intravenous Daily  . gabapentin  100 mg Oral TID  . hydrocerin   Topical BID  . insulin aspart  0-9 Units Subcutaneous TID WC  . metolazone  2.5 mg Oral Daily  . metoprolol succinate  25 mg Oral Daily  . metoprolol succinate  50 mg Oral QHS  . pantoprazole  40 mg Oral Daily  . simvastatin  40 mg Oral QHS  . sodium chloride  3 mL Intravenous Q12H  . sodium chloride  3 mL Intravenous Q12H  . [COMPLETED] sodium polystyrene  15 g Oral Once  . Tamsulosin HCl  0.4 mg Oral QPC supper  . tiotropium  18 mcg Inhalation Daily  . [COMPLETED] warfarin  2.5 mg Oral ONCE-1800  . Warfarin - Pharmacist Dosing Inpatient   Does not apply q1800  . [DISCONTINUED] cefTRIAXone (ROCEPHIN)  IV  1 g Intravenous Q24H  . [DISCONTINUED]  ciprofloxacin  250 mg Oral BID  . [DISCONTINUED] potassium chloride SA  20 mEq Oral Daily   Continuous Infusions:   Time spent: 35 minutes.   LOS: 5 days   Gwendalyn Mcgonagle  [facility] Pager [phone].  If 8PM-8AM, please contact night-coverage at [email or web address], password [facility] 03/01/2012, 8:07 AM

## 2012-03-01 NOTE — Progress Notes (Signed)
Pt reported only having voided one time today. He did not save urine so I was unable to determine amount. Bladder scanner obtained and pt found to have of urine retained in his bladder. MD made aware of findings and order given to place a catheter. Catheter placed with no complications and minor discomfort of pt. Urine returned and catheter inserted to the Y before inflating balloon. Will continue to monitor pt's output.   Arta Bruce [facility]

## 2012-03-02 ENCOUNTER — Encounter: Payer: Self-pay | Admitting: *Deleted

## 2012-03-02 DIAGNOSIS — R339 Retention of urine, unspecified: Secondary | ICD-10-CM

## 2012-03-02 DIAGNOSIS — I319 Disease of pericardium, unspecified: Secondary | ICD-10-CM

## 2012-03-02 LAB — GLUCOSE, CAPILLARY
Glucose-Capillary: 199 mg/dL — ABNORMAL HIGH (ref 70–99)
Glucose-Capillary: 121 mg/dL — ABNORMAL HIGH (ref 70–99)
Glucose-Capillary: 94 mg/dL (ref 70–99)

## 2012-03-02 LAB — URINALYSIS, ROUTINE W REFLEX MICROSCOPIC
Bilirubin Urine: NEGATIVE
Ketones, ur: NEGATIVE mg/dL
Specific Gravity, Urine: 1.018 (ref 1.005–1.030)
pH: 5 (ref 5.0–8.0)

## 2012-03-02 LAB — URINE MICROSCOPIC-ADD ON

## 2012-03-02 LAB — BASIC METABOLIC PANEL
Calcium: 8.7 mg/dL (ref 8.4–10.5)
Chloride: 88 mEq/L — ABNORMAL LOW (ref 96–112)
Creatinine, Ser: 2.46 mg/dL — ABNORMAL HIGH (ref 0.50–1.35)
GFR calc Af Amer: 30 mL/min — ABNORMAL LOW (ref >90)
Sodium: 132 mEq/L — ABNORMAL LOW (ref 135–145)

## 2012-03-02 LAB — PROTIME-INR
INR: 2.54 — ABNORMAL HIGH (ref 0.00–1.49)
Prothrombin Time: 26.1 seconds — ABNORMAL HIGH (ref 11.6–15.2)

## 2012-03-02 MED ORDER — METOPROLOL SUCCINATE 12.5 MG HALF TABLET
12.5000 mg | ORAL_TABLET | Freq: Once | ORAL | Status: AC
  Administered 2012-03-02: 12.5 mg via ORAL
  Filled 2012-03-02: qty 1

## 2012-03-02 NOTE — Progress Notes (Signed)
ANTICOAGULATION CONSULT NOTE - Follow Up Consult  Pharmacy Consult for Warfarin Indication: atrial fibrillation  Allergies  Allergen Reactions  . Aspirin     REACTION: Swelling, trouble breathing  . Tetracycline Hcl     REACTION: Swelling, trouble breathing   Patient Measurements: Height: 5\' 5"  (165.1 cm) Weight: 160 lb 15 oz (73 kg) IBW/kg (Calculated) : 61.5   Vital Signs: Temp: 97.8 F (36.6 C) (12/11 0545) Temp src: Oral (12/11 0545) BP: 108/68 mmHg (12/11 0545) Pulse Rate: 85  (12/11 0545)  Labs:  Basename 03/02/12 0508 03/01/12 0510 02/29/12 0505  HGB -- 10.1* --  HCT -- 34.2* --  PLT -- 176 --  APTT -- -- --  LABPROT 26.1* 27.5* 25.7*  INR 2.54* 2.72* 2.48*  HEPARINUNFRC -- -- --  CREATININE 2.46* 2.29* 2.39*  CKTOTAL -- -- --  CKMB -- -- --  TROPONINI -- -- --   Estimated Creatinine Clearance: 25.7 ml/min (by C-G formula based on Cr of 2.46).  Medications:  Scheduled:     . budesonide-formoterol  2 puff Inhalation BID  . furosemide  80 mg Intravenous Daily  . gabapentin  100 mg Oral TID  . hydrocerin   Topical BID  . insulin aspart  0-9 Units Subcutaneous TID WC  . metolazone  2.5 mg Oral Daily  . metoprolol succinate  25 mg Oral Daily  . metoprolol succinate  50 mg Oral QHS  . pantoprazole  40 mg Oral Daily  . simvastatin  40 mg Oral QHS  . sodium chloride  3 mL Intravenous Q12H  . Tamsulosin HCl  0.4 mg Oral QPC supper  . tiotropium  18 mcg Inhalation Daily  . [DISCONTINUED] sodium chloride  3 mL Intravenous Q12H  . [DISCONTINUED] warfarin  2.5 mg Oral ONCE-1800  . [DISCONTINUED] Warfarin - Pharmacist Dosing Inpatient   Does not apply q1800    Assessment: 66 admitted c/o SOB and LE edema on chronic coumadin for A-fib, +pacemaker, rate controlled. PTA warfarin dose reported as 2.5mg  daily  S/P thoracentesis 12/10. INR therapeutic (2.54) but planning to hold d/t pericardial effusion seen on Echo  Goal of Therapy:  INR 2-3   Plan:  Hold  warfarin Daily INR  Lynann Beaver PharmD, BCPS Pager [phone] 03/02/2012 12:21 PM

## 2012-03-02 NOTE — Progress Notes (Signed)
[facility] PROGRESS NOTE  GREEN QUINCY ZOX:[id number] DOB: [birth year] DOA: 03-01-12 PCP: [name], MD  Assessment/Plan: 1-Acute on chronic combined systolic and diastolic CHF (congestive heart failure) / Small right pleural effusion  EF 50 to 55 % ECHO 03-01-2012. Continue Lasix, and Metolazone, follow daily Bmet. (On 80 mg of Lasix orally 3 times a day at home)  Repeat chest x-ray 02/29/2012 showed an enlarging effusion. Status post thoracentesis with 1.8 L of fluid removed on 12-10. culture no growth to date, cell count WBC 172, LDH 91, protein 2.9 and glucose156. Appreciate cardiology assistance.   2-Pericardial effusion  No obvious tamponade physiology.  Hold Coumadin in anticipation of possible need for pericardiocentesis or pericardial window. Cardiology following. Patient will need repeat ECHO. Will follow cardiology recommendation.  3-Urinary retention  Patient had  708 cc in bladder on bladder scan 12-10.  Foley catheter placed. No hydronephrosis on renal ultrasound. Continue with Flomax.  Can d/c home with foley, outpatient urology followup. Will need to make sure patient able to afford medications prior to discharge.  4-Microcytic anemia  Ferritin normal but iron low with low saturation ratios, consistent with AOCD. 5-Hypokalemia / hyperkalemia  Placed on oral replacement therapy. Stopped on 02/29/2012 and given a dose of Kayexalate for potassium of 5.6 02/29/2012. Potassium now normalized. 6-Skin wounds  Seen by wound care nurse 02/28/2012. Benadryl cream when necessary for pruritus. Eucerin cream for maintenance of moisturization. 7-Lower extremity edema  Dopplers done. DVT ruled out. 8-DM w/o Complication Type II / hypoglycemia  CBG 140-200. Lantus discontinued on 02/28/2012 secondary to hypoglycemia. SSI.  9-Atrial fibrillation  Status post AV nodal ablation. Has pacemaker. Rate controlled, on Coumadin. 10-COPD  Stable. Continue Symbicort and  Spiriva. 11-E. Coli UTI (lower urinary tract infection)  Placed on empiric Rocephin, to which the organism is sensitive. Antibiotics changed to Cipro 02/29/12 based on sensitivity data. Day 5/10 antibiotics.  12-Stage III chronic kidney disease  Creatinine rising with diuresis.  Cr baseline 1.6 to 1.9 prior records.  Urine out put 1.6L on 12-10.Marland Kitchen Continue to monitor urine out put. Repeat B-met am.   Code Status: Full.  Family Communication: Plan of Care discussed with patient. Disposition Plan: Home when stable.   Consultants:  Cardiology, Dr. Eden Emms.   Procedures:  Thoracentesis 12-10.   Antibiotics: Rocephin 02/26/2012--->02/29/12  Cipro 02/29/2012--->  HPI/Subjective: Patient feeling better, SOB improved after thoracentesis.  He is aware that needs to follow up with urology outpatient.   Objective: Filed Vitals:   03/01/12 2259 03/02/12 0500 03/02/12 0545 03/02/12 0921  BP:   108/68   Pulse:   85   Temp:   97.8 F (36.6 C)   TempSrc:   Oral   Resp:   20   Height:      Weight:  73 kg (160 lb 15 oz)    SpO2: 98%  100% 98%    Intake/Output Summary (Last 24 hours) at 03/02/12 1225 Last data filed at 03/02/12 1206  Gross per 24 hour  Intake    960 ml  Output    825 ml  Net    135 ml   Filed Weights   02/29/12 0538 03/01/12 0456 03/02/12 0500  Weight: 75.6 kg (166 lb 10.7 oz) 73 kg (160 lb 15 oz) 73 kg (160 lb 15 oz)    Exam:   General:  No distress.  Cardiovascular: S 1, S 2 RRR  Respiratory: Crackles bases.  Abdomen: BS present, soft, NT, NR  Extremities; Plus 2 edema.  Redness.   Data Reviewed: Basic Metabolic Panel:  Lab 03/02/12 4098 03/01/12 0510 02/29/12 0505 02/28/12 1000 02/27/12 0612 02/26/12 0648  NA 132* 131* 132* 135 135 --  K 4.6 4.5 5.6* 4.9 4.5 --  CL 88* 90* 91* 91* 92* --  CO2 35* 32 35* 37* 36* --  GLUCOSE 151* 183* 97 104* 87 --  BUN 84* 77* 69* 63* 51* --  CREATININE 2.46* 2.29* 2.39* 2.12* 1.95* --  CALCIUM 8.7 8.5 8.6 8.8  8.9 --  MG -- -- -- -- -- 1.9  PHOS -- -- -- -- -- --   Liver Function Tests:  Lab 02/26/12 0648  AST 19  ALT 11  ALKPHOS 84  BILITOT 0.9  PROT 8.3  ALBUMIN 2.8*   CBC:  Lab 03/01/12 0510 02/26/12 0648 02/26/12 0105  WBC 7.3 5.6 5.4  NEUTROABS -- -- --  HGB 10.1* 11.1* 10.7*  HCT 34.2* 36.4* 35.1*  MCV 79.9 77.3* 76.8*  PLT 176 199 196   Cardiac Enzymes:  Lab 02/26/12 1928 02/26/12 1235 02/26/12 0650 02/26/12 0105  CKTOTAL -- -- -- 91  CKMB -- -- -- 4.6*  CKMBINDEX -- -- -- --  TROPONINI <0.30 <0.30 <0.30 <0.30   BNP (last 3 results)  Basename 02/29/12 0505 02/26/12 0105 04/07/11 0934  PROBNP 8263.0* 8685.0* 205.0*   CBG:  Lab 03/02/12 0738 03/01/12 2227 03/01/12 1653 03/01/12 1140 03/01/12 0740  GLUCAP 144* 180* 207* 143* 150*    Recent Results (from the past 240 hour(s))  URINE CULTURE     Status: Normal   Collection Time   02/26/12  1:28 AM      Component Value Range Status Comment   Specimen Description URINE, RANDOM   Final    Special Requests Normal   Final    Culture  Setup Time 02/26/2012 04:22   Final    Colony Count >=100,000 COLONIES/ML   Final    Culture     Final    Value: ESCHERICHIA COLI     Note: Two isolates with different morphologies were identified as the same organism.The most resistant organism was reported.   Report Status 02/29/2012 FINAL   Final    Organism ID, Bacteria ESCHERICHIA COLI   Final   BODY FLUID CULTURE     Status: Normal (Preliminary result)   Collection Time   03/01/12  2:09 PM      Component Value Range Status Comment   Specimen Description PLEURAL RIGHT EFFUSION   Final    Special Requests Normal   Final    Gram Stain     Final    Value: RARE WBC PRESENT, PREDOMINANTLY PMN     NO ORGANISMS SEEN   Culture NO GROWTH 1 DAY   Final    Report Status PENDING   Incomplete      Studies: Dg Chest 1 View  03/01/2012  *RADIOLOGY REPORT*  Clinical Data: Post right thoracentesis  CHEST - 1 VIEW  Comparison: 02/29/2012   Findings: Left subclavian transvenous pacemaker leads unchanged. Enlargement of cardiac silhouette with pulmonary vascular congestion. Decrease in right pleural effusion and basilar atelectasis since previous study. Azygos fissure at right apex. No definite pneumothorax post thoracentesis. Linear subsegmental atelectasis and small effusion at left base. Bones demineralized.  IMPRESSION: No pneumothorax following right thoracentesis. Small residual bibasilar effusions and subsegmental atelectasis.   Original Report Authenticated By: Ulyses Southward, M.D.    Dg Chest 1 View  02/29/2012  *RADIOLOGY REPORT*  Clinical Data: Heart failure.  CHEST - 1 VIEW  Comparison: PA and lateral chest 02/26/2012 right hip and 11/15/2011.  Findings: Marked enlargement of the cardiopericardial silhouette is again seen.  There has been interval increase in a right effusion and basilar airspace disease.  No left effusion is identified. Left lung appears clear.  No pneumothorax.  IMPRESSION: Increased right effusion and basilar airspace disease which could which could be due to atelectasis, aspiration or pneumonia.   Original Report Authenticated By: Holley Dexter, M.D.    US Renal  03/01/2012  *RADIOLOGY REPORT*  Clinical Data: Urinary retention  RENAL/URINARY TRACT ULTRASOUND COMPLETE  Comparison:  CT of abdomen pelvis, 05/04/2009  Findings:  Right Kidney:  The right kidney shows mildly increased parenchymal echogenicity.  There is no right renal mass or stone or hydronephrosis.  The right kidney measures 11 cm in long axis.  Left Kidney:  The left kidney is mildly echogenic.   No left renal masses or stones or hydronephrosis.  The kidney is normal in size measuring 11 cm in long axis.  Bladder:  Moderately distended and unremarkable  IMPRESSION: Mild increased echogenicity of the kidneys consistent with medical renal disease.  No renal masses or stones.  No hydronephrosis.   Original Report Authenticated By: Amie Portland, M.D.     US Thoracentesis Asp Pleural Space W/img Guide  03/01/2012  *RADIOLOGY REPORT*  Clinical Data:  Dyspnea on exertion, enlarging right pleural effusion  ULTRASOUND GUIDED right THORACENTESIS  Comparison:  None  An ultrasound guided thoracentesis was thoroughly discussed with the patient and questions answered.  The benefits, risks, alternatives and complications were also discussed.  The patient understands and wishes to proceed with the procedure.  Written consent was obtained.  Ultrasound was performed to localize and mark an adequate pocket of fluid in the right chest.  The area was then prepped and draped in the normal sterile fashion.  1% Lidocaine was used for local anesthesia.  Under ultrasound guidance a 19 gauge Yueh catheter was introduced.  Thoracentesis was performed.  The catheter was removed and a dressing applied.  Complications:  None immediate  Findings: A total of approximately 1.8 liters of clear Golden fluid was removed. A fluid sample was sent for laboratory analysis.  IMPRESSION: Successful ultrasound guided right thoracentesis yielding 1.8 liters of pleural fluid.  Read by Brayton El PA-C   Original Report Authenticated By: Irish Lack, M.D.     Scheduled Meds:   . budesonide-formoterol  2 puff Inhalation BID  . furosemide  80 mg Intravenous Daily  . gabapentin  100 mg Oral TID  . hydrocerin   Topical BID  . insulin aspart  0-9 Units Subcutaneous TID WC  . metolazone  2.5 mg Oral Daily  . metoprolol succinate  25 mg Oral Daily  . metoprolol succinate  50 mg Oral QHS  . pantoprazole  40 mg Oral Daily  . simvastatin  40 mg Oral QHS  . sodium chloride  3 mL Intravenous Q12H  . Tamsulosin HCl  0.4 mg Oral QPC supper  . tiotropium  18 mcg Inhalation Daily  . [DISCONTINUED] sodium chloride  3 mL Intravenous Q12H  . [DISCONTINUED] warfarin  2.5 mg Oral ONCE-1800  . [DISCONTINUED] Warfarin - Pharmacist Dosing Inpatient   Does not apply q1800   Continuous Infusions:    Principal Problem:  *Acute on chronic combined systolic and diastolic CHF (congestive heart failure) Active Problems:  DM w/o Complication Type II  CORONARY ARTERY DISEASE  Atrial fibrillation  COPD  Hypokalemia  Hypoglycemia  UTI (lower urinary tract infection)  Stage III chronic kidney disease  Lower extremity edema  Microcytic anemia  Hyperkalemia  Pleural effusion, right  Urinary retention with incomplete bladder emptying  Pericardial effusion    Time spent: 35 minutes.    Chanteria Haggard  [facility] Pager [phone]. If 8PM-8AM, please contact night-coverage at [email or web address], password [facility] 03/02/2012, 12:25 PM  LOS: 6 days

## 2012-03-02 NOTE — Progress Notes (Signed)
Pt has only had 270 ml of urine output, via foley, since 7am. Bladder scan performed to ensure foley was working properly and 0ml found in bladder. MD made aware and no new orders received. MD stated she would consult Nephrology for in the am. Will continue to monitor urine output.   Arta Bruce [facility]

## 2012-03-02 NOTE — Progress Notes (Signed)
Patient ID: [name], male   DOB: 08/13/1945, 66 y.o.   MRN: [id number]    Subjective:  No complaints.  Feels breathing is better after thoracentesis  Objective:  Filed Vitals:   03/01/12 2150 03/01/12 2259 03/02/12 0500 03/02/12 0545  BP: 109/67   108/68  Pulse: 78   85  Temp: 97.5 F (36.4 C)   97.8 F (36.6 C)  TempSrc: Oral   Oral  Resp: 18   20  Height:      Weight:   160 lb 15 oz (73 kg)   SpO2: 99% 98%  100%    Intake/Output from previous day:  Intake/Output Summary (Last 24 hours) at 03/02/12 4098 Last data filed at 03/02/12 0546  Gross per 24 hour  Intake    480 ml  Output    650 ml  Net   -170 ml    Physical Exam: Affect appropriate Desheveled male with poor dentition HEENT: normal Neck supple with no adenopathy JVP normal no bruits no thyromegaly Lungs occasional rhonchi atelectasis right base Heart:  S1/S2 no murmur, no rub, gallop or click PMI normal Abdomen: benighn, BS positve, no tenderness, no AAA no bruit.  No HSM or HJR Distal pulses intact with no bruits Plus 2 -3 bilateral  Edema with erythema and excoriations Neuro non-focal Skin warm and dry No muscular weakness   Lab Results: Basic Metabolic Panel:  Basename 03/02/12 0508 03/01/12 0510  NA 132* 131*  K 4.6 4.5  CL 88* 90*  CO2 35* 32  GLUCOSE 151* 183*  BUN 84* 77*  CREATININE 2.46* 2.29*  CALCIUM 8.7 8.5  MG -- --  PHOS -- --     Basename 03/01/12 0510  WBC 7.3  NEUTROABS --  HGB 10.1*  HCT 34.2*  MCV 79.9  PLT 176  D-Dimer:  Basename 02/29/12 1635  DDIMER 0.91*    Imaging: Dg Chest 1 View  03/01/2012  *RADIOLOGY REPORT*  Clinical Data: Post right thoracentesis  CHEST - 1 VIEW  Comparison: 02/29/2012  Findings: Left subclavian transvenous pacemaker leads unchanged. Enlargement of cardiac silhouette with pulmonary vascular congestion. Decrease in right pleural effusion and basilar atelectasis since previous study. Azygos fissure at right apex. No  definite pneumothorax post thoracentesis. Linear subsegmental atelectasis and small effusion at left base. Bones demineralized.  IMPRESSION: No pneumothorax following right thoracentesis. Small residual bibasilar effusions and subsegmental atelectasis.   Original Report Authenticated By: Ulyses Southward, M.D.    Dg Chest 1 View  02/29/2012  *RADIOLOGY REPORT*  Clinical Data: Heart failure.  CHEST - 1 VIEW  Comparison: PA and lateral chest 02/26/2012 right hip and 11/15/2011.  Findings: Marked enlargement of the cardiopericardial silhouette is again seen.  There has been interval increase in a right effusion and basilar airspace disease.  No left effusion is identified. Left lung appears clear.  No pneumothorax.  IMPRESSION: Increased right effusion and basilar airspace disease which could which could be due to atelectasis, aspiration or pneumonia.   Original Report Authenticated By: Holley Dexter, M.D.    US Renal  03/01/2012  *RADIOLOGY REPORT*  Clinical Data: Urinary retention  RENAL/URINARY TRACT ULTRASOUND COMPLETE  Comparison:  CT of abdomen pelvis, 05/04/2009  Findings:  Right Kidney:  The right kidney shows mildly increased parenchymal echogenicity.  There is no right renal mass or stone or hydronephrosis.  The right kidney measures 11 cm in long axis.  Left Kidney:  The left kidney is mildly echogenic.   No left renal masses or  stones or hydronephrosis.  The kidney is normal in size measuring 11 cm in long axis.  Bladder:  Moderately distended and unremarkable  IMPRESSION: Mild increased echogenicity of the kidneys consistent with medical renal disease.  No renal masses or stones.  No hydronephrosis.   Original Report Authenticated By: Amie Portland, M.D.    US Thoracentesis Asp Pleural Space W/img Guide  03/01/2012  *RADIOLOGY REPORT*  Clinical Data:  Dyspnea on exertion, enlarging right pleural effusion  ULTRASOUND GUIDED right THORACENTESIS  Comparison:  None  An ultrasound guided thoracentesis  was thoroughly discussed with the patient and questions answered.  The benefits, risks, alternatives and complications were also discussed.  The patient understands and wishes to proceed with the procedure.  Written consent was obtained.  Ultrasound was performed to localize and mark an adequate pocket of fluid in the right chest.  The area was then prepped and draped in the normal sterile fashion.  1% Lidocaine was used for local anesthesia.  Under ultrasound guidance a 19 gauge Yueh catheter was introduced.  Thoracentesis was performed.  The catheter was removed and a dressing applied.  Complications:  None immediate  Findings: A total of approximately 1.8 liters of clear Golden fluid was removed. A fluid sample was sent for laboratory analysis.  IMPRESSION: Successful ultrasound guided right thoracentesis yielding 1.8 liters of pleural fluid.  Read by Brayton El PA-C   Original Report Authenticated By: Irish Lack, M.D.     Cardiac Studies:  ECG: 12/9  Afib V pacing rate 82   Telemetry: Afib with V -pacing rate control good  Echo: pending 04/27/11 EF 45-50% estimated PA pressure 51 mmHg  Medications:      . budesonide-formoterol  2 puff Inhalation BID  . furosemide  80 mg Intravenous Daily  . gabapentin  100 mg Oral TID  . hydrocerin   Topical BID  . insulin aspart  0-9 Units Subcutaneous TID WC  . metolazone  2.5 mg Oral Daily  . metoprolol succinate  25 mg Oral Daily  . metoprolol succinate  50 mg Oral QHS  . pantoprazole  40 mg Oral Daily  . simvastatin  40 mg Oral QHS  . sodium chloride  3 mL Intravenous Q12H  . sodium chloride  3 mL Intravenous Q12H  . Tamsulosin HCl  0.4 mg Oral QPC supper  . tiotropium  18 mcg Inhalation Daily  . [DISCONTINUED] warfarin  2.5 mg Oral ONCE-1800  . [DISCONTINUED] Warfarin - Pharmacist Dosing Inpatient   Does not apply q1800       Assessment/Plan:  CHF:  Continue current dose of lasix and zaroxyln.  Given elevated Cr cannot increase.  Right  sided symtoms Worse and likely related to lung disease CRF:  Foley  Flomax outpatient urology f/u per primary service Edema:  Elevate legs consider compression hose.  Low sodium diet and diuretic.  Duplex with no DVT Or severe venous insuficiency Afib:  Chronic with V pacing Echo with pericardial effusion and no tamponade on echo or clinically  Hold Anticoagulation for at least a week Chol:  Continue statin  [name] 03/02/2012, 8:28 AM

## 2012-03-03 DIAGNOSIS — J9610 Chronic respiratory failure, unspecified whether with hypoxia or hypercapnia: Secondary | ICD-10-CM

## 2012-03-03 LAB — RENAL FUNCTION PANEL
Albumin: 2.6 g/dL — ABNORMAL LOW (ref 3.5–5.2)
BUN: 94 mg/dL — ABNORMAL HIGH (ref 6–23)
CO2: 31 mEq/L (ref 19–32)
Chloride: 89 mEq/L — ABNORMAL LOW (ref 96–112)
Creatinine, Ser: 2.83 mg/dL — ABNORMAL HIGH (ref 0.50–1.35)
GFR calc Af Amer: 22 mL/min — ABNORMAL LOW (ref >90)
Phosphorus: 6.2 mg/dL — ABNORMAL HIGH (ref 2.3–4.6)
Potassium: 5.5 mEq/L — ABNORMAL HIGH (ref 3.5–5.1)
Sodium: 129 mEq/L — ABNORMAL LOW (ref 135–145)

## 2012-03-03 LAB — GLUCOSE, CAPILLARY
Glucose-Capillary: 164 mg/dL — ABNORMAL HIGH (ref 70–99)
Glucose-Capillary: 160 mg/dL — ABNORMAL HIGH (ref 70–99)

## 2012-03-03 LAB — PROTIME-INR
INR: 2.19 — ABNORMAL HIGH (ref 0.00–1.49)
Prothrombin Time: 23.4 seconds — ABNORMAL HIGH (ref 11.6–15.2)

## 2012-03-03 LAB — CREATININE, URINE, RANDOM: Creatinine, Urine: 94.9 mg/dL

## 2012-03-03 MED ORDER — ALBUMIN HUMAN 25 % IV SOLN
25.0000 g | Freq: Once | INTRAVENOUS | Status: DC
  Filled 2012-03-03: qty 100

## 2012-03-03 MED ORDER — ENOXAPARIN SODIUM 40 MG/0.4ML ~~LOC~~ SOLN
40.0000 mg | SUBCUTANEOUS | Status: DC
  Administered 2012-03-03 – 2012-03-04 (×2): 40 mg via SUBCUTANEOUS
  Filled 2012-03-03 (×2): qty 0.4

## 2012-03-03 MED ORDER — ALBUMIN HUMAN 25 % IV SOLN
25.0000 g | Freq: Once | INTRAVENOUS | Status: AC
  Administered 2012-03-03: 25 g via INTRAVENOUS
  Filled 2012-03-03: qty 100

## 2012-03-03 MED ORDER — ALBUMIN HUMAN 25 % IV SOLN
25.0000 g | Freq: Once | INTRAVENOUS | Status: AC
  Administered 2012-03-04: 25 g via INTRAVENOUS
  Filled 2012-03-03: qty 100

## 2012-03-03 NOTE — Progress Notes (Signed)
[facility] PROGRESS NOTE  [name]:[id number] DOB: 02-May-1945 DOA: 03-01-12 PCP: [name], MD   Assessment/Plan:  1-Acute on chronic combined systolic and diastolic CHF (congestive heart failure) / Small right pleural effusion  EF 50 to 55 % ECHO 03-01-2012.  Continue Lasix, follow daily Bmet.  Repeat chest x-ray 02/29/2012 showed an enlarging effusion. Status post thoracentesis with 1.8 L of fluid removed on 12-10. culture no growth to date, cell count WBC 172, LDH 91, protein 2.9 and glucose156.  Appreciate cardiology assistance.  2-Pericardial effusion  No obvious tamponade physiology. Hold Coumadin in anticipation of possible need for pericardiocentesis or pericardial window.              Cardiology following. Patient will need repeat ECHO in 2 weeks.  3-Urinary retention  Patient had 708 cc in bladder on bladder scan 12-10. Foley catheter placed. No hydronephrosis on renal ultrasound.  Continue with Flomax.  Can d/c home with foley, outpatient urology followup.  Will need to make sure patient able to afford medications prior to discharge.  4-Microcytic anemia  Ferritin normal but iron low with low saturation ratios, consistent with AOCD. 5-Hypokalemia / hyperkalemia  Placed on oral replacement therapy. Stopped on 02/29/2012 and given a dose of Kayexalate for potassium of 5.6 02/29/2012. Mildly increase K level. 6-Skin wounds  Seen by wound care nurse 02/28/2012. Benadryl cream when necessary for pruritus. Eucerin cream for maintenance of moisturization. 7-Lower extremity edema  Dopplers done. DVT ruled out. 8-DM w/o Complication Type II / hypoglycemia  CBG 140-200. Lantus discontinued on 02/28/2012 secondary to hypoglycemia. SSI.  9-Atrial fibrillation  Status post AV nodal ablation. Has pacemaker. Rate controlled, on Coumadin. 10-COPD  Stable. Continue Symbicort and Spiriva. 11-E. Coli UTI (lower urinary tract infection)  Placed on empiric  Rocephin, to which the organism is sensitive. Antibiotics changed to Cipro 02/29/12 based on sensitivity data.  Day 6/10 antibiotics.  12-Stage III chronic kidney disease  Creatinine rising with diuresis.  Cr baseline 1.6 to 1.9 prior records.  Patient oliguric. Renal Consulted.  Code Status: Full.  Family Communication: Plan of Care discussed with patient.  Disposition Plan: Home when stable.   Consultants:  Cardiology, Dr. Eden Emms.  Dr Allena Katz, Nephrologist Procedures:  Thoracentesis 12-10.  Antibiotics:  Rocephin 02/26/2012--->02/29/12  Cipro 02/29/2012--->   HPI/Subjective: I saw patient earlier today. This is late entrance. Patient relates improvement of SOB.  No others complaint. He appreciate care and information provide by Dr Allena Katz.  Objective: Filed Vitals:   03/03/12 1034 03/03/12 1325 03/03/12 1620 03/03/12 1736  BP: 96/62 91/59 103/68 103/69  Pulse: 79 86 82 80  Temp:  98.1 F (36.7 C) 97.9 F (36.6 C) 97.5 F (36.4 C)  TempSrc:  Oral Oral Oral  Resp:  18 20 22   Height:      Weight:      SpO2:  99% 100%     Intake/Output Summary (Last 24 hours) at 03/03/12 2037 Last data filed at 03/03/12 1631  Gross per 24 hour  Intake   1300 ml  Output    450 ml  Net    850 ml   Filed Weights   03/01/12 0456 03/02/12 0500 03/03/12 0500  Weight: 73 kg (160 lb 15 oz) 73 kg (160 lb 15 oz) 74.481 kg (164 lb 3.2 oz)    Exam:   General:  No distress.  Cardiovascular: S 1, S 2 RRR  Respiratory: crackles bases  Abdomen: BS present, soft, NT  Extremities; Plus 2 edema.  Data Reviewed: Basic Metabolic Panel:  Lab 03/03/12 1610 03/03/12 0435 03/02/12 0508 03/01/12 0510 02/29/12 0505 02/26/12 0648  NA 129* 129* 132* 131* 132* --  K 5.5* 5.5* 4.6 4.5 5.6* --  CL 87* 89* 88* 90* 91* --  CO2 33* 31 35* 32 35* --  GLUCOSE 170* 123* 151* 183* 97 --  BUN 97* 94* 84* 77* 69* --  CREATININE 3.11* 2.83* 2.46* 2.29* 2.39* --  CALCIUM 8.2* 8.0* 8.7 8.5 8.6 --  MG -- -- --  -- -- 1.9  PHOS 6.2* 6.1* -- -- -- --   Liver Function Tests:  Lab 03/03/12 1555 03/03/12 0435 02/26/12 0648  AST -- -- 19  ALT -- -- 11  ALKPHOS -- -- 84  BILITOT -- -- 0.9  PROT -- -- 8.3  ALBUMIN 2.6* 2.5* 2.8*   No results found for this basename: LIPASE:5,AMYLASE:5 in the last 168 hours No results found for this basename: AMMONIA:5 in the last 168 hours CBC:  Lab 03/01/12 0510 02/26/12 0648 02/26/12 0105  WBC 7.3 5.6 5.4  NEUTROABS -- -- --  HGB 10.1* 11.1* 10.7*  HCT 34.2* 36.4* 35.1*  MCV 79.9 77.3* 76.8*  PLT 176 199 196   Cardiac Enzymes:  Lab 02/26/12 1928 02/26/12 1235 02/26/12 0650 02/26/12 0105  CKTOTAL -- -- -- 91  CKMB -- -- -- 4.6*  CKMBINDEX -- -- -- --  TROPONINI <0.30 <0.30 <0.30 <0.30   BNP (last 3 results)  Basename 02/29/12 0505 02/26/12 0105 04/07/11 0934  PROBNP 8263.0* 8685.0* 205.0*   CBG:  Lab 03/03/12 1658 03/03/12 1141 03/03/12 0733 03/02/12 2232 03/02/12 1657  GLUCAP 160* 164* 108* 94 121*    Recent Results (from the past 240 hour(s))  URINE CULTURE     Status: Normal   Collection Time   02/26/12  1:28 AM      Component Value Range Status Comment   Specimen Description URINE, RANDOM   Final    Special Requests Normal   Final    Culture  Setup Time 02/26/2012 04:22   Final    Colony Count >=100,000 COLONIES/ML   Final    Culture     Final    Value: ESCHERICHIA COLI     Note: Two isolates with different morphologies were identified as the same organism.The most resistant organism was reported.   Report Status 02/29/2012 FINAL   Final    Organism ID, Bacteria ESCHERICHIA COLI   Final   BODY FLUID CULTURE     Status: Normal (Preliminary result)   Collection Time   03/01/12  2:09 PM      Component Value Range Status Comment   Specimen Description PLEURAL RIGHT EFFUSION   Final    Special Requests Normal   Final    Gram Stain     Final    Value: RARE WBC PRESENT, PREDOMINANTLY PMN     NO ORGANISMS SEEN   Culture NO GROWTH 2  DAYS   Final    Report Status PENDING   Incomplete      Studies: No results found.  Scheduled Meds:   . albumin human  25 g Intravenous Once  . budesonide-formoterol  2 puff Inhalation BID  . enoxaparin (LOVENOX) injection  40 mg Subcutaneous Q24H  . furosemide  80 mg Intravenous Daily  . gabapentin  100 mg Oral TID  . hydrocerin   Topical BID  . insulin aspart  0-9 Units Subcutaneous TID WC  . metoprolol succinate  50 mg  Oral QHS  . pantoprazole  40 mg Oral Daily  . simvastatin  40 mg Oral QHS  . sodium chloride  3 mL Intravenous Q12H  . Tamsulosin HCl  0.4 mg Oral QPC supper  . tiotropium  18 mcg Inhalation Daily   Continuous Infusions:   Principal Problem:  *Acute on chronic combined systolic and diastolic CHF (congestive heart failure) Active Problems:  DM w/o Complication Type II  CORONARY ARTERY DISEASE  Atrial fibrillation  COPD  Hypokalemia  Hypoglycemia  UTI (lower urinary tract infection)  Stage III chronic kidney disease  Lower extremity edema  Microcytic anemia  Hyperkalemia  Pleural effusion, right  Urinary retention with incomplete bladder emptying  Pericardial effusion    Time spent: 25 minutes    [name]  [facility] Pager [phone]. If 8PM-8AM, please contact night-coverage at [email or web address], password [facility] 03/03/2012, 8:37 PM  LOS: 7 days

## 2012-03-03 NOTE — Consult Note (Signed)
Reason for Consult: Acute Renal Failure on CKD stage 3 (Baseline creatinine probably 1.6-1.8) Referring Physician: Hartley Barefoot MD ([facility])   HPI:  [name] is a 66 year old Caucasian man with a past medical history of ischemic cardiomyopathy and resulting systolic/diastolic heart failure, coronary artery disease status post stenting, atrial fibrillation status post AV nodal ablation and pacemaker placement (on chronic Coumadin). He also has a history of diabetes without any associated retinopathy and COPD. Presented to the hospital on 02/26/2012 with shortness of breath and worsening lower extremity edema from decompensated heart failure (due to dietary indiscretion and lapses with metolazone compliance versus urinary tract infection and it's metabolic demands). He also was found to have urinary retention in his bladder without any hydronephrosis by renal ultrasound and responded well to placement of Foley catheter/Flomax. A 2D-echocardiogram done on 03/01/2012 showed a large pericardial effusion without tamponade physiology. The patient also had a significant pleural effusion for which he underwent a diagnostic and therapeutic thoracentesis on 03/01/2012 with 1.8 L of fluid removed  Since admission, he has had diuretic therapy however with diminishing urine output and worsening renal function for which we are consulted. He is noted to have had hypotension over the past 24 hours. He has worsening hyponatremia with ongoing metolazone use. Hypoalbuminemia noted. Trend of labs is reflected below.  11/15/2011  01/08/2012  02/26/2012  02/26/2012  02/27/2012  02/28/2012  02/29/2012  03/01/2012  03/02/2012  03/03/2012   BUN 61 (H) 49 (H) 54 (H) 53 (H) 51 (H) 63 (H) 69 (H) 77 (H) 84 (H) 94 (H)  Creatinine 1.68 (H) 1.8 (H) 1.69 (H) 1.69 (H) 1.95 (H) 2.12 (H) 2.39 (H) 2.29 (H) 2.46 (H) 2.83 (H)     Past Medical History  Diagnosis Date  . ALCOHOL ABUSE, HX OF 04/30/2008  . Atrial fibrillation  12/17/2006    s/p AVN ablation and insertion of BiV pacer 12/12  . Atrial flutter 09/20/2006  . 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 Chronic systolic heart failure 09/20/2006    previous EF 25%;  Echocardiogram 12/10/10: Mild-moderate LVH, EF 50-55%, mild MR, moderate LAE, moderate RAE, mild RVE with moderately reduced RV SF, PASP 50, small pericardial effusion  . COPD 12/17/2006  . CORONARY ARTERY DISEASE 09/20/2006    s/p stent to OM;  catheterization 12/06: Mid to distal LAD 30%, D2 20%, proximal circumflex 30%, OM 2 stents patent, ostial RCA 50%, mid RCA 40%, distal RCA multiple 40%  . 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 DYSPEPSIA&OTHER [facility] DISORDERS FUNCTION STOMACH 04/04/2009  . HYPERTENSION 12/17/2006  . OBESITY 04/30/2008  . HLD (hyperlipidemia) 09/20/2006  . OTITIS EXTERNA, CHRONIC NEC 12/28/2006  . PERSONAL HX COLONIC POLYPS 05/02/2009  . PSORIASIS 04/30/2008  . CKD (chronic kidney disease) 04/30/2008    creatinine 2.2 range    Past Surgical History  Procedure Date  . Cardiac defibrillator placement   . Angioplasty   . Hernia repair     bilat  . Cardiac catheterization   . Coronary angioplasty     Stents placed  . Transthoracic echocardiogram 2005/2006/2008/2011    Family History  Problem Relation Age of Onset  . Lung cancer Mother     and aunt  . Heart attack Father   . Diabetes Sister     and aunt  . Colon cancer Neg Hx     Social History:  reports that he quit smoking about 6 years ago. His smoking use included Cigarettes. He has a  50 pack-year smoking history. He has never used smokeless tobacco. He reports that he does not drink alcohol or use illicit drugs.  Allergies:  Allergies  Allergen Reactions  . Aspirin     REACTION: Swelling, trouble breathing  . Tetracycline Hcl     REACTION: Swelling, trouble breathing    Medications:  Scheduled:   . albumin human  25 g Intravenous Once  . albumin human  25 g  Intravenous Once  . budesonide-formoterol  2 puff Inhalation BID  . enoxaparin (LOVENOX) injection  40 mg Subcutaneous Q24H  . furosemide  80 mg Intravenous Daily  . gabapentin  100 mg Oral TID  . hydrocerin   Topical BID  . insulin aspart  0-9 Units Subcutaneous TID WC  . [COMPLETED] metoprolol succinate  12.5 mg Oral Once  . metoprolol succinate  50 mg Oral QHS  . pantoprazole  40 mg Oral Daily  . simvastatin  40 mg Oral QHS  . sodium chloride  3 mL Intravenous Q12H  . Tamsulosin HCl  0.4 mg Oral QPC supper  . tiotropium  18 mcg Inhalation Daily  . [DISCONTINUED] metolazone  2.5 mg Oral Daily  . [DISCONTINUED] metoprolol succinate  25 mg Oral Daily    Results for orders placed during the hospital encounter of 03/08/2012 (from the past 48 hour(s))  GLUCOSE, CAPILLARY     Status: Abnormal   Collection Time   03/01/12 11:40 AM      Component Value Range Comment   Glucose-Capillary 143 (*) 70 - 99 mg/dL   BODY FLUID CELL COUNT WITH DIFFERENTIAL     Status: Abnormal   Collection Time   03/01/12  2:09 PM      Component Value Range Comment   Fluid Type-FCT PLEURAL      Color, Fluid YELLOW (*) YELLOW    Appearance, Fluid CLEAR  CLEAR    WBC, Fluid 172  0 - 1000 cu mm    Neutrophil Count, Fluid 10  0 - 25 %    Lymphs, Fluid 54      Monocyte-Macrophage-Serous Fluid 34 (*) 50 - 90 %    Eos, Fluid 2      Other Cells, Fluid 0     BODY FLUID CULTURE     Status: Normal (Preliminary result)   Collection Time   03/01/12  2:09 PM      Component Value Range Comment   Specimen Description PLEURAL RIGHT EFFUSION      Special Requests Normal      Gram Stain        Value: RARE WBC PRESENT, PREDOMINANTLY PMN     NO ORGANISMS SEEN   Culture NO GROWTH 1 DAY      Report Status PENDING     LACTATE DEHYDROGENASE, BODY FLUID     Status: Abnormal   Collection Time   03/01/12  2:09 PM      Component Value Range Comment   LD, Fluid 91 (*) 3 - 23 U/L    Fluid Type-FLDH PLEURAL     PROTEIN, BODY  FLUID     Status: Normal   Collection Time   03/01/12  2:09 PM      Component Value Range Comment   Total protein, fluid 2.9   NO NORMAL RANGE ESTABLISHED FOR THIS TEST   Fluid Type-FTP PLEURAL     GLUCOSE, SEROUS FLUID     Status: Normal   Collection Time   03/01/12  2:09 PM      Component  Value Range Comment   Glucose, Fluid 156      Fluid Type-FGLU PLEURAL     PATHOLOGIST SMEAR REVIEW     Status: Normal   Collection Time   03/01/12  2:09 PM      Component Value Range Comment   Path Review Reviewed by H. Hollice Espy, M.D.     GLUCOSE, CAPILLARY     Status: Abnormal   Collection Time   03/01/12  4:53 PM      Component Value Range Comment   Glucose-Capillary 207 (*) 70 - 99 mg/dL   GLUCOSE, CAPILLARY     Status: Abnormal   Collection Time   03/01/12 10:27 PM      Component Value Range Comment   Glucose-Capillary 180 (*) 70 - 99 mg/dL   PROTIME-INR     Status: Abnormal   Collection Time   03/02/12  5:08 AM      Component Value Range Comment   Prothrombin Time 26.1 (*) 11.6 - 15.2 seconds    INR 2.54 (*) 0.00 - 1.49   BASIC METABOLIC PANEL     Status: Abnormal   Collection Time   03/02/12  5:08 AM      Component Value Range Comment   Sodium 132 (*) 135 - 145 mEq/L    Potassium 4.6  3.5 - 5.1 mEq/L    Chloride 88 (*) 96 - 112 mEq/L    CO2 35 (*) 19 - 32 mEq/L    Glucose, Bld 151 (*) 70 - 99 mg/dL    BUN 84 (*) 6 - 23 mg/dL    Creatinine, Ser 1.61 (*) 0.50 - 1.35 mg/dL    Calcium 8.7  8.4 - 09.6 mg/dL    GFR calc non Af Amer 26 (*) >90 mL/min    GFR calc Af Amer 30 (*) >90 mL/min   GLUCOSE, CAPILLARY     Status: Abnormal   Collection Time   03/02/12  7:38 AM      Component Value Range Comment   Glucose-Capillary 144 (*) 70 - 99 mg/dL    Comment 1 Notify RN      Comment 2 Documented in Chart     GLUCOSE, CAPILLARY     Status: Abnormal   Collection Time   03/02/12 11:44 AM      Component Value Range Comment   Glucose-Capillary 199 (*) 70 - 99 mg/dL    Comment 1  Notify RN      Comment 2 Documented in Chart     URINALYSIS, ROUTINE W REFLEX MICROSCOPIC     Status: Abnormal   Collection Time   03/02/12  3:14 PM      Component Value Range Comment   Color, Urine YELLOW  YELLOW    APPearance CLOUDY (*) CLEAR    Specific Gravity, Urine 1.018  1.005 - 1.030    pH 5.0  5.0 - 8.0    Glucose, UA NEGATIVE  NEGATIVE mg/dL    Hgb urine dipstick LARGE (*) NEGATIVE    Bilirubin Urine NEGATIVE  NEGATIVE    Ketones, ur NEGATIVE  NEGATIVE mg/dL    Protein, ur 045 (*) NEGATIVE mg/dL    Urobilinogen, UA 1.0  0.0 - 1.0 mg/dL    Nitrite NEGATIVE  NEGATIVE    Leukocytes, UA MODERATE (*) NEGATIVE   SODIUM, URINE, RANDOM     Status: Normal   Collection Time   03/02/12  3:14 PM      Component Value Range Comment   Sodium, Ur  18     CREATININE, URINE, RANDOM     Status: Normal   Collection Time   03/02/12  3:14 PM      Component Value Range Comment   Creatinine, Urine 94.9     URINE MICROSCOPIC-ADD ON     Status: Abnormal   Collection Time   03/02/12  3:14 PM      Component Value Range Comment   Squamous Epithelial / LPF FEW (*) RARE    WBC, UA 7-10  <3 WBC/hpf    RBC / HPF 11-20  <3 RBC/hpf    Bacteria, UA FEW (*) RARE    Urine-Other MUCOUS PRESENT     GLUCOSE, CAPILLARY     Status: Abnormal   Collection Time   03/02/12  4:57 PM      Component Value Range Comment   Glucose-Capillary 121 (*) 70 - 99 mg/dL    Comment 1 Notify RN      Comment 2 Documented in Chart     GLUCOSE, CAPILLARY     Status: Normal   Collection Time   03/02/12 10:32 PM      Component Value Range Comment   Glucose-Capillary 94  70 - 99 mg/dL   PROTIME-INR     Status: Abnormal   Collection Time   03/03/12  4:35 AM      Component Value Range Comment   Prothrombin Time 23.4 (*) 11.6 - 15.2 seconds    INR 2.19 (*) 0.00 - 1.49   RENAL FUNCTION PANEL     Status: Abnormal   Collection Time   03/03/12  4:35 AM      Component Value Range Comment   Sodium 129 (*) 135 - 145 mEq/L     Potassium 5.5 (*) 3.5 - 5.1 mEq/L    Chloride 89 (*) 96 - 112 mEq/L    CO2 31  19 - 32 mEq/L    Glucose, Bld 123 (*) 70 - 99 mg/dL    BUN 94 (*) 6 - 23 mg/dL    Creatinine, Ser 1.61 (*) 0.50 - 1.35 mg/dL    Calcium 8.0 (*) 8.4 - 10.5 mg/dL    Phosphorus 6.1 (*) 2.3 - 4.6 mg/dL    Albumin 2.5 (*) 3.5 - 5.2 g/dL    GFR calc non Af Amer 22 (*) >90 mL/min    GFR calc Af Amer 25 (*) >90 mL/min   GLUCOSE, CAPILLARY     Status: Abnormal   Collection Time   03/03/12  7:33 AM      Component Value Range Comment   Glucose-Capillary 108 (*) 70 - 99 mg/dL    Comment 1 Notify RN      Comment 2 Documented in Chart       Dg Chest 1 View  03/01/2012  *RADIOLOGY REPORT*  Clinical Data: Post right thoracentesis  CHEST - 1 VIEW  Comparison: 02/29/2012  Findings: Left subclavian transvenous pacemaker leads unchanged. Enlargement of cardiac silhouette with pulmonary vascular congestion. Decrease in right pleural effusion and basilar atelectasis since previous study. Azygos fissure at right apex. No definite pneumothorax post thoracentesis. Linear subsegmental atelectasis and small effusion at left base. Bones demineralized.  IMPRESSION: No pneumothorax following right thoracentesis. Small residual bibasilar effusions and subsegmental atelectasis.   Original Report Authenticated By: Ulyses Southward, M.D.    US Renal  03/01/2012  *RADIOLOGY REPORT*  Clinical Data: Urinary retention  RENAL/URINARY TRACT ULTRASOUND COMPLETE  Comparison:  CT of abdomen pelvis, 05/04/2009  Findings:  Right Kidney:  The  right kidney shows mildly increased parenchymal echogenicity.  There is no right renal mass or stone or hydronephrosis.  The right kidney measures 11 cm in long axis.  Left Kidney:  The left kidney is mildly echogenic.   No left renal masses or stones or hydronephrosis.  The kidney is normal in size measuring 11 cm in long axis.  Bladder:  Moderately distended and unremarkable  IMPRESSION: Mild increased echogenicity of the  kidneys consistent with medical renal disease.  No renal masses or stones.  No hydronephrosis.   Original Report Authenticated By: Amie Portland, M.D.    US Thoracentesis Asp Pleural Space W/img Guide  03/01/2012  *RADIOLOGY REPORT*  Clinical Data:  Dyspnea on exertion, enlarging right pleural effusion  ULTRASOUND GUIDED right THORACENTESIS  Comparison:  None  An ultrasound guided thoracentesis was thoroughly discussed with the patient and questions answered.  The benefits, risks, alternatives and complications were also discussed.  The patient understands and wishes to proceed with the procedure.  Written consent was obtained.  Ultrasound was performed to localize and mark an adequate pocket of fluid in the right chest.  The area was then prepped and draped in the normal sterile fashion.  1% Lidocaine was used for local anesthesia.  Under ultrasound guidance a 19 gauge Yueh catheter was introduced.  Thoracentesis was performed.  The catheter was removed and a dressing applied.  Complications:  None immediate  Findings: A total of approximately 1.8 liters of clear Golden fluid was removed. A fluid sample was sent for laboratory analysis.  IMPRESSION: Successful ultrasound guided right thoracentesis yielding 1.8 liters of pleural fluid.  Read by Brayton El PA-C   Original Report Authenticated By: Irish Lack, M.D.     Review of Systems  Constitutional: Positive for malaise/fatigue. Negative for fever, chills, weight loss and diaphoresis.  HENT: Negative.   Respiratory: Positive for cough and shortness of breath. Negative for hemoptysis, sputum production and wheezing.   Cardiovascular: Positive for chest pain, orthopnea and leg swelling. Negative for palpitations, claudication and PND.  Gastrointestinal: Positive for heartburn and nausea. Negative for vomiting, abdominal pain, diarrhea, constipation, blood in stool and melena.  Genitourinary: Negative.   Musculoskeletal: Negative.   Skin:  Negative.   Neurological: Negative.  Negative for weakness.  Endo/Heme/Allergies: Negative.   Psychiatric/Behavioral: Negative.    Blood pressure 96/62, pulse 79, temperature 97.9 F (36.6 C), temperature source Oral, resp. rate 18, height 5\' 5"  (1.651 m), weight 74.481 kg (164 lb 3.2 oz), SpO2 98.00%. Physical Exam  Nursing note and vitals reviewed. Constitutional: He is oriented to person, place, and time. He appears well-developed and well-nourished. No distress.       Unkempt and anxious appearing man  HENT:  Head: Normocephalic and atraumatic.  Nose: Nose normal.  Mouth/Throat: No oropharyngeal exudate.       Dry oropharynx/dry oral mucosa  Eyes: Conjunctivae normal and EOM are normal. Pupils are equal, round, and reactive to light. No scleral icterus.  Neck: Normal range of motion. Neck supple. JVD present. No tracheal deviation present. No thyromegaly present.       JVP 6CM  Cardiovascular: Normal rate and normal heart sounds.  Exam reveals no gallop and no friction rub.   No murmur heard. Respiratory: Effort normal. No stridor. No respiratory distress. He has no wheezes. He has rales. He exhibits no tenderness.       Prominent fine right base rales, diminished breath sounds left base. No wheeze/rhonchi.  GI: Soft. Bowel sounds are normal. He  exhibits distension. He exhibits no mass. There is no tenderness. There is no rebound and no guarding.  Musculoskeletal: Normal range of motion. He exhibits edema. He exhibits no tenderness.       3+ edema bipedally-symmetrical with associated erythema  Lymphadenopathy:    He has no cervical adenopathy.  Neurological: He is alert and oriented to person, place, and time.  Skin: Skin is warm and dry. Rash noted. He is not diaphoretic. There is erythema. No pallor.       Chronic venous stasis dermatitis apparent on lower extremities. Multiple ulcers noted over upper and lower extremities with skin scabbing.  Psychiatric: He has a normal mood  and affect. His behavior is normal.    Assessment/Plan: 1. Acute renal failure chronic kidney disease stage III.: From history, timeline of events and available database-it appears that Mr. Mohr has baseline chronic kidney disease stage III from underlying diabetes, hypertension and chronic cardiorenal syndrome. His most recent acute renal failure seems to be from acute CHF decompensation in hemodynamically mediated injury to the kidney further compounded by RAS activation with aggressive diuretic use. Unfortunately, urine output is noted to be decreasing with worsening hyperkalemia and hyponatremia both reflecting severity of injury. There does not appear to be imminent dialysis needs however suspect that he would need this in the future. At this time, I will slightly decrease his beta blocker dose to allow for higher blood pressures/improved renal perfusion. I will discontinue his metolazone to avoid further hyponatremia and potentiation of volume depletion intravascularly. I recognize that he still has an overall fluid excessive state for which we'll continue him on furosemide and give him intravenous albumin to help with furosemide kinetics. Renal ultrasound reassuringly negative for obstruction however shows features consistent with CK D. Based on the urinary sediment, do not feel that he warrants investigation for glomerulonephritis. I will screen for plasma cell dyscrasia given possibility for secondary amyloid. We discussed dialysis and he seems to be willing to undertake this venture if required in the future. Clarify expectations as well as quality of life issues with dialysis. 2. Hyperkalemia: Due to acute renal failure, I have verified his medications when sure that his not on any potassium supplements. We'll continue to follow this and treat it gets greater than 5.5 with oral Kayexalate. 3. Hyponatremia: Secondary to metolazone versus CHF decompensation. Hold metolazone for now and continue  furosemide. 4. Pleural effusion/pericardial effusion: Reflection of his volume excessive state/CHF-status post diagnostic versus therapeutic thoracentesis. Does not have evident tamponade physiology to warrant pericardiocentesis. 5. Hypoalbuminemia: Suspect chronic malnutrition versus hepatic synthetic defect with passive hepatic congestion.  Hearl Heikes K. 03/03/2012, 11:12 AM

## 2012-03-03 NOTE — Progress Notes (Signed)
Patient ID: [name], male   DOB: 1945/07/10, 66 y.o.   MRN: [id number]    Subjective:  No complaints.  Feels breathing is better after thoracentesis  Objective:  Filed Vitals:   03/02/12 2230 03/02/12 2334 03/03/12 0500 03/03/12 0534  BP: 91/55 95/65  99/62  Pulse: 88   80  Temp: 97.5 F (36.4 C)   97.9 F (36.6 C)  TempSrc: Oral   Oral  Resp:    18  Height:      Weight:   164 lb 3.2 oz (74.481 kg)   SpO2: 99%   100%    Intake/Output from previous day:  Intake/Output Summary (Last 24 hours) at 03/03/12 4098 Last data filed at 03/03/12 1191  Gross per 24 hour  Intake    960 ml  Output    545 ml  Net    415 ml    Physical Exam: Affect appropriate Desheveled male with poor dentition HEENT: normal Neck supple with no adenopathy JVP normal no bruits no thyromegaly Lungs occasional rhonchi atelectasis right base Heart:  S1/S2 no murmur, no rub, gallop or click PMI normal Abdomen: benighn, BS positve, no tenderness, no AAA no bruit.  No HSM or HJR Distal pulses intact with no bruits Plus 2 -3 bilateral  Edema with erythema and excoriations Neuro non-focal Skin warm and dry No muscular weakness   Lab Results: Basic Metabolic Panel:  Basename 03/02/12 0508 03/01/12 0510  NA 132* 131*  K 4.6 4.5  CL 88* 90*  CO2 35* 32  GLUCOSE 151* 183*  BUN 84* 77*  CREATININE 2.46* 2.29*  CALCIUM 8.7 8.5  MG -- --  PHOS -- --     Basename 03/01/12 0510  WBC 7.3  NEUTROABS --  HGB 10.1*  HCT 34.2*  MCV 79.9  PLT 176  D-Dimer:  Basename 02/29/12 1635  DDIMER 0.91*    Imaging: Dg Chest 1 View  03/01/2012  *RADIOLOGY REPORT*  Clinical Data: Post right thoracentesis  CHEST - 1 VIEW  Comparison: 02/29/2012  Findings: Left subclavian transvenous pacemaker leads unchanged. Enlargement of cardiac silhouette with pulmonary vascular congestion. Decrease in right pleural effusion and basilar atelectasis since previous study. Azygos fissure at right apex. No  definite pneumothorax post thoracentesis. Linear subsegmental atelectasis and small effusion at left base. Bones demineralized.  IMPRESSION: No pneumothorax following right thoracentesis. Small residual bibasilar effusions and subsegmental atelectasis.   Original Report Authenticated By: Ulyses Southward, M.D.    US Renal  03/01/2012  *RADIOLOGY REPORT*  Clinical Data: Urinary retention  RENAL/URINARY TRACT ULTRASOUND COMPLETE  Comparison:  CT of abdomen pelvis, 05/04/2009  Findings:  Right Kidney:  The right kidney shows mildly increased parenchymal echogenicity.  There is no right renal mass or stone or hydronephrosis.  The right kidney measures 11 cm in long axis.  Left Kidney:  The left kidney is mildly echogenic.   No left renal masses or stones or hydronephrosis.  The kidney is normal in size measuring 11 cm in long axis.  Bladder:  Moderately distended and unremarkable  IMPRESSION: Mild increased echogenicity of the kidneys consistent with medical renal disease.  No renal masses or stones.  No hydronephrosis.   Original Report Authenticated By: Amie Portland, M.D.    US Thoracentesis Asp Pleural Space W/img Guide  03/01/2012  *RADIOLOGY REPORT*  Clinical Data:  Dyspnea on exertion, enlarging right pleural effusion  ULTRASOUND GUIDED right THORACENTESIS  Comparison:  None  An ultrasound guided thoracentesis was thoroughly discussed  with the patient and questions answered.  The benefits, risks, alternatives and complications were also discussed.  The patient understands and wishes to proceed with the procedure.  Written consent was obtained.  Ultrasound was performed to localize and mark an adequate pocket of fluid in the right chest.  The area was then prepped and draped in the normal sterile fashion.  1% Lidocaine was used for local anesthesia.  Under ultrasound guidance a 19 gauge Yueh catheter was introduced.  Thoracentesis was performed.  The catheter was removed and a dressing applied.  Complications:   None immediate  Findings: A total of approximately 1.8 liters of clear Golden fluid was removed. A fluid sample was sent for laboratory analysis.  IMPRESSION: Successful ultrasound guided right thoracentesis yielding 1.8 liters of pleural fluid.  Read by Brayton El PA-C   Original Report Authenticated By: Irish Lack, M.D.     Cardiac Studies:  ECG: 12/9  Afib V pacing rate 82   Telemetry: Afib with V -pacing rate control good  Echo: pending 04/27/11 EF 45-50% estimated PA pressure 51 mmHg  Medications:      . budesonide-formoterol  2 puff Inhalation BID  . furosemide  80 mg Intravenous Daily  . gabapentin  100 mg Oral TID  . hydrocerin   Topical BID  . insulin aspart  0-9 Units Subcutaneous TID WC  . metolazone  2.5 mg Oral Daily  . [COMPLETED] metoprolol succinate  12.5 mg Oral Once  . metoprolol succinate  25 mg Oral Daily  . metoprolol succinate  50 mg Oral QHS  . pantoprazole  40 mg Oral Daily  . simvastatin  40 mg Oral QHS  . sodium chloride  3 mL Intravenous Q12H  . Tamsulosin HCl  0.4 mg Oral QPC supper  . tiotropium  18 mcg Inhalation Daily  . [DISCONTINUED] sodium chloride  3 mL Intravenous Q12H       Assessment/Plan:  CHF:  Continue current dose of lasix and zaroxyln.  Given elevated Cr cannot increase.  Right sided symtoms Worse and likely related to lung disease CRF:  Foley  Flomax outpatient urology f/u per primary service Edema:  Elevate legs consider compression hose.  Low sodium diet and diuretic.  Duplex with no DVT Or severe venous insuficiency Afib:  Chronic with V pacing Echo with pericardial effusion and no tamponade on echo or clinically  Hold Anticoagulation for at least a week  Lovenox DVT prophylaxis Chol:  Continue statin  Will arrange outpatient f/u and echo in 2 weeks  [name] 03/03/2012, 8:21 AM

## 2012-03-03 NOTE — Progress Notes (Signed)
BP 91/55, HR 88.  States "feels OK".  Denies pain or discomfort, sitting up in bed.  Donnamarie Poag NP made aware of vitals; orders received and implemented.

## 2012-03-04 DIAGNOSIS — R339 Retention of urine, unspecified: Secondary | ICD-10-CM

## 2012-03-04 DIAGNOSIS — N179 Acute kidney failure, unspecified: Secondary | ICD-10-CM | POA: Diagnosis present

## 2012-03-04 LAB — C4 COMPLEMENT: Complement C4, Body Fluid: 24 mg/dL (ref 10–40)

## 2012-03-04 LAB — RENAL FUNCTION PANEL
Albumin: 2.8 g/dL — ABNORMAL LOW (ref 3.5–5.2)
BUN: 101 mg/dL — ABNORMAL HIGH (ref 6–23)
Calcium: 8.2 mg/dL — ABNORMAL LOW (ref 8.4–10.5)
Creatinine, Ser: 3.11 mg/dL — ABNORMAL HIGH (ref 0.50–1.35)
Phosphorus: 6.3 mg/dL — ABNORMAL HIGH (ref 2.3–4.6)
Potassium: 5.5 mEq/L — ABNORMAL HIGH (ref 3.5–5.1)

## 2012-03-04 LAB — GLUCOSE, CAPILLARY
Glucose-Capillary: 171 mg/dL — ABNORMAL HIGH (ref 70–99)
Glucose-Capillary: 309 mg/dL — ABNORMAL HIGH (ref 70–99)
Glucose-Capillary: 137 mg/dL — ABNORMAL HIGH (ref 70–99)

## 2012-03-04 LAB — KAPPA/LAMBDA LIGHT CHAINS: Lambda free light chains: 9.4 mg/dL — ABNORMAL HIGH (ref 0.57–2.63)

## 2012-03-04 LAB — PROTIME-INR: Prothrombin Time: 21.3 seconds — ABNORMAL HIGH (ref 11.6–15.2)

## 2012-03-04 LAB — MAGNESIUM: Magnesium: 2.2 mg/dL (ref 1.5–2.5)

## 2012-03-04 LAB — URINE CULTURE

## 2012-03-04 LAB — PARATHYROID HORMONE, INTACT (NO CA): PTH: 262.5 pg/mL — ABNORMAL HIGH (ref 14.0–72.0)

## 2012-03-04 LAB — ANTI-NUCLEAR AB-TITER (ANA TITER)

## 2012-03-04 MED ORDER — SODIUM POLYSTYRENE SULFONATE 15 GM/60ML PO SUSP
30.0000 g | Freq: Once | ORAL | Status: AC
  Administered 2012-03-04: 30 g via ORAL
  Filled 2012-03-04: qty 120

## 2012-03-04 MED ORDER — SENNOSIDES-DOCUSATE SODIUM 8.6-50 MG PO TABS
1.0000 | ORAL_TABLET | Freq: Two times a day (BID) | ORAL | Status: DC
  Administered 2012-03-04 – 2012-03-21 (×31): 1 via ORAL
  Filled 2012-03-04 (×42): qty 1

## 2012-03-04 MED ORDER — ALBUMIN HUMAN 25 % IV SOLN
12.5000 g | Freq: Two times a day (BID) | INTRAVENOUS | Status: DC
  Administered 2012-03-04 – 2012-03-05 (×2): 12.5 g via INTRAVENOUS
  Filled 2012-03-04 (×3): qty 50

## 2012-03-04 MED ORDER — FUROSEMIDE 10 MG/ML IJ SOLN
60.0000 mg | Freq: Two times a day (BID) | INTRAMUSCULAR | Status: DC
  Administered 2012-03-04: 60 mg via INTRAVENOUS
  Filled 2012-03-04 (×3): qty 6

## 2012-03-04 MED ORDER — POLYETHYLENE GLYCOL 3350 17 G PO PACK
17.0000 g | PACK | Freq: Every day | ORAL | Status: DC
  Administered 2012-03-04 – 2012-03-21 (×14): 17 g via ORAL
  Filled 2012-03-04 (×21): qty 1

## 2012-03-04 MED ORDER — INSULIN GLARGINE 100 UNIT/ML ~~LOC~~ SOLN
5.0000 [IU] | Freq: Every day | SUBCUTANEOUS | Status: DC
  Administered 2012-03-04 – 2012-03-10 (×7): 5 [IU] via SUBCUTANEOUS

## 2012-03-04 MED ORDER — ENOXAPARIN SODIUM 30 MG/0.3ML ~~LOC~~ SOLN
30.0000 mg | SUBCUTANEOUS | Status: DC
  Administered 2012-03-05 – 2012-03-06 (×2): 30 mg via SUBCUTANEOUS
  Filled 2012-03-04 (×2): qty 0.3

## 2012-03-04 MED ORDER — FLEET ENEMA 7-19 GM/118ML RE ENEM: 1.0000 | ENEMA | Freq: Once | RECTAL | Status: DC

## 2012-03-04 NOTE — Progress Notes (Signed)
Pt has been complaining of pain at the tip and inside his penis. Suspecting trauma from putting foley cathter in but pt would like for MD to address.

## 2012-03-04 NOTE — Progress Notes (Signed)
[facility] PROGRESS NOTE  [name] ZOX:[id number] DOB: 09/26/1945 DOA: 02/26/2012 PCP: [name], MD  Assessment/Plan:  1-Acute on chronic combined systolic and diastolic CHF (congestive heart failure) / Small right pleural effusion  EF 50 to 55 % ECHO 03-01-2012.  Continue Lasix 60 mg IV BID, follow daily Bmet.  Repeat chest x-ray 02/29/2012 showed an enlarging effusion. Status post thoracentesis with 1.8 L of fluid removed on 12-10. culture no growth to date, cell count WBC 172, LDH 91, protein 2.9 and glucose156.  Appreciate cardiology assistance.   2-Pericardial effusion  No obvious tamponade physiology. Hold Coumadin in anticipation of possible need for pericardiocentesis or pericardial window.              Patient will need repeat ECHO in 2 weeks.  3-Urinary retention  Patient had 708 cc in bladder on bladder scan 12-10. Foley catheter placed. No hydronephrosis on renal ultrasound.  Continue with Flomax.  Can d/c home with foley, outpatient urology followup.  Will need to make sure patient able to afford medications prior to discharge.  Neosporin cream to catheter area for irritation.  4-Microcytic anemia  Ferritin normal but iron low with low saturation ratios, consistent with AOCD. HB in am.  5-Hypokalemia / hyperkalemia  Placed on oral replacement therapy. Stopped on 02/29/2012 and given a dose of Kayexalate for potassium of 5.6 02/29/2012. Mildly increase K level. Kayexalate ordered. 6-Skin wounds  Seen by wound care nurse 02/28/2012. Benadryl cream when necessary for pruritus. Eucerin cream for maintenance of moisturization. 7-Lower extremity edema  Dopplers done. DVT ruled out. 8-DM w/o Complication Type II / hypoglycemia  CBG 140-200. Lantus discontinued on 02/28/2012 secondary to hypoglycemia. SSI.  Will start low dose lantus.  9-Atrial fibrillation  Status post AV nodal ablation. Has pacemaker. Rate controlled, Coumadin on hold in case patient  required procedure. Cardio recommend holding anticoagulation for at least 1 week.  10-COPD  Stable. Continue Symbicort and Spiriva. 11-E. Coli UTI (lower urinary tract infection)  Placed on empiric Rocephin, to which the organism is sensitive. Antibiotics changed to Cipro 02/29/12 based on sensitivity data.  Day 7/10 antibiotics.  12-Stage III chronic kidney disease  Creatinine rising with diuresis.  Cr baseline 1.6 to 1.9 prior records.  Cr 3.1 today and yesterday. No significant urine output.   Constipation: Start Docusate, miralax.    Code Status: Full.  Family Communication: Plan of Care discussed with patient.  Disposition Plan: Home when stable.    Consultants:  Cardiology, Dr. Eden Emms.  Dr Allena Katz, Nephrologist Procedures:  Thoracentesis 12-10.  Antibiotics:  Rocephin 02/26/2012--->02/29/12  Cipro 02/29/2012--->   HPI/Subjective: No worsening SOB. No BM. Relates pain around foley catheter.   Objective: Filed Vitals:   03/04/12 0619 03/04/12 0937 03/04/12 0957 03/04/12 1120  BP: 102/64  108/66 96/64  Pulse: 80  82 86  Temp: 97.6 F (36.4 C)  97.2 F (36.2 C) 97.3 F (36.3 C)  TempSrc: Oral  Oral Oral  Resp: 20     Height:      Weight:      SpO2: 99% 99%      Intake/Output Summary (Last 24 hours) at 03/04/12 1654 Last data filed at 03/04/12 1500  Gross per 24 hour  Intake    240 ml  Output    400 ml  Net   -160 ml   Filed Weights   03/02/12 0500 03/03/12 0500 03/04/12 0500  Weight: 73 kg (160 lb 15 oz) 74.481 kg (164 lb 3.2 oz) 77.2 kg (  170 lb 3.1 oz)    Exam:   General:  No distress.  Cardiovascular: S 1, S 2 RRR  Respiratory: Crackles at bases.   Abdomen: Bs present, soft, NR.   Data Reviewed: Basic Metabolic Panel:  Lab 03/04/12 1610 03/04/12 0447 03/03/12 1555 03/03/12 0435 03/02/12 0508 03/01/12 0510  NA -- 126* 129* 129* 132* 131*  K -- 5.5* 5.5* 5.5* 4.6 4.5  CL -- 85* 87* 89* 88* 90*  CO2 -- 30 33* 31 35* 32  GLUCOSE -- 200* 170*  123* 151* 183*  BUN -- 101* 97* 94* 84* 77*  CREATININE -- 3.11* 3.11* 2.83* 2.46* 2.29*  CALCIUM -- 8.2* 8.2* 8.0* 8.7 8.5  MG 2.2 -- -- -- -- --  PHOS -- 6.3* 6.2* 6.1* -- --   Liver Function Tests:  Lab 03/04/12 0447 03/03/12 1555 03/03/12 0435  AST -- -- --  ALT -- -- --  ALKPHOS -- -- --  BILITOT -- -- --  PROT -- -- --  ALBUMIN 2.8* 2.6* 2.5*   No results found for this basename: LIPASE:5,AMYLASE:5 in the last 168 hours No results found for this basename: AMMONIA:5 in the last 168 hours CBC:  Lab 03/01/12 0510  WBC 7.3  NEUTROABS --  HGB 10.1*  HCT 34.2*  MCV 79.9  PLT 176   Cardiac Enzymes:  Lab 02/26/12 1928  CKTOTAL --  CKMB --  CKMBINDEX --  TROPONINI <0.30   BNP (last 3 results)  Basename 02/29/12 0505 02/26/12 0105 04/07/11 0934  PROBNP 8263.0* 8685.0* 205.0*   CBG:  Lab 03/04/12 1208 03/04/12 0749 03/03/12 2056 03/03/12 1658 03/03/12 1141  GLUCAP 171* 173* 152* 160* 164*    Recent Results (from the past 240 hour(s))  URINE CULTURE     Status: Normal   Collection Time   02/26/12  1:28 AM      Component Value Range Status Comment   Specimen Description URINE, RANDOM   Final    Special Requests Normal   Final    Culture  Setup Time 02/26/2012 04:22   Final    Colony Count >=100,000 COLONIES/ML   Final    Culture     Final    Value: ESCHERICHIA COLI     Note: Two isolates with different morphologies were identified as the same organism.The most resistant organism was reported.   Report Status 02/29/2012 FINAL   Final    Organism ID, Bacteria ESCHERICHIA COLI   Final   BODY FLUID CULTURE     Status: Normal (Preliminary result)   Collection Time   03/01/12  2:09 PM      Component Value Range Status Comment   Specimen Description PLEURAL RIGHT EFFUSION   Final    Special Requests Normal   Final    Gram Stain     Final    Value: RARE WBC PRESENT, PREDOMINANTLY PMN     NO ORGANISMS SEEN   Culture NO GROWTH 3 DAYS   Final    Report Status  PENDING   Incomplete   URINE CULTURE     Status: Normal   Collection Time   03/02/12  3:14 PM      Component Value Range Status Comment   Specimen Description URINE, CLEAN CATCH   Final    Special Requests NONE   Final    Culture  Setup Time 03/03/2012 01:27   Final    Colony Count NO GROWTH   Final    Culture NO GROWTH  Final    Report Status 03/04/2012 FINAL   Final      Studies: No results found.  Scheduled Meds:   . albumin human  12.5 g Intravenous BID  . budesonide-formoterol  2 puff Inhalation BID  . enoxaparin (LOVENOX) injection  30 mg Subcutaneous Q24H  . furosemide  60 mg Intravenous BID  . gabapentin  100 mg Oral TID  . hydrocerin   Topical BID  . insulin aspart  0-9 Units Subcutaneous TID WC  . metoprolol succinate  50 mg Oral QHS  . pantoprazole  40 mg Oral Daily  . polyethylene glycol  17 g Oral Daily  . senna-docusate  1 tablet Oral BID  . simvastatin  40 mg Oral QHS  . sodium chloride  3 mL Intravenous Q12H  . Tamsulosin HCl  0.4 mg Oral QPC supper  . tiotropium  18 mcg Inhalation Daily   Continuous Infusions:   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 Type II  CORONARY ARTERY DISEASE  Atrial fibrillation  COPD  Hypokalemia  Hypoglycemia  Acute on chronic combined systolic and diastolic CHF (congestive heart failure)  UTI (lower urinary tract infection)  Stage III chronic kidney disease  Lower extremity edema  Microcytic anemia  Hyperkalemia  Pleural effusion, right  Urinary retention with incomplete bladder emptying    Time spent: 25 minutes.    Moody Robben  [facility] Pager [phone]. If 8PM-8AM, please contact night-coverage at [email or web address], password [facility] 03/04/2012, 4:54 PM  LOS: 8 days

## 2012-03-04 NOTE — Progress Notes (Signed)
Patient ID: [name], male   DOB: [birth year], 66 y.o.   MRN: [id number]   [facility] Progress Note    Subjective:   Reports penile tip irritation from foley catheter. Reports no worsening of shortness of breath overnight and states that he feels somewhat better   Objective:   BP 108/66  Pulse 82  Temp 97.2 F (36.2 C) (Oral)  Resp 20  Ht 5\' 5"  (1.651 m)  Wt 77.2 kg (170 lb 3.1 oz)  BMI 28.32 kg/m2  SpO2 99%  Intake/Output Summary (Last 24 hours) at 03/04/12 1051 Last data filed at 03/04/12 0620  Gross per 24 hour  Intake    580 ml  Output    575 ml  Net      5 ml   Weight change: 2.719 kg (5 lb 15.9 oz)  Physical Exam: Gen: Comfortably sitting on the bed eating b/fast "I fell asleep" CVS: Pulse RRR, normal S1 and S2  Resp: Coarse BS bilaterally with right base rales Abd: Soft, obese, NT, BS normal Ext: 3+ LE edema   Imaging: No results found.  Labs: BMET  Lab 03/04/12 0447 03/03/12 1555 03/03/12 0435 03/02/12 0508 03/01/12 0510 02/29/12 0505 02/28/12 1000  NA 126* 129* 129* 132* 131* 132* 135  K 5.5* 5.5* 5.5* 4.6 4.5 5.6* 4.9  CL 85* 87* 89* 88* 90* 91* 91*  CO2 30 33* 31 35* 32 35* 37*  GLUCOSE 200* 170* 123* 151* 183* 97 104*  BUN 101* 97* 94* 84* 77* 69* 63*  CREATININE 3.11* 3.11* 2.83* 2.46* 2.29* 2.39* 2.12*  ALB -- -- -- -- -- -- --  CALCIUM 8.2* 8.2* 8.0* 8.7 8.5 8.6 8.8  PHOS 6.3* 6.2* 6.1* -- -- -- --   CBC  Lab 03/01/12 0510  WBC 7.3  NEUTROABS --  HGB 10.1*  HCT 34.2*  MCV 79.9  PLT 176    Medications:      . albumin human  12.5 g Intravenous BID  . budesonide-formoterol  2 puff Inhalation BID  . enoxaparin (LOVENOX) injection  30 mg Subcutaneous Q24H  . furosemide  60 mg Intravenous BID  . gabapentin  100 mg Oral TID  . hydrocerin   Topical BID  . insulin aspart  0-9 Units Subcutaneous TID WC  . metoprolol succinate  50 mg Oral QHS  . pantoprazole  40 mg Oral Daily  . simvastatin  40 mg Oral QHS  .  sodium chloride  3 mL Intravenous Q12H  . sodium polystyrene  30 g Oral Once  . Tamsulosin HCl  0.4 mg Oral QPC supper  . tiotropium  18 mcg Inhalation Daily     Assessment/ Plan:   1. Acute renal failure chronic kidney disease stage III.: From history, timeline of events and available database-it appears that [name] has baseline chronic kidney disease stage III from underlying diabetes, hypertension and chronic cardiorenal syndrome. His most recent acute renal failure seems to be from acute CHF decompensation in hemodynamically mediated injury to the kidney further compounded by RAS activation with what appears to be evolution to ATN. Urine output remains marginal-we'll continue diuretics/albumin. His renal function is worse compared to yesterday and seemingly at a plateau phase (this will definitively be known tomorrow). In spite of his rising BUN, there does not appear to be imminent dialysis needs however suspect that he would need this in the future. At this time, no uremic symptoms recognized.  2. Hyperkalemia: Due to acute renal failure, I have  verified his medications when sure that his not on any potassium supplements. We'll treat with oral Kayexalate.  3. Hyponatremia: Secondary to metolazone versus CHF decompensation. Metolazone stopped yesterday, will start fluid restriction.  4. Pleural effusion/pericardial effusion: Reflection of his volume excessive state/CHF-status post diagnostic versus therapeutic thoracentesis. Does not have evident tamponade physiology to warrant pericardiocentesis.  5. Hypoalbuminemia: Suspect chronic malnutrition versus hepatic synthetic defect with passive hepatic congestion.   Zetta Bills, MD 03/04/2012, 10:51 AM

## 2012-03-04 NOTE — Progress Notes (Signed)
Pt had run of 3 PVCs.  MD notified.

## 2012-03-05 DIAGNOSIS — D509 Iron deficiency anemia, unspecified: Secondary | ICD-10-CM

## 2012-03-05 LAB — BODY FLUID CULTURE
Culture: NO GROWTH
Special Requests: NORMAL

## 2012-03-05 LAB — CBC
Platelets: 180 10*3/uL (ref 150–400)
RBC: 4.37 MIL/uL (ref 4.22–5.81)
RDW: 18.4 % — ABNORMAL HIGH (ref 11.5–15.5)
WBC: 7.2 10*3/uL (ref 4.0–10.5)

## 2012-03-05 LAB — RENAL FUNCTION PANEL
Calcium: 8.1 mg/dL — ABNORMAL LOW (ref 8.4–10.5)
GFR calc Af Amer: 23 mL/min — ABNORMAL LOW (ref >90)
GFR calc non Af Amer: 20 mL/min — ABNORMAL LOW (ref >90)
Glucose, Bld: 73 mg/dL (ref 70–99)

## 2012-03-05 LAB — MAGNESIUM: Magnesium: 2.1 mg/dL (ref 1.5–2.5)

## 2012-03-05 LAB — PROTIME-INR: Prothrombin Time: 20.2 seconds — ABNORMAL HIGH (ref 11.6–15.2)

## 2012-03-05 LAB — GLUCOSE, CAPILLARY: Glucose-Capillary: 79 mg/dL (ref 70–99)

## 2012-03-05 MED ORDER — BUMETANIDE 0.25 MG/ML IJ SOLN
2.0000 mg | Freq: Every day | INTRAMUSCULAR | Status: DC
  Administered 2012-03-05 – 2012-03-06 (×2): 2 mg via INTRAVENOUS
  Filled 2012-03-05 (×3): qty 8

## 2012-03-05 MED ORDER — TORSEMIDE 20 MG/2ML IV SOLN: 40.0000 mg | Freq: Every day | INTRAVENOUS | Status: DC

## 2012-03-05 NOTE — Plan of Care (Signed)
Problem: Consults Goal: Heart Failure Patient Education (See Patient Education module for education specifics.)  Outcome: Completed/Met Date Met:  03/05/12 Discussed diet, exercise, things to do everyday.

## 2012-03-05 NOTE — Progress Notes (Signed)
[facility] PROGRESS NOTE  [name] ZOX:[id number] DOB: June 27, 1945 DOA: 03/16/2012 PCP: [name], MD  Assessment/Plan: 1-Acute on chronic combined systolic and diastolic CHF (congestive heart failure) / Small right pleural effusion  EF 50 to 55 % ECHO 03-01-2012.  Lasix change to BUMEX. Repeat chest x-ray 02/29/2012 showed an enlarging effusion. Status post thoracentesis with 1.8 L of fluid removed on 12-10. culture no growth to date, cell count WBC 172, LDH 91, protein 2.9 and glucose156.  Appreciate cardiology assistance.  2-Pericardial effusion  No obvious tamponade physiology. Hold Coumadin in anticipation of possible need for pericardiocentesis or pericardial window.  Patient will need repeat ECHO in 1-2 weeks.  3-Urinary retention  Patient had 708 cc in bladder on bladder scan 12-10. Foley catheter placed. No hydronephrosis on renal ultrasound.  Continue with Flomax.  Can d/c home with foley, outpatient urology followup.  Will need to make sure patient able to afford medications prior to discharge.  Neosporin cream to catheter area for irritation.  4-Microcytic anemia  Ferritin normal but iron low with low saturation ratios, consistent with AOCD.  HB at 10, stable.  5-Hypokalemia / hyperkalemia  Placed on oral replacement therapy. Stopped on 02/29/2012 and given a dose of Kayexalate for potassium of 5.6 02/29/2012. Mildly increase K level. Potassium decrease to 4.7. 6-Skin wounds  Seen by wound care nurse 02/28/2012. Benadryl cream when necessary for pruritus. Eucerin cream for maintenance of moisturization. 7-Lower extremity edema  Dopplers done. DVT ruled out. 8-DM w/o Complication Type II / hypoglycemia   Lantus discontinued on 02/28/2012 secondary to hypoglycemia. SSI.  CBG decrease on low dose lantus.  9-Atrial fibrillation  Status post AV nodal ablation. Has pacemaker. Rate controlled, Coumadin on hold in case patient required procedure. Cardio  recommend holding anticoagulation for at least 1 week.  10-COPD  Stable. Continue Symbicort and Spiriva. 11-E. Coli UTI (lower urinary tract infection)  Placed on empiric Rocephin, to which the organism is sensitive. Antibiotics changed to Cipro 02/29/12 based on sensitivity data.  Day 8/10 antibiotics. Treating for complicated UTI due to foley Catheter.  12-Stage III chronic kidney disease   Acute on chronic renal failure secondary to Heart failure exacerbation, hemodynamically mediated injury and evolution of ATN.  Cr baseline 1.6 to 1.9 prior records.  If no improvement will probably require dialysis.  Appreciate Dr Allena Katz Assistance.  13-Constipation: Start Docusate, miralax.   Code Status: Full.   Family Communication: Plan of Care discussed with patient.   Disposition Plan: Home when stable.   Consultants:  Cardiology, Dr. Eden Emms.  Dr Allena Katz, Nephrologist Procedures:  Thoracentesis 12-10.  Antibiotics:  Rocephin 02/26/2012--->02/29/12  Cipro 02/29/2012--->   HPI/Subjective: No worsening SOB. Pain around foley catheter.   Objective: Filed Vitals:   03/05/12 0533 03/05/12 0949 03/05/12 1000 03/05/12 1516  BP: 98/59 102/63 102/63 107/64  Pulse: 79 73 73 73  Temp: 97.3 F (36.3 C) 98.1 F (36.7 C) 98.1 F (36.7 C) 97.5 F (36.4 C)  TempSrc: Oral Oral Oral Oral  Resp: 20 20 20 18   Height:      Weight: 77.747 kg (171 lb 6.4 oz)     SpO2: 97% 100% 100% 95%    Intake/Output Summary (Last 24 hours) at 03/05/12 1552 Last data filed at 03/05/12 1353  Gross per 24 hour  Intake    552 ml  Output    675 ml  Net   -123 ml   Filed Weights   03/03/12 0500 03/04/12 0500 03/05/12 0533  Weight: 74.481  kg (164 lb 3.2 oz) 77.2 kg (170 lb 3.1 oz) 77.747 kg (171 lb 6.4 oz)    Exam:   General:  No distress,   Cardiovascular: S 1, S 2 RRR  Respiratory: Crackles bases.  Abdomen: Bs soft, NT, ND.   Data Reviewed: Basic Metabolic Panel:  Lab 03/05/12 1610 03/04/12 1540  03/04/12 0447 03/03/12 1555 03/03/12 0435 03/02/12 0508  NA 127* -- 126* 129* 129* 132*  K 4.7 -- 5.5* 5.5* 5.5* 4.6  CL 84* -- 85* 87* 89* 88*  CO2 31 -- 30 33* 31 35*  GLUCOSE 73 -- 200* 170* 123* 151*  BUN 104* -- 101* 97* 94* 84*  CREATININE 3.05* -- 3.11* 3.11* 2.83* 2.46*  CALCIUM 8.1* -- 8.2* 8.2* 8.0* 8.7  MG 2.1 2.2 -- -- -- --  PHOS 6.9* -- 6.3* 6.2* 6.1* --   Liver Function Tests:  Lab 03/05/12 0524 03/04/12 0447 03/03/12 1555 03/03/12 0435  AST -- -- -- --  ALT -- -- -- --  ALKPHOS -- -- -- --  BILITOT -- -- -- --  PROT -- -- -- --  ALBUMIN 2.9* 2.8* 2.6* 2.5*   No results found for this basename: LIPASE:5,AMYLASE:5 in the last 168 hours No results found for this basename: AMMONIA:5 in the last 168 hours CBC:  Lab 03/05/12 0524 03/01/12 0510  WBC 7.2 7.3  NEUTROABS -- --  HGB 10.1* 10.1*  HCT 34.7* 34.2*  MCV 79.4 79.9  PLT 180 176   Cardiac Enzymes: No results found for this basename: CKTOTAL:5,CKMB:5,CKMBINDEX:5,TROPONINI:5 in the last 168 hours BNP (last 3 results)  Basename 02/29/12 0505 02/26/12 0105 04/07/11 0934  PROBNP 8263.0* 8685.0* 205.0*   CBG:  Lab 03/05/12 1226 03/05/12 0800 03/04/12 2147 03/04/12 1654 03/04/12 1208  GLUCAP 118* 79 137* 309* 171*    Recent Results (from the past 240 hour(s))  URINE CULTURE     Status: Normal   Collection Time   02/26/12  1:28 AM      Component Value Range Status Comment   Specimen Description URINE, RANDOM   Final    Special Requests Normal   Final    Culture  Setup Time 02/26/2012 04:22   Final    Colony Count >=100,000 COLONIES/ML   Final    Culture     Final    Value: ESCHERICHIA COLI     Note: Two isolates with different morphologies were identified as the same organism.The most resistant organism was reported.   Report Status 02/29/2012 FINAL   Final    Organism ID, Bacteria ESCHERICHIA COLI   Final   BODY FLUID CULTURE     Status: Normal   Collection Time   03/01/12  2:09 PM      Component  Value Range Status Comment   Specimen Description PLEURAL RIGHT EFFUSION   Final    Special Requests Normal   Final    Gram Stain     Final    Value: RARE WBC PRESENT, PREDOMINANTLY PMN     NO ORGANISMS SEEN   Culture NO GROWTH 3 DAYS   Final    Report Status 03/05/2012 FINAL   Final   URINE CULTURE     Status: Normal   Collection Time   03/02/12  3:14 PM      Component Value Range Status Comment   Specimen Description URINE, CLEAN CATCH   Final    Special Requests NONE   Final    Culture  Setup Time 03/03/2012 01:27  Final    Colony Count NO GROWTH   Final    Culture NO GROWTH   Final    Report Status 03/04/2012 FINAL   Final      Studies: No results found.  Scheduled Meds:   . budesonide-formoterol  2 puff Inhalation BID  . bumetanide (BUMEX) IV  2 mg Intravenous Daily  . enoxaparin (LOVENOX) injection  30 mg Subcutaneous Q24H  . gabapentin  100 mg Oral TID  . hydrocerin   Topical BID  . insulin aspart  0-9 Units Subcutaneous TID WC  . insulin glargine  5 Units Subcutaneous Daily  . metoprolol succinate  50 mg Oral QHS  . pantoprazole  40 mg Oral Daily  . polyethylene glycol  17 g Oral Daily  . senna-docusate  1 tablet Oral BID  . simvastatin  40 mg Oral QHS  . sodium chloride  3 mL Intravenous Q12H  . Tamsulosin HCl  0.4 mg Oral QPC supper  . tiotropium  18 mcg Inhalation Daily   Continuous Infusions:   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 Type II  CORONARY ARTERY DISEASE  Atrial fibrillation  COPD  Hypokalemia  Hypoglycemia  Acute on chronic combined systolic and diastolic CHF (congestive heart failure)  UTI (lower urinary tract infection)  Stage III chronic kidney disease  Lower extremity edema  Microcytic anemia  Hyperkalemia  Pleural effusion, right  Urinary retention with incomplete bladder emptying  Acute on chronic renal failure    Time spent: 25 minutes.     Areana Kosanke  [facility] Pager [phone]. If 8PM-8AM, please contact night-coverage at [email or web address], password [facility] 03/05/2012, 3:52 PM  LOS: 9 days

## 2012-03-05 NOTE — Progress Notes (Signed)
Patient ID: [name], male   DOB: May 25, 1945, 66 y.o.   MRN: [id number]   [facility] Progress Note    Subjective:   Reports no change in respiratory status. He is mentating without problems, not lethargic or somnolent and without emerging N/V/Dysgeusia   Objective:   BP 102/63  Pulse 73  Temp 98.1 F (36.7 C) (Oral)  Resp 20  Ht 5\' 5"  (1.651 m)  Wt 77.747 kg (171 lb 6.4 oz)  BMI 28.52 kg/m2  SpO2 100%  Intake/Output Summary (Last 24 hours) at 03/05/12 1034 Last data filed at 03/05/12 0537  Gross per 24 hour  Intake    290 ml  Output    525 ml  Net   -235 ml   Weight change: 0.547 kg (1 lb 3.3 oz)  Physical Exam: Gen: Appears to be comfortable sitting up in his bed [id number] RRR, normal S1 and S2  Resp:Coarse BS with decreased BS over bases bilaterally HYQ:MVHQ, obese, NT, BS normal Ext:3+ LE edema with diffuse focal scabs/excoriations. Asterixis is present  Imaging: No results found.  Labs: BMET  Lab 03/05/12 0524 03/04/12 0447 03/03/12 1555 03/03/12 0435 03/02/12 0508 03/01/12 0510 02/29/12 0505  NA 127* 126* 129* 129* 132* 131* 132*  K 4.7 5.5* 5.5* 5.5* 4.6 4.5 5.6*  CL 84* 85* 87* 89* 88* 90* 91*  CO2 31 30 33* 31 35* 32 35*  GLUCOSE 73 200* 170* 123* 151* 183* 97  BUN 104* 101* 97* 94* 84* 77* 69*  CREATININE 3.05* 3.11* 3.11* 2.83* 2.46* 2.29* 2.39*  ALB -- -- -- -- -- -- --  CALCIUM 8.1* 8.2* 8.2* 8.0* 8.7 8.5 8.6  PHOS 6.9* 6.3* 6.2* 6.1* -- -- --   CBC  Lab 03/05/12 0524 03/01/12 0510  WBC 7.2 7.3  NEUTROABS -- --  HGB 10.1* 10.1*  HCT 34.7* 34.2*  MCV 79.4 79.9  PLT 180 176    Medications:      . albumin human  12.5 g Intravenous BID  . budesonide-formoterol  2 puff Inhalation BID  . enoxaparin (LOVENOX) injection  30 mg Subcutaneous Q24H  . furosemide  60 mg Intravenous BID  . gabapentin  100 mg Oral TID  . hydrocerin   Topical BID  . insulin aspart  0-9 Units Subcutaneous TID WC  . insulin glargine  5 Units  Subcutaneous Daily  . metoprolol succinate  50 mg Oral QHS  . pantoprazole  40 mg Oral Daily  . polyethylene glycol  17 g Oral Daily  . senna-docusate  1 tablet Oral BID  . simvastatin  40 mg Oral QHS  . sodium chloride  3 mL Intravenous Q12H  . Tamsulosin HCl  0.4 mg Oral QPC supper  . tiotropium  18 mcg Inhalation Daily     Assessment/ Plan:   1. Acute renal failure chronic kidney disease stage III.: From history, timeline of events and available database-it appears that Mr. Mcgreal has baseline chronic kidney disease stage III from underlying diabetes, hypertension and chronic cardiorenal syndrome. His most recent acute renal failure seems to be from acute CHF decompensation in hemodynamically mediated injury to the kidney further compounded by RAS activation with what appears to be evolution to ATN. Urine output remains marginal. Given the presence of edema and current respiratory status- will continue diuretics but switch from furosemide to torsemide with hopes of better success from pharmacokinetics. He appears to have some early uremic findings at this time- will attempt decrease in diuretic dose (40mg   torsemide=80mg  furosemide instead of 60BID). If continues to worsen- will need dialysis 2. Hyperkalemia: Improved s/p kayexalate.  3. Hyponatremia: Secondary to metolazone versus CHF decompensation. Metolazone stopped and now on fluid restriction.  4. Pleural effusion/pericardial effusion: Reflection of his volume excessive state/CHF-status post diagnostic versus therapeutic thoracentesis. Does not have evident tamponade physiology to warrant pericardiocentesis.  5. Hypoalbuminemia: Suspect chronic malnutrition versus hepatic synthetic defect with passive hepatic congestion.   Zetta Bills, MD 03/05/2012, 10:34 AM

## 2012-03-05 NOTE — Progress Notes (Signed)
    Patient ID: [name], male   DOB: 11-Dec-1945, 66 y.o.   MRN: [id number]    Subjective:  Mild dyspnea; improved since admission. No chest pain  Objective:  Filed Vitals:   03/04/12 1120 03/04/12 1950 03/04/12 2008 03/05/12 0533  BP: 96/64 104/68 113/62 98/59  Pulse: 86 83 83 79  Temp: 97.3 F (36.3 C) 97.5 F (36.4 C) 97.4 F (36.3 C) 97.3 F (36.3 C)  TempSrc: Oral Oral Oral Oral  Resp:   20 20  Height:      Weight:    171 lb 6.4 oz (77.747 kg)  SpO2:  100% 100% 97%    Intake/Output from previous day:  Intake/Output Summary (Last 24 hours) at 03/05/12 0728 Last data filed at 03/05/12 0537  Gross per 24 hour  Intake    390 ml  Output    525 ml  Net   -135 ml    Physical Exam: Affect appropriate HEENT: normal Neck supple Lungs diminished BS throughout, worse at bases Heart:  RRR Abdomen: NT/ND Ext 3+bilateral edema with erythema and excoriations Neuro non-focal   Lab Results: Basic Metabolic Panel:  Basename 03/05/12 0524 03/04/12 1540 03/04/12 0447  NA 127* -- 126*  K 4.7 -- 5.5*  CL 84* -- 85*  CO2 31 -- 30  GLUCOSE 73 -- 200*  BUN 104* -- 101*  CREATININE 3.05* -- 3.11*  CALCIUM 8.1* -- 8.2*  MG 2.1 2.2 --  PHOS 6.9* -- 6.3*     Basename 03/05/12 0524  WBC 7.2  NEUTROABS --  HGB 10.1*  HCT 34.7*  MCV 79.4  PLT 180     Telemetry: Afib with V -pacing rate control good    Medications:      . albumin human  12.5 g Intravenous BID  . budesonide-formoterol  2 puff Inhalation BID  . enoxaparin (LOVENOX) injection  30 mg Subcutaneous Q24H  . furosemide  60 mg Intravenous BID  . gabapentin  100 mg Oral TID  . hydrocerin   Topical BID  . insulin aspart  0-9 Units Subcutaneous TID WC  . insulin glargine  5 Units Subcutaneous Daily  . metoprolol succinate  50 mg Oral QHS  . pantoprazole  40 mg Oral Daily  . polyethylene glycol  17 g Oral Daily  . senna-docusate  1 tablet Oral BID  . simvastatin  40 mg Oral QHS  . sodium  chloride  3 mL Intravenous Q12H  . Tamsulosin HCl  0.4 mg Oral QPC supper  . tiotropium  18 mcg Inhalation Daily       Assessment/Plan:  Acute diastolic CHF:  Continue current dose of lasix. CRF:  Nephrology following Afib:  Continue toprol for rate cont; coumadin on hold because of pericardial effusion. Pericardial effusion: not clinically in tamponade; plan repeat echo in 1-2 weeks. Chol:  Continue statin COPD: per primary care CAD: continue statin  Olga Millers 03/05/2012, 7:28 AM

## 2012-03-06 ENCOUNTER — Inpatient Hospital Stay (HOSPITAL_COMMUNITY): Payer: Medicare Other

## 2012-03-06 DIAGNOSIS — R609 Edema, unspecified: Secondary | ICD-10-CM

## 2012-03-06 DIAGNOSIS — J90 Pleural effusion, not elsewhere classified: Secondary | ICD-10-CM

## 2012-03-06 LAB — GLUCOSE, CAPILLARY: Glucose-Capillary: 95 mg/dL (ref 70–99)

## 2012-03-06 LAB — RENAL FUNCTION PANEL
Albumin: 3 g/dL — ABNORMAL LOW (ref 3.5–5.2)
GFR calc Af Amer: 21 mL/min — ABNORMAL LOW (ref >90)
GFR calc non Af Amer: 18 mL/min — ABNORMAL LOW (ref >90)
Phosphorus: 7 mg/dL — ABNORMAL HIGH (ref 2.3–4.6)
Potassium: 5 mEq/L (ref 3.5–5.1)
Sodium: 125 mEq/L — ABNORMAL LOW (ref 135–145)

## 2012-03-06 LAB — PROTIME-INR: INR: 1.77 — ABNORMAL HIGH (ref 0.00–1.49)

## 2012-03-06 MED ORDER — GUAIFENESIN 200 MG PO TABS
200.0000 mg | ORAL_TABLET | Freq: Four times a day (QID) | ORAL | Status: DC | PRN
  Administered 2012-03-06: 200 mg via ORAL
  Filled 2012-03-06: qty 1

## 2012-03-06 MED ORDER — VITAMINS A & D EX OINT
TOPICAL_OINTMENT | CUTANEOUS | Status: AC
  Administered 2012-03-06: 14:00:00
  Filled 2012-03-06: qty 10

## 2012-03-06 NOTE — Progress Notes (Signed)
Report given to [facility]. 

## 2012-03-06 NOTE — Progress Notes (Signed)
Discussed 1200 ml fluid restriction with patient.  No evidence of learning.

## 2012-03-06 NOTE — Progress Notes (Signed)
No chem (lab) result this am.

## 2012-03-06 NOTE — Progress Notes (Signed)
    Patient ID: [name], male   DOB: 1945/08/05, 66 y.o.   MRN: [id number]    Subjective:  Mild dyspnea; improved since admission. No chest pain; complains of left knee pain  Objective:  Filed Vitals:   03/05/12 2035 03/05/12 2207 03/06/12 0438 03/06/12 0500  BP:  104/61 90/55   Pulse:  84 87   Temp:  97.7 F (36.5 C) 98.8 F (37.1 C)   TempSrc:  Oral Oral   Resp: 18 18 18    Height:      Weight:    171 lb 15.3 oz (78 kg)  SpO2:  91% 93%     Intake/Output from previous day:  Intake/Output Summary (Last 24 hours) at 03/06/12 0811 Last data filed at 03/06/12 0346  Gross per 24 hour  Intake    560 ml  Output    750 ml  Net   -190 ml    Physical Exam: Affect appropriate; chronically ill appearing HEENT: normal Neck supple Lungs diminished BS throughout, worse at bases Heart:  RRR Abdomen: NT/ND Ext 3+bilateral edema with erythema and excoriations Neuro non-focal   Lab Results: Basic Metabolic Panel:  Basename 03/05/12 0524 03/04/12 1540 03/04/12 0447  NA 127* -- 126*  K 4.7 -- 5.5*  CL 84* -- 85*  CO2 31 -- 30  GLUCOSE 73 -- 200*  BUN 104* -- 101*  CREATININE 3.05* -- 3.11*  CALCIUM 8.1* -- 8.2*  MG 2.1 2.2 --  PHOS 6.9* -- 6.3*     Basename 03/05/12 0524  WBC 7.2  NEUTROABS --  HGB 10.1*  HCT 34.7*  MCV 79.4  PLT 180     Telemetry: Afib with V -pacing rate control good    Medications:      . budesonide-formoterol  2 puff Inhalation BID  . bumetanide (BUMEX) IV  2 mg Intravenous Daily  . enoxaparin (LOVENOX) injection  30 mg Subcutaneous Q24H  . gabapentin  100 mg Oral TID  . hydrocerin   Topical BID  . insulin aspart  0-9 Units Subcutaneous TID WC  . insulin glargine  5 Units Subcutaneous Daily  . metoprolol succinate  50 mg Oral QHS  . pantoprazole  40 mg Oral Daily  . polyethylene glycol  17 g Oral Daily  . senna-docusate  1 tablet Oral BID  . simvastatin  40 mg Oral QHS  . sodium chloride  3 mL Intravenous Q12H  .  Tamsulosin HCl  0.4 mg Oral QPC supper  . tiotropium  18 mcg Inhalation Daily       Assessment/Plan:  Acute diastolic CHF:  Diuretics being managed by nephrology CRF:  Nephrology following; may need dialysis Afib:  Continue toprol for rate cont; coumadin on hold because of pericardial effusion. Pericardial effusion: not clinically in tamponade; plan repeat echo in 1-2 weeks. If dialysis needed, may need pericardial window or pericardiocentesis due to hemodynamics associated with dialysis. Chol:  Continue statin COPD: per primary care CAD: continue statin  Olga Millers 03/06/2012, 8:11 AM

## 2012-03-06 NOTE — Progress Notes (Signed)
Patient ID: [name], male   DOB: [birth year], 66 y.o.   MRN: [id number]   [facility] Progress Note    Subjective:   Reports that he continues to feel "fair" and denies any significant changes overnight in breathing. His wife reports that he is "jerking" a little more than usual. Mentation is fine per patient/wife. Continues to disregard fluid restriction.   Objective:   BP 90/55  Pulse 87  Temp 98.8 F (37.1 C) (Oral)  Resp 18  Ht 5\' 5"  (1.651 m)  Wt 78 kg (171 lb 15.3 oz)  BMI 28.62 kg/m2  SpO2 96%  Intake/Output Summary (Last 24 hours) at 03/06/12 1143 Last data filed at 03/06/12 1056  Gross per 24 hour  Intake    570 ml  Output   1050 ml  Net   -480 ml   Weight change: 0.253 kg (8.9 oz)  Physical Exam: Gen: resting in bed- seems somnolent WUJ:WJXBJ RRR, normal S1 and S2  Resp: Fine bibasal rales, no rhonchi YNW:GNFA, obese, NT, BS normal Ext:3+ LE edema with healing scabs/excoriations. Asterixis is present.  Imaging: No results found.  Labs: BMET  Lab 03/06/12 1002 03/05/12 0524 03/04/12 0447 03/03/12 1555 03/03/12 0435 03/02/12 0508 03/01/12 0510  NA 125* 127* 126* 129* 129* 132* 131*  K 5.0 4.7 5.5* 5.5* 5.5* 4.6 4.5  CL 83* 84* 85* 87* 89* 88* 90*  CO2 30 31 30  33* 31 35* 32  GLUCOSE 102* 73 200* 170* 123* 151* 183*  BUN 114* 104* 101* 97* 94* 84* 77*  CREATININE 3.30* 3.05* 3.11* 3.11* 2.83* 2.46* 2.29*  ALB -- -- -- -- -- -- --  CALCIUM 8.1* 8.1* 8.2* 8.2* 8.0* 8.7 8.5  PHOS 7.0* 6.9* 6.3* 6.2* 6.1* -- --   CBC  Lab 03/05/12 0524 03/01/12 0510  WBC 7.2 7.3  NEUTROABS -- --  HGB 10.1* 10.1*  HCT 34.7* 34.2*  MCV 79.4 79.9  PLT 180 176    Medications:      . budesonide-formoterol  2 puff Inhalation BID  . bumetanide (BUMEX) IV  2 mg Intravenous Daily  . enoxaparin (LOVENOX) injection  30 mg Subcutaneous Q24H  . gabapentin  100 mg Oral TID  . hydrocerin   Topical BID  . insulin aspart  0-9 Units Subcutaneous TID WC   . insulin glargine  5 Units Subcutaneous Daily  . metoprolol succinate  50 mg Oral QHS  . pantoprazole  40 mg Oral Daily  . polyethylene glycol  17 g Oral Daily  . senna-docusate  1 tablet Oral BID  . simvastatin  40 mg Oral QHS  . sodium chloride  3 mL Intravenous Q12H  . Tamsulosin HCl  0.4 mg Oral QPC supper  . tiotropium  18 mcg Inhalation Daily     Assessment/ Plan:   1. Acute renal failure chronic kidney disease stage III.: From history, timeline of events and available database-it appears that [name] has baseline chronic kidney disease stage III from underlying diabetes, hypertension and chronic cardiorenal syndrome. ARF is from CHF exacerbation/acute cardiorenal decompensation. With worsening renal function and uremic symptoms- will have him transferred to [facility] to initiate HD within the next 24hrs. Will ask radiology to place HD catheter tomorrow. 2. Hyperkalemia: Improved s/p kayexalate- now rising again with PO intake and poor renal function.  3. Hyponatremia: Secondary to metolazone versus CHF decompensation. Not adhering to fluid restriction.  4. Pleural effusion/pericardial effusion: Reflection of his volume excessive state/CHF-status post diagnostic versus therapeutic  thoracentesis. May need a window or pericardiocentesis if has intra-HD hypotension.  5. Hypoalbuminemia: Suspect chronic malnutrition versus hepatic synthetic defect with passive hepatic congestion.   Zetta Bills, MD 03/06/2012, 11:43 AM

## 2012-03-06 NOTE — Progress Notes (Signed)
Gave report to nurse at [facility].

## 2012-03-06 NOTE — Progress Notes (Signed)
[facility] PROGRESS NOTE  [name] ZOX:[id number] DOB: Sep 24, 1945 DOA: 03/12/2012 PCP: [name], MD  Assessment/Plan: Very Pleasant 67 year old presents with SOB and lower extremities edema. He was diagnosed with Acute on chronic HF exacerbation. He was found to have pleural effusion and has moderate pericardial effusion with no physiologic tamponade. He develop acute on chronic renal failure. He now will require dialysis. Please see Cardiology note with relation to dialysis and pericardial effusion.    Acute on chronic combined systolic and diastolic CHF (congestive heart failure) / Small right pleural effusion  EF 50 to 55 % ECHO 03-01-2012.  Lasix change to BUMEX.  Repeat chest x-ray 02/29/2012 showed an enlarging effusion. Status post thoracentesis with 1.8 L of fluid removed on 12-10. culture no growth to date, cell count WBC 172, LDH 91, protein 2.9 and glucose156.  Appreciate cardiology assistance.   2-Pericardial effusion  No obvious tamponade physiology. Hold Coumadin in anticipation of possible need for pericardiocentesis or pericardial window.        Patient will need repeat ECHO in 1-2 weeks. I will discontinue prophylaxis dose Lovenox today 12-15 in case patient require pericardiocentesis.  3-Urinary retention  Patient had 708 cc in bladder on bladder scan 12-10. Foley catheter placed. No hydronephrosis on renal ultrasound.  Continue with Flomax.  Can d/c home with foley, outpatient urology followup.  Will need to make sure patient able to afford medications prior to discharge.  Neosporin cream to catheter area for irritation.  4-Microcytic anemia  Ferritin normal but iron low with low saturation ratios, consistent with AOCD.  HB at 10, stable.  5-Hypokalemia / hyperkalemia  Placed on oral replacement therapy. Stopped on 02/29/2012 and given a dose of Kayexalate for potassium of 5.6 02/29/2012. K level stable. 6-Skin wounds  Seen by wound care  nurse 02/28/2012. Benadryl cream when necessary for pruritus. Eucerin cream for maintenance of moisturization. 7-Lower extremity edema  Dopplers done. DVT ruled out. 8-DM w/o Complication Type II / hypoglycemia  Lantus discontinued on 02/28/2012 secondary to hypoglycemia. SSI.  CBG stable on low dose lantus.  9-Atrial fibrillation  Status post AV nodal ablation. Has pacemaker. Rate controlled, Coumadin on hold in case patient required procedure. Cardio recommend holding anticoagulation for at least 1 week.  10-COPD  Stable. Continue Symbicort and Spiriva. 11-E. Coli UTI (lower urinary tract infection)  Placed on empiric Rocephin, to which the organism is sensitive. Antibiotics changed to Cipro 02/29/12 based on sensitivity data.  Day 9/10 antibiotics. Treating for complicated UTI due to foley Catheter.  12-Stage III chronic kidney disease  Acute on chronic renal failure secondary to Heart failure exacerbation, hemodynamically mediated injury and evolution of ATN.  Cr baseline 1.6 to 1.9 prior records.  Patient will be transfer to [facility] for Dialysis treatment. Plan to start dialysis 12-16.  Appreciate Dr Allena Katz Assistance.  13-Constipation: Start Docusate, miralax. Had BM. 14-Cough: Check chest x ray. Add guaifenesin.  15-Abdominal pain : check KUB.   Code Status: Full.  Family Communication: Plan of Care discussed with patient.  Disposition Plan: Home when stable.   Consultants:  Cardiology, Dr. Eden Emms.  Dr Allena Katz, Nephrologist Procedures:  Thoracentesis 12-10.  Antibiotics:  Rocephin 02/26/2012--->02/29/12  Cipro 02/29/2012--->       HPI/Subjective: He relates breathing is better, relates coughing thick phlegm. He would like something for cough. Relates mild left side abdominal pain, when he cough. Passing gas. Had BM yesterday.   Objective: Filed Vitals:   03/05/12 2207 03/06/12 0438 03/06/12 0500  03/06/12 1038  BP: 104/61 90/55    Pulse: 84 87    Temp: 97.7 F (36.5  C) 98.8 F (37.1 C)    TempSrc: Oral Oral    Resp: 18 18    Height:      Weight:   78 kg (171 lb 15.3 oz)   SpO2: 91% 93%  96%    Intake/Output Summary (Last 24 hours) at 03/06/12 1358 Last data filed at 03/06/12 1056  Gross per 24 hour  Intake    340 ml  Output    800 ml  Net   -460 ml   Filed Weights   03/04/12 0500 03/05/12 0533 03/06/12 0500  Weight: 77.2 kg (170 lb 3.1 oz) 77.747 kg (171 lb 6.4 oz) 78 kg (171 lb 15.3 oz)    Exam:   General:  No distress, chronic ill appearing.   Cardiovascular: S 1, S 2 RRR  Respiratory: Crackles bases  Abdomen: Bs present, soft. Mild left side tenderness.   Extremities: significant edema.   Data Reviewed: Basic Metabolic Panel:  Lab 03/06/12 4098 03/05/12 0524 03/04/12 1540 03/04/12 0447 03/03/12 1555 03/03/12 0435  NA 125* 127* -- 126* 129* 129*  K 5.0 4.7 -- 5.5* 5.5* 5.5*  CL 83* 84* -- 85* 87* 89*  CO2 30 31 -- 30 33* 31  GLUCOSE 102* 73 -- 200* 170* 123*  BUN 114* 104* -- 101* 97* 94*  CREATININE 3.30* 3.05* -- 3.11* 3.11* 2.83*  CALCIUM 8.1* 8.1* -- 8.2* 8.2* 8.0*  MG -- 2.1 2.2 -- -- --  PHOS 7.0* 6.9* -- 6.3* 6.2* 6.1*   Liver Function Tests:  Lab 03/06/12 1002 03/05/12 0524 03/04/12 0447 03/03/12 1555 03/03/12 0435  AST -- -- -- -- --  ALT -- -- -- -- --  ALKPHOS -- -- -- -- --  BILITOT -- -- -- -- --  PROT -- -- -- -- --  ALBUMIN 3.0* 2.9* 2.8* 2.6* 2.5*  CBC:  Lab 03/05/12 0524 03/01/12 0510  WBC 7.2 7.3  NEUTROABS -- --  HGB 10.1* 10.1*  HCT 34.7* 34.2*  MCV 79.4 79.9  PLT 180 176   Cardiac Enzymes: No results found for this basename: CKTOTAL:5,CKMB:5,CKMBINDEX:5,TROPONINI:5 in the last 168 hours BNP (last 3 results)  Basename 02/29/12 0505 02/26/12 0105 04/07/11 0934  PROBNP 8263.0* 8685.0* 205.0*   CBG:  Lab 03/06/12 1207 03/06/12 0727 03/05/12 2205 03/05/12 1716 03/05/12 1226  GLUCAP 95 94 119* 124* 118*    Recent Results (from the past 240 hour(s))  URINE CULTURE     Status:  Normal   Collection Time   02/26/12  1:28 AM      Component Value Range Status Comment   Specimen Description URINE, RANDOM   Final    Special Requests Normal   Final    Culture  Setup Time 02/26/2012 04:22   Final    Colony Count >=100,000 COLONIES/ML   Final    Culture     Final    Value: ESCHERICHIA COLI     Note: Two isolates with different morphologies were identified as the same organism.The most resistant organism was reported.   Report Status 02/29/2012 FINAL   Final    Organism ID, Bacteria ESCHERICHIA COLI   Final   BODY FLUID CULTURE     Status: Normal   Collection Time   03/01/12  2:09 PM      Component Value Range Status Comment   Specimen Description PLEURAL RIGHT EFFUSION   Final  Special Requests Normal   Final    Gram Stain     Final    Value: RARE WBC PRESENT, PREDOMINANTLY PMN     NO ORGANISMS SEEN   Culture NO GROWTH 3 DAYS   Final    Report Status 03/05/2012 FINAL   Final   URINE CULTURE     Status: Normal   Collection Time   03/02/12  3:14 PM      Component Value Range Status Comment   Specimen Description URINE, CLEAN CATCH   Final    Special Requests NONE   Final    Culture  Setup Time 03/03/2012 01:27   Final    Colony Count NO GROWTH   Final    Culture NO GROWTH   Final    Report Status 03/04/2012 FINAL   Final      Studies: No results found.  Scheduled Meds:   . budesonide-formoterol  2 puff Inhalation BID  . bumetanide (BUMEX) IV  2 mg Intravenous Daily  . enoxaparin (LOVENOX) injection  30 mg Subcutaneous Q24H  . gabapentin  100 mg Oral TID  . hydrocerin   Topical BID  . insulin aspart  0-9 Units Subcutaneous TID WC  . insulin glargine  5 Units Subcutaneous Daily  . metoprolol succinate  50 mg Oral QHS  . pantoprazole  40 mg Oral Daily  . polyethylene glycol  17 g Oral Daily  . senna-docusate  1 tablet Oral BID  . simvastatin  40 mg Oral QHS  . sodium chloride  3 mL Intravenous Q12H  . Tamsulosin HCl  0.4 mg Oral QPC supper  .  tiotropium  18 mcg Inhalation Daily  . vitamin A & D       Continuous Infusions:   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 Type II  CORONARY ARTERY DISEASE  Atrial fibrillation  COPD  Hypokalemia  Hypoglycemia  Acute on chronic combined systolic and diastolic CHF (congestive heart failure)  UTI (lower urinary tract infection)  Stage III chronic kidney disease  Lower extremity edema  Microcytic anemia  Hyperkalemia  Pleural effusion, right  Urinary retention with incomplete bladder emptying  Acute on chronic renal failure    Time spent: 25 minutes    Kona Yusuf  [facility] Pager [phone]. If 8PM-8AM, please contact night-coverage at [email or web address], password [facility] 03/06/2012, 1:58 PM  LOS: 10 days

## 2012-03-07 ENCOUNTER — Inpatient Hospital Stay (HOSPITAL_COMMUNITY): Payer: Medicare Other

## 2012-03-07 DIAGNOSIS — N189 Chronic kidney disease, unspecified: Secondary | ICD-10-CM

## 2012-03-07 DIAGNOSIS — J441 Chronic obstructive pulmonary disease with (acute) exacerbation: Secondary | ICD-10-CM

## 2012-03-07 DIAGNOSIS — I5042 Chronic combined systolic (congestive) and diastolic (congestive) heart failure: Secondary | ICD-10-CM

## 2012-03-07 DIAGNOSIS — N179 Acute kidney failure, unspecified: Secondary | ICD-10-CM

## 2012-03-07 DIAGNOSIS — J209 Acute bronchitis, unspecified: Secondary | ICD-10-CM

## 2012-03-07 LAB — GLUCOSE, CAPILLARY
Glucose-Capillary: 131 mg/dL — ABNORMAL HIGH (ref 70–99)
Glucose-Capillary: 135 mg/dL — ABNORMAL HIGH (ref 70–99)

## 2012-03-07 LAB — PROTEIN ELECTROPHORESIS, SERUM
Beta Globulin: 5.8 % (ref 4.7–7.2)
M-Spike, %: NOT DETECTED g/dL
Total Protein ELP: 6.5 g/dL (ref 6.0–8.3)

## 2012-03-07 LAB — ALT: ALT: 11 U/L (ref 0–53)

## 2012-03-07 LAB — HEPATITIS B SURFACE ANTIGEN: Hepatitis B Surface Ag: NEGATIVE

## 2012-03-07 MED ORDER — CALCIUM ACETATE 667 MG PO CAPS
667.0000 mg | ORAL_CAPSULE | Freq: Three times a day (TID) | ORAL | Status: DC
  Administered 2012-03-07 – 2012-03-20 (×27): 667 mg via ORAL
  Filled 2012-03-07 (×41): qty 1

## 2012-03-07 MED ORDER — KIDNEY FAILURE BOOK
Freq: Once | Status: AC
  Administered 2012-03-07: 22:00:00
  Filled 2012-03-07: qty 1

## 2012-03-07 MED ORDER — HEPARIN SODIUM (PORCINE) 1000 UNIT/ML IJ SOLN
1600.0000 [IU] | Freq: Once | INTRAMUSCULAR | Status: AC
  Administered 2012-03-07: 1600 [IU] via INTRAVENOUS

## 2012-03-07 MED ORDER — HEPARIN SODIUM (PORCINE) 1000 UNIT/ML IJ SOLN
INTRAMUSCULAR | Status: AC
  Administered 2012-03-07: 1600 [IU] via INTRAVENOUS
  Filled 2012-03-07: qty 1

## 2012-03-07 NOTE — Progress Notes (Signed)
Subjective:  Has new right IJ triple lumen HD cath Ready for first treatment Breathing "a little better" Mentally seems pretty clear Wife in the room with him Once again went over his fluid restriction Foley in place with about a liter out yesterday - looks quite concentratred Objective Vital signs in last 24 hours: Filed Vitals:   03/06/12 1958 03/07/12 0425 03/07/12 0809 03/07/12 0940  BP: 99/63 110/64  121/61  Pulse: 85 79  89  Temp: 98 F (36.7 C) 97.7 F (36.5 C)    TempSrc: Oral Axillary    Resp: 18 18  18   Height: 5\' 5"  (1.651 m)     Weight: 78 kg (171 lb 15.3 oz)     SpO2: 98% 95% 92% 94%   Weight change: 0 kg (0 lb)  Intake/Output Summary (Last 24 hours) at 03/07/12 1350 Last data filed at 03/07/12 0900  Gross per 24 hour  Intake    660 ml  Output   1100 ml  Net   -440 ml   Weights 03/06/12 1958 78 kg (171 lb 15.3 oz) 1.89 sq meters 28.7 LS 03/06/12 0500 78 kg (171 lb 15.3 oz) -- -- KF  03/05/12 0533 77.747 kg (171 lb 6.4 oz) -- -- DR  03/04/12 0500 77.2 kg (170 lb 3.1 oz) -- -- IO  03/03/12 0500 74.481 kg (164 lb 3.2 oz) -- -- SL  03/02/12 0500 73 kg (160 lb 15 oz) -- -- JR  03/01/12 0456 73 kg (160 lb 15 oz) -- -- CM  02/29/12 0538 75.6 kg (166 lb 10.7 oz) -- -- CM  02/27/12 0527 73.7 kg (162 lb 7.7 oz) -- -- AR  02/26/12 0555 72.2 kg (159   Physical Exam:  Blood pressure 121/61, pulse 89, temperature 97.7 F (36.5 C), temperature source Axillary, resp. rate 18, height 5\' 5"  (1.651 m), weight 78 kg (171 lb 15.3 oz), SpO2 94.00%. Nice gray haired gentleman Right IJ HD cath in place Few basilar crackles No wheezes  BS seem diminished overall Abdomen soft, no focal tenderness 3-4+ edema pitting of both lower extremities  Labs: Basic Metabolic Panel:  Lab 03/06/12 1610 03/05/12 0524 03/04/12 0447 03/03/12 1555 03/03/12 0435 03/02/12 0508 03/01/12 0510  NA 125* 127* 126* 129* 129* 132* 131*  K 5.0 4.7 5.5* 5.5* 5.5* 4.6 4.5  CL 83* 84* 85* 87* 89* 88*  90*  CO2 30 31 30  33* 31 35* 32  GLUCOSE 102* 73 200* 170* 123* 151* 183*  BUN 114* 104* 101* 97* 94* 84* 77*  CREATININE 3.30* 3.05* 3.11* 3.11* 2.83* 2.46* 2.29*  ALB -- -- -- -- -- -- --  CALCIUM 8.1* 8.1* 8.2* 8.2* 8.0* 8.7 8.5  PHOS 7.0* 6.9* 6.3* 6.2* 6.1* -- --   Liver Function Tests:  Lab 03/06/12 1002 03/05/12 0524 03/04/12 0447  AST -- -- --  ALT -- -- --  ALKPHOS -- -- --  BILITOT -- -- --  PROT -- -- --  ALBUMIN 3.0* 2.9* 2.8*   CBC:  Lab 03/05/12 0524 03/01/12 0510  WBC 7.2 7.3  NEUTROABS -- --  HGB 10.1* 10.1*  HCT 34.7* 34.2*  MCV 79.4 79.9  PLT 180 176   Lab 03/07/12 1156 03/07/12 0748 03/06/12 2156 03/06/12 1827 03/06/12 1207  GLUCAP 131* 169* 102* 106* 95   Studies/Results: Dg Abd 1 View  03/06/2012  *RADIOLOGY REPORT*  Clinical Data: Left-sided abdominal pain for 2 days.  ABDOMEN - 1 VIEW  Comparison: Radiographs dated 05/03/2009  Findings: There are no dilated loops of large or small bowel. There is marked increased density at the right lung base since the prior chest x-ray of 03/01/2012.  The possibility of a acute process in the right lung should be considered.  Osseous structures are normal.  There is a tiny calcification in the region of the right renal pelvis which is felt to be vascular in origin as compared to prior CT scan dated 05/04/2009.  IMPRESSION: Benign-appearing abdomen.  There appears to be complete opacification of the right lung base since the prior chest x-ray of 05/03/2011.  I recommend a chest x- ray for further evaluation.   Original Report Authenticated By: Francene Boyers, M.D.    Medications:      . budesonide-formoterol  2 puff Inhalation BID  . bumetanide (BUMEX) IV  2 mg Intravenous Daily  . gabapentin  100 mg Oral TID  . heparin      . hydrocerin   Topical BID  . insulin aspart  0-9 Units Subcutaneous TID WC  . insulin glargine  5 Units Subcutaneous Daily  . metoprolol succinate  50 mg Oral QHS  . pantoprazole  40 mg  Oral Daily  . polyethylene glycol  17 g Oral Daily  . senna-docusate  1 tablet Oral BID  . simvastatin  40 mg Oral QHS  . sodium chloride  3 mL Intravenous Q12H  . Tamsulosin HCl  0.4 mg Oral QPC supper  . tiotropium  18 mcg Inhalation Daily    I  have reviewed scheduled and prn medications.  ASSESSMENT/RECOMMENDATIONS:  1. Acute renal failure on chronic kidney disease stage III.:  From history, timeline of events and available database-it appears that [name] has baseline chronic kidney disease stage III (baseline 1.7-1.9) from underlying diabetes, hypertension and chronic cardiorenal syndrome.  Right heart issues significant contributor ARF is from CHF exacerbation/acute cardiorenal decompensation. With worsening renal function and uremic symptoms  Will start HD today - temp cath was placed by IR (12/16) - will write orders for today and tomorrow Stop Bumex for now Will take several treatments to get fluid down - has gained 6 kg just since his  hospital admission! Watch for hypotension with pericardial effusion  2. Hyperkalemia: Improved s/p kayexalate Dialysis will help  3. Hyponatremia:  Secondary to metolazone (stopped)  versus CHF decompensation/dilutional component Not adhering to fluid restriction.   4. Pleural effusion and pericardial effusion:  Reflection of his volume excessive state/CHF Status post diagnostic versus therapeutic thoracentesis.(1.8 liters 12/10)  May need a window or pericardiocentesis if has intra-HD hypotension.  No clinical evidence of tamponade as of yet  5. Hypoalbuminemia: Suspect chronic malnutrition versus hepatic synthetic defect with passive hepatic congestion.   6. Ischemic cardiomyopathy with systolic and diastolic heart failure; EF 55% by echo 03/01/12; RV dysfunction/severe TR by echo as well  7. CAD with prior stenting (2006)  8. H/O AFIB with prior AV nodal ablation  9. H/O pacemaker [name])  10. COPD ([name])  11. BPH with  history of urinary retentionhas foley in ([name])  12. 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

## 2012-03-07 NOTE — Procedures (Signed)
I have personally attended this patient's dialysis session.  Goal UF is 3 liters and BP thus far remains stable in the 120's with BFR of 200 via right IJ temp cath.  Denny Lave B

## 2012-03-07 NOTE — Progress Notes (Signed)
[facility] PROGRESS NOTE  [name] ZOX:[id number] DOB: [birth year] DOA: 03/18/2012 PCP: [name], MD  Assessment/Plan: Very Pleasant 66 year old presents with SOB and lower extremities edema. He was diagnosed with Acute on chronic HF exacerbation. He was found to have pleural effusion and has moderate pericardial effusion with no physiologic tamponade. He develop acute on chronic renal failure. He now will require dialysis. Please see Cardiology note with relation to dialysis and pericardial effusion.    Acute on chronic combined systolic and diastolic CHF (congestive heart failure) / Small right pleural effusion  EF 50 to 55 % ECHO 03-01-2012.  Lasix change to BUMEX.  Repeat chest x-ray 02/29/2012 showed an enlarging effusion. Status post thoracentesis with 1.8 L of fluid removed on 12-10. culture no growth to date, cell count WBC 172, LDH 91, protein 2.9 and glucose156. Called and added cytology evaluation of pleural fluid. Appreciate cardiology assistance.  Pericardial effusion  No obvious tamponade physiology. Hold Coumadin in anticipation of possible need for pericardiocentesis or pericardial window.        Patient will need repeat ECHO in 1-2 weeks. Prophylaxis dose Lovenox discontinued 12-15 in case patient require pericardiocentesis.  3-Urinary retention  Patient had 708 cc in bladder on bladder scan 12-10. Foley catheter placed. No hydronephrosis on renal ultrasound.  Continue with Flomax.  Can d/c home with foley, outpatient urology followup.  Will need to make sure patient able to afford medications prior to discharge.  Neosporin cream to catheter area for irritation.   4-Microcytic anemia  Ferritin normal but iron low with low saturation ratios, consistent with AOCD.  HB at 10, stable.   5-Hypokalemia / hyperkalemia  Placed on oral replacement therapy. Stopped on 02/29/2012 and given a dose of Kayexalate for potassium of 5.6 02/29/2012. K level  stable.  6-Skin wounds  Seen by wound care nurse 02/28/2012. Benadryl cream when necessary for pruritus. Eucerin cream for maintenance of moisturization.  7-Lower extremity edema  Dopplers done. DVT ruled out.  8-DM w/o Complication Type II / hypoglycemia  Lantus discontinued on 02/28/2012 secondary to hypoglycemia. SSI.  CBG stable on low dose lantus   9-Atrial fibrillation  Status post AV nodal ablation. Has pacemaker. Rate controlled, Coumadin on hold in case patient required procedure. Cardio recommend holding anticoagulation for at least 1 week.   10-COPD  Stable. Continue Symbicort and Spiriva.  11-E. Coli UTI (lower urinary tract infection)  Placed on empiric Rocephin, to which the organism is sensitive. Antibiotics changed to Cipro 02/29/12 based on sensitivity data.  Day 9/10 antibiotics. Treating for complicated UTI due to foley Catheter.   12-Stage III chronic kidney disease  Acute on chronic renal failure secondary to Heart failure exacerbation, hemodynamically mediated injury and evolution of ATN.  Cr baseline 1.6 to 1.9 prior records.  Patient currently at [facility] for Dialysis treatment. Plan to start dialysis 12-16 and temp cath placed 03/07/12  Appreciate Dr Allena Katz Assistance.   13-Constipation: Start Docusate, miralax. Had BM. 14-Cough: Check chest x ray performed and showed stable cardiac enlargement and pulmonary edema compatible with CHF, stable BL pleural effusions R> L  15-Abdominal pain : check KUB.   Code Status: Full.  Family Communication: no family at bedside. Disposition Plan: Home when stable.   Consultants:  Cardiology, Dr. Eden Emms.  Dr Allena Katz, Nephrologist Procedures:  Thoracentesis 12-10.  Antibiotics:  Rocephin 02/26/2012--->02/29/12  Cipro 02/29/2012--->    HPI/Subjective: Patient has no new complaints today.  He denies any chest pain SOB. Is aware that  he will start dialysis sessions soon.    Objective: Filed Vitals:   03/07/12 0940  03/07/12 1400 03/07/12 1700 03/07/12 1707  BP: 121/61 100/69 115/72 122/80  Pulse: 89 80 84 80  Temp:  98 F (36.7 C) 97.5 F (36.4 C)   TempSrc:  Oral Oral   Resp: 18 16 21    Height:      Weight:   78.2 kg (172 lb 6.4 oz)   SpO2: 94% 90% 91% 98%    Intake/Output Summary (Last 24 hours) at 03/07/12 1723 Last data filed at 03/07/12 1300  Gross per 24 hour  Intake    900 ml  Output    900 ml  Net      0 ml   Filed Weights   03/06/12 0500 03/06/12 1958 03/07/12 1700  Weight: 78 kg (171 lb 15.3 oz) 78 kg (171 lb 15.3 oz) 78.2 kg (172 lb 6.4 oz)    Exam:   General:  No distress, chronic ill appearing.   Cardiovascular: S 1, S 2 RRR  Respiratory: decreased breath sounds, no wheezes  Abdomen: Bs present, soft. nontender  Extremities: significant edema.   Data Reviewed: Basic Metabolic Panel:  Lab 03/06/12 1610 03/05/12 0524 03/04/12 1540 03/04/12 0447 03/03/12 1555 03/03/12 0435  NA 125* 127* -- 126* 129* 129*  K 5.0 4.7 -- 5.5* 5.5* 5.5*  CL 83* 84* -- 85* 87* 89*  CO2 30 31 -- 30 33* 31  GLUCOSE 102* 73 -- 200* 170* 123*  BUN 114* 104* -- 101* 97* 94*  CREATININE 3.30* 3.05* -- 3.11* 3.11* 2.83*  CALCIUM 8.1* 8.1* -- 8.2* 8.2* 8.0*  MG -- 2.1 2.2 -- -- --  PHOS 7.0* 6.9* -- 6.3* 6.2* 6.1*   Liver Function Tests:  Lab 03/06/12 1002 03/05/12 0524 03/04/12 0447 03/03/12 1555 03/03/12 0435  AST -- -- -- -- --  ALT -- -- -- -- --  ALKPHOS -- -- -- -- --  BILITOT -- -- -- -- --  PROT -- -- -- -- --  ALBUMIN 3.0* 2.9* 2.8* 2.6* 2.5*  CBC:  Lab 03/05/12 0524 03/01/12 0510  WBC 7.2 7.3  NEUTROABS -- --  HGB 10.1* 10.1*  HCT 34.7* 34.2*  MCV 79.4 79.9  PLT 180 176   Cardiac Enzymes: No results found for this basename: CKTOTAL:5,CKMB:5,CKMBINDEX:5,TROPONINI:5 in the last 168 hours BNP (last 3 results)  Basename 02/29/12 0505 02/26/12 0105 04/07/11 0934  PROBNP 8263.0* 8685.0* 205.0*   CBG:  Lab 03/07/12 1156 03/07/12 0748 03/06/12 2156 03/06/12 1827  03/06/12 1207  GLUCAP 131* 169* 102* 106* 95    Recent Results (from the past 240 hour(s))  BODY FLUID CULTURE     Status: Normal   Collection Time   03/01/12  2:09 PM      Component Value Range Status Comment   Specimen Description PLEURAL RIGHT EFFUSION   Final    Special Requests Normal   Final    Gram Stain     Final    Value: RARE WBC PRESENT, PREDOMINANTLY PMN     NO ORGANISMS SEEN   Culture NO GROWTH 3 DAYS   Final    Report Status 03/05/2012 FINAL   Final   URINE CULTURE     Status: Normal   Collection Time   03/02/12  3:14 PM      Component Value Range Status Comment   Specimen Description URINE, CLEAN CATCH   Final    Special Requests NONE   Final  Culture  Setup Time 03/03/2012 01:27   Final    Colony Count NO GROWTH   Final    Culture NO GROWTH   Final    Report Status 03/04/2012 FINAL   Final      Studies: Dg Chest 2 View  03/07/2012  *RADIOLOGY REPORT*  Clinical Data: Pleural effusion and cough.  CHEST - 2 VIEW  Comparison: Two-view chest 03/06/2012.  Findings: The heart is enlarged.  Pacing wires are in place.  This interstitial edema is stable.  Bilateral pleural effusions are stable, right greater than left.  Associated airspace disease is evident.  The visualized soft tissues and bony thorax are unremarkable.  IMPRESSION:  1.  Stable cardiac enlargement and pulmonary edema compatible with congestive heart failure. 2.  Stable bilateral pleural effusions, right greater than left.   Original Report Authenticated By: Marin Roberts, M.D.    Dg Abd 1 View  03/06/2012  *RADIOLOGY REPORT*  Clinical Data: Left-sided abdominal pain for 2 days.  ABDOMEN - 1 VIEW  Comparison: Radiographs dated 05/03/2009  Findings: There are no dilated loops of large or small bowel. There is marked increased density at the right lung base since the prior chest x-ray of 03/01/2012.  The possibility of a acute process in the right lung should be considered.  Osseous structures are  normal.  There is a tiny calcification in the region of the right renal pelvis which is felt to be vascular in origin as compared to prior CT scan dated 05/04/2009.  IMPRESSION: Benign-appearing abdomen.  There appears to be complete opacification of the right lung base since the prior chest x-ray of 05/03/2011.  I recommend a chest x- ray for further evaluation.   Original Report Authenticated By: Francene Boyers, M.D.     Scheduled Meds:    . budesonide-formoterol  2 puff Inhalation BID  . calcium acetate  667 mg Oral TID WC  . gabapentin  100 mg Oral TID  . heparin      . hydrocerin   Topical BID  . insulin aspart  0-9 Units Subcutaneous TID WC  . insulin glargine  5 Units Subcutaneous Daily  . metoprolol succinate  50 mg Oral QHS  . pantoprazole  40 mg Oral Daily  . polyethylene glycol  17 g Oral Daily  . senna-docusate  1 tablet Oral BID  . simvastatin  40 mg Oral QHS  . sodium chloride  3 mL Intravenous Q12H  . Tamsulosin HCl  0.4 mg Oral QPC supper  . tiotropium  18 mcg Inhalation Daily   Continuous Infusions:   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 Type II  CORONARY ARTERY DISEASE  Atrial fibrillation  COPD  Hypokalemia  Hypoglycemia  Acute on chronic combined systolic and diastolic CHF (congestive heart failure)  UTI (lower urinary tract infection)  Stage III chronic kidney disease  Lower extremity edema  Microcytic anemia  Hyperkalemia  Pleural effusion, right  Urinary retention with incomplete bladder emptying  Acute on chronic renal failure    Time spent: 25 minutes    Penny Pia  [facility] Pager [phone]. If 8PM-8AM, please contact night-coverage at [email or web address], password [facility] 03/07/2012, 5:23 PM  LOS: 11 days

## 2012-03-07 NOTE — Procedures (Signed)
Placement of right jugular Trialysis catheter.  Tip at SVC/RA junction.  Catheter is ready to use.

## 2012-03-07 NOTE — Progress Notes (Signed)
Patient ID: [name], male   DOB: 1946/02/20, 66 y.o.   MRN: [id number]  Subjective:  Mild dyspnea; improved since admission. No chest pain  Objective:  Filed Vitals:   03/06/12 1542 03/06/12 1958 03/07/12 0425 03/07/12 0809  BP: 104/58 99/63 110/64   Pulse: 85 85 79   Temp: 97.3 F (36.3 C) 98 F (36.7 C) 97.7 F (36.5 C)   TempSrc: Oral Oral Axillary   Resp:  18 18   Height:  5\' 5"  (1.651 m)    Weight:  171 lb 15.3 oz (78 kg)    SpO2: 97% 98% 95% 92%    Intake/Output from previous day:  Intake/Output Summary (Last 24 hours) at 03/07/12 0856 Last data filed at 03/07/12 0600  Gross per 24 hour  Intake    880 ml  Output   1400 ml  Net   -520 ml    Physical Exam: Affect appropriate; chronically ill appearing HEENT: normal Neck supple Lungs diminished BS throughout, worse at bases and half way up on the right Heart:  RRR Abdomen: NT/ND Ext 3+bilateral edema with erythema and excoriations Neuro non-focal   Lab Results: Basic Metabolic Panel:  Basename 03/06/12 1002 03/05/12 0524 03/04/12 1540  NA 125* 127* --  K 5.0 4.7 --  CL 83* 84* --  CO2 30 31 --  GLUCOSE 102* 73 --  BUN 114* 104* --  CREATININE 3.30* 3.05* --  CALCIUM 8.1* 8.1* --  MG -- 2.1 2.2  PHOS 7.0* 6.9* --     Basename 03/05/12 0524  WBC 7.2  NEUTROABS --  HGB 10.1*  HCT 34.7*  MCV 79.4  PLT 180     Telemetry: Afib with V -pacing rate control good    Medications:      . budesonide-formoterol  2 puff Inhalation BID  . bumetanide (BUMEX) IV  2 mg Intravenous Daily  . gabapentin  100 mg Oral TID  . hydrocerin   Topical BID  . insulin aspart  0-9 Units Subcutaneous TID WC  . insulin glargine  5 Units Subcutaneous Daily  . metoprolol succinate  50 mg Oral QHS  . pantoprazole  40 mg Oral Daily  . polyethylene glycol  17 g Oral Daily  . senna-docusate  1 tablet Oral BID  . simvastatin  40 mg Oral QHS  . sodium chloride  3 mL Intravenous Q12H  . Tamsulosin HCl  0.4 mg Oral  QPC supper  . tiotropium  18 mcg Inhalation Daily       Assessment/Plan:  1. Acute diastolic CHF:  Diuretics being managed by nephrology 2. CRF:  Nephrology following; may need dialysis 3. Afib:  Continue toprol for rate cont; coumadin on hold because of pericardial effusion. 4. Pleural/Pericardial effusion: not clinically in tamponade; plan repeat echo in 1-2 weeks. If dialysis needed, may need pericardial window or pericardiocentesis due to hemodynamics associated with dialysis. Is he a candidate for chest tube? 5. Chol:  Continue statinCOPD: per primary care 6. CAD: continue statin 7. Weight loss - he looks like he is severely malnourished. He has lost over 50 lbs. Do we know that there is not a malignancy in his pleural fluid.  Lewayne Bunting, M.D. 03/07/2012, 8:56 AM

## 2012-03-07 NOTE — Progress Notes (Signed)
BP 110/64  Pulse 79  Temp 97.7 F (36.5 C) (Axillary)  Resp 18  Ht 5\' 5"  (1.651 m)  Wt 171 lb 15.3 oz (78 kg)  BMI 28.62 kg/m2  SpO2 92% Request for temporary dialysis catheter placement due to ARF and Acute diastolic CHF. Had thoracentesis (1.8L) last week at [facility], fluid was sent to lab but cytology was not ordered.  Discussed procedure of temp HD catheter placement including risks, complications. Labs, meds, allergies reviewed Consent signed in chart  Allayne Butcher 03/07/2012 9:31 AM

## 2012-03-08 ENCOUNTER — Encounter: Payer: Medicare Other | Admitting: Internal Medicine

## 2012-03-08 ENCOUNTER — Encounter (HOSPITAL_COMMUNITY): Payer: Medicare Other

## 2012-03-08 ENCOUNTER — Inpatient Hospital Stay (HOSPITAL_COMMUNITY): Payer: Medicare Other

## 2012-03-08 DIAGNOSIS — E875 Hyperkalemia: Secondary | ICD-10-CM

## 2012-03-08 DIAGNOSIS — E876 Hypokalemia: Secondary | ICD-10-CM

## 2012-03-08 LAB — PROTIME-INR
INR: 1.63 — ABNORMAL HIGH (ref 0.00–1.49)
Prothrombin Time: 18.8 seconds — ABNORMAL HIGH (ref 11.6–15.2)

## 2012-03-08 LAB — RENAL FUNCTION PANEL
Albumin: 2.9 g/dL — ABNORMAL LOW (ref 3.5–5.2)
CO2: 30 mEq/L (ref 19–32)
Calcium: 8.4 mg/dL (ref 8.4–10.5)
Creatinine, Ser: 2.87 mg/dL — ABNORMAL HIGH (ref 0.50–1.35)
GFR calc Af Amer: 25 mL/min — ABNORMAL LOW (ref >90)
GFR calc non Af Amer: 21 mL/min — ABNORMAL LOW (ref >90)
Phosphorus: 6.3 mg/dL — ABNORMAL HIGH (ref 2.3–4.6)
Sodium: 130 mEq/L — ABNORMAL LOW (ref 135–145)

## 2012-03-08 LAB — GLUCOSE, CAPILLARY
Glucose-Capillary: 139 mg/dL — ABNORMAL HIGH (ref 70–99)
Glucose-Capillary: 146 mg/dL — ABNORMAL HIGH (ref 70–99)

## 2012-03-08 LAB — CBC
MCH: 24 pg — ABNORMAL LOW (ref 26.0–34.0)
MCV: 77.7 fL — ABNORMAL LOW (ref 78.0–100.0)
Platelets: 195 10*3/uL (ref 150–400)
RBC: 4.17 MIL/uL — ABNORMAL LOW (ref 4.22–5.81)
RDW: 18.6 % — ABNORMAL HIGH (ref 11.5–15.5)

## 2012-03-08 LAB — IRON AND TIBC
Saturation Ratios: 8 % — ABNORMAL LOW (ref 20–55)
TIBC: 266 ug/dL (ref 215–435)

## 2012-03-08 MED ORDER — DOXERCALCIFEROL 4 MCG/2ML IV SOLN
1.0000 ug | INTRAVENOUS | Status: DC
  Administered 2012-03-09 – 2012-03-21 (×4): 1 ug via INTRAVENOUS
  Filled 2012-03-08 (×9): qty 2

## 2012-03-08 MED ORDER — DARBEPOETIN ALFA-POLYSORBATE 40 MCG/0.4ML IJ SOLN
40.0000 ug | INTRAMUSCULAR | Status: DC
  Administered 2012-03-09: 40 ug via INTRAVENOUS
  Filled 2012-03-08 (×3): qty 0.4

## 2012-03-08 MED ORDER — IOHEXOL 300 MG/ML  SOLN
20.0000 mL | INTRAMUSCULAR | Status: AC
  Administered 2012-03-08 (×2): 20 mL via ORAL

## 2012-03-08 MED ORDER — SODIUM CHLORIDE 0.9 % IV SOLN
125.0000 mg | Freq: Every day | INTRAVENOUS | Status: AC
  Administered 2012-03-08 – 2012-03-12 (×5): 125 mg via INTRAVENOUS
  Filled 2012-03-08 (×9): qty 10

## 2012-03-08 NOTE — Progress Notes (Signed)
Utilization review completed.  

## 2012-03-08 NOTE — Progress Notes (Signed)
Patient ID: [name], male   DOB: 1945-09-19, 66 y.o.   MRN: [id number] Subjective:  Dyspnea a bit better  Objective:  Vital Signs in the last 24 hours: Temp:  [97.5 F (36.4 C)-98 F (36.7 C)] 97.7 F (36.5 C) (12/17 0700) Pulse Rate:  [78-89] 80  (12/17 0830) Resp:  [13-23] 14  (12/17 0830) BP: (90-139)/(53-80) 91/53 mmHg (12/17 0830) SpO2:  [90 %-100 %] 98 % (12/17 0830) Weight:  [166 lb 3.6 oz (75.4 kg)-172 lb 6.4 oz (78.2 kg)] 166 lb 3.6 oz (75.4 kg) (12/17 0700)  Intake/Output from previous day: 12/16 0701 - 12/17 0700 In: 720 [P.O.:720] Out: 3700 [Urine:750] Intake/Output from this shift:    Physical Exam: Chronically ill appearing NAD HEENT: Unremarkable Neck:  7 cm JVD, no thyromegally Lungs:  Rales in the bases 1/3 up right over left HEART:  Regular rate rhythm, no murmurs, no rubs, no clicks Abd:  soft, positive bowel sounds, no organomegally, no rebound, no guarding Ext:  2 plus pulses, no edema, no cyanosis, no clubbing Skin:  No rashes no nodules Neuro:  CN II through XII intact, motor grossly intact  Lab Results:  Basename 03/08/12 0725  WBC 7.7  HGB 10.0*  PLT 195    Basename 03/08/12 0725 03/06/12 1002  NA 130* 125*  K 4.5 5.0  CL 88* 83*  CO2 30 30  GLUCOSE 142* 102*  BUN 88* 114*  CREATININE 2.87* 3.30*   No results found for this basename: TROPONINI:2,CK,MB:2 in the last 72 hours Hepatic Function Panel  Basename 03/08/12 0725 03/07/12 1718  PROT -- --  ALBUMIN 2.9* --  AST -- --  ALT -- 11  ALKPHOS -- --  BILITOT -- --  BILIDIR -- --  IBILI -- --   No results found for this basename: CHOL in the last 72 hours No results found for this basename: PROTIME in the last 72 hours  Imaging: Dg Chest 2 View  03/07/2012  *RADIOLOGY REPORT*  Clinical Data: Pleural effusion and cough.  CHEST - 2 VIEW  Comparison: Two-view chest 03/06/2012.  Findings: The heart is enlarged.  Pacing wires are in place.  This interstitial edema is  stable.  Bilateral pleural effusions are stable, right greater than left.  Associated airspace disease is evident.  The visualized soft tissues and bony thorax are unremarkable.  IMPRESSION:  1.  Stable cardiac enlargement and pulmonary edema compatible with congestive heart failure. 2.  Stable bilateral pleural effusions, right greater than left.   Original Report Authenticated By: Marin Roberts, M.D.    Dg Abd 1 View  03/06/2012  *RADIOLOGY REPORT*  Clinical Data: Left-sided abdominal pain for 2 days.  ABDOMEN - 1 VIEW  Comparison: Radiographs dated 05/03/2009  Findings: There are no dilated loops of large or small bowel. There is marked increased density at the right lung base since the prior chest x-ray of 03/01/2012.  The possibility of a acute process in the right lung should be considered.  Osseous structures are normal.  There is a tiny calcification in the region of the right renal pelvis which is felt to be vascular in origin as compared to prior CT scan dated 05/04/2009.  IMPRESSION: Benign-appearing abdomen.  There appears to be complete opacification of the right lung base since the prior chest x-ray of 05/03/2011.  I recommend a chest x- ray for further evaluation.   Original Report Authenticated By: Francene Boyers, M.D.    Ir Fluoro Guide Cv Line Right  03/07/2012  *  RADIOLOGY REPORT*  Clinical history:66 year old with acute renal failure.  PROCEDURE(S): PLACEMENT OF DIALYSIS CATHETER WITH ULTRASOUND AND FLUOROSCOPIC GUIDANCE  Physician: Rachelle Hora. Henn, MD  Medications:None  Moderate sedation time:None  Fluoroscopy time: 0.2 minutes  Procedure:The procedure was explained to the patient.  The risks and benefits of the procedure were discussed and the patient's questions were addressed.  Informed consent was obtained from the patient.  Ultrasound confirmed a patent right internal jugular vein.  The right side of the neck was prepped and draped in a sterile fashion.  Maximal barrier sterile  technique was utilized including caps, mask, sterile gowns, sterile gloves, sterile drape, hand hygiene and skin antiseptic.  The skin was anesthetized with 1% lidocaine. 19 gauge needle was directed into the right internal jugular vein with ultrasound guidance.  Wire was advanced into the SVC and IVC.  The tract was dilated to accommodate a 20 cm Trialysis catheter.  The three lumens aspirated and flushed well. The proper amount of heparin was placed within the dialysis lumens. Catheter sutured to the skin. Fluoroscopic and ultrasound images were taken and saved for documentation.  Findings:Catheter tip at the junction of SVC and right atrium.  Complications: None  Impression:Successful placement of a temporary dialysis catheter with ultrasound and fluoroscopic guidance.   Original Report Authenticated By: Richarda Overlie, M.D.    Ir US Guide Vasc Access Right  03/07/2012  *RADIOLOGY REPORT*  Clinical history:66 year old with acute renal failure.  PROCEDURE(S): PLACEMENT OF DIALYSIS CATHETER WITH ULTRASOUND AND FLUOROSCOPIC GUIDANCE  Physician: Rachelle Hora. Henn, MD  Medications:None  Moderate sedation time:None  Fluoroscopy time: 0.2 minutes  Procedure:The procedure was explained to the patient.  The risks and benefits of the procedure were discussed and the patient's questions were addressed.  Informed consent was obtained from the patient.  Ultrasound confirmed a patent right internal jugular vein.  The right side of the neck was prepped and draped in a sterile fashion.  Maximal barrier sterile technique was utilized including caps, mask, sterile gowns, sterile gloves, sterile drape, hand hygiene and skin antiseptic.  The skin was anesthetized with 1% lidocaine. 19 gauge needle was directed into the right internal jugular vein with ultrasound guidance.  Wire was advanced into the SVC and IVC.  The tract was dilated to accommodate a 20 cm Trialysis catheter.  The three lumens aspirated and flushed well. The proper  amount of heparin was placed within the dialysis lumens. Catheter sutured to the skin. Fluoroscopic and ultrasound images were taken and saved for documentation.  Findings:Catheter tip at the junction of SVC and right atrium.  Complications: None  Impression:Successful placement of a temporary dialysis catheter with ultrasound and fluoroscopic guidance.   Original Report Authenticated By: Richarda Overlie, M.D.     Cardiac Studies: Tele - atrial fib with ventricular pacing Assessment/Plan:  1. Acute on chronic systolic CHF 2. Atrial fibrillation with an RVR 3. 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 S/p HD Rec: he is improved with dialysis. Weight down. Dyspnea less. His weight loss has been marked. He has not had a CT scan in 2 years. Consider chest CT as his weight loss and tobacco abuse make me worry about an occult malignancy.  LOS: 12 days    Shaletta Hinostroza,M.D. .03/08/2012, 8:43 AM

## 2012-03-08 NOTE — Progress Notes (Addendum)
Subjective:  Feels better after HD Did not get full treatment or UF today due to hypotension to 90's Weight down nearly 6 kg with 2 treatments but still very volume overloaded  Objective Vital signs in last 24 hours: Filed Vitals:   03/08/12 1000 03/08/12 1010 03/08/12 1022 03/08/12 1111  BP: 87/53 93/78 96/50  90/53  Pulse: 83 83 84 81  Temp:   97.8 F (36.6 C) 97.6 F (36.4 C)  TempSrc:   Oral Oral  Resp: 14 16 18 18   Height:      Weight:   72.8 kg (160 lb 7.9 oz)   SpO2: 100% 100% 100% 100%   Weight change: 0.2 kg (7.1 oz)  Intake/Output Summary (Last 24 hours) at 03/08/12 1244 Last data filed at 03/08/12 1022  Gross per 24 hour  Intake    600 ml  Output   6075 ml  Net  -5475 ml   Weights: 03/08/12 1022 72.8 kg (160 lb 7.9 oz) post dialysis 03/08/12 0700 75.4 kg (166 lb 3.6 oz) pre dialysis 03/07/12 2039 75.4 kg (166 lb 3.6 oz) post dialysis 03/07/12 1936 75.4 kg (166 lb 3.6 oz) pre dialysis 03/07/12 1700 78.2 kg (172 lb 6.4 oz)   03/06/12 1958 78 kg (171 lb 15.3 oz)   03/06/12 0500 78 kg (171 lb 15.3 oz)   Physical Exam:  Blood pressure 90/53, pulse 81, temperature 97.6 F (36.4 C), temperature source Oral, resp. rate 18, height 5\' 5"  (1.651 m), weight 72.8 kg (160 lb 7.9 oz), SpO2 100.00%. Eating lunch at bedside Right IJ temp cath Decreased BS right side fairly clear left Abdominal wall and sacral edema 3+ peripheral edema Foley small amount yellow urine  Labs: Basic Metabolic Panel:  Lab 03/08/12 1610 03/06/12 1002 03/05/12 0524 03/04/12 0447 03/03/12 1555 03/03/12 0435 03/02/12 0508  NA 130* 125* 127* 126* 129* 129* 132*  K 4.5 5.0 4.7 5.5* 5.5* 5.5* 4.6  CL 88* 83* 84* 85* 87* 89* 88*  CO2 30 30 31 30  33* 31 35*  GLUCOSE 142* 102* 73 200* 170* 123* 151*  BUN 88* 114* 104* 101* 97* 94* 84*  CREATININE 2.87* 3.30* 3.05* 3.11* 3.11* 2.83* 2.46*  ALB -- -- -- -- -- -- --  CALCIUM 8.4 8.1* 8.1* 8.2* 8.2* 8.0* 8.7  PHOS 6.3* 7.0* 6.9* 6.3* 6.2* 6.1* --    Liver Function Tests:  Lab 03/08/12 0725 03/07/12 1718 03/06/12 1002 03/05/12 0524  AST -- -- -- --  ALT -- 11 -- --  ALKPHOS -- -- -- --  BILITOT -- -- -- --  PROT -- -- -- --  ALBUMIN 2.9* -- 3.0* 2.9*  CBC:  Lab 03/08/12 0725 03/05/12 0524  WBC 7.7 7.2  NEUTROABS -- --  HGB 10.0* 10.1*  HCT 32.4* 34.7*  MCV 77.7* 79.4  PLT 195 180   CBG:  Lab 03/08/12 1108 03/07/12 2153 03/07/12 1156 03/07/12 0748 03/06/12 2156  GLUCAP 124* 135* 131* 169* 102*   Results for BRAXLEY, BALANDRAN (MRN [id number]) as of 03/08/2012 12:56  Ref. Range 02/27/2012 06:12  Iron Latest Range: 42-135 ug/dL 27 (L)  UIBC Latest Range: 125-400 ug/dL 119  TIBC Latest Range: 215-435 ug/dL 147  Saturation Ratios Latest Range: 20-55 % 8 (L)  Ferritin Latest Range: 22-322 ng/mL 123   Studies/Results: Dg Chest 2 View  03/07/2012  *RADIOLOGY REPORT*  Clinical Data: Pleural effusion and cough.  CHEST - 2 VIEW  Comparison: Two-view chest 03/06/2012.  Findings: The heart is enlarged.  Pacing wires are in place.  This interstitial edema is stable.  Bilateral pleural effusions are stable, right greater than left.  Associated airspace disease is evident.  The visualized soft tissues and bony thorax are unremarkable.  IMPRESSION:  1.  Stable cardiac enlargement and pulmonary edema compatible with congestive heart failure. 2.  Stable bilateral pleural effusions, right greater than left.   Original Report Authenticated By: Marin Roberts, M.D.    Dg Abd 1 View  03/06/2012  *RADIOLOGY REPORT*  Clinical Data: Left-sided abdominal pain for 2 days.  ABDOMEN - 1 VIEW  Comparison: Radiographs dated 05/03/2009  Findings: There are no dilated loops of large or small bowel. There is marked increased density at the right lung base since the prior chest x-ray of 03/01/2012.  The possibility of a acute process in the right lung should be considered.  Osseous structures are normal.  There is a tiny calcification in the region of the  right renal pelvis which is felt to be vascular in origin as compared to prior CT scan dated 05/04/2009.  IMPRESSION: Benign-appearing abdomen.  There appears to be complete opacification of the right lung base since the prior chest x-ray of 05/03/2011.  I recommend a chest x- ray for further evaluation.   Original Report Authenticated By: Francene Boyers, M.D.    Ir Fluoro Guide Cv Line Right  03/07/2012  *RADIOLOGY REPORT*  Clinical history:66 year old with acute renal failure.  PROCEDURE(S): PLACEMENT OF DIALYSIS CATHETER WITH ULTRASOUND AND FLUOROSCOPIC GUIDANCE  Physician: Rachelle Hora. Henn, MD  Medications:None  Moderate sedation time:None  Fluoroscopy time: 0.2 minutes  Procedure:The procedure was explained to the patient.  The risks and benefits of the procedure were discussed and the patient's questions were addressed.  Informed consent was obtained from the patient.  Ultrasound confirmed a patent right internal jugular vein.  The right side of the neck was prepped and draped in a sterile fashion.  Maximal barrier sterile technique was utilized including caps, mask, sterile gowns, sterile gloves, sterile drape, hand hygiene and skin antiseptic.  The skin was anesthetized with 1% lidocaine. 19 gauge needle was directed into the right internal jugular vein with ultrasound guidance.  Wire was advanced into the SVC and IVC.  The tract was dilated to accommodate a 20 cm Trialysis catheter.  The three lumens aspirated and flushed well. The proper amount of heparin was placed within the dialysis lumens. Catheter sutured to the skin. Fluoroscopic and ultrasound images were taken and saved for documentation.  Findings:Catheter tip at the junction of SVC and right atrium.  Complications: None  Impression:Successful placement of a temporary dialysis catheter with ultrasound and fluoroscopic guidance.   Original Report Authenticated By: Richarda Overlie, M.D.    Medications:      . budesonide-formoterol  2 puff Inhalation  BID  . calcium acetate  667 mg Oral TID WC  . gabapentin  100 mg Oral TID  . hydrocerin   Topical BID  . insulin aspart  0-9 Units Subcutaneous TID WC  . insulin glargine  5 Units Subcutaneous Daily  . iohexol  20 mL Oral Q1 Hr x 2  . metoprolol succinate  50 mg Oral QHS  . pantoprazole  40 mg Oral Daily  . polyethylene glycol  17 g Oral Daily  . senna-docusate  1 tablet Oral BID  . simvastatin  40 mg Oral QHS  . sodium chloride  3 mL Intravenous Q12H  . Tamsulosin HCl  0.4 mg Oral QPC supper  . tiotropium  18 mcg Inhalation Daily    I  have reviewed scheduled and prn medications.  ASSESSMENT/RECOMMENDATIONS:   66 yo with baseline CKD (1.7-1.9) from DM, HTN, chronic cardiorenal syndrome, who has diuretic refractory edema in the  setting of CHF, cardiorenal exacerbation/severe RV dysfunction requiring initiation of dialysis/UF  1. Acute renal failure on chronic kidney disease stage III.:  S/p HD X 2 Temp cath placed 12/16 2nd TMT abbreviated d/t hypotension Worry that pericardial effusion may be playing a role here as he still  Has a LOT of volume on board With severity of cardiac issues dialysis may be permanent HD #3 tomorrow  2. Pericardial effusion - HOPE this was not contributor to BP drop on HD today Will ask cards opinion re management  ->spoke w/Rhonda - she will order repeat echo and ask cards to render an opinion  3. 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 Anemia - Hb 10 start ESA (darbe 60 12/18) Start iron load daily X 5 (since don't know definite HD schedule or if will be chronicyet  6. Ischemic cardiomyopathy with systolic and diastolic heart failure; EF 55% by echo 03/01/12; RV dysfunction/severe TR by echo as well  7. CAD with prior stenting (2006)  8. H/O AFIB with prior AV nodal ablation  9. H/O pacemaker [name])  10. COPD ([name])  11. BPH with history of urinary  retentionhas foley in ([name])   [name] Bal, MD [facility] [phone] pager 03/08/2012, 12:44 PM

## 2012-03-08 NOTE — Procedures (Signed)
Three hour hemodialysis treatment completed through right IJ temporary catheter.  BP unable to tolerate removal of 3L ordered.  2.4L removed with 31 minutes of interrupted ultrafiltration time due to hypotension.  All blood was reinfused.  Post HD BP 96/50, weight 78.2kg

## 2012-03-08 NOTE — Progress Notes (Signed)
[address]            [place] [address]          [phone]       [name] [facility] Medical Record #[id number] Date of Birth: 03/13/1946  Referring: No ref. provider found Primary Care: [name], MD  Chief Complaint:    Chief Complaint  Patient presents with  . Shortness of Breath  . Leg Swelling    BLE swelling    History of Present Illness:     66 yo chronically ill WM smoker admitted 12-6 with CHF, R pleural effusion and hx fr orthopnea and DOE. Enzymes neg . Treated with diuresis and thoracentesis. Diuresis not effective due to hx chronic renal insufficiency and patient started on hemodialysis. Cytology of pleural fluid is negative. Echo 10days ago showed EF .40 ( improved from prior echo EF .25,possibly  ETOH related) but with large pericardial effusion without tamponade.Because of the initiation of planned long term HD a pericardial effusion was recommended.  Patient has hx of a-fib on chronic coumadin and 1 yr ago had dual chamber pace maker with ablation of the AV node. Pericardial effusion has slowly increased since then.   Current Activity/ Functional Status: Poor functional status with limited mobility, conditioning   Past Medical History  Diagnosis Date  . ALCOHOL ABUSE, HX OF 04/30/2008  . 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 Atrial flutter 09/20/2006  . 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 Chronic systolic heart failure 09/20/2006    previous EF 25%;  Echocardiogram 12/10/10: Mild-moderate LVH, EF 50-55%, mild MR, moderate LAE, moderate RAE, mild RVE with moderately reduced RV SF, PASP 50, small pericardial effusion  . COPD 12/17/2006  . CORONARY ARTERY DISEASE 09/20/2006    s/p stent to OM;  catheterization 12/06: Mid to distal LAD 30%, D2 20%, proximal circumflex 30%, OM 2 stents patent, ostial RCA 50%, mid RCA 40%, distal RCA multiple 40%  .  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 DYSPEPSIA&OTHER [facility] DISORDERS FUNCTION STOMACH 04/04/2009  . HYPERTENSION 12/17/2006  . OBESITY 04/30/2008  . HLD (hyperlipidemia) 09/20/2006  . OTITIS EXTERNA, CHRONIC NEC 12/28/2006  . PERSONAL HX COLONIC POLYPS 05/02/2009  . PSORIASIS 04/30/2008  . CKD (chronic kidney disease) 04/30/2008    creatinine 2.2 range    Past Surgical History  Procedure Date  . Cardiac defibrillator placement   . Angioplasty   . Hernia repair     bilat  . Cardiac catheterization   . Coronary angioplasty     Stents placed  . Transthoracic echocardiogram 2005/2006/2008/2011    History  Smoking status  . Former Smoker -- 1.0 packs/day for 50 years  . Types: Cigarettes  . Quit date: 03/23/2005  Smokeless tobacco  . Never Used    History  Alcohol Use No    Comment: quit drinking 2007    History   Social History  . Marital Status: Married    Spouse Name: N/A    Number of Children: 0  . Years of Education: N/A   Occupational History  . [employer]    Social History Main Topics  . Smoking status: Former Smoker -- 1.0 packs/day for 50 years    Types: Cigarettes    Quit date: 03/23/2005  . Smokeless tobacco: Never  Used  . Alcohol Use: No     Comment: quit drinking 2007  . Drug Use: No  . Sexually Active: Not on file   Other Topics Concern  . Not on file   Social History Narrative  . No narrative on file    Allergies  Allergen Reactions  . Aspirin     REACTION: Swelling, trouble breathing  . Tetracycline Hcl     REACTION: Swelling, trouble breathing    Current Facility-Administered Medications  Medication Dose Route Frequency Provider Last Rate Last Dose  . acetaminophen (TYLENOL) tablet 650 mg  650 mg Oral Q6H PRN Eduard Clos, MD   650 mg at 03/08/12 1610   Or  . acetaminophen (TYLENOL) suppository 650 mg  650 mg Rectal Q6H PRN Eduard Clos, MD      . albuterol (PROVENTIL HFA;VENTOLIN  HFA) 108 (90 BASE) MCG/ACT inhaler 2 puff  2 puff Inhalation Q6H PRN Eduard Clos, MD   2 puff at 03/06/12 1036  . budesonide-formoterol (SYMBICORT) 160-4.5 MCG/ACT inhaler 2 puff  2 puff Inhalation BID Eduard Clos, MD   2 puff at 03/07/12 2100  . calcium acetate (PHOSLO) capsule 667 mg  667 mg Oral TID WC Sadie Haber, MD   667 mg at 03/08/12 1113  . darbepoetin (ARANESP) injection 40 mcg  40 mcg Intravenous Q Wed-HD Sadie Haber, MD      . diphenhydrAMINE-zinc acetate (BENADRYL) 2-0.1 % cream   Topical TID PRN Maryruth Bun Rama, MD   1 application at 02/27/12 1718  . doxercalciferol (HECTOROL) injection 1 mcg  1 mcg Intravenous Q M,W,F-HD Sadie Haber, MD      . ferric gluconate (NULECIT) 125 mg in sodium chloride 0.9 % 100 mL IVPB  125 mg Intravenous Daily Sadie Haber, MD      . gabapentin (NEURONTIN) capsule 100 mg  100 mg Oral TID Eduard Clos, MD   100 mg at 03/08/12 1112  . guaiFENesin tablet 200 mg  200 mg Oral Q6H PRN Belkys A Regalado, MD   200 mg at 03/06/12 2156  . hydrocerin (EUCERIN) cream   Topical BID Christina P Rama, MD      . insulin aspart (novoLOG) injection 0-9 Units  0-9 Units Subcutaneous TID WC Eduard Clos, MD   1 Units at 03/08/12 1425  . insulin glargine (LANTUS) injection 5 Units  5 Units Subcutaneous Daily Belkys A Regalado, MD   5 Units at 03/08/12 1136  . metoprolol succinate (TOPROL-XL) 24 hr tablet 50 mg  50 mg Oral QHS Vida Roller, MD   50 mg at 03/07/12 2207  . ondansetron (ZOFRAN) tablet 4 mg  4 mg Oral Q6H PRN Eduard Clos, MD   4 mg at 03/08/12 1151   Or  . ondansetron (ZOFRAN) injection 4 mg  4 mg Intravenous Q6H PRN Eduard Clos, MD   4 mg at 03/02/12 1808  . pantoprazole (PROTONIX) EC tablet 40 mg  40 mg Oral Daily Eduard Clos, MD   40 mg at 03/08/12 1112  . polyethylene glycol (MIRALAX / GLYCOLAX) packet 17 g  17 g Oral Daily Belkys A Regalado, MD   17 g at 03/08/12 1112  .  senna-docusate (Senokot-S) tablet 1 tablet  1 tablet Oral BID Belkys A Regalado, MD   1 tablet at 03/08/12 1113  . simvastatin (ZOCOR) tablet 40 mg  40 mg Oral QHS Eduard Clos, MD   40 mg at  03/07/12 2207  . sodium chloride 0.9 % injection 3 mL  3 mL Intravenous Q12H Eduard Clos, MD   3 mL at 03/08/12 1115  . Tamsulosin HCl (FLOMAX) capsule 0.4 mg  0.4 mg Oral QPC supper Maryruth Bun Rama, MD   0.4 mg at 03/07/12 2207  . tiotropium (SPIRIVA) inhalation capsule 18 mcg  18 mcg Inhalation Daily Eduard Clos, MD   18 mcg at 03/07/12 0807  . traMADol (ULTRAM) tablet 50 mg  50 mg Oral Q6H PRN Eduard Clos, MD   50 mg at 03/06/12 0436  . zolpidem (AMBIEN) tablet 5 mg  5 mg Oral QHS PRN Eduard Clos, MD   5 mg at 03/08/12 1610    Prescriptions prior to admission  Medication Sig Dispense Refill  . albuterol (PROAIR HFA) 108 (90 BASE) MCG/ACT inhaler Inhale 2 puffs into the lungs every 6 (six) hours as needed. For shortness of breath  1 Inhaler  6  . budesonide-formoterol (SYMBICORT) 160-4.5 MCG/ACT inhaler Inhale 2 puffs into the lungs 2 (two) times daily.  3 Inhaler  1  . CO-ENZYME Q-10 PO Take 1 capsule by mouth daily.      . furosemide (LASIX) 80 MG tablet Take 80 mg by mouth 2 (two) times daily.      Marland Kitchen gabapentin (NEURONTIN) 100 MG capsule Take 1 capsule (100 mg total) by mouth 3 (three) times daily.  90 capsule  3  . hydrocortisone cream 1 % Apply to affected area 2 times daily  15 g  0  . insulin glargine (LANTUS) 100 UNIT/ML injection 30 units at bedtime  10 mL  6  . insulin lispro (HUMALOG) 100 UNIT/ML injection Inject 20 Units into the skin 3 (three) times daily before meals. Inject 20 units before each meal - if blood sugars more than 200 add 8 units.      . metolazone (ZAROXOLYN) 2.5 MG tablet Take one tablet by mouth on Mon/Wed/Fri 30 min prior to Furosemide  30 tablet  3  . metoprolol (TOPROL-XL) 50 MG 24 hr tablet Take 25-50 mg by mouth daily. 1/2 tab in  the morning and 1 whole tab in the evening      . potassium chloride SA (K-DUR,KLOR-CON) 20 MEQ tablet TAKE 2 TABLETS BY MOUTH DAILY  180 tablet  1  . RABEprazole (ACIPHEX) 20 MG tablet Take 1 tablet (20 mg total) by mouth daily.  90 tablet  3  . simvastatin (ZOCOR) 40 MG tablet Take 1 tablet (40 mg total) by mouth at bedtime.  90 tablet  3  . temazepam (RESTORIL) 30 MG capsule Take 30 mg by mouth at bedtime as needed. For sleep      . tiotropium (SPIRIVA) 18 MCG inhalation capsule Place 18 mcg into inhaler and inhale daily.      . traMADol (ULTRAM) 50 MG tablet Take 1 tablet (50 mg total) by mouth every 6 (six) hours as needed for pain.  180 tablet  4  . warfarin (COUMADIN) 5 MG tablet Take 2.5 mg by mouth daily.         Family History  Problem Relation Age of Onset  . Lung cancer Mother     and aunt  . Heart attack Father   . Diabetes Sister     and aunt  . Colon cancer Neg Hx      Review of Systems:     Cardiac Review of Systems: Y or N  Chest Pain Milo.Brash    ]  Resting SOB y[   ] Exertional SOB  Cove.Etienne  ]  Orthopnea Cove.Etienne  ]   Pedal Edema Cove.Etienne   ]    Palpitations Cove.Etienne  ] Syncope  Milo.Brash  ]   Presyncope [ n  ]  General Review of Systems: [Y] = yes [  ]=no Constitional: recent weight change [  ]; anorexia [  ]; fatigue [  ]; nausea [  ]; night sweats [  ]; fever [  ]; or chills [  ];                                                                                                                                          Dental: poor dentition[  ]; Last Dentist visit: > 1 year  Eye : blurred vision [  ]; diplopia [   ]; vision changes [  ];  Amaurosis fugax[  ]; Resp: cough [  ];  wheezing[y  ];  hemoptysis[  ]; shortness of breath[y  ]; paroxysmal nocturnal dyspnea[ y ]; dyspnea on exertion[y  ]; or orthopnea[ y ];  GI:  gallstones[  ], vomiting[  ];  dysphagia[  ]; melena[  ];  hematochezia [  ]; heartburn[  ];   Hx of  Colonoscopy[  ]; GU: kidney stones [  ]; hematuria[  ];   dysuria [  ];  nocturia[   ];  history of     obstruction [  ];             Skin: rash, swelling[  ];, hair loss[  ];  peripheral edema[  ];  or itching[  ]; Musculosketetal: myalgias[  ];  joint swelling[  ];  joint erythema[  ];  joint pain[  ];  back pain[  ];  Heme/Lymph: bruising[ y ];  bleeding[  ];  anemia[  ];  Neuro: TIA[  ];  headaches[  ];  stroke[  ];  vertigo[  ];  seizures[  ];   paresthesias[  ];  difficulty walking[  ];  Psych:depression[y  ]; anxiety[  ];  Endocrine: diabetes[  ];  thyroid dysfunction[  ];  Immunizations: Flu [  ]; Pneumococcal[  ];  Other:  Physical Exam: BP 95/54  Pulse 82  Temp 98.1 F (36.7 C) (Oral)  Resp 18  Ht 5\' 5"  (1.651 m)  Wt 160 lb 7.9 oz (72.8 kg)  BMI 26.71 kg/m2  SpO2 100%  Chronically ill appearing white male Normocephalic, porr dentition Mild JVD, no cervical mass or bruit Thorax with increase AP diameter, decrease breath sounds at the R base Cardiac with regular (paced) rhythm) no murmur Abdomen obese with probable ascites extremities with 3+ pedal edema, areas of cellulitis  Over legs, non palpable pedal pulses Neuro- sleepy, lethargic    Diagnostic Studies & Laboratory data:     Recent Radiology Findings:   Dg Chest 2 View  03/07/2012  *RADIOLOGY REPORT*  Clinical Data: Pleural effusion and cough.  CHEST - 2 VIEW  Comparison: Two-view chest 03/06/2012.  Findings: The heart is enlarged.  Pacing wires are in place.  This interstitial edema is stable.  Bilateral pleural effusions are stable, right greater than left.  Associated airspace disease is evident.  The visualized soft tissues and bony thorax are unremarkable.  IMPRESSION:  1.  Stable cardiac enlargement and pulmonary edema compatible with congestive heart failure. 2.  Stable bilateral pleural effusions, right greater than left.   Original Report Authenticated By: Marin Roberts, M.D.    Ir Fluoro Guide Cv Line Right  03/07/2012  *RADIOLOGY REPORT*  Clinical history:66 year old with acute  renal failure.  PROCEDURE(S): PLACEMENT OF DIALYSIS CATHETER WITH ULTRASOUND AND FLUOROSCOPIC GUIDANCE  Physician: Rachelle Hora. Henn, MD  Medications:None  Moderate sedation time:None  Fluoroscopy time: 0.2 minutes  Procedure:The procedure was explained to the patient.  The risks and benefits of the procedure were discussed and the patient's questions were addressed.  Informed consent was obtained from the patient.  Ultrasound confirmed a patent right internal jugular vein.  The right side of the neck was prepped and draped in a sterile fashion.  Maximal barrier sterile technique was utilized including caps, mask, sterile gowns, sterile gloves, sterile drape, hand hygiene and skin antiseptic.  The skin was anesthetized with 1% lidocaine. 19 gauge needle was directed into the right internal jugular vein with ultrasound guidance.  Wire was advanced into the SVC and IVC.  The tract was dilated to accommodate a 20 cm Trialysis catheter.  The three lumens aspirated and flushed well. The proper amount of heparin was placed within the dialysis lumens. Catheter sutured to the skin. Fluoroscopic and ultrasound images were taken and saved for documentation.  Findings:Catheter tip at the junction of SVC and right atrium.  Complications: None  Impression:Successful placement of a temporary dialysis catheter with ultrasound and fluoroscopic guidance.   Original Report Authenticated By: Richarda Overlie, M.D.    Ir US Guide Vasc Access Right  03/07/2012  *RADIOLOGY REPORT*  Clinical history:66 year old with acute renal failure.  PROCEDURE(S): PLACEMENT OF DIALYSIS CATHETER WITH ULTRASOUND AND FLUOROSCOPIC GUIDANCE  Physician: Rachelle Hora. Henn, MD  Medications:None  Moderate sedation time:None  Fluoroscopy time: 0.2 minutes  Procedure:The procedure was explained to the patient.  The risks and benefits of the procedure were discussed and the patient's questions were addressed.  Informed consent was obtained from the patient.  Ultrasound  confirmed a patent right internal jugular vein.  The right side of the neck was prepped and draped in a sterile fashion.  Maximal barrier sterile technique was utilized including caps, mask, sterile gowns, sterile gloves, sterile drape, hand hygiene and skin antiseptic.  The skin was anesthetized with 1% lidocaine. 19 gauge needle was directed into the right internal jugular vein with ultrasound guidance.  Wire was advanced into the SVC and IVC.  The tract was dilated to accommodate a 20 cm Trialysis catheter.  The three lumens aspirated and flushed well. The proper amount of heparin was placed within the dialysis lumens. Catheter sutured to the skin. Fluoroscopic and ultrasound images were taken and saved for documentation.  Findings:Catheter tip at the junction of SVC and right atrium.  Complications: None  Impression:Successful placement of a temporary dialysis catheter with ultrasound and fluoroscopic guidance.   Original Report Authenticated By: Richarda Overlie, M.D.       Recent Lab Findings: Lab Results  Component Value Date   WBC 7.7 03/08/2012  HGB 10.0* 03/08/2012   HCT 32.4* 03/08/2012   PLT 195 03/08/2012   GLUCOSE 142* 03/08/2012   CHOL 120 02/08/2011   TRIG 99 02/08/2011   HDL 43 02/08/2011   LDLCALC 57 02/08/2011   ALT 11 03/07/2012   AST 19 02/26/2012   NA 130* 03/08/2012   K 4.5 03/08/2012   CL 88* 03/08/2012   CREATININE 2.87* 03/08/2012   BUN 88* 03/08/2012   CO2 30 03/08/2012   TSH 3.537 02/26/2012   INR 1.63* 03/08/2012   HGBA1C 8.4* 01/08/2012      Assessment / Plan:      Patient would benefit from subxyphoid pericardial window and placement of R chest tube under general anesthesia. He would be at increased risk for postop pulmonary complications and will get ABG and PFT's Procedure later this week, 12-20 Will need repeat Echo before surgery, prior echo done a week ago.  Procedure discussed with patient.      @me1 @ 03/08/2012 3:28 PM

## 2012-03-08 NOTE — Progress Notes (Signed)
[facility] PROGRESS NOTE  [name]:[id number] DOB: [birth year] DOA: 03/08/2012 PCP: [name], MD  Assessment/Plan: Very Pleasant 66 year old presents with SOB and lower extremities edema. He was diagnosed with Acute on chronic HF exacerbation. He was found to have pleural effusion and has moderate pericardial effusion with no physiologic tamponade. He develop acute on chronic renal failure. He now will require dialysis. Nephrology following patient and pending response to HD he may require pericardiocentesis.   Acute on chronic combined systolic and diastolic CHF (congestive heart failure) / Small right pleural effusion  EF 50 to 55 % ECHO 03-01-2012.  Lasix change to BUMEX.  Repeat chest x-ray 02/29/2012 showed an enlarging effusion. Status post thoracentesis with 1.8 L of fluid removed on 12-10. culture no growth to date, cell count WBC 172, LDH 91, protein 2.9 and glucose156. Called and added cytology evaluation of pleural fluid. Appreciate cardiology assistance.  Pericardial effusion: Consulted Cardiothoracic surgery for evaluation of pericardial fluid and possible pericadiocentesis on 03/08/12 please review their note for their recommendations. No obvious tamponade physiology. Hold Coumadin in anticipation of possible need for pericardiocentesis or pericardial window.        Patient will need repeat ECHO in 1-2 weeks. Prophylaxis dose Lovenox discontinued 12-15 in case patient require pericardiocentesis.  3-Urinary retention  Patient had 708 cc in bladder on bladder scan 12-10. Foley catheter placed. No hydronephrosis on renal ultrasound.  Continue with Flomax.  Can d/c home with foley, outpatient urology followup.  Will need to make sure patient able to afford medications prior to discharge.  Neosporin cream to catheter area for irritation.   4-Microcytic anemia  Ferritin normal but iron low with low saturation ratios, consistent with AOCD.  HB at 10,  stable.   5-Hypokalemia / hyperkalemia  Resolved and currently within normal limits  6-Skin wounds  Seen by wound care nurse 02/28/2012. Benadryl cream when necessary for pruritus. Eucerin cream for maintenance of moisturization.  7-Lower extremity edema  Dopplers done. DVT ruled out.  8-DM w/o Complication Type II / hypoglycemia  Lantus discontinued on 02/28/2012 secondary to hypoglycemia. SSI.  CBG stable on low dose lantus   9-Atrial fibrillation  Status post AV nodal ablation. Has pacemaker. Rate controlled, Coumadin on hold in case patient required procedure. Cardio recommend holding anticoagulation for at least 1 week.   10-COPD  Stable. Continue Symbicort and Spiriva.  11-E. Coli UTI (lower urinary tract infection)  Patient completed antibiotic regimen with rocephin Patient WBC within normal limits  12-Stage III chronic kidney disease  Acute on chronic renal failure secondary to Heart failure exacerbation, hemodynamically mediated injury and evolution of ATN.  Cr baseline 1.6 to 1.9 prior records.  Patient currently at [facility] for Dialysis treatment. Plan to start dialysis 12-16 and temp cath placed 03/07/12  Appreciate Dr Allena Katz Assistance.   13-Constipation: Start Docusate, miralax. Had BM.  14-Cough: Check chest x ray performed and showed stable cardiac enlargement and pulmonary edema compatible with CHF, stable BL pleural effusions R> L  15-Abdominal pain : resolved and likely due to constipation.  Given Weight loss with effusions I agree with cardiologist that patient will require further work up and as such I have ordered CT of chest abdomen and pelvis.  Code Status: Full.  Family Communication: no family at bedside. Disposition Plan: Home when stable.   Consultants:  Cardiology, Dr. Eden Emms.  Dr Allena Katz, Nephrologist Procedures:  Thoracentesis 12-10.  Antibiotics:  Rocephin 02/26/2012--->02/29/12  Cipro 02/29/2012--->    HPI/Subjective: Patient  has no new  complaints today.  He denies any chest pain SOB. Reportedly dialysis had to be discontinued early due to drop in blood pressure and they were not able to remove as much fluid as they had hoped for.     Objective: Filed Vitals:   03/08/12 1250 03/08/12 1300 03/08/12 1537 03/08/12 1758  BP: 95/54  121/85 135/89  Pulse: 82  71 80  Temp:  98.1 F (36.7 C) 98.5 F (36.9 C) 98.5 F (36.9 C)  TempSrc:  Oral Oral Oral  Resp:  18 18 18   Height:      Weight:      SpO2:  100% 100% 100%    Intake/Output Summary (Last 24 hours) at 03/08/12 1940 Last data filed at 03/08/12 1744  Gross per 24 hour  Intake    840 ml  Output   3125 ml  Net  -2285 ml   Filed Weights   03/07/12 2039 03/08/12 0700 03/08/12 1022  Weight: 75.4 kg (166 lb 3.6 oz) 75.4 kg (166 lb 3.6 oz) 72.8 kg (160 lb 7.9 oz)    Exam:   General:  No distress, chronic ill appearing.   Cardiovascular: S 1, S 2 RRR  Respiratory: decreased breath sounds, no wheezes  Abdomen: Bs present, soft. nontender  Extremities: significant edema.   Data Reviewed: Basic Metabolic Panel:  Lab 03/08/12 1610 03/06/12 1002 03/05/12 0524 03/04/12 1540 03/04/12 0447 03/03/12 1555  NA 130* 125* 127* -- 126* 129*  K 4.5 5.0 4.7 -- 5.5* 5.5*  CL 88* 83* 84* -- 85* 87*  CO2 30 30 31  -- 30 33*  GLUCOSE 142* 102* 73 -- 200* 170*  BUN 88* 114* 104* -- 101* 97*  CREATININE 2.87* 3.30* 3.05* -- 3.11* 3.11*  CALCIUM 8.4 8.1* 8.1* -- 8.2* 8.2*  MG -- -- 2.1 2.2 -- --  PHOS 6.3* 7.0* 6.9* -- 6.3* 6.2*   Liver Function Tests:  Lab 03/08/12 0725 03/07/12 1718 03/06/12 1002 03/05/12 0524 03/04/12 0447 03/03/12 1555  AST -- -- -- -- -- --  ALT -- 11 -- -- -- --  Jolly Mango -- -- -- -- -- --  BILITOT -- -- -- -- -- --  PROT -- -- -- -- -- --  ALBUMIN 2.9* -- 3.0* 2.9* 2.8* 2.6*  CBC:  Lab 03/08/12 0725 03/05/12 0524  WBC 7.7 7.2  NEUTROABS -- --  HGB 10.0* 10.1*  HCT 32.4* 34.7*  MCV 77.7* 79.4  PLT 195 180   Cardiac Enzymes: No results  found for this basename: CKTOTAL:5,CKMB:5,CKMBINDEX:5,TROPONINI:5 in the last 168 hours BNP (last 3 results)  Basename 02/29/12 0505 02/26/12 0105 04/07/11 0934  PROBNP 8263.0* 8685.0* 205.0*   CBG:  Lab 03/08/12 1642 03/08/12 1108 03/07/12 2153 03/07/12 1156 03/07/12 0748  GLUCAP 139* 124* 135* 131* 169*    Recent Results (from the past 240 hour(s))  BODY FLUID CULTURE     Status: Normal   Collection Time   03/01/12  2:09 PM      Component Value Range Status Comment   Specimen Description PLEURAL RIGHT EFFUSION   Final    Special Requests Normal   Final    Gram Stain     Final    Value: RARE WBC PRESENT, PREDOMINANTLY PMN     NO ORGANISMS SEEN   Culture NO GROWTH 3 DAYS   Final    Report Status 03/05/2012 FINAL   Final   URINE CULTURE     Status: Normal   Collection Time  03/02/12  3:14 PM      Component Value Range Status Comment   Specimen Description URINE, CLEAN CATCH   Final    Special Requests NONE   Final    Culture  Setup Time 03/03/2012 01:27   Final    Colony Count NO GROWTH   Final    Culture NO GROWTH   Final    Report Status 03/04/2012 FINAL   Final      Studies: Ct Abdomen Pelvis Wo Contrast  03/08/2012  *RADIOLOGY REPORT*  Clinical Data:  Recent weight loss.  No effusions and ascites. Evaluate for malignancy.  CT CHEST, ABDOMEN AND PELVIS WITHOUT CONTRAST  Technique:  Multidetector CT imaging of the chest, abdomen and pelvis was performed following the standard protocol without IV contrast.  Comparison:  CT of the abdomen and pelvis 05/04/2009.  Chest x-ray 03/07/2012.  CT CHEST  Findings:  Large right pleural effusion and small to moderate left pleural effusion.  Moderate pericardial effusion.  Cardiomegaly. Pacer wires in place.  Diffuse coronary artery calcifications present.  Aorta is normal caliber with calcifications in the aortic arch and great vessels.  Compressive atelectasis in the lower lobes bilaterally, right greater than left.  Small scattered  mediastinal lymph nodes, none pathologically enlarged. No mediastinal, hilar, or axillary adenopathy.  Visualized thyroid and chest wall soft tissues unremarkable.  No acute bony abnormality.  IMPRESSION: Large right pleural effusion.  Small moderate left pleural effusion.  Compressive atelectasis bilaterally, right worse than left.  Cardiomegaly.  Moderate pericardial effusion.  CT ABDOMEN AND PELVIS  Findings:  Mildly nodular contours to the liver suggests cirrhosis. There is a small amount of perihepatic, perisplenic and cul-de-sac free fluid.  Urinary bladder is decompressed with Foley catheter in place.  Bilateral inguinal hernias containing fluid and fat.  Spleen is normal size.  No visible focal abnormality on this unenhanced study.  Pancreas, adrenals and kidneys have an unremarkable unenhanced appearance.  Small nonobstructing stones in the mid and lower pole of the right kidney.  Vascular calcifications throughout the aorta and branch vessels.  No aneurysm.  Large and small bowel as well as stomach grossly unremarkable. Mild edema throughout the mesentery and subcutaneous soft tissues of the abdomen and pelvis.  No acute bony abnormality.  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  IMPRESSION: Subtle nodularity to the liver contour suggests possibility of cirrhosis.  Recommend clinical correlation.  Small amount of ascites.  Anasarca like stranding within the mesentery and subcutaneous soft tissues.  Bilateral inguinal hernias containing fat and fluid.   Original Report Authenticated By: Charlett Nose, M.D.    Dg Chest 2 View  03/07/2012  *RADIOLOGY REPORT*  Clinical Data: Pleural effusion and cough.  CHEST - 2 VIEW  Comparison: Two-view chest 03/06/2012.  Findings: The heart is enlarged.  Pacing wires are in place.  This interstitial edema is stable.  Bilateral pleural effusions are stable, right greater than left.  Associated airspace disease is evident.  The visualized soft tissues and bony thorax are  unremarkable.  IMPRESSION:  1.  Stable cardiac enlargement and pulmonary edema compatible with congestive heart failure. 2.  Stable bilateral pleural effusions, right greater than left.   Original Report Authenticated By: Marin Roberts, M.D.    Ct Chest Wo Contrast  03/08/2012  *RADIOLOGY REPORT*  Clinical Data:  Recent weight loss.  No effusions and ascites. Evaluate for malignancy.  CT CHEST, ABDOMEN AND PELVIS WITHOUT CONTRAST  Technique:  Multidetector CT imaging of the chest, abdomen and  pelvis was performed following the standard protocol without IV contrast.  Comparison:  CT of the abdomen and pelvis 05/04/2009.  Chest x-ray 03/07/2012.  CT CHEST  Findings:  Large right pleural effusion and small to moderate left pleural effusion.  Moderate pericardial effusion.  Cardiomegaly. Pacer wires in place.  Diffuse coronary artery calcifications present.  Aorta is normal caliber with calcifications in the aortic arch and great vessels.  Compressive atelectasis in the lower lobes bilaterally, right greater than left.  Small scattered mediastinal lymph nodes, none pathologically enlarged. No mediastinal, hilar, or axillary adenopathy.  Visualized thyroid and chest wall soft tissues unremarkable.  No acute bony abnormality.  IMPRESSION: Large right pleural effusion.  Small moderate left pleural effusion.  Compressive atelectasis bilaterally, right worse than left.  Cardiomegaly.  Moderate pericardial effusion.  CT ABDOMEN AND PELVIS  Findings:  Mildly nodular contours to the liver suggests cirrhosis. There is a small amount of perihepatic, perisplenic and cul-de-sac free fluid.  Urinary bladder is decompressed with Foley catheter in place.  Bilateral inguinal hernias containing fluid and fat.  Spleen is normal size.  No visible focal abnormality on this unenhanced study.  Pancreas, adrenals and kidneys have an unremarkable unenhanced appearance.  Small nonobstructing stones in the mid and lower pole of the  right kidney.  Vascular calcifications throughout the aorta and branch vessels.  No aneurysm.  Large and small bowel as well as stomach grossly unremarkable. Mild edema throughout the mesentery and subcutaneous soft tissues of the abdomen and pelvis.  No acute bony abnormality.  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  IMPRESSION: Subtle nodularity to the liver contour suggests possibility of cirrhosis.  Recommend clinical correlation.  Small amount of ascites.  Anasarca like stranding within the mesentery and subcutaneous soft tissues.  Bilateral inguinal hernias containing fat and fluid.   Original Report Authenticated By: Charlett Nose, M.D.    Ir Fluoro Guide Cv Line Right  03/07/2012  *RADIOLOGY REPORT*  Clinical history:66 year old with acute renal failure.  PROCEDURE(S): PLACEMENT OF DIALYSIS CATHETER WITH ULTRASOUND AND FLUOROSCOPIC GUIDANCE  Physician: Rachelle Hora. Henn, MD  Medications:None  Moderate sedation time:None  Fluoroscopy time: 0.2 minutes  Procedure:The procedure was explained to the patient.  The risks and benefits of the procedure were discussed and the patient's questions were addressed.  Informed consent was obtained from the patient.  Ultrasound confirmed a patent right internal jugular vein.  The right side of the neck was prepped and draped in a sterile fashion.  Maximal barrier sterile technique was utilized including caps, mask, sterile gowns, sterile gloves, sterile drape, hand hygiene and skin antiseptic.  The skin was anesthetized with 1% lidocaine. 19 gauge needle was directed into the right internal jugular vein with ultrasound guidance.  Wire was advanced into the SVC and IVC.  The tract was dilated to accommodate a 20 cm Trialysis catheter.  The three lumens aspirated and flushed well. The proper amount of heparin was placed within the dialysis lumens. Catheter sutured to the skin. Fluoroscopic and ultrasound images were taken and saved for documentation.  Findings:Catheter tip at the  junction of SVC and right atrium.  Complications: None  Impression:Successful placement of a temporary dialysis catheter with ultrasound and fluoroscopic guidance.   Original Report Authenticated By: Richarda Overlie, M.D.    Ir US Guide Vasc Access Right  03/07/2012  *RADIOLOGY REPORT*  Clinical history:66 year old with acute renal failure.  PROCEDURE(S): PLACEMENT OF DIALYSIS CATHETER WITH ULTRASOUND AND FLUOROSCOPIC GUIDANCE  Physician: Rachelle Hora. Lowella Dandy, MD  Medications:None  Moderate sedation time:None  Fluoroscopy time: 0.2 minutes  Procedure:The procedure was explained to the patient.  The risks and benefits of the procedure were discussed and the patient's questions were addressed.  Informed consent was obtained from the patient.  Ultrasound confirmed a patent right internal jugular vein.  The right side of the neck was prepped and draped in a sterile fashion.  Maximal barrier sterile technique was utilized including caps, mask, sterile gowns, sterile gloves, sterile drape, hand hygiene and skin antiseptic.  The skin was anesthetized with 1% lidocaine. 19 gauge needle was directed into the right internal jugular vein with ultrasound guidance.  Wire was advanced into the SVC and IVC.  The tract was dilated to accommodate a 20 cm Trialysis catheter.  The three lumens aspirated and flushed well. The proper amount of heparin was placed within the dialysis lumens. Catheter sutured to the skin. Fluoroscopic and ultrasound images were taken and saved for documentation.  Findings:Catheter tip at the junction of SVC and right atrium.  Complications: None  Impression:Successful placement of a temporary dialysis catheter with ultrasound and fluoroscopic guidance.   Original Report Authenticated By: Richarda Overlie, M.D.     Scheduled Meds:    . budesonide-formoterol  2 puff Inhalation BID  . calcium acetate  667 mg Oral TID WC  . darbepoetin (ARANESP) injection - DIALYSIS  40 mcg Intravenous Q Wed-HD  . doxercalciferol  1  mcg Intravenous Q M,W,F-HD  . ferric gluconate (FERRLECIT/NULECIT) IV  125 mg Intravenous Daily  . gabapentin  100 mg Oral TID  . hydrocerin   Topical BID  . insulin aspart  0-9 Units Subcutaneous TID WC  . insulin glargine  5 Units Subcutaneous Daily  . metoprolol succinate  50 mg Oral QHS  . pantoprazole  40 mg Oral Daily  . polyethylene glycol  17 g Oral Daily  . senna-docusate  1 tablet Oral BID  . simvastatin  40 mg Oral QHS  . sodium chloride  3 mL Intravenous Q12H  . Tamsulosin HCl  0.4 mg Oral QPC supper  . tiotropium  18 mcg Inhalation Daily   Continuous Infusions:   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 Type II  CORONARY ARTERY DISEASE  Atrial fibrillation  COPD  Hypokalemia  Hypoglycemia  Acute on chronic combined systolic and diastolic CHF (congestive heart failure)  UTI (lower urinary tract infection)  Stage III chronic kidney disease  Lower extremity edema  Microcytic anemia  Hyperkalemia  Pleural effusion, right  Urinary retention with incomplete bladder emptying  Acute on chronic renal failure    Time spent: 25 minutes    Penny Pia  [facility] Pager [phone]. If 8PM-8AM, please contact night-coverage at [email or web address], password [place] Day 03/08/2012, 7:40 PM  LOS: 12 days

## 2012-03-09 ENCOUNTER — Inpatient Hospital Stay (HOSPITAL_COMMUNITY): Payer: Medicare Other

## 2012-03-09 DIAGNOSIS — I309 Acute pericarditis, unspecified: Secondary | ICD-10-CM

## 2012-03-09 DIAGNOSIS — I059 Rheumatic mitral valve disease, unspecified: Secondary | ICD-10-CM

## 2012-03-09 LAB — COMPREHENSIVE METABOLIC PANEL
ALT: 13 U/L (ref 0–53)
AST: 24 U/L (ref 0–37)
CO2: 28 mEq/L (ref 19–32)
Chloride: 88 mEq/L — ABNORMAL LOW (ref 96–112)
GFR calc non Af Amer: 22 mL/min — ABNORMAL LOW (ref >90)
Potassium: 4.5 mEq/L (ref 3.5–5.1)
Sodium: 127 mEq/L — ABNORMAL LOW (ref 135–145)
Total Bilirubin: 0.6 mg/dL (ref 0.3–1.2)

## 2012-03-09 LAB — CBC WITH DIFFERENTIAL/PLATELET
Basophils Absolute: 0 10*3/uL (ref 0.0–0.1)
HCT: 35 % — ABNORMAL LOW (ref 39.0–52.0)
Lymphocytes Relative: 6 % — ABNORMAL LOW (ref 12–46)
Neutro Abs: 7.2 10*3/uL (ref 1.7–7.7)
Platelets: 201 10*3/uL (ref 150–400)
RDW: 18.6 % — ABNORMAL HIGH (ref 11.5–15.5)
WBC: 8.9 10*3/uL (ref 4.0–10.5)

## 2012-03-09 LAB — BLOOD GAS, ARTERIAL
Acid-Base Excess: 2.7 mmol/L — ABNORMAL HIGH (ref 0.0–2.0)
Bicarbonate: 28.4 mEq/L — ABNORMAL HIGH (ref 20.0–24.0)
Drawn by: 347621
FIO2: 0.28 %
O2 Saturation: 98.1 %
Patient temperature: 98.6
TCO2: 30.1 mmol/L (ref 0–100)
pCO2 arterial: 57.7 mmHg (ref 35.0–45.0)
pH, Arterial: 7.312 — ABNORMAL LOW (ref 7.350–7.450)
pO2, Arterial: 111 mmHg — ABNORMAL HIGH (ref 80.0–100.0)

## 2012-03-09 LAB — URINALYSIS, ROUTINE W REFLEX MICROSCOPIC
Glucose, UA: NEGATIVE mg/dL
Ketones, ur: NEGATIVE mg/dL
Nitrite: NEGATIVE
Protein, ur: 100 mg/dL — AB
Specific Gravity, Urine: 1.019 (ref 1.005–1.030)
Urobilinogen, UA: 1 mg/dL (ref 0.0–1.0)
pH: 5 (ref 5.0–8.0)

## 2012-03-09 LAB — APTT
aPTT: >200 seconds (ref 24–37)
aPTT: 73 seconds — ABNORMAL HIGH (ref 24–37)

## 2012-03-09 LAB — URINE MICROSCOPIC-ADD ON

## 2012-03-09 LAB — PARATHYROID HORMONE, INTACT (NO CA): PTH: 363.8 pg/mL — ABNORMAL HIGH (ref 14.0–72.0)

## 2012-03-09 LAB — PULMONARY FUNCTION TEST

## 2012-03-09 LAB — GLUCOSE, CAPILLARY
Glucose-Capillary: 126 mg/dL — ABNORMAL HIGH (ref 70–99)
Glucose-Capillary: 96 mg/dL (ref 70–99)

## 2012-03-09 MED ORDER — METOPROLOL SUCCINATE ER 25 MG PO TB24
25.0000 mg | ORAL_TABLET | Freq: Every day | ORAL | Status: DC
  Administered 2012-03-10: 25 mg via ORAL
  Filled 2012-03-09 (×3): qty 1

## 2012-03-09 MED ORDER — DOXERCALCIFEROL 4 MCG/2ML IV SOLN
INTRAVENOUS | Status: AC
  Administered 2012-03-09: 1 ug via INTRAVENOUS
  Filled 2012-03-09: qty 2

## 2012-03-09 MED ORDER — ALBUTEROL SULFATE (5 MG/ML) 0.5% IN NEBU
2.5000 mg | INHALATION_SOLUTION | Freq: Once | RESPIRATORY_TRACT | Status: AC
  Administered 2012-03-09: 2.5 mg via RESPIRATORY_TRACT

## 2012-03-09 MED ORDER — BOOST / RESOURCE BREEZE PO LIQD
1.0000 | ORAL | Status: DC
  Administered 2012-03-09 – 2012-03-20 (×7): 1 via ORAL

## 2012-03-09 MED ORDER — DARBEPOETIN ALFA-POLYSORBATE 40 MCG/0.4ML IJ SOLN
INTRAMUSCULAR | Status: AC
  Administered 2012-03-09: 40 ug via INTRAVENOUS
  Filled 2012-03-09: qty 0.4

## 2012-03-09 MED ORDER — WHITE PETROLATUM GEL
Status: AC
  Administered 2012-03-09: 08:00:00
  Filled 2012-03-09: qty 5

## 2012-03-09 NOTE — Progress Notes (Signed)
INITIAL NUTRITION ASSESSMENT  DOCUMENTATION CODES Per approved criteria  -Not Applicable   INTERVENTION: 1. RD provided diet education at this time 2. Add Resource Breeze po daily, each supplement provides 250 kcal and 9 grams of protein. 3. RD to continue to follow nutrition care plan  NUTRITION DIAGNOSIS: 1. Food- and nutrition-deficit related to lack of previous diet instruction as evidenced by new renal failure (?long-term HD.) Resolved. 2. Inadequate oral intake r/t poor appetite AEB poor meal completion.   Goal: 1. Verbalize basic understanding of renal diet. Met. 2. Pt to meet >/= 90% of their estimated nutrition needs.  Monitor:  weight trends, lab trends, I/O's, PO intake, supplement tolerance, need for further education  Reason for Assessment: MD Consult (education)  66 y.o. male  Admitting Dx: Pericardial effusion  ASSESSMENT: Admitted for acute on chronic CHF exacerbation. Pt with stage III CKD. Pt with fluid overload, s/p HD x 2. Temp catheter placed on 12/16. Last HD on 12/17, per renal note on that day, pt's weight down 6 kg with 2 treatments, however is still very volume overloaded.  Current intake is extremely variable, ranging from 25 - 75%. More recently, intake <50%.  RD consulted for Renal Education. Provided Choose-A-Meal Booklet to patient/family. Reviewed food groups and provided written recommended serving sizes specifically determined for patient's current nutritional status.   Explained why diet restrictions are needed and provided lists of foods to limit/avoid that are high potassium, sodium, and phosphorus. Provided specific recommendations on safer alternatives of these foods. Strongly encouraged compliance of this diet.   Discussed importance of protein intake at each meal and snack. Provided examples of how to maximize protein intake throughout the day. Discussed need for fluid restriction with dialysis, importance of minimizing weight gain between  HD treatments, and renal-friendly beverage options. Teach back method used.  Expect fair compliance.  Height: Ht Readings from Last 1 Encounters:  03/07/12 5\' 5"  (1.651 m)   Weight: Wt Readings from Last 1 Encounters:  03/08/12 159 lb 13.3 oz (72.5 kg)   Ideal Body Weight: 136 lb/61.8 kg  % Ideal Body Weight: 117%  Wt Readings from Last 10 Encounters:  03/08/12 159 lb 13.3 oz (72.5 kg)  01/08/12 150 lb (68.04 kg)  11/09/11 163 lb (73.936 kg)  10/20/11 153 lb (69.4 kg)  10/09/11 168 lb 9.6 oz (76.476 kg)  09/07/11 167 lb 1.9 oz (75.805 kg)  08/19/11 164 lb (74.39 kg)  08/07/11 173 lb (78.472 kg)  07/07/11 173 lb 12.8 oz (78.835 kg)  06/18/11 174 lb 12.8 oz (79.289 kg)   Usual Body Weight: 165 - 170 lb  % Usual Body Weight: 98%  BMI:  Body mass index is 26.60 kg/(m^2).  Overweight.  Estimated Nutritional Needs (for HD): Kcal: 2000 - 2200 kcal Protein: 86 - 100 grams Fluid: 1.2 liters daily  Skin: stage II on sacrum  Diet Order: Renal 60-70; 1200 ml fluid restriction  EDUCATION NEEDS: -Education needs addressed   Intake/Output Summary (Last 24 hours) at 03/09/12 0921 Last data filed at 03/08/12 2300  Gross per 24 hour  Intake   1080 ml  Output   2375 ml  Net  -1295 ml   Last BM: 12/17  Labs:  Lab 03/08/12 0725 03/06/12 1002 03/05/12 0524 03/04/12 1540  NA 130* 125* 127* --  K 4.5 5.0 4.7 --  CL 88* 83* 84* --  CO2 30 30 31  --  BUN 88* 114* 104* --  CREATININE 2.87* 3.30* 3.05* --  CALCIUM 8.4  8.1* 8.1* --  MG -- -- 2.1 2.2  PHOS 6.3* 7.0* 6.9* --  GLUCOSE 142* 102* 73 --    CBG (last 3)   Basename 03/09/12 0752 03/08/12 2136 03/08/12 1642  GLUCAP 126* 146* 139*    Scheduled Meds:   . budesonide-formoterol  2 puff Inhalation BID  . calcium acetate  667 mg Oral TID WC  . darbepoetin (ARANESP) injection - DIALYSIS  40 mcg Intravenous Q Wed-HD  . doxercalciferol  1 mcg Intravenous Q M,W,F-HD  . ferric gluconate (FERRLECIT/NULECIT) IV  125 mg  Intravenous Daily  . gabapentin  100 mg Oral TID  . hydrocerin   Topical BID  . insulin aspart  0-9 Units Subcutaneous TID WC  . insulin glargine  5 Units Subcutaneous Daily  . metoprolol succinate  50 mg Oral QHS  . pantoprazole  40 mg Oral Daily  . polyethylene glycol  17 g Oral Daily  . senna-docusate  1 tablet Oral BID  . simvastatin  40 mg Oral QHS  . sodium chloride  3 mL Intravenous Q12H  . Tamsulosin HCl  0.4 mg Oral QPC supper  . tiotropium  18 mcg Inhalation Daily    Continuous Infusions:   Past Medical History  Diagnosis Date  . ALCOHOL ABUSE, HX OF 04/30/2008  . 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 Atrial flutter 09/20/2006  . 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 Chronic systolic heart failure 09/20/2006    previous EF 25%;  Echocardiogram 12/10/10: Mild-moderate LVH, EF 50-55%, mild MR, moderate LAE, moderate RAE, mild RVE with moderately reduced RV SF, PASP 50, small pericardial effusion  . COPD 12/17/2006  . CORONARY ARTERY DISEASE 09/20/2006    s/p stent to OM;  catheterization 12/06: Mid to distal LAD 30%, D2 20%, proximal circumflex 30%, OM 2 stents patent, ostial RCA 50%, mid RCA 40%, distal RCA multiple 40%  . 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 DYSPEPSIA&OTHER [facility] DISORDERS FUNCTION STOMACH 04/04/2009  . HYPERTENSION 12/17/2006  . OBESITY 04/30/2008  . HLD (hyperlipidemia) 09/20/2006  . OTITIS EXTERNA, CHRONIC NEC 12/28/2006  . PERSONAL HX COLONIC POLYPS 05/02/2009  . PSORIASIS 04/30/2008  . CKD (chronic kidney disease) 04/30/2008    creatinine 2.2 range    Past Surgical History  Procedure Date  . Cardiac defibrillator placement   . Angioplasty   . Hernia repair     bilat  . Cardiac catheterization   . Coronary angioplasty     Stents placed  . Transthoracic echocardiogram 2005/2006/2008/2011   Jarold Motto MS, RD, LDN Pager: [phone] After-hours pager:  [phone]

## 2012-03-09 NOTE — Progress Notes (Signed)
Subjective:  Waiting to go to HD today Appears getting teed up for possible pericardial fluid drainage procedure and possible chest tube TCTS has seen  Objective Vital signs in last 24 hours: Filed Vitals:   03/08/12 1758 03/08/12 2132 03/09/12 0552 03/09/12 0813  BP: 135/89 99/63 101/58 94/61  Pulse: 80 82 80 77  Temp: 98.5 F (36.9 C) 97.7 F (36.5 C) 98.1 F (36.7 C) 97.6 F (36.4 C)  TempSrc: Oral Oral Oral Oral  Resp: 18 18 19 18   Height:      Weight:  72.5 kg (159 lb 13.3 oz)    SpO2: 100% 96% 99% 99%   Weight change: -5.4 kg (-11 lb 14.5 oz)  Intake/Output Summary (Last 24 hours) at 03/09/12 1234 Last data filed at 03/09/12 0845  Gross per 24 hour  Intake   1200 ml  Output      0 ml  Net   1200 ml   Physical Exam:  Blood pressure 94/61, pulse 77, temperature 97.6 F (36.4 C), temperature source Oral, resp. rate 18, height 5\' 5"  (1.651 m), weight 72.5 kg (159 lb 13.3 oz), SpO2 99.00%. Weights  03/08/12 1022 72.8 kg (160 lb 7.9 oz) post dialysis  03/08/12 0700 75.4 kg (166 lb 3.6 oz) pre dialysis  03/07/12 2039 75.4 kg (166 lb 3.6 oz) post dialysis  03/07/12 1936 75.4 kg (166 lb 3.6 oz) pre dialysis  03/07/12 1700 78.2 kg (172 lb 6.4 oz)  03/06/12 1958 78 kg (171 lb 15.3 oz)  03/06/12 0500 78 kg (171 lb 15.3 oz) Somewhat disheveled looking Sitting on edge of bed Right IJ temp HD cath Anasarca with sacral, abd wall and 3+ le edema S1S2 No rub Foely with clear urine  Labs: Basic Metabolic Panel:  Lab 03/08/12 9629 03/06/12 1002 03/05/12 0524 03/04/12 0447 03/03/12 1555 03/03/12 0435  NA 130* 125* 127* 126* 129* 129*  K 4.5 5.0 4.7 5.5* 5.5* 5.5*  CL 88* 83* 84* 85* 87* 89*  CO2 30 30 31 30  33* 31  GLUCOSE 142* 102* 73 200* 170* 123*  BUN 88* 114* 104* 101* 97* 94*  CREATININE 2.87* 3.30* 3.05* 3.11* 3.11* 2.83*  ALB -- -- -- -- -- --  CALCIUM 8.4 8.1* 8.1* 8.2* 8.2* 8.0*  PHOS 6.3* 7.0* 6.9* 6.3* 6.2* 6.1*   Liver Function Tests:  Lab 03/08/12 0725  03/07/12 1718 03/06/12 1002 03/05/12 0524  AST -- -- -- --  ALT -- 11 -- --  ALKPHOS -- -- -- --  BILITOT -- -- -- --  PROT -- -- -- --  ALBUMIN 2.9* -- 3.0* 2.9*  CBC:  Lab 03/08/12 0725 03/05/12 0524  WBC 7.7 7.2  NEUTROABS -- --  HGB 10.0* 10.1*  HCT 32.4* 34.7*  MCV 77.7* 79.4  PLT 195 180  CBG:  Lab 03/09/12 1156 03/09/12 0752 03/08/12 2136 03/08/12 1642 03/08/12 1108  GLUCAP 96 126* 146* 139* 124*    Iron Studies:  Lab 03/08/12 0725  IRON 22*  TIBC 266  TRANSFERRIN --  FERRITIN 131   Studies/Results: Ct Abdomen Pelvis Wo Contrast  03/08/2012  *RADIOLOGY REPORT*  Clinical Data:  Recent weight loss.  No effusions and ascites. Evaluate for malignancy.  CT CHEST, ABDOMEN AND PELVIS WITHOUT CONTRAST  Technique:  Multidetector CT imaging of the chest, abdomen and pelvis was performed following the standard protocol without IV contrast.  Comparison:  CT of the abdomen and pelvis 05/04/2009.  Chest x-ray 03/07/2012.  CT CHEST  Findings:  Large  right pleural effusion and small to moderate left pleural effusion.  Moderate pericardial effusion.  Cardiomegaly. Pacer wires in place.  Diffuse coronary artery calcifications present.  Aorta is normal caliber with calcifications in the aortic arch and great vessels.  Compressive atelectasis in the lower lobes bilaterally, right greater than left.  Small scattered mediastinal lymph nodes, none pathologically enlarged. No mediastinal, hilar, or axillary adenopathy.  Visualized thyroid and chest wall soft tissues unremarkable.  No acute bony abnormality.  IMPRESSION: Large right pleural effusion.  Small moderate left pleural effusion.  Compressive atelectasis bilaterally, right worse than left.  Cardiomegaly.  Moderate pericardial effusion.  CT ABDOMEN AND PELVIS  Findings:  Mildly nodular contours to the liver suggests cirrhosis. There is a small amount of perihepatic, perisplenic and cul-de-sac free fluid.  Urinary bladder is decompressed with  Foley catheter in place.  Bilateral inguinal hernias containing fluid and fat.  Spleen is normal size.  No visible focal abnormality on this unenhanced study.  Pancreas, adrenals and kidneys have an unremarkable unenhanced appearance.  Small nonobstructing stones in the mid and lower pole of the right kidney.  Vascular calcifications throughout the aorta and branch vessels.  No aneurysm.  Large and small bowel as well as stomach grossly unremarkable. Mild edema throughout the mesentery and subcutaneous soft tissues of the abdomen and pelvis.  No acute bony abnormality.  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  IMPRESSION: Subtle nodularity to the liver contour suggests possibility of cirrhosis.  Recommend clinical correlation.  Small amount of ascites.  Anasarca like stranding within the mesentery and subcutaneous soft tissues.  Bilateral inguinal hernias containing fat and fluid.   Original Report Authenticated By: Charlett Nose, M.D.    Dg Chest 2 View  03/07/2012  *RADIOLOGY REPORT*  Clinical Data: Pleural effusion and cough.  CHEST - 2 VIEW  Comparison: Two-view chest 03/06/2012.  Findings: The heart is enlarged.  Pacing wires are in place.  This interstitial edema is stable.  Bilateral pleural effusions are stable, right greater than left.  Associated airspace disease is evident.  The visualized soft tissues and bony thorax are unremarkable.  IMPRESSION:  1.  Stable cardiac enlargement and pulmonary edema compatible with congestive heart failure. 2.  Stable bilateral pleural effusions, right greater than left.   Original Report Authenticated By: Marin Roberts, M.D.    Medications:      . budesonide-formoterol  2 puff Inhalation BID  . calcium acetate  667 mg Oral TID WC  . darbepoetin (ARANESP) injection - DIALYSIS  40 mcg Intravenous Q Wed-HD  . doxercalciferol  1 mcg Intravenous Q M,W,F-HD  . feeding supplement  1 Container Oral Q24H  . ferric gluconate (FERRLECIT/NULECIT) IV  125 mg  Intravenous Daily  . gabapentin  100 mg Oral TID  . hydrocerin   Topical BID  . insulin aspart  0-9 Units Subcutaneous TID WC  . insulin glargine  5 Units Subcutaneous Daily  . metoprolol succinate  50 mg Oral QHS  . pantoprazole  40 mg Oral Daily  . polyethylene glycol  17 g Oral Daily  . senna-docusate  1 tablet Oral BID  . simvastatin  40 mg Oral QHS  . sodium chloride  3 mL Intravenous Q12H  . Tamsulosin HCl  0.4 mg Oral QPC supper  . tiotropium  18 mcg Inhalation Daily    I  have reviewed scheduled and prn medications.  ASSESSMENT/RECOMMEDATIONS 66 yo with baseline CKD (1.7-1.9) from DM, HTN, chronic cardiorenal syndrome, who has diuretic refractory edema  in the setting of CHF, cardiorenal exacerbation/severe RV dysfunction requiring initiation of dialysis/UF   1.  Acute renal failure on chronic kidney disease stage III For HD #3 today Temp cath placed 12/16  2nd TMT abbreviated d/t hypotension  Worry that pericardial effusion may be playing a role here as he still Has a LOT of volume on board  With severity of cardiac issues dialysis may be permanent  HD #3 tomorrow  Cards says OK to reduce metoprolol  Will decrease to 25 QHS  2. Pericardial effusion - HOPE this was not contributor to BP drop on HD   Cards/TCTS addressing   3. Pleural effusion - s/p 1.8 liter tap 12/10 Possible chest tube  4. CKD-MBD  binder started (12/16)  PTH 262  Start Hectoral 1 mcg mwf   5. Anemia - Hb 10 start ESA (darbe 60 12/18)  Start iron load daily X 5 (since don't know definite HD schedule or if will be chronic yet)   6. Ischemic cardiomyopathy with systolic and diastolic heart failure; EF 55% by echo 03/01/12; RV dysfunction/severe TR by echo as well  7. CAD with prior stenting (2006)  8. H/O AFIB with prior AV nodal ablation  9. H/O pacemaker [name])  10. COPD ([name])  11. BPH with history of urinary retentionhas foley in ([name])    Camille Bal, MD [facility] [phone] pager 03/09/2012, 12:34 PM

## 2012-03-09 NOTE — Progress Notes (Signed)
Lab called with a criical lab for aPTT = 200, called Dr. Theda Belfast Dhungel notify and requested a redraw; he ordered the redraw. Reported to floor RN Ty Willa Rough the first result and the second result.

## 2012-03-09 NOTE — Progress Notes (Signed)
*  PRELIMINARY RESULTS* Echocardiogram 2D Echocardiogram has been performed.  [name] 03/09/2012, 12:28 PM

## 2012-03-09 NOTE — Progress Notes (Signed)
PFT completed. Unconfirmed copy placed in Progress Notes of [facility]. 

## 2012-03-09 NOTE — Progress Notes (Signed)
Dr. Kathlee Nations Trigt notified of critical CO2 of 57.7. No new orders received.  Pt showing no signs of distress.

## 2012-03-09 NOTE — Progress Notes (Signed)
CRITICAL VALUE ALERT  Critical value received: CO2 - 57.7  Date of notification:  03/08/12 Time of notification: 0538  Critical value read back:yes  Nurse who received alert:  Theresa Mulligan, RN  MD notified (1st page): Donnamarie Poag  NP   Time of first page:  0540  MD notified (2nd page):  Time of second page:  Responding MD: Donnamarie Poag , NP   Time MD responded:  [phone]

## 2012-03-09 NOTE — Progress Notes (Signed)
Patient ID: [name], male   DOB: 01-28-1946, 66 y.o.   MRN: [id number] Subjective:  Dyspnea improved but still present. He feels weak. Note hypotension during dialysis. Weight is back down.  Objective:  Vital Signs in the last 24 hours: Temp:  [97.6 F (36.4 C)-98.5 F (36.9 C)] 98.1 F (36.7 C) (12/18 0552) Pulse Rate:  [71-84] 80  (12/18 0552) Resp:  [14-19] 19  (12/18 0552) BP: (83-135)/(38-89) 101/58 mmHg (12/18 0552) SpO2:  [96 %-100 %] 99 % (12/18 0552) Weight:  [159 lb 13.3 oz (72.5 kg)-160 lb 7.9 oz (72.8 kg)] 159 lb 13.3 oz (72.5 kg) (12/17 2132)  Intake/Output from previous day: 12/17 0701 - 12/18 0700 In: 1080 [P.O.:1080] Out: 2375  Intake/Output from this shift:    Physical Exam: Chronically ill appearing, cachectic, NAD HEENT: Unremarkable Neck:  7 cm JVD, no thyromegally Lungs:  Decreased breath sounds on the right greater than left, scattered rales on the right HEART:  Regular rate rhythm, no murmurs, no rubs, no clicks Abd:  Flat, positive bowel sounds, no organomegally, no rebound, no guarding Ext:  2 plus pulses, no edema, no cyanosis, no clubbing Skin:  No rashes no nodules Neuro:  CN II through XII intact, motor grossly intact  Lab Results:  Basename 03/08/12 0725  WBC 7.7  HGB 10.0*  PLT 195    Basename 03/08/12 0725 03/06/12 1002  NA 130* 125*  K 4.5 5.0  CL 88* 83*  CO2 30 30  GLUCOSE 142* 102*  BUN 88* 114*  CREATININE 2.87* 3.30*   No results found for this basename: TROPONINI:2,CK,MB:2 in the last 72 hours Hepatic Function Panel  Basename 03/08/12 0725 03/07/12 1718  PROT -- --  ALBUMIN 2.9* --  AST -- --  ALT -- 11  ALKPHOS -- --  BILITOT -- --  BILIDIR -- --  IBILI -- --   No results found for this basename: CHOL in the last 72 hours No results found for this basename: PROTIME in the last 72 hours  Imaging: Ct Abdomen Pelvis Wo Contrast  03/08/2012  *RADIOLOGY REPORT*  Clinical Data:  Recent weight loss.  No  effusions and ascites. Evaluate for malignancy.  CT CHEST, ABDOMEN AND PELVIS WITHOUT CONTRAST  Technique:  Multidetector CT imaging of the chest, abdomen and pelvis was performed following the standard protocol without IV contrast.  Comparison:  CT of the abdomen and pelvis 05/04/2009.  Chest x-ray 03/07/2012.  CT CHEST  Findings:  Large right pleural effusion and small to moderate left pleural effusion.  Moderate pericardial effusion.  Cardiomegaly. Pacer wires in place.  Diffuse coronary artery calcifications present.  Aorta is normal caliber with calcifications in the aortic arch and great vessels.  Compressive atelectasis in the lower lobes bilaterally, right greater than left.  Small scattered mediastinal lymph nodes, none pathologically enlarged. No mediastinal, hilar, or axillary adenopathy.  Visualized thyroid and chest wall soft tissues unremarkable.  No acute bony abnormality.  IMPRESSION: Large right pleural effusion.  Small moderate left pleural effusion.  Compressive atelectasis bilaterally, right worse than left.  Cardiomegaly.  Moderate pericardial effusion.  CT ABDOMEN AND PELVIS  Findings:  Mildly nodular contours to the liver suggests cirrhosis. There is a small amount of perihepatic, perisplenic and cul-de-sac free fluid.  Urinary bladder is decompressed with Foley catheter in place.  Bilateral inguinal hernias containing fluid and fat.  Spleen is normal size.  No visible focal abnormality on this unenhanced study.  Pancreas, adrenals and kidneys have an  unremarkable unenhanced appearance.  Small nonobstructing stones in the mid and lower pole of the right kidney.  Vascular calcifications throughout the aorta and branch vessels.  No aneurysm.  Large and small bowel as well as stomach grossly unremarkable. Mild edema throughout the mesentery and subcutaneous soft tissues of the abdomen and pelvis.  No acute bony abnormality.  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  IMPRESSION: Subtle nodularity to  the liver contour suggests possibility of cirrhosis.  Recommend clinical correlation.  Small amount of ascites.  Anasarca like stranding within the mesentery and subcutaneous soft tissues.  Bilateral inguinal hernias containing fat and fluid.   Original Report Authenticated By: Charlett Nose, M.D.    Dg Chest 2 View  03/07/2012  *RADIOLOGY REPORT*  Clinical Data: Pleural effusion and cough.  CHEST - 2 VIEW  Comparison: Two-view chest 03/06/2012.  Findings: The heart is enlarged.  Pacing wires are in place.  This interstitial edema is stable.  Bilateral pleural effusions are stable, right greater than left.  Associated airspace disease is evident.  The visualized soft tissues and bony thorax are unremarkable.  IMPRESSION:  1.  Stable cardiac enlargement and pulmonary edema compatible with congestive heart failure. 2.  Stable bilateral pleural effusions, right greater than left.   Original Report Authenticated By: Marin Roberts, M.D.    Ct Chest Wo Contrast  03/08/2012  *RADIOLOGY REPORT*  Clinical Data:  Recent weight loss.  No effusions and ascites. Evaluate for malignancy.  CT CHEST, ABDOMEN AND PELVIS WITHOUT CONTRAST  Technique:  Multidetector CT imaging of the chest, abdomen and pelvis was performed following the standard protocol without IV contrast.  Comparison:  CT of the abdomen and pelvis 05/04/2009.  Chest x-ray 03/07/2012.  CT CHEST  Findings:  Large right pleural effusion and small to moderate left pleural effusion.  Moderate pericardial effusion.  Cardiomegaly. Pacer wires in place.  Diffuse coronary artery calcifications present.  Aorta is normal caliber with calcifications in the aortic arch and great vessels.  Compressive atelectasis in the lower lobes bilaterally, right greater than left.  Small scattered mediastinal lymph nodes, none pathologically enlarged. No mediastinal, hilar, or axillary adenopathy.  Visualized thyroid and chest wall soft tissues unremarkable.  No acute bony  abnormality.  IMPRESSION: Large right pleural effusion.  Small moderate left pleural effusion.  Compressive atelectasis bilaterally, right worse than left.  Cardiomegaly.  Moderate pericardial effusion.  CT ABDOMEN AND PELVIS  Findings:  Mildly nodular contours to the liver suggests cirrhosis. There is a small amount of perihepatic, perisplenic and cul-de-sac free fluid.  Urinary bladder is decompressed with Foley catheter in place.  Bilateral inguinal hernias containing fluid and fat.  Spleen is normal size.  No visible focal abnormality on this unenhanced study.  Pancreas, adrenals and kidneys have an unremarkable unenhanced appearance.  Small nonobstructing stones in the mid and lower pole of the right kidney.  Vascular calcifications throughout the aorta and branch vessels.  No aneurysm.  Large and small bowel as well as stomach grossly unremarkable. Mild edema throughout the mesentery and subcutaneous soft tissues of the abdomen and pelvis.  No acute bony abnormality.  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  IMPRESSION: Subtle nodularity to the liver contour suggests possibility of cirrhosis.  Recommend clinical correlation.  Small amount of ascites.  Anasarca like stranding within the mesentery and subcutaneous soft tissues.  Bilateral inguinal hernias containing fat and fluid.   Original Report Authenticated By: Charlett Nose, M.D.    Ir Fluoro Guide Cv Line Right  03/07/2012  *RADIOLOGY REPORT*  Clinical history:66 year old with acute renal failure.  PROCEDURE(S): PLACEMENT OF DIALYSIS CATHETER WITH ULTRASOUND AND FLUOROSCOPIC GUIDANCE  Physician: Rachelle Hora. Henn, MD  Medications:None  Moderate sedation time:None  Fluoroscopy time: 0.2 minutes  Procedure:The procedure was explained to the patient.  The risks and benefits of the procedure were discussed and the patient's questions were addressed.  Informed consent was obtained from the patient.  Ultrasound confirmed a patent right internal jugular vein.  The  right side of the neck was prepped and draped in a sterile fashion.  Maximal barrier sterile technique was utilized including caps, mask, sterile gowns, sterile gloves, sterile drape, hand hygiene and skin antiseptic.  The skin was anesthetized with 1% lidocaine. 19 gauge needle was directed into the right internal jugular vein with ultrasound guidance.  Wire was advanced into the SVC and IVC.  The tract was dilated to accommodate a 20 cm Trialysis catheter.  The three lumens aspirated and flushed well. The proper amount of heparin was placed within the dialysis lumens. Catheter sutured to the skin. Fluoroscopic and ultrasound images were taken and saved for documentation.  Findings:Catheter tip at the junction of SVC and right atrium.  Complications: None  Impression:Successful placement of a temporary dialysis catheter with ultrasound and fluoroscopic guidance.   Original Report Authenticated By: Richarda Overlie, M.D.    Ir US Guide Vasc Access Right  03/07/2012  *RADIOLOGY REPORT*  Clinical history:66 year old with acute renal failure.  PROCEDURE(S): PLACEMENT OF DIALYSIS CATHETER WITH ULTRASOUND AND FLUOROSCOPIC GUIDANCE  Physician: Rachelle Hora. Henn, MD  Medications:None  Moderate sedation time:None  Fluoroscopy time: 0.2 minutes  Procedure:The procedure was explained to the patient.  The risks and benefits of the procedure were discussed and the patient's questions were addressed.  Informed consent was obtained from the patient.  Ultrasound confirmed a patent right internal jugular vein.  The right side of the neck was prepped and draped in a sterile fashion.  Maximal barrier sterile technique was utilized including caps, mask, sterile gowns, sterile gloves, sterile drape, hand hygiene and skin antiseptic.  The skin was anesthetized with 1% lidocaine. 19 gauge needle was directed into the right internal jugular vein with ultrasound guidance.  Wire was advanced into the SVC and IVC.  The tract was dilated to  accommodate a 20 cm Trialysis catheter.  The three lumens aspirated and flushed well. The proper amount of heparin was placed within the dialysis lumens. Catheter sutured to the skin. Fluoroscopic and ultrasound images were taken and saved for documentation.  Findings:Catheter tip at the junction of SVC and right atrium.  Complications: None  Impression:Successful placement of a temporary dialysis catheter with ultrasound and fluoroscopic guidance.   Original Report Authenticated By: Richarda Overlie, M.D.     Cardiac Studies: Telemetry - atrial fibrillation with biventricular pacing Assessment/Plan:  1. acute on chronic diastolic heart failure 2. chronic atrial fibrillation, status post AV node ablation 3. status post biventricular pacemaker 4. large, persistent, pleural effusion right greater than left 5. moderate to large pericardial effusion 6. end-stage renal disease, with initiation of hemodialysis 7. marked weight loss Rec: CT scan of the chest is reviewed. No evidence of malignancy. With drop in blood pressure during dialysis, agree with recommendation to consider pericardial window and placement of a chest tube. I suspect that this will not improve his propensity to hypotension. At this point, metoprolol could be reduced or discontinued if needed for blood pressure support.  LOS: 13 days    Lewayne Bunting  03/09/2012, 8:48 AM

## 2012-03-09 NOTE — Procedures (Signed)
I have personally attended this patient's dialysis session.  Less than a liter off, blood pressure into the 90's and out of UF Not tolerating ultrafiltration despite substantial volume overload No issues with right sided temp cath(12/16)   Maikel Neisler B

## 2012-03-09 NOTE — Progress Notes (Signed)
Procedure(s) (LRB): SUBXYPHOID PERICARDIAL WINDOW (N/A) CHEST TUBE INSERTION (Right) Subjective: Pericardial and R pleural effusions- hx idiopathic CM and ESRF started on HD this admit  Objective: Vital signs in last 24 hours: Temp:  [97.6 F (36.4 C)-98.5 F (36.9 C)] 97.6 F (36.4 C) (12/18 0813) Pulse Rate:  [77-86] 80  (12/18 1730) Cardiac Rhythm:  [-] Ventricular paced (12/18 1339) Resp:  [14-21] 20  (12/18 1730) BP: (87-135)/(45-89) 91/52 mmHg (12/18 1730) SpO2:  [96 %-100 %] 96 % (12/18 1430) Weight:  [159 lb 13.3 oz (72.5 kg)-169 lb 5 oz (76.8 kg)] 169 lb 5 oz (76.8 kg) (12/18 1339)  Hemodynamic parameters for last 24 hours:  stable  Intake/Output from previous day: 12/17 0701 - 12/18 0700 In: 1080 [P.O.:1080] Out: 2375  Intake/Output this shift: Total I/O In: 240 [P.O.:240] Out: -     Lab Results:CT chest w/o sign malignancy, loculated R effusion  Basename 03/09/12 1412 03/08/12 0725  WBC 8.9 7.7  HGB 10.4* 10.0*  HCT 35.0* 32.4*  PLT 201 195   BMET:  Basename 03/09/12 1412 03/08/12 0725  NA 127* 130*  K 4.5 4.5  CL 88* 88*  CO2 28 30  GLUCOSE 127* 142*  BUN 63* 88*  CREATININE 2.84* 2.87*  CALCIUM 8.6 8.4    PT/INR:  Basename 03/09/12 1412  LABPROT 19.5*  INR 1.71*   ABG    Component Value Date/Time   PHART 7.312* 03/09/2012 0500   HCO3 28.4* 03/09/2012 0500   TCO2 30.1 03/09/2012 0500   ACIDBASEDEF 1.7 05/04/2009 0434   O2SAT 98.1 03/09/2012 0500   CBG (last 3)   Basename 03/09/12 1156 03/09/12 0752 03/08/12 2136  GLUCAP 96 126* 146*    Assessment/Plan: S/P Procedure(s) (LRB): SUBXYPHOID PERICARDIAL WINDOW (N/A) CHEST TUBE INSERTION (Right) Plan surgery 12-20 Severe COPD - may need postop vent    LOS: 13 days    VAN TRIGT III,Slayton Lubitz 03/09/2012

## 2012-03-09 NOTE — Progress Notes (Signed)
[facility] PROGRESS NOTE  [name] WUJ:[id number] DOB: 1946-03-20 DOA: 03/17/2012 PCP: [name], MD  Assessment: 66 year old presents with SOB and lower extremities edema. He was diagnosed with Acute on chronic HF exacerbation. He was found to have pleural effusion and has moderate pericardial effusion with no physiologic tamponade. He develop acute on chronic renal failure. He now will require dialysis. Nephrology following patient and pending response to HD he may require pericardiocentesis.   Assessment/Plan:   Acute on chronic combined systolic and diastolic CHF (congestive heart failure) / Small right pleural effusion  -EF 50 to 55 % from echo on 12/10. Repeated today and shows mild to moderately reduced EF of 40-45 % -off lasix now. -Repeat chest x-ray 02/29/2012 showed an enlarging effusion.  thoracentesis with 1.8 L of fluid removed on 12/10. culture shows no growth.  added cytology evaluation of pleural fluid.  Appreciate cardiology recommendations.   Pericardial effusion -No obvious tamponade physiology. Hold Coumadin in anticipation of possible need for pericardiocentesis or pericardial window.  -Seen by Dr Morton Peters. Repeat echo shows some decrease in EF but no change in the size of effusion and no tamponade. Ordered for PFT. Planned for pericardiocentesis on 12/20  Acute on chronic CKD -secondary to Heart failure exacerbation, hemodynamically mediated injury and evolution of ATN. Cr baseline 1.6 to 1.9 prior records.  temp HD cath placed 03/07/12 . Getting HD here  -Urinary retention  Patient had 708 cc in bladder on bladder scan 12/10. Foley catheter placed. No hydronephrosis on renal ultrasound.  Continue with Flomax.  Can d/c home with foley, outpatient urology followup. Will need to make sure patient able to afford medications prior to discharge.  Neosporin cream to catheter area for irritation.   Microcytic anemia   consistent with Anemia  of chronic disease Hb stable  Hypokalemia / hyperkalemia  Resolved   Skin wounds  Seen by wound care nurse 02/28/2012. Benadryl cream when necessary for pruritus. Eucerin cream for maintenance of moisturization.  DM Type II / hypoglycemia  Lantus discontinued on 02/28/2012 secondary to hypoglycemia.  Continue low dose insulin  A fib Status post AV nodal ablation. Has pacemaker. Rate controlled, Coumadin on hold  For planned pericardiocentesis for 12/20. Cardio recommend holding anticoagulation for at least 1 week.   10-COPD  Stable. Continue Symbicort and Spiriva.  11-E. Coli UTI (lower urinary tract infection)  Patient completed antibiotic regimen with rocephin      Code Status: Full.  Family Communication: no family at bedside.  Disposition Plan: Home when stable.   Consultants:  Cardiology, Dr. Eden Emms.  renal CTS ( Dr Morton Peters)  Procedures:  Thoracentesis 12/10 Antibiotics:  Rocephin 02/26/2012--->02/29/12  Cipro 02/29/2012--->    HPI/Subjective: Denies any specific complains.  Objective: Filed Vitals:   03/09/12 1430 03/09/12 1442 03/09/12 1455 03/09/12 1513  BP: 94/57 91/55 97/53  87/47  Pulse: 86 86 86 85  Temp:      TempSrc:      Resp: 18 17 19 21   Height:      Weight:      SpO2: 96%       Intake/Output Summary (Last 24 hours) at 03/09/12 1537 Last data filed at 03/09/12 0845  Gross per 24 hour  Intake   1080 ml  Output      0 ml  Net   1080 ml   Filed Weights   03/08/12 1022 03/08/12 2132 03/09/12 1339  Weight: 72.8 kg (160 lb 7.9 oz) 72.5 kg (159 lb  13.3 oz) 76.8 kg (169 lb 5 oz)    Exam:   General:  Elderly male in no acute distress  HEENT: No pallor, moist oral mucosa, right-sided  HD  catheter  Cardiovascular: Normal S1 and S2, no murmurs rub or gallop  Respiratory: Diminished breath sounds bilaterally, no added sounds  Abdomen: Soft, nontender, nondistended, bowel sounds present. Foley in place  Extremities: Warm, no  edema  Cns: aaox3   Data Reviewed: Basic Metabolic Panel:  Lab 03/09/12 1610 03/08/12 0725 03/06/12 1002 03/05/12 0524 03/04/12 1540 03/04/12 0447 03/03/12 1555  NA 127* 130* 125* 127* -- 126* --  K 4.5 4.5 5.0 4.7 -- 5.5* --  CL 88* 88* 83* 84* -- 85* --  CO2 28 30 30 31  -- 30 --  GLUCOSE 127* 142* 102* 73 -- 200* --  BUN 63* 88* 114* 104* -- 101* --  CREATININE 2.84* 2.87* 3.30* 3.05* -- 3.11* --  CALCIUM 8.6 8.4 8.1* 8.1* -- 8.2* --  MG -- -- -- 2.1 2.2 -- --  PHOS -- 6.3* 7.0* 6.9* -- 6.3* 6.2*   Liver Function Tests:  Lab 03/09/12 1412 03/08/12 0725 03/07/12 1718 03/06/12 1002 03/05/12 0524 03/04/12 0447  AST 24 -- -- -- -- --  ALT 13 -- 11 -- -- --  ALKPHOS 84 -- -- -- -- --  BILITOT 0.6 -- -- -- -- --  PROT 7.9 -- -- -- -- --  ALBUMIN 2.9* 2.9* -- 3.0* 2.9* 2.8*   No results found for this basename: LIPASE:5,AMYLASE:5 in the last 168 hours No results found for this basename: AMMONIA:5 in the last 168 hours CBC:  Lab 03/09/12 1412 03/08/12 0725 03/05/12 0524  WBC 8.9 7.7 7.2  NEUTROABS 7.2 -- --  HGB 10.4* 10.0* 10.1*  HCT 35.0* 32.4* 34.7*  MCV 80.1 77.7* 79.4  PLT 201 195 180   Cardiac Enzymes: No results found for this basename: CKTOTAL:5,CKMB:5,CKMBINDEX:5,TROPONINI:5 in the last 168 hours BNP (last 3 results)  Basename 02/29/12 0505 02/26/12 0105 04/07/11 0934  PROBNP 8263.0* 8685.0* 205.0*   CBG:  Lab 03/09/12 1156 03/09/12 0752 03/08/12 2136 03/08/12 1642 03/08/12 1108  GLUCAP 96 126* 146* 139* 124*    Recent Results (from the past 240 hour(s))  BODY FLUID CULTURE     Status: Normal   Collection Time   03/01/12  2:09 PM      Component Value Range Status Comment   Specimen Description PLEURAL RIGHT EFFUSION   Final    Special Requests Normal   Final    Gram Stain     Final    Value: RARE WBC PRESENT, PREDOMINANTLY PMN     NO ORGANISMS SEEN   Culture NO GROWTH 3 DAYS   Final    Report Status 03/05/2012 FINAL   Final   URINE CULTURE      Status: Normal   Collection Time   03/02/12  3:14 PM      Component Value Range Status Comment   Specimen Description URINE, CLEAN CATCH   Final    Special Requests NONE   Final    Culture  Setup Time 03/03/2012 01:27   Final    Colony Count NO GROWTH   Final    Culture NO GROWTH   Final    Report Status 03/04/2012 FINAL   Final      Studies: Ct Abdomen Pelvis Wo Contrast  03/08/2012  *RADIOLOGY REPORT*  Clinical Data:  Recent weight loss.  No effusions and ascites. Evaluate for  malignancy.  CT CHEST, ABDOMEN AND PELVIS WITHOUT CONTRAST  Technique:  Multidetector CT imaging of the chest, abdomen and pelvis was performed following the standard protocol without IV contrast.  Comparison:  CT of the abdomen and pelvis 05/04/2009.  Chest x-ray 03/07/2012.  CT CHEST  Findings:  Large right pleural effusion and small to moderate left pleural effusion.  Moderate pericardial effusion.  Cardiomegaly. Pacer wires in place.  Diffuse coronary artery calcifications present.  Aorta is normal caliber with calcifications in the aortic arch and great vessels.  Compressive atelectasis in the lower lobes bilaterally, right greater than left.  Small scattered mediastinal lymph nodes, none pathologically enlarged. No mediastinal, hilar, or axillary adenopathy.  Visualized thyroid and chest wall soft tissues unremarkable.  No acute bony abnormality.  IMPRESSION: Large right pleural effusion.  Small moderate left pleural effusion.  Compressive atelectasis bilaterally, right worse than left.  Cardiomegaly.  Moderate pericardial effusion.  CT ABDOMEN AND PELVIS  Findings:  Mildly nodular contours to the liver suggests cirrhosis. There is a small amount of perihepatic, perisplenic and cul-de-sac free fluid.  Urinary bladder is decompressed with Foley catheter in place.  Bilateral inguinal hernias containing fluid and fat.  Spleen is normal size.  No visible focal abnormality on this unenhanced study.  Pancreas, adrenals and  kidneys have an unremarkable unenhanced appearance.  Small nonobstructing stones in the mid and lower pole of the right kidney.  Vascular calcifications throughout the aorta and branch vessels.  No aneurysm.  Large and small bowel as well as stomach grossly unremarkable. Mild edema throughout the mesentery and subcutaneous soft tissues of the abdomen and pelvis.  No acute bony abnormality.  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  IMPRESSION: Subtle nodularity to the liver contour suggests possibility of cirrhosis.  Recommend clinical correlation.  Small amount of ascites.  Anasarca like stranding within the mesentery and subcutaneous soft tissues.  Bilateral inguinal hernias containing fat and fluid.   Original Report Authenticated By: Charlett Nose, M.D.    Dg Chest 2 View  03/07/2012  *RADIOLOGY REPORT*  Clinical Data: Pleural effusion and cough.  CHEST - 2 VIEW  Comparison: Two-view chest 03/06/2012.  Findings: The heart is enlarged.  Pacing wires are in place.  This interstitial edema is stable.  Bilateral pleural effusions are stable, right greater than left.  Associated airspace disease is evident.  The visualized soft tissues and bony thorax are unremarkable.  IMPRESSION:  1.  Stable cardiac enlargement and pulmonary edema compatible with congestive heart failure. 2.  Stable bilateral pleural effusions, right greater than left.   Original Report Authenticated By: Marin Roberts, M.D.    Ct Chest Wo Contrast  03/09/2012  *RADIOLOGY REPORT*  Clinical Data: Shortness of breath.  CT CHEST WITHOUT CONTRAST  Technique:  Multidetector CT imaging of the chest was performed following the standard protocol without IV contrast.  Comparison: 03/08/2012.  Findings: Mediastinal lymph nodes are borderline enlarged, measuring up to 1.2 cm.  Only one of these contains a fatty hilum in the low right paratracheal station.  Hilar regions are difficult to definitively evaluate without IV contrast.  No axillary adenopathy.   Atherosclerotic calcification of the arterial vasculature, including coronary arteries.  Pulmonary arteries and heart are enlarged.  Moderate pericardial effusion.  Collapse/consolidation in the right middle and right lower lobes. Moderate to large right pleural effusion.  Small left pleural effusion with compressive atelectasis in the left lower lobe. There are a few scattered tiny nodular densities in the lungs bilaterally, measuring  less than 4 mm in size.  Minimal subpleural ground-glass in the anterior left upper lobe.  Debris is seen in the airway, dependently.  Incidental imaging of the upper abdomen shows ascites.  Retrocrural lymph nodes measure up to 9 mm.  Gastrohepatic ligament lymph nodes are sub centimeter in size.  Liver margin appears somewhat irregular.  No worrisome lytic or sclerotic lesions.  IMPRESSION:  1.  Moderate to large right pleural effusion with associated collapse/consolidation in the right middle and right lower lobes. Right lower lobe pneumonia is strongly favored.  Follow-up to clearing is recommended. 2.  Small left pleural effusion with compressive atelectasis in the left lower lobe. 3.  Moderate pericardial effusion, stable. 4.  Mediastinal lymph nodes may be reactive. 5. 6.  Scattered tiny pulmonary nodular densities.  Continued attention on follow-up exams is warranted. 7.  Pulmonary arterial hypertension and extensive coronary artery calcification. 8.  Favor cirrhosis. 7.  Ascites.   Original Report Authenticated By: Leanna Battles, M.D.    Ct Chest Wo Contrast  03/08/2012  *RADIOLOGY REPORT*  Clinical Data:  Recent weight loss.  No effusions and ascites. Evaluate for malignancy.  CT CHEST, ABDOMEN AND PELVIS WITHOUT CONTRAST  Technique:  Multidetector CT imaging of the chest, abdomen and pelvis was performed following the standard protocol without IV contrast.  Comparison:  CT of the abdomen and pelvis 05/04/2009.  Chest x-ray 03/07/2012.  CT CHEST  Findings:  Large right  pleural effusion and small to moderate left pleural effusion.  Moderate pericardial effusion.  Cardiomegaly. Pacer wires in place.  Diffuse coronary artery calcifications present.  Aorta is normal caliber with calcifications in the aortic arch and great vessels.  Compressive atelectasis in the lower lobes bilaterally, right greater than left.  Small scattered mediastinal lymph nodes, none pathologically enlarged. No mediastinal, hilar, or axillary adenopathy.  Visualized thyroid and chest wall soft tissues unremarkable.  No acute bony abnormality.  IMPRESSION: Large right pleural effusion.  Small moderate left pleural effusion.  Compressive atelectasis bilaterally, right worse than left.  Cardiomegaly.  Moderate pericardial effusion.  CT ABDOMEN AND PELVIS  Findings:  Mildly nodular contours to the liver suggests cirrhosis. There is a small amount of perihepatic, perisplenic and cul-de-sac free fluid.  Urinary bladder is decompressed with Foley catheter in place.  Bilateral inguinal hernias containing fluid and fat.  Spleen is normal size.  No visible focal abnormality on this unenhanced study.  Pancreas, adrenals and kidneys have an unremarkable unenhanced appearance.  Small nonobstructing stones in the mid and lower pole of the right kidney.  Vascular calcifications throughout the aorta and branch vessels.  No aneurysm.  Large and small bowel as well as stomach grossly unremarkable. Mild edema throughout the mesentery and subcutaneous soft tissues of the abdomen and pelvis.  No acute bony abnormality.  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  IMPRESSION: Subtle nodularity to the liver contour suggests possibility of cirrhosis.  Recommend clinical correlation.  Small amount of ascites.  Anasarca like stranding within the mesentery and subcutaneous soft tissues.  Bilateral inguinal hernias containing fat and fluid.   Original Report Authenticated By: Charlett Nose, M.D.     Scheduled Meds:   . budesonide-formoterol   2 puff Inhalation BID  . calcium acetate  667 mg Oral TID WC  . darbepoetin (ARANESP) injection - DIALYSIS  40 mcg Intravenous Q Wed-HD  . doxercalciferol  1 mcg Intravenous Q M,W,F-HD  . feeding supplement  1 Container Oral Q24H  . ferric gluconate (FERRLECIT/NULECIT) IV  125 mg Intravenous Daily  . gabapentin  100 mg Oral TID  . hydrocerin   Topical BID  . insulin aspart  0-9 Units Subcutaneous TID WC  . insulin glargine  5 Units Subcutaneous Daily  . metoprolol succinate  25 mg Oral QHS  . pantoprazole  40 mg Oral Daily  . polyethylene glycol  17 g Oral Daily  . senna-docusate  1 tablet Oral BID  . simvastatin  40 mg Oral QHS  . sodium chloride  3 mL Intravenous Q12H  . Tamsulosin HCl  0.4 mg Oral QPC supper  . tiotropium  18 mcg Inhalation Daily   Continuous Infusions:     Time spent: 30 MINUTES    Silvana Holecek  [facility] Pager [phone] If 8PM-8AM, please contact night-coverage at [email or web address], password [facility] 03/09/2012, 3:37 PM  LOS: 13 days

## 2012-03-10 ENCOUNTER — Ambulatory Visit: Payer: Medicare Other | Admitting: Internal Medicine

## 2012-03-10 ENCOUNTER — Inpatient Hospital Stay (HOSPITAL_COMMUNITY): Payer: Medicare Other

## 2012-03-10 LAB — BLOOD GAS, ARTERIAL
Acid-Base Excess: 2.6 mmol/L — ABNORMAL HIGH (ref 0.0–2.0)
Bicarbonate: 27.6 mEq/L — ABNORMAL HIGH (ref 20.0–24.0)
Drawn by: 36274
FIO2: 21 %
O2 Saturation: 84.9 %
Patient temperature: 98.6
TCO2: 29.1 mmol/L (ref 0–100)
pCO2 arterial: 50.4 mmHg — ABNORMAL HIGH (ref 35.0–45.0)
pH, Arterial: 7.357 (ref 7.350–7.450)
pO2, Arterial: 49.7 mmHg — ABNORMAL LOW (ref 80.0–100.0)

## 2012-03-10 LAB — GLUCOSE, CAPILLARY
Glucose-Capillary: 126 mg/dL — ABNORMAL HIGH (ref 70–99)
Glucose-Capillary: 142 mg/dL — ABNORMAL HIGH (ref 70–99)
Glucose-Capillary: 136 mg/dL — ABNORMAL HIGH (ref 70–99)

## 2012-03-10 LAB — COMPREHENSIVE METABOLIC PANEL
ALT: 13 U/L (ref 0–53)
AST: 21 U/L (ref 0–37)
Albumin: 2.7 g/dL — ABNORMAL LOW (ref 3.5–5.2)
Alkaline Phosphatase: 81 U/L (ref 39–117)
BUN: 41 mg/dL — ABNORMAL HIGH (ref 6–23)
CO2: 23 mEq/L (ref 19–32)
Calcium: 8.6 mg/dL (ref 8.4–10.5)
Chloride: 91 mEq/L — ABNORMAL LOW (ref 96–112)
Creatinine, Ser: 2.87 mg/dL — ABNORMAL HIGH (ref 0.50–1.35)
GFR calc Af Amer: 25 mL/min — ABNORMAL LOW (ref >90)
GFR calc non Af Amer: 21 mL/min — ABNORMAL LOW (ref >90)
Glucose, Bld: 156 mg/dL — ABNORMAL HIGH (ref 70–99)
Potassium: 4.5 mEq/L (ref 3.5–5.1)
Sodium: 128 mEq/L — ABNORMAL LOW (ref 135–145)
Total Bilirubin: 0.5 mg/dL (ref 0.3–1.2)
Total Protein: 7.6 g/dL (ref 6.0–8.3)

## 2012-03-10 LAB — CBC
HCT: 32.5 % — ABNORMAL LOW (ref 39.0–52.0)
HCT: 33.6 % — ABNORMAL LOW (ref 39.0–52.0)
Hemoglobin: 10 g/dL — ABNORMAL LOW (ref 13.0–17.0)
MCH: 23.5 pg — ABNORMAL LOW (ref 26.0–34.0)
MCH: 23.9 pg — ABNORMAL LOW (ref 26.0–34.0)
MCHC: 29.5 g/dL — ABNORMAL LOW (ref 30.0–36.0)
MCHC: 29.8 g/dL — ABNORMAL LOW (ref 30.0–36.0)
MCV: 80.4 fL (ref 78.0–100.0)
Platelets: 171 10*3/uL (ref 150–400)
RBC: 4.18 MIL/uL — ABNORMAL LOW (ref 4.22–5.81)
RDW: 18.8 % — ABNORMAL HIGH (ref 11.5–15.5)
RDW: 19.4 % — ABNORMAL HIGH (ref 11.5–15.5)
WBC: 6.9 10*3/uL (ref 4.0–10.5)

## 2012-03-10 LAB — RENAL FUNCTION PANEL
Calcium: 8.4 mg/dL (ref 8.4–10.5)
Creatinine, Ser: 2.37 mg/dL — ABNORMAL HIGH (ref 0.50–1.35)
GFR calc Af Amer: 31 mL/min — ABNORMAL LOW (ref >90)
GFR calc non Af Amer: 27 mL/min — ABNORMAL LOW (ref >90)
Glucose, Bld: 116 mg/dL — ABNORMAL HIGH (ref 70–99)
Phosphorus: 3.6 mg/dL (ref 2.3–4.6)
Sodium: 130 mEq/L — ABNORMAL LOW (ref 135–145)

## 2012-03-10 LAB — URINE CULTURE
Colony Count: NO GROWTH
Culture: NO GROWTH

## 2012-03-10 LAB — TYPE AND SCREEN
ABO/RH(D): O POS
Antibody Screen: NEGATIVE

## 2012-03-10 LAB — PROTIME-INR
INR: 1.49 (ref 0.00–1.49)
Prothrombin Time: 17.6 seconds — ABNORMAL HIGH (ref 11.6–15.2)

## 2012-03-10 LAB — ABO/RH: ABO/RH(D): O POS

## 2012-03-10 LAB — APTT: aPTT: 67 seconds — ABNORMAL HIGH (ref 24–37)

## 2012-03-10 MED ORDER — DEXTROSE 5 % IV SOLN
1.5000 g | INTRAVENOUS | Status: AC
  Administered 2012-03-11: 1.5 g via INTRAVENOUS
  Filled 2012-03-10: qty 1.5

## 2012-03-10 NOTE — Progress Notes (Signed)
[facility] PROGRESS NOTE  [name] VWU:[id number] DOB: 1945/06/20 DOA: 03-26-12 PCP: [name], MD  Brief narrative: 66 year old presents with SOB and lower extremities edema. He was diagnosed with Acute on chronic HF exacerbation. He was found to have pleural effusion and has moderate pericardial effusion with no physiologic tamponade. He develop acute on chronic renal failure. He now will require dialysis. Nephrology following patient . Patient needs pericardiocentesis.   Assessment/Plan:  Acute on chronic combined systolic and diastolic CHF (congestive heart failure) / Small right pleural effusion  -EF 50 to 55 % from echo on 12/10. Repeated on 12/18 and shows mild to moderately reduced EF of 40-45 %  -off lasix now.  -chest x-ray 02/29/2012 showed an enlarging effusion. thoracentesis with 1.8 L of fluid removed on 12/10. culture shows no growth. added cytology evaluation of pleural fluid. A repeat chest CT on 12/17 still shows moderate to large size rt sided pleural effusion. Will likely need repeat pleural tap once pericardiocentesis done. Appreciate cardiology recommendations.  Pericardial effusion  -No obvious tamponade physiology. Hold Coumadin for pericardiocentesis or pericardial window.  -Seen by Dr Morton Peters. Repeat echo shows some decrease in EF but no change in the size of effusion and no tamponade. FT done . Planned for pericardiocentesis on 12/20   Acute on chronic CKD  -secondary to Heart failure exacerbation, hemodynamically mediated injury and evolution of ATN. Cr baseline 1.6 to 1.9 prior records.  Temporary  HD cath placed 03/07/12 . Getting HD here   -Urinary retention  Patient had 708 cc in bladder on bladder scan 12/10. Foley catheter placed. No hydronephrosis on renal ultrasound.  Continue with Flomax.  Can d/c home with foley, outpatient urology followup. Will need to make sure patient able to afford medications prior to discharge.   Neosporin cream to catheter area for irritation.   Microcytic anemia  consistent with Anemia of chronic disease  Hb stable   Hypokalemia / hyperkalemia  Resolved    Hyponatremia  adjusting with HD  Skin wounds  Seen by wound care nurse 02/28/2012. Benadryl cream when necessary for pruritus. Eucerin cream for maintenance of moisturization.   DM Type II / hypoglycemia  Lantus discontinued on 02/28/2012 secondary to hypoglycemia.  Continue low dose insulin   A fib  Status post AV nodal ablation. Has pacemaker. Rate controlled, Coumadin on hold For planned pericardiocentesis for 12/20. Cardio recommend holding anticoagulation for at least 1 week.   COPD  Stable. Continue Symbicort and Spiriva.   -E. Coli UTI (lower urinary tract infection)  Patient completed antibiotic regimen with rocephin   Code Status: Full.  Family Communication: no family at bedside.  Disposition Plan: Home when stable.   Consultants:  Cardiology, Dr. Eden Emms.  renal  CTS ( Dr Morton Peters)   Procedures:  Thoracentesis 12/10.  For pericardiocentesis on 12/20  Antibiotics:  Rocephin 02/26/2012--->02/29/12  Cipro 02/29/2012--->   HPI/Subjective:  Denies any specific complains.   Objective: Filed Vitals:   03/09/12 2200 03/10/12 0441 03/10/12 0956 03/10/12 1031  BP: 80/50 98/54 101/54   Pulse:  79 81   Temp:  98.4 F (36.9 C) 98.5 F (36.9 C)   TempSrc:  Oral Oral   Resp:  18 18   Height:      Weight:      SpO2:  100% 96% 95%    Intake/Output Summary (Last 24 hours) at 03/10/12 1126 Last data filed at 03/10/12 0900  Gross per 24 hour  Intake  840 ml  Output   1117 ml  Net   -277 ml   Filed Weights   03/09/12 1339 03/09/12 1738 03/09/12 2144  Weight: 76.8 kg (169 lb 5 oz) 74.9 kg (165 lb 2 oz) 74.9 kg (165 lb 2 oz)    Exam:  General: Elderly male in no acute distress  HEENT: No pallor, moist oral mucosa, right-sided HD catheter  Cardiovascular: Normal S1 and S2, no murmurs rub  or gallop  Respiratory: Diminished breath sounds bilaterally R>>L, no added sounds  Abdomen: Soft, nontender, nondistended, bowel sounds present. Foley in place  Extremities: Warm, no edema  Cns: [id number]   Data Reviewed: Basic Metabolic Panel:  Lab 03/10/12 1610 03/09/12 1412 03/08/12 0725 03/06/12 1002 03/05/12 0524 03/04/12 1540 03/04/12 0447  NA 130* 127* 130* 125* 127* -- --  K 4.3 4.5 4.5 5.0 4.7 -- --  CL 92* 88* 88* 83* 84* -- --  CO2 29 28 30 30 31  -- --  GLUCOSE 116* 127* 142* 102* 73 -- --  BUN 36* 63* 88* 114* 104* -- --  CREATININE 2.37* 2.84* 2.87* 3.30* 3.05* -- --  CALCIUM 8.4 8.6 8.4 8.1* 8.1* -- --  MG -- -- -- -- 2.1 2.2 --  PHOS 3.6 -- 6.3* 7.0* 6.9* -- 6.3*   Liver Function Tests:  Lab 03/10/12 0610 03/09/12 1412 03/08/12 0725 03/07/12 1718 03/06/12 1002 03/05/12 0524  AST -- 24 -- -- -- --  ALT -- 13 -- 11 -- --  ALKPHOS -- 84 -- -- -- --  BILITOT -- 0.6 -- -- -- --  PROT -- 7.9 -- -- -- --  ALBUMIN 2.7* 2.9* 2.9* -- 3.0* 2.9*   No results found for this basename: LIPASE:5,AMYLASE:5 in the last 168 hours No results found for this basename: AMMONIA:5 in the last 168 hours CBC:  Lab 03/10/12 0610 03/09/12 1412 03/08/12 0725 03/05/12 0524  WBC 7.8 8.9 7.7 7.2  NEUTROABS -- 7.2 -- --  HGB 9.6* 10.4* 10.0* 10.1*  HCT 32.5* 35.0* 32.4* 34.7*  MCV 79.5 80.1 77.7* 79.4  PLT 191 201 195 180   Cardiac Enzymes: No results found for this basename: CKTOTAL:5,CKMB:5,CKMBINDEX:5,TROPONINI:5 in the last 168 hours BNP (last 3 results)  Basename 02/29/12 0505 02/26/12 0105 04/07/11 0934  PROBNP 8263.0* 8685.0* 205.0*   CBG:  Lab 03/10/12 0800 03/09/12 2142 03/09/12 1828 03/09/12 1156 03/09/12 0752  GLUCAP 126* 126* 116* 96 126*    Recent Results (from the past 240 hour(s))  BODY FLUID CULTURE     Status: Normal   Collection Time   03/01/12  2:09 PM      Component Value Range Status Comment   Specimen Description PLEURAL RIGHT EFFUSION   Final    Special  Requests Normal   Final    Gram Stain     Final    Value: RARE WBC PRESENT, PREDOMINANTLY PMN     NO ORGANISMS SEEN   Culture NO GROWTH 3 DAYS   Final    Report Status 03/05/2012 FINAL   Final   URINE CULTURE     Status: Normal   Collection Time   03/02/12  3:14 PM      Component Value Range Status Comment   Specimen Description URINE, CLEAN CATCH   Final    Special Requests NONE   Final    Culture  Setup Time 03/03/2012 01:27   Final    Colony Count NO GROWTH   Final    Culture NO GROWTH  Final    Report Status 03/04/2012 FINAL   Final      Studies: Ct Abdomen Pelvis Wo Contrast  03/08/2012  *RADIOLOGY REPORT*  Clinical Data:  Recent weight loss.  No effusions and ascites. Evaluate for malignancy.  CT CHEST, ABDOMEN AND PELVIS WITHOUT CONTRAST  Technique:  Multidetector CT imaging of the chest, abdomen and pelvis was performed following the standard protocol without IV contrast.  Comparison:  CT of the abdomen and pelvis 05/04/2009.  Chest x-ray 03/07/2012.  CT CHEST  Findings:  Large right pleural effusion and small to moderate left pleural effusion.  Moderate pericardial effusion.  Cardiomegaly. Pacer wires in place.  Diffuse coronary artery calcifications present.  Aorta is normal caliber with calcifications in the aortic arch and great vessels.  Compressive atelectasis in the lower lobes bilaterally, right greater than left.  Small scattered mediastinal lymph nodes, none pathologically enlarged. No mediastinal, hilar, or axillary adenopathy.  Visualized thyroid and chest wall soft tissues unremarkable.  No acute bony abnormality.  IMPRESSION: Large right pleural effusion.  Small moderate left pleural effusion.  Compressive atelectasis bilaterally, right worse than left.  Cardiomegaly.  Moderate pericardial effusion.  CT ABDOMEN AND PELVIS  Findings:  Mildly nodular contours to the liver suggests cirrhosis. There is a small amount of perihepatic, perisplenic and cul-de-sac free fluid.   Urinary bladder is decompressed with Foley catheter in place.  Bilateral inguinal hernias containing fluid and fat.  Spleen is normal size.  No visible focal abnormality on this unenhanced study.  Pancreas, adrenals and kidneys have an unremarkable unenhanced appearance.  Small nonobstructing stones in the mid and lower pole of the right kidney.  Vascular calcifications throughout the aorta and branch vessels.  No aneurysm.  Large and small bowel as well as stomach grossly unremarkable. Mild edema throughout the mesentery and subcutaneous soft tissues of the abdomen and pelvis.  No acute bony abnormality.  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  IMPRESSION: Subtle nodularity to the liver contour suggests possibility of cirrhosis.  Recommend clinical correlation.  Small amount of ascites.  Anasarca like stranding within the mesentery and subcutaneous soft tissues.  Bilateral inguinal hernias containing fat and fluid.   Original Report Authenticated By: Charlett Nose, M.D.    Ct Chest Wo Contrast  03/09/2012  *RADIOLOGY REPORT*  Clinical Data: Shortness of breath.  CT CHEST WITHOUT CONTRAST  Technique:  Multidetector CT imaging of the chest was performed following the standard protocol without IV contrast.  Comparison: 03/08/2012.  Findings: Mediastinal lymph nodes are borderline enlarged, measuring up to 1.2 cm.  Only one of these contains a fatty hilum in the low right paratracheal station.  Hilar regions are difficult to definitively evaluate without IV contrast.  No axillary adenopathy.  Atherosclerotic calcification of the arterial vasculature, including coronary arteries.  Pulmonary arteries and heart are enlarged.  Moderate pericardial effusion.  Collapse/consolidation in the right middle and right lower lobes. Moderate to large right pleural effusion.  Small left pleural effusion with compressive atelectasis in the left lower lobe. There are a few scattered tiny nodular densities in the lungs bilaterally,  measuring less than 4 mm in size.  Minimal subpleural ground-glass in the anterior left upper lobe.  Debris is seen in the airway, dependently.  Incidental imaging of the upper abdomen shows ascites.  Retrocrural lymph nodes measure up to 9 mm.  Gastrohepatic ligament lymph nodes are sub centimeter in size.  Liver margin appears somewhat irregular.  No worrisome lytic or sclerotic lesions.  IMPRESSION:  1.  Moderate to large right pleural effusion with associated collapse/consolidation in the right middle and right lower lobes. Right lower lobe pneumonia is strongly favored.  Follow-up to clearing is recommended. 2.  Small left pleural effusion with compressive atelectasis in the left lower lobe. 3.  Moderate pericardial effusion, stable. 4.  Mediastinal lymph nodes may be reactive. 5. 6.  Scattered tiny pulmonary nodular densities.  Continued attention on follow-up exams is warranted. 7.  Pulmonary arterial hypertension and extensive coronary artery calcification. 8.  Favor cirrhosis. 7.  Ascites.   Original Report Authenticated By: Leanna Battles, M.D.    Ct Chest Wo Contrast  03/08/2012  *RADIOLOGY REPORT*  Clinical Data:  Recent weight loss.  No effusions and ascites. Evaluate for malignancy.  CT CHEST, ABDOMEN AND PELVIS WITHOUT CONTRAST  Technique:  Multidetector CT imaging of the chest, abdomen and pelvis was performed following the standard protocol without IV contrast.  Comparison:  CT of the abdomen and pelvis 05/04/2009.  Chest x-ray 03/07/2012.  CT CHEST  Findings:  Large right pleural effusion and small to moderate left pleural effusion.  Moderate pericardial effusion.  Cardiomegaly. Pacer wires in place.  Diffuse coronary artery calcifications present.  Aorta is normal caliber with calcifications in the aortic arch and great vessels.  Compressive atelectasis in the lower lobes bilaterally, right greater than left.  Small scattered mediastinal lymph nodes, none pathologically enlarged. No  mediastinal, hilar, or axillary adenopathy.  Visualized thyroid and chest wall soft tissues unremarkable.  No acute bony abnormality.  IMPRESSION: Large right pleural effusion.  Small moderate left pleural effusion.  Compressive atelectasis bilaterally, right worse than left.  Cardiomegaly.  Moderate pericardial effusion.  CT ABDOMEN AND PELVIS  Findings:  Mildly nodular contours to the liver suggests cirrhosis. There is a small amount of perihepatic, perisplenic and cul-de-sac free fluid.  Urinary bladder is decompressed with Foley catheter in place.  Bilateral inguinal hernias containing fluid and fat.  Spleen is normal size.  No visible focal abnormality on this unenhanced study.  Pancreas, adrenals and kidneys have an unremarkable unenhanced appearance.  Small nonobstructing stones in the mid and lower pole of the right kidney.  Vascular calcifications throughout the aorta and branch vessels.  No aneurysm.  Large and small bowel as well as stomach grossly unremarkable. Mild edema throughout the mesentery and subcutaneous soft tissues of the abdomen and pelvis.  No acute bony abnormality.  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  IMPRESSION: Subtle nodularity to the liver contour suggests possibility of cirrhosis.  Recommend clinical correlation.  Small amount of ascites.  Anasarca like stranding within the mesentery and subcutaneous soft tissues.  Bilateral inguinal hernias containing fat and fluid.   Original Report Authenticated By: Charlett Nose, M.D.     Scheduled Meds:   . budesonide-formoterol  2 puff Inhalation BID  . calcium acetate  667 mg Oral TID WC  . darbepoetin (ARANESP) injection - DIALYSIS  40 mcg Intravenous Q Wed-HD  . doxercalciferol  1 mcg Intravenous Q M,W,F-HD  . feeding supplement  1 Container Oral Q24H  . ferric gluconate (FERRLECIT/NULECIT) IV  125 mg Intravenous Daily  . gabapentin  100 mg Oral TID  . hydrocerin   Topical BID  . insulin aspart  0-9 Units Subcutaneous TID WC  .  insulin glargine  5 Units Subcutaneous Daily  . metoprolol succinate  25 mg Oral QHS  . pantoprazole  40 mg Oral Daily  . polyethylene glycol  17 g Oral Daily  . senna-docusate  1 tablet Oral BID  . simvastatin  40 mg Oral QHS  . sodium chloride  3 mL Intravenous Q12H  . Tamsulosin HCl  0.4 mg Oral QPC supper  . tiotropium  18 mcg Inhalation Daily   Continuous Infusions:     Time spent: 25 minutes    Anaira Seay  [facility] Pager [phone]. If 8PM-8AM, please contact night-coverage at [email or web address], password [email or web address] 03/10/2012, 11:26 AM  LOS: 14 days

## 2012-03-10 NOTE — Progress Notes (Signed)
Subjective:  POORLY TOLERANT OF hd YESTERDAY DUE TO HYPOTENSION Feels SOB with exertion To have pericardial window and right chest tube insertion tomorrow afternoon per Dr. Donata Clay  Objective Vital signs in last 24 hours: Filed Vitals:   03/09/12 2044 03/09/12 2144 03/09/12 2200 03/10/12 0441  BP:  87/55 80/50 98/54   Pulse: 82 79  79  Temp:  97.9 F (36.6 C)  98.4 F (36.9 C)  TempSrc:  Oral  Oral  Resp: 18 18  18   Height:      Weight:  74.9 kg (165 lb 2 oz)    SpO2: 98% 98%  100%   Weight change: 4 kg (8 lb 13.1 oz)  Intake/Output Summary (Last 24 hours) at 03/10/12 0938 Last data filed at 03/10/12 0700  Gross per 24 hour  Intake    600 ml  Output   1117 ml  Net   -517 ml   Physical Exam:  Blood pressure 98/54, pulse 79, temperature 98.4 F (36.9 C), temperature source Oral, resp. rate 18, height 5\' 5"  (1.651 m), weight 74.9 kg (165 lb 2 oz), SpO2 100.00%.  Weights  03/09/12 74.9 ( Post dialysis) 03/09/12          76.8  Pre dialysis 03/08/12 1022 72.8 kg (160 lb 7.9 oz) post dialysis  03/08/12 0700 75.4 kg (166 lb 3.6 oz) pre dialysis  03/07/12 2039 75.4 kg (166 lb 3.6 oz) post dialysis  03/07/12 1936 75.4 kg (166 lb 3.6 oz) pre dialysis  03/07/12 1700 78.2 kg (172 lb 6.4 oz)  03/06/12 1958 78 kg (171 lb 15.3 oz)  03/06/12 0500 78 kg (171 lb 15.3 oz)  Somewhat disheveled  Very pleasant Sitting on edge of bed  Right IJ temp HD cath dressing intact Anasarca with sacral, abd wall and 3+ leg edema  S1S2 No rub  Breath sounds markedly diminished at right base Abdomen not focally tender 3+edema legs with numerous excoriations  Labs: Basic Metabolic Panel:  Lab 03/10/12 0981 03/09/12 1412 03/08/12 0725 03/06/12 1002 03/05/12 0524 03/04/12 0447 03/03/12 1555  NA 130* 127* 130* 125* 127* 126* 129*  K 4.3 4.5 4.5 5.0 4.7 5.5* 5.5*  CL 92* 88* 88* 83* 84* 85* 87*  CO2 29 28 30 30 31 30  33*  GLUCOSE 116* 127* 142* 102* 73 200* 170*  BUN 36* 63* 88* 114* 104* 101*  97*  CREATININE 2.37* 2.84* 2.87* 3.30* 3.05* 3.11* 3.11*  ALB -- -- -- -- -- -- --  CALCIUM 8.4 8.6 8.4 8.1* 8.1* 8.2* 8.2*  PHOS 3.6 -- 6.3* 7.0* 6.9* 6.3* 6.2*   Liver Function Tests:  Lab 03/10/12 0610 03/09/12 1412 03/08/12 0725 03/07/12 1718  AST -- 24 -- --  ALT -- 13 -- 11  ALKPHOS -- 84 -- --  BILITOT -- 0.6 -- --  PROT -- 7.9 -- --  ALBUMIN 2.7* 2.9* 2.9* --  CBC:  Lab 03/10/12 0610 03/09/12 1412 03/08/12 0725 03/05/12 0524  WBC 7.8 8.9 7.7 7.2  NEUTROABS -- 7.2 -- --  HGB 9.6* 10.4* 10.0* 10.1*  HCT 32.5* 35.0* 32.4* 34.7*  MCV 79.5 80.1 77.7* 79.4  PLT 191 201 195 180  CBG:  Lab 03/10/12 0800 03/09/12 2142 03/09/12 1828 03/09/12 1156 03/09/12 0752  GLUCAP 126* 126* 116* 96 126*    Iron Studies:  Lab 03/08/12 0725  IRON 22*  TIBC 266  TRANSFERRIN --  FERRITIN 131   Studies/Results: Ct Abdomen Pelvis Wo Contrast  03/08/2012  *RADIOLOGY REPORT*  Clinical Data:  Recent weight loss.  No effusions and ascites. Evaluate for malignancy.  CT CHEST, ABDOMEN AND PELVIS WITHOUT CONTRAST  Technique:  Multidetector CT imaging of the chest, abdomen and pelvis was performed following the standard protocol without IV contrast.  Comparison:  CT of the abdomen and pelvis 05/04/2009.  Chest x-ray 03/07/2012.  CT CHEST  Findings:  Large right pleural effusion and small to moderate left pleural effusion.  Moderate pericardial effusion.  Cardiomegaly. Pacer wires in place.  Diffuse coronary artery calcifications present.  Aorta is normal caliber with calcifications in the aortic arch and great vessels.  Compressive atelectasis in the lower lobes bilaterally, right greater than left.  Small scattered mediastinal lymph nodes, none pathologically enlarged. No mediastinal, hilar, or axillary adenopathy.  Visualized thyroid and chest wall soft tissues unremarkable.  No acute bony abnormality.  IMPRESSION: Large right pleural effusion.  Small moderate left pleural effusion.  Compressive  atelectasis bilaterally, right worse than left.  Cardiomegaly.  Moderate pericardial effusion.  CT ABDOMEN AND PELVIS  Findings:  Mildly nodular contours to the liver suggests cirrhosis. There is a small amount of perihepatic, perisplenic and cul-de-sac free fluid.  Urinary bladder is decompressed with Foley catheter in place.  Bilateral inguinal hernias containing fluid and fat.  Spleen is normal size.  No visible focal abnormality on this unenhanced study.  Pancreas, adrenals and kidneys have an unremarkable unenhanced appearance.  Small nonobstructing stones in the mid and lower pole of the right kidney.  Vascular calcifications throughout the aorta and branch vessels.  No aneurysm.  Large and small bowel as well as stomach grossly unremarkable. Mild edema throughout the mesentery and subcutaneous soft tissues of the abdomen and pelvis.  No acute bony abnormality.  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  IMPRESSION: Subtle nodularity to the liver contour suggests possibility of cirrhosis.  Recommend clinical correlation.  Small amount of ascites.  Anasarca like stranding within the mesentery and subcutaneous soft tissues.  Bilateral inguinal hernias containing fat and fluid.   Original Report Authenticated By: Charlett Nose, M.D.    Ct Chest Wo Contrast  03/09/2012  *RADIOLOGY REPORT*  Clinical Data: Shortness of breath.  CT CHEST WITHOUT CONTRAST  Technique:  Multidetector CT imaging of the chest was performed following the standard protocol without IV contrast.  Comparison: 03/08/2012.  Findings: Mediastinal lymph nodes are borderline enlarged, measuring up to 1.2 cm.  Only one of these contains a fatty hilum in the low right paratracheal station.  Hilar regions are difficult to definitively evaluate without IV contrast.  No axillary adenopathy.  Atherosclerotic calcification of the arterial vasculature, including coronary arteries.  Pulmonary arteries and heart are enlarged.  Moderate pericardial effusion.   Collapse/consolidation in the right middle and right lower lobes. Moderate to large right pleural effusion.  Small left pleural effusion with compressive atelectasis in the left lower lobe. There are a few scattered tiny nodular densities in the lungs bilaterally, measuring less than 4 mm in size.  Minimal subpleural ground-glass in the anterior left upper lobe.  Debris is seen in the airway, dependently.  Incidental imaging of the upper abdomen shows ascites.  Retrocrural lymph nodes measure up to 9 mm.  Gastrohepatic ligament lymph nodes are sub centimeter in size.  Liver margin appears somewhat irregular.  No worrisome lytic or sclerotic lesions.  IMPRESSION:  1.  Moderate to large right pleural effusion with associated collapse/consolidation in the right middle and right lower lobes. Right lower lobe pneumonia is strongly favored.  Follow-up to clearing is recommended. 2.  Small left pleural effusion with compressive atelectasis in the left lower lobe. 3.  Moderate pericardial effusion, stable. 4.  Mediastinal lymph nodes may be reactive. 5. 6.  Scattered tiny pulmonary nodular densities.  Continued attention on follow-up exams is warranted. 7.  Pulmonary arterial hypertension and extensive coronary artery calcification. 8.  Favor cirrhosis. 7.  Ascites.   Original Report Authenticated By: Leanna Battles, M.D.    Ct Chest Wo Contrast  03/08/2012  *RADIOLOGY REPORT*  Clinical Data:  Recent weight loss.  No effusions and ascites. Evaluate for malignancy.  CT CHEST, ABDOMEN AND PELVIS WITHOUT CONTRAST  Technique:  Multidetector CT imaging of the chest, abdomen and pelvis was performed following the standard protocol without IV contrast.  Comparison:  CT of the abdomen and pelvis 05/04/2009.  Chest x-ray 03/07/2012.  CT CHEST  Findings:  Large right pleural effusion and small to moderate left pleural effusion.  Moderate pericardial effusion.  Cardiomegaly. Pacer wires in place.  Diffuse coronary artery  calcifications present.  Aorta is normal caliber with calcifications in the aortic arch and great vessels.  Compressive atelectasis in the lower lobes bilaterally, right greater than left.  Small scattered mediastinal lymph nodes, none pathologically enlarged. No mediastinal, hilar, or axillary adenopathy.  Visualized thyroid and chest wall soft tissues unremarkable.  No acute bony abnormality.  IMPRESSION: Large right pleural effusion.  Small moderate left pleural effusion.  Compressive atelectasis bilaterally, right worse than left.  Cardiomegaly.  Moderate pericardial effusion.  CT ABDOMEN AND PELVIS  Findings:  Mildly nodular contours to the liver suggests cirrhosis. There is a small amount of perihepatic, perisplenic and cul-de-sac free fluid.  Urinary bladder is decompressed with Foley catheter in place.  Bilateral inguinal hernias containing fluid and fat.  Spleen is normal size.  No visible focal abnormality on this unenhanced study.  Pancreas, adrenals and kidneys have an unremarkable unenhanced appearance.  Small nonobstructing stones in the mid and lower pole of the right kidney.  Vascular calcifications throughout the aorta and branch vessels.  No aneurysm.  Large and small bowel as well as stomach grossly unremarkable. Mild edema throughout the mesentery and subcutaneous soft tissues of the abdomen and pelvis.  No acute bony abnormality.  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  IMPRESSION: Subtle nodularity to the liver contour suggests possibility of cirrhosis.  Recommend clinical correlation.  Small amount of ascites.  Anasarca like stranding within the mesentery and subcutaneous soft tissues.  Bilateral inguinal hernias containing fat and fluid.   Original Report Authenticated By: Charlett Nose, M.D.    Medications:      . budesonide-formoterol  2 puff Inhalation BID  . calcium acetate  667 mg Oral TID WC  . darbepoetin (ARANESP) injection - DIALYSIS  40 mcg Intravenous Q Wed-HD  .  doxercalciferol  1 mcg Intravenous Q M,W,F-HD  . feeding supplement  1 Container Oral Q24H  . ferric gluconate (FERRLECIT/NULECIT) IV  125 mg Intravenous Daily  . gabapentin  100 mg Oral TID  . hydrocerin   Topical BID  . insulin aspart  0-9 Units Subcutaneous TID WC  . insulin glargine  5 Units Subcutaneous Daily  . metoprolol succinate  25 mg Oral QHS  . pantoprazole  40 mg Oral Daily  . polyethylene glycol  17 g Oral Daily  . senna-docusate  1 tablet Oral BID  . simvastatin  40 mg Oral QHS  . sodium chloride  3 mL Intravenous Q12H  . Tamsulosin HCl  0.4 mg Oral QPC supper  . tiotropium  18 mcg Inhalation Daily    I  have reviewed scheduled and prn medications.  ASSESSMENT/RECOMMEDATIONS   66 yo with baseline CKD (1.7-1.9) from DM, HTN, chronic cardiorenal syndrome, who has diuretic refractory edema in the setting of CHF, cardiorenal exacerbation/severe RV dysfunction requiring initiation of dialysis/UF   1. Acute renal failure on chronic kidney disease stage III   High risk to end up ESRD due to cardiorenal issues  Temp cath placed 12/16   S/p HD X 3  Not much of a dent in volume due to hypotension  Worry that pericardial effusion may be playing a role here as he still Has a LOT of volume on board   With severity of cardiac issues dialysis may be permanent   Cards said OK to reduce metoprolol   Decreased to 25 QHS (12/18)   Dr. Donata Clay requests HD prior to OR 12/20 and heparin OK to use 2. Pericardial effusion   For subxiphoid pericardial window 12/20 3. Pleural effusion  s/p 1.8 liter tap 12/10   For chest tube 12/20  4. CKD-MBD   binder started (12/16)   PTH 262   Started Hectoral 1 mcg mwf with HD 5. Anemia   Started ESA (darbe 60 12/18)   Start iron load daily X 5 (since don't know definite HD schedule or if will be chronic yet)  6. Ischemic cardiomyopathy   systolic and diastolic heart failure;    EF 16% by echo 03/01/12; RV dysfunction/severe TR by echo as  well  7. CAD with prior stenting (2006)  8. H/O AFIB with prior AV nodal ablation  9. H/O pacemaker [name])  10. COPD ([name])  11. BPH with history of urinary retention  has foley in Patsi Sears)   Camille Bal, MD [facility] [phone] pager 03/10/2012, 9:38 AM

## 2012-03-11 ENCOUNTER — Inpatient Hospital Stay (HOSPITAL_COMMUNITY): Payer: Medicare Other

## 2012-03-11 ENCOUNTER — Encounter (HOSPITAL_COMMUNITY): Payer: Self-pay | Admitting: Anesthesiology

## 2012-03-11 ENCOUNTER — Encounter (HOSPITAL_COMMUNITY): Admission: EM | Disposition: E | Payer: Self-pay | Source: Home / Self Care | Attending: Pulmonary Disease

## 2012-03-11 ENCOUNTER — Inpatient Hospital Stay (HOSPITAL_COMMUNITY): Payer: Medicare Other | Admitting: Anesthesiology

## 2012-03-11 ENCOUNTER — Other Ambulatory Visit: Payer: Self-pay | Admitting: Internal Medicine

## 2012-03-11 DIAGNOSIS — I309 Acute pericarditis, unspecified: Secondary | ICD-10-CM

## 2012-03-11 HISTORY — PX: SUBXYPHOID PERICARDIAL WINDOW: SHX5075

## 2012-03-11 HISTORY — PX: CHEST TUBE INSERTION: SHX231

## 2012-03-11 LAB — POCT I-STAT 3, ART BLOOD GAS (G3+)
pCO2 arterial: 42.6 mmHg (ref 35.0–45.0)
pH, Arterial: 7.384 (ref 7.350–7.450)
pO2, Arterial: 186 mmHg — ABNORMAL HIGH (ref 80.0–100.0)

## 2012-03-11 LAB — URINALYSIS, ROUTINE W REFLEX MICROSCOPIC
Glucose, UA: NEGATIVE mg/dL
Ketones, ur: 15 mg/dL — AB
Nitrite: NEGATIVE
Protein, ur: >300 mg/dL — AB
Specific Gravity, Urine: 1.023 (ref 1.005–1.030)
Urobilinogen, UA: 1 mg/dL (ref 0.0–1.0)
pH: 5 (ref 5.0–8.0)

## 2012-03-11 LAB — BLOOD GAS, ARTERIAL
Acid-base deficit: 1 mmol/L (ref 0.0–2.0)
Bicarbonate: 28.2 mEq/L — ABNORMAL HIGH (ref 20.0–24.0)
Drawn by: 277331
O2 Content: 6 L/min
O2 Saturation: 88.5 %
Patient temperature: 98.6
TCO2: 31.3 mmol/L (ref 0–100)
pCO2 arterial: 99.5 mmHg (ref 35.0–45.0)
pH, Arterial: 7.08 — CL (ref 7.350–7.450)
pO2, Arterial: 71.7 mmHg — ABNORMAL LOW (ref 80.0–100.0)

## 2012-03-11 LAB — URINE MICROSCOPIC-ADD ON

## 2012-03-11 LAB — CBC
Hemoglobin: 10 g/dL — ABNORMAL LOW (ref 13.0–17.0)
Platelets: 195 10*3/uL (ref 150–400)
RBC: 4.23 MIL/uL (ref 4.22–5.81)
WBC: 8.7 10*3/uL (ref 4.0–10.5)

## 2012-03-11 LAB — RENAL FUNCTION PANEL
CO2: 28 mEq/L (ref 19–32)
Chloride: 92 mEq/L — ABNORMAL LOW (ref 96–112)
GFR calc Af Amer: 20 mL/min — ABNORMAL LOW (ref >90)
GFR calc non Af Amer: 17 mL/min — ABNORMAL LOW (ref >90)
Glucose, Bld: 135 mg/dL — ABNORMAL HIGH (ref 70–99)
Potassium: 4.7 mEq/L (ref 3.5–5.1)
Sodium: 131 mEq/L — ABNORMAL LOW (ref 135–145)

## 2012-03-11 LAB — GLUCOSE, CAPILLARY
Glucose-Capillary: 120 mg/dL — ABNORMAL HIGH (ref 70–99)
Glucose-Capillary: 107 mg/dL — ABNORMAL HIGH (ref 70–99)

## 2012-03-11 LAB — SURGICAL PCR SCREEN: MRSA, PCR: POSITIVE — AB

## 2012-03-11 SURGERY — CREATION, PERICARDIAL WINDOW, SUBXIPHOID APPROACH
Anesthesia: General | Laterality: Right | Wound class: Clean

## 2012-03-11 MED ORDER — MIDAZOLAM HCL 2 MG/2ML IJ SOLN: 1.0000 mg | INTRAMUSCULAR | Status: DC | PRN

## 2012-03-11 MED ORDER — LACTATED RINGERS IV SOLN: INTRAVENOUS | Status: DC

## 2012-03-11 MED ORDER — OXYCODONE HCL 5 MG PO TABS
5.0000 mg | ORAL_TABLET | ORAL | Status: AC | PRN
  Administered 2012-03-12: 5 mg via ORAL
  Filled 2012-03-11: qty 1

## 2012-03-11 MED ORDER — HYDROMORPHONE HCL PF 1 MG/ML IJ SOLN
INTRAMUSCULAR | Status: AC
  Filled 2012-03-11: qty 1

## 2012-03-11 MED ORDER — NALOXONE HCL 0.4 MG/ML IJ SOLN
0.4000 mg | INTRAMUSCULAR | Status: DC | PRN
  Administered 2012-03-11: 0.2 mg via INTRAVENOUS

## 2012-03-11 MED ORDER — GLYCOPYRROLATE 0.2 MG/ML IJ SOLN
INTRAMUSCULAR | Status: DC | PRN
  Administered 2012-03-11: .6 mg via INTRAVENOUS

## 2012-03-11 MED ORDER — SODIUM CHLORIDE 0.9 % IV SOLN
INTRAVENOUS | Status: DC | PRN
  Administered 2012-03-11: 15:00:00 via INTRAVENOUS

## 2012-03-11 MED ORDER — LIDOCAINE HCL (CARDIAC) 20 MG/ML IV SOLN
INTRAVENOUS | Status: DC | PRN
  Administered 2012-03-11: 60 mg via INTRAVENOUS

## 2012-03-11 MED ORDER — ONDANSETRON HCL 4 MG/2ML IJ SOLN
INTRAMUSCULAR | Status: DC | PRN
  Administered 2012-03-11: 4 mg via INTRAVENOUS

## 2012-03-11 MED ORDER — SENNOSIDES-DOCUSATE SODIUM 8.6-50 MG PO TABS
1.0000 | ORAL_TABLET | Freq: Every evening | ORAL | Status: DC | PRN
  Filled 2012-03-11: qty 1

## 2012-03-11 MED ORDER — PROPOFOL 10 MG/ML IV BOLUS
INTRAVENOUS | Status: DC | PRN
  Administered 2012-03-11: 100 mg via INTRAVENOUS

## 2012-03-11 MED ORDER — MUPIROCIN 2 % EX OINT
1.0000 "application " | TOPICAL_OINTMENT | Freq: Two times a day (BID) | CUTANEOUS | Status: AC
  Administered 2012-03-11 – 2012-03-15 (×9): 1 via NASAL
  Filled 2012-03-11 (×2): qty 22

## 2012-03-11 MED ORDER — 0.9 % SODIUM CHLORIDE (POUR BTL) OPTIME
TOPICAL | Status: DC | PRN
  Administered 2012-03-11: 2000 mL

## 2012-03-11 MED ORDER — HYDROMORPHONE HCL PF 1 MG/ML IJ SOLN
0.2500 mg | INTRAMUSCULAR | Status: DC | PRN
  Administered 2012-03-11: 0.5 mg via INTRAVENOUS

## 2012-03-11 MED ORDER — NEOSTIGMINE METHYLSULFATE 1 MG/ML IJ SOLN
INTRAMUSCULAR | Status: DC | PRN
  Administered 2012-03-11: 4 mg via INTRAVENOUS

## 2012-03-11 MED ORDER — POTASSIUM CHLORIDE 10 MEQ/50ML IV SOLN: 10.0000 meq | Freq: Every day | INTRAVENOUS | Status: DC | PRN

## 2012-03-11 MED ORDER — NALOXONE HCL 0.4 MG/ML IJ SOLN
INTRAMUSCULAR | Status: AC
  Filled 2012-03-11: qty 1

## 2012-03-11 MED ORDER — DEXTROSE 5 % IV SOLN
1.5000 g | Freq: Two times a day (BID) | INTRAVENOUS | Status: AC
  Administered 2012-03-11 – 2012-03-12 (×2): 1.5 g via INTRAVENOUS
  Filled 2012-03-11 (×2): qty 1.5

## 2012-03-11 MED ORDER — ONDANSETRON HCL 4 MG/2ML IJ SOLN: 4.0000 mg | Freq: Four times a day (QID) | INTRAMUSCULAR | Status: DC | PRN

## 2012-03-11 MED ORDER — FENTANYL CITRATE 0.05 MG/ML IJ SOLN: 50.0000 ug | INTRAMUSCULAR | Status: DC | PRN

## 2012-03-11 MED ORDER — OXYCODONE-ACETAMINOPHEN 5-325 MG PO TABS
1.0000 | ORAL_TABLET | ORAL | Status: DC | PRN
  Administered 2012-03-13: 1 via ORAL
  Administered 2012-03-13 – 2012-03-19 (×11): 2 via ORAL
  Filled 2012-03-11 (×10): qty 2
  Filled 2012-03-11: qty 1
  Filled 2012-03-11: qty 2

## 2012-03-11 MED ORDER — PROPOFOL 10 MG/ML IV EMUL
5.0000 ug/kg/min | INTRAVENOUS | Status: DC
  Administered 2012-03-11: 5 ug/kg/min via INTRAVENOUS
  Filled 2012-03-11: qty 100

## 2012-03-11 MED ORDER — METOPROLOL SUCCINATE ER 25 MG PO TB24
25.0000 mg | ORAL_TABLET | Freq: Every day | ORAL | Status: DC
  Administered 2012-03-12 – 2012-03-20 (×7): 25 mg via ORAL
  Filled 2012-03-11 (×13): qty 1

## 2012-03-11 MED ORDER — LIDOCAINE HCL (CARDIAC) 20 MG/ML IV SOLN: 100.0000 mg | Freq: Once | INTRAVENOUS | Status: DC

## 2012-03-11 MED ORDER — CHLORHEXIDINE GLUCONATE CLOTH 2 % EX PADS
6.0000 | MEDICATED_PAD | Freq: Every day | CUTANEOUS | Status: AC
  Administered 2012-03-12 – 2012-03-15 (×4): 6 via TOPICAL

## 2012-03-11 MED ORDER — TRAMADOL HCL 50 MG PO TABS: 50.0000 mg | ORAL_TABLET | Freq: Three times a day (TID) | ORAL | Status: DC | PRN

## 2012-03-11 MED ORDER — SUCCINYLCHOLINE CHLORIDE 20 MG/ML IJ SOLN
INTRAMUSCULAR | Status: DC | PRN
  Administered 2012-03-11: 80 mg via INTRAVENOUS

## 2012-03-11 MED ORDER — SODIUM CHLORIDE 0.9 % IV SOLN: INTRAVENOUS | Status: DC

## 2012-03-11 MED ORDER — FENTANYL CITRATE 0.05 MG/ML IJ SOLN
25.0000 ug | INTRAMUSCULAR | Status: DC | PRN
  Administered 2012-03-12: 50 ug via INTRAVENOUS
  Administered 2012-03-13: 25 ug via INTRAVENOUS
  Filled 2012-03-11 (×2): qty 2

## 2012-03-11 MED ORDER — ACETAMINOPHEN 10 MG/ML IV SOLN
1000.0000 mg | Freq: Four times a day (QID) | INTRAVENOUS | Status: AC
  Administered 2012-03-11 – 2012-03-12 (×3): 1000 mg via INTRAVENOUS
  Filled 2012-03-11 (×3): qty 100

## 2012-03-11 MED ORDER — EPHEDRINE SULFATE 50 MG/ML IJ SOLN
INTRAMUSCULAR | Status: DC | PRN
  Administered 2012-03-11: 10 mg via INTRAVENOUS
  Administered 2012-03-11: 5 mg via INTRAVENOUS
  Administered 2012-03-11: 15 mg via INTRAVENOUS

## 2012-03-11 MED ORDER — DOXERCALCIFEROL 4 MCG/2ML IV SOLN
INTRAVENOUS | Status: AC
  Administered 2012-03-11: 1 ug via INTRAVENOUS
  Filled 2012-03-11: qty 2

## 2012-03-11 MED ORDER — INSULIN ASPART 100 UNIT/ML ~~LOC~~ SOLN
0.0000 [IU] | SUBCUTANEOUS | Status: DC
  Administered 2012-03-12: 4 [IU] via SUBCUTANEOUS
  Administered 2012-03-12 – 2012-03-13 (×3): 2 [IU] via SUBCUTANEOUS
  Administered 2012-03-15: 4 [IU] via SUBCUTANEOUS
  Administered 2012-03-16 (×2): 2 [IU] via SUBCUTANEOUS

## 2012-03-11 MED ORDER — FENTANYL CITRATE 0.05 MG/ML IJ SOLN
INTRAMUSCULAR | Status: DC | PRN
  Administered 2012-03-11: 100 ug via INTRAVENOUS

## 2012-03-11 MED ORDER — PHENYLEPHRINE HCL 10 MG/ML IJ SOLN
INTRAMUSCULAR | Status: DC | PRN
  Administered 2012-03-11: 40 ug via INTRAVENOUS

## 2012-03-11 MED ORDER — ROCURONIUM BROMIDE 100 MG/10ML IV SOLN
INTRAVENOUS | Status: DC | PRN
  Administered 2012-03-11: 25 mg via INTRAVENOUS
  Administered 2012-03-11: 10 mg via INTRAVENOUS
  Administered 2012-03-11: 15 mg via INTRAVENOUS

## 2012-03-11 MED ORDER — ONDANSETRON HCL 4 MG/2ML IJ SOLN: 4.0000 mg | Freq: Once | INTRAMUSCULAR | Status: DC | PRN

## 2012-03-11 SURGICAL SUPPLY — 63 items
ADH SKN CLS APL DERMABOND .7 (GAUZE/BANDAGES/DRESSINGS) ×2
APL SKNCLS STERI-STRIP NONHPOA (GAUZE/BANDAGES/DRESSINGS)
ATTRACTOMAT 16X20 MAGNETIC DRP (DRAPES) ×4 IMPLANT
BENZOIN TINCTURE PRP APPL 2/3 (GAUZE/BANDAGES/DRESSINGS) ×2 IMPLANT
CANISTER SUCTION 2500CC (MISCELLANEOUS) ×4 IMPLANT
CATH THORACIC 28FR (CATHETERS) IMPLANT
CATH THORACIC 28FR RT ANG (CATHETERS) IMPLANT
CATH THORACIC 36FR (CATHETERS) IMPLANT
CATH THORACIC 36FR RT ANG (CATHETERS) IMPLANT
CATH TROCAR 28FR (CATHETERS) ×2 IMPLANT
CLOSURE WOUND 1/2 X4 (GAUZE/BANDAGES/DRESSINGS)
CLOTH BEACON ORANGE TIMEOUT ST (SAFETY) ×8 IMPLANT
CONN ST 1/4X3/8  BEN (MISCELLANEOUS) ×2
CONN ST 1/4X3/8 BEN (MISCELLANEOUS) IMPLANT
CONT SPEC STER OR (MISCELLANEOUS) ×6 IMPLANT
COVER SURGICAL LIGHT HANDLE (MISCELLANEOUS) ×8 IMPLANT
DERMABOND ADVANCED (GAUZE/BANDAGES/DRESSINGS) ×2
DERMABOND ADVANCED .7 DNX12 (GAUZE/BANDAGES/DRESSINGS) IMPLANT
DRAIN CHANNEL 28F RND 3/8 FF (WOUND CARE) ×4 IMPLANT
DRAPE LAPAROSCOPIC ABDOMINAL (DRAPES) ×4 IMPLANT
DRAPE PROXIMA HALF (DRAPES) ×4 IMPLANT
ELECT REM PT RETURN 9FT ADLT (ELECTROSURGICAL) ×4
ELECTRODE REM PT RTRN 9FT ADLT (ELECTROSURGICAL) ×2 IMPLANT
GLOVE BIO SURGEON STRL SZ 6.5 (GLOVE) ×1 IMPLANT
GLOVE BIO SURGEON STRL SZ7.5 (GLOVE) ×16 IMPLANT
GLOVE BIO SURGEONS STRL SZ 6.5 (GLOVE) ×1
GLOVE BIOGEL PI IND STRL 6.5 (GLOVE) IMPLANT
GLOVE BIOGEL PI IND STRL 7.0 (GLOVE) IMPLANT
GLOVE BIOGEL PI INDICATOR 6.5 (GLOVE) ×4
GLOVE BIOGEL PI INDICATOR 7.0 (GLOVE) ×2
GLOVE ORTHO TXT STRL SZ7.5 (GLOVE) ×2 IMPLANT
GOWN PREVENTION PLUS XLARGE (GOWN DISPOSABLE) ×4 IMPLANT
GOWN STRL NON-REIN LRG LVL3 (GOWN DISPOSABLE) ×6 IMPLANT
HEMOSTAT POWDER SURGIFOAM 1G (HEMOSTASIS) IMPLANT
KIT BASIN OR (CUSTOM PROCEDURE TRAY) ×4 IMPLANT
KIT ROOM TURNOVER OR (KITS) ×8 IMPLANT
KIT SUCTION CATH 14FR (SUCTIONS) ×2 IMPLANT
NS IRRIG 1000ML POUR BTL (IV SOLUTION) ×6 IMPLANT
PACK CHEST (CUSTOM PROCEDURE TRAY) ×4 IMPLANT
PAD ARMBOARD 7.5X6 YLW CONV (MISCELLANEOUS) ×16 IMPLANT
PAD ELECT DEFIB RADIOL ZOLL (MISCELLANEOUS) ×4 IMPLANT
SPONGE GAUZE 4X4 12PLY (GAUZE/BANDAGES/DRESSINGS) ×6 IMPLANT
STRIP CLOSURE SKIN 1/2X4 (GAUZE/BANDAGES/DRESSINGS) ×2 IMPLANT
SUT BONE WAX W31G (SUTURE) ×2 IMPLANT
SUT SILK  1 MH (SUTURE) ×8
SUT SILK 1 MH (SUTURE) ×4 IMPLANT
SUT SILK 2 0 SH CR/8 (SUTURE) ×4 IMPLANT
SUT VIC AB 1 CTX 18 (SUTURE) ×4 IMPLANT
SUT VIC AB 1 CTX 36 (SUTURE) ×4
SUT VIC AB 1 CTX36XBRD ANBCTR (SUTURE) ×2 IMPLANT
SUT VIC AB 3-0 X1 27 (SUTURE) ×4 IMPLANT
SWAB COLLECTION DEVICE MRSA (MISCELLANEOUS) IMPLANT
SYR 50ML SLIP (SYRINGE) IMPLANT
SYRINGE 10CC LL (SYRINGE) IMPLANT
SYSTEM SAHARA CHEST DRAIN RE-I (WOUND CARE) ×6 IMPLANT
TAPE CLOTH SURG 4X10 WHT LF (GAUZE/BANDAGES/DRESSINGS) ×4 IMPLANT
TOWEL OR 17X24 6PK STRL BLUE (TOWEL DISPOSABLE) ×8 IMPLANT
TOWEL OR 17X26 10 PK STRL BLUE (TOWEL DISPOSABLE) ×12 IMPLANT
TRAP SPECIMEN MUCOUS 40CC (MISCELLANEOUS) ×4 IMPLANT
TRAY FOLEY CATH 14FRSI W/METER (CATHETERS) ×2 IMPLANT
TRAY FOLEY IC TEMP SENS 14FR (CATHETERS) ×2 IMPLANT
TUBE ANAEROBIC SPECIMEN COL (MISCELLANEOUS) IMPLANT
WATER STERILE IRR 1000ML POUR (IV SOLUTION) ×8 IMPLANT

## 2012-03-11 NOTE — Progress Notes (Signed)
Oral airway placed.

## 2012-03-11 NOTE — Procedures (Signed)
Patient seen on dialysis. Pre weight 76.6 kg Marked peripheral edema unchanged and pt mildly dyspneic. Blood pressure already soft with SBP around 100 with around 500 cc UF off Temp right IJ cath (12/16) working fine. For pericardial window and chest thube this afternoon [name] [name] B

## 2012-03-11 NOTE — Progress Notes (Signed)
Dr Katrinka Blazing and Dr Ivin Booty at bedside.  Pt being bagged.

## 2012-03-11 NOTE — Addendum Note (Signed)
Addendum  created 03/18/2012 1849 by Kerby Nora, MD   Modules edited:Orders

## 2012-03-11 NOTE — Progress Notes (Signed)
Pt with o2 sats after admission to pacu at 78.  Pt bagged with crna and Dr Katrinka Blazing at bedside.  Received order to give narcan .

## 2012-03-11 NOTE — Transfer of Care (Signed)
Immediate Anesthesia Transfer of Care Note  Patient: [name]  Procedure(s) Performed: Procedure(s) (LRB) with comments: SUBXYPHOID PERICARDIAL WINDOW (N/A) - Would like to follow in OR 17 CHEST TUBE INSERTION (Right)  Patient Location: PACU  Anesthesia Type:General  Level of Consciousness: sedated and patient cooperative  Airway & Oxygen Therapy: Patient Spontanous Breathing, Patient connected to face mask oxygen and OPA.  Post-op Assessment: Report given to PACU RN and Post -op Vital signs reviewed and stable, MDA aware of OPA.   Post vital signs: Reviewed and stable  Complications: No apparent anesthesia complications

## 2012-03-11 NOTE — Progress Notes (Signed)
abg results called to Dr Alla German.

## 2012-03-11 NOTE — Anesthesia Procedure Notes (Signed)
Procedure Name: Intubation Date/Time: 03/20/2012 3:43 PM Performed by: Angelica Pou Pre-anesthesia Checklist: Patient identified, Timeout performed, Emergency Drugs available, Suction available and Patient being monitored Patient Re-evaluated:Patient Re-evaluated prior to inductionOxygen Delivery Method: Circle system utilized Preoxygenation: Pre-oxygenation with 100% oxygen Intubation Type: IV induction Ventilation: Oral airway inserted - appropriate to patient size and Mask ventilation without difficulty Laryngoscope Size: Mac and 3 Grade View: Grade I Tube type: Oral Tube size: 7.5 mm Number of attempts: 1 Airway Equipment and Method: Stylet and Oral airway Secured at: 22 cm Tube secured with: Tape Dental Injury: Teeth and Oropharynx as per pre-operative assessment

## 2012-03-11 NOTE — Progress Notes (Signed)
PT Cancellation Note  Patient Details Name: [name] MRN: [id number] DOB: May 10, 1945   Cancelled Treatment:    Reason Eval/Treat Not Completed: Medical issues which prohibited therapy;Patient at procedure or test/unavailable (Pt in HD in AM and now for pericardial window.).  Will DC PT. Please re-order post op when appropriate.   Keslie Gritz 03/15/2012, 1:56 PM [employer] PT [phone]

## 2012-03-11 NOTE — Progress Notes (Signed)
OT Cancellation Note  Patient Details Name: [name] MRN: [id number] DOB: 11-Jan-1946   Cancelled Treatment:    Reason Eval/Treat Not Completed: Patient at procedure or test/ unavailable;Patient not medically ready. Pt in HD in AM and now for pericardial window. Will sign off at this time. Please re-order when pt appropriate for OT.  03/12/2012 Cipriano Mile OTR/L Pager [phone] Office [phone]

## 2012-03-11 NOTE — Progress Notes (Signed)
[facility] PROGRESS NOTE  [name] JXB:[id number] DOB: 1945-08-14 DOA: Mar 11, 2012 PCP: [name], MD  Brief narrative:  66 year old presents with SOB and lower extremities edema. He was diagnosed with Acute on chronic HF exacerbation. He was found to have pleural effusion and has moderate pericardial effusion with no physiologic tamponade. He develop acute on chronic renal failure. He now will require dialysis. Nephrology following patient .   Assessment/Plan:  Acute on chronic combined systolic and diastolic CHF (congestive heart failure) / Small right pleural effusion  -EF 50 to 55 % from echo on 12/10. Repeated on 12/18 and shows mild to moderately reduced EF of 40-45 %  -off lasix now.  -chest x-ray 02/29/2012 showed an enlarging effusion. thoracentesis with 1.8 L of fluid removed on 12/10. culture shows no growth. added cytology evaluation of pleural fluid. A repeat chest CT on 12/17 still shows moderate to large size rt sided pleural effusion.  PERICARDIOCENTESIS done today with drain placed in. A right thoracentesis done with chest tube placed as well. Follow [facility] surgery note. Appreciate cardiology recommendations.  -patient on NRB post op. Vitals stable. Planned to be transferred to ICU for close monitoring.  Pericardial effusion  -No obvious tamponade physiology. Hold Coumadin for pericardiocentesis or pericardial window.  -Seen by Dr Morton Peters. Repeat echo shows some decrease in EF but no change in the size of effusion and no tamponade. FT done . pericardiocentesis on 12/20 with drain placed.  Acute on chronic CKD  -secondary to Heart failure exacerbation, hemodynamically mediated injury and evolution of ATN. Cr baseline 1.6 to 1.9 prior records.  Temporary HD cath placed 03/07/12 . Getting HD here   -Urinary retention  Patient had 708 cc in bladder on bladder scan 12/10. Foley catheter placed. No hydronephrosis on renal ultrasound.  Continue with  Flomax.  Can d/c home with foley, outpatient urology followup. Will need to make sure patient able to afford medications prior to discharge.  Neosporin cream to catheter area for irritation.   Microcytic anemia  consistent with Anemia of chronic disease  Hb stable   Hypokalemia / hyperkalemia  Resolved   Hyponatremia  adjusting with HD   Skin wounds  Seen by wound care nurse 02/28/2012. Benadryl cream when necessary for pruritus. Eucerin cream for maintenance of moisturization.   DM Type II / hypoglycemia  Lantus discontinued on 02/28/2012 secondary to hypoglycemia.  Continue low dose insulin once taking PO  A fib  Status post AV nodal ablation. Has pacemaker. Rate controlled, Coumadin on hold For planned pericardiocentesis for 12/20. Cardio recommend holding anticoagulation for at least 1 week.   COPD  Stable. Continue Symbicort and Spiriva.    -E. Coli UTI (lower urinary tract infection)  Patient completed antibiotic regimen with rocephin   Code Status: Full.  Family Communication: no family at bedside.   Disposition Plan: PT eval post op. Likely needs SNF  Consultants:  Cardiology, Dr. Eden Emms.  renal  CTS ( Dr Morton Peters)   Procedures:  Thoracentesis 12/10.  Pericardiocentesis and rt thoracentessis  on 12/20  Antibiotics:  Rocephin 02/26/2012--->02/29/12  Cipro 02/29/2012--->    HPI/Subjective: Patient finally seen and examined after returning to PACU. He is post op and lethargic on a NRB  Objective: Filed Vitals:   02/26/2012 1748 02/28/2012 1800 02/21/2012 1804 02/23/2012 1809  BP: 108/59  97/79   Pulse:  84 81 83  Temp:      TempSrc:      Resp:  19 19 21  Height:      Weight:      SpO2:    96%    Intake/Output Summary (Last 24 hours) at 03/01/2012 1810 Last data filed at 03/18/2012 1645  Gross per 24 hour  Intake    720 ml  Output   4350 ml  Net  -3630 ml   Filed Weights   03/10/12 2157 02/22/2012 0717 03/02/2012 1105  Weight: 76.7 kg (169 lb 1.5 oz) 76.6 kg  (168 lb 14 oz) 73.9 kg (162 lb 14.7 oz)    Exam:   General:  Elderly male seen after OR. Lethargic on NRB  Cardiovascular: S1&S2, regular, no murmurs  Respiratory: improved areation over rt lung, rt chest tube and pericardiocentesis drain in pace  Abdomen: soft, NT, ND BS+  CNS: lethargic post anesthesia   Data Reviewed: Basic Metabolic Panel:  Lab 03/10/2012 4782 03/10/12 1805 03/10/12 0610 03/09/12 1412 03/08/12 0725 03/06/12 1002 03/05/12 0524  NA 131* 128* 130* 127* 130* -- --  K 4.7 4.5 4.3 4.5 4.5 -- --  CL 92* 91* 92* 88* 88* -- --  CO2 28 23 29 28 30  -- --  GLUCOSE 135* 156* 116* 127* 142* -- --  BUN 48* 41* 36* 63* 88* -- --  CREATININE 3.44* 2.87* 2.37* 2.84* 2.87* -- --  CALCIUM 8.6 8.6 8.4 8.6 8.4 -- --  MG -- -- -- -- -- -- 2.1  PHOS 4.7* -- 3.6 -- 6.3* 7.0* 6.9*   Liver Function Tests:  Lab 03/10/2012 0800 03/10/12 1805 03/10/12 0610 03/09/12 1412 03/08/12 0725 03/07/12 1718  AST -- 21 -- 24 -- --  ALT -- 13 -- 13 -- 11  ALKPHOS -- 81 -- 84 -- --  BILITOT -- 0.5 -- 0.6 -- --  PROT -- 7.6 -- 7.9 -- --  ALBUMIN 2.7* 2.7* 2.7* 2.9* 2.9* --   No results found for this basename: LIPASE:5,AMYLASE:5 in the last 168 hours No results found for this basename: AMMONIA:5 in the last 168 hours CBC:  Lab 03/16/2012 0800 03/10/12 1805 03/10/12 0610 03/09/12 1412 03/08/12 0725  WBC 8.7 6.9 7.8 8.9 7.7  NEUTROABS -- -- -- 7.2 --  HGB 10.0* 10.0* 9.6* 10.4* 10.0*  HCT 34.4* 33.6* 32.5* 35.0* 32.4*  MCV 81.3 80.4 79.5 80.1 77.7*  PLT 195 171 191 201 195   Cardiac Enzymes: No results found for this basename: CKTOTAL:5,CKMB:5,CKMBINDEX:5,TROPONINI:5 in the last 168 hours BNP (last 3 results)  Basename 02/29/12 0505 02/26/12 0105 04/07/11 0934  PROBNP 8263.0* 8685.0* 205.0*   CBG:  Lab 02/26/2012 1139 03/10/12 2202 03/10/12 1635 03/10/12 1139 03/10/12 0800  GLUCAP 120* 136* 149* 142* 126*    Recent Results (from the past 240 hour(s))  URINE CULTURE     Status:  Normal   Collection Time   03/02/12  3:14 PM      Component Value Range Status Comment   Specimen Description URINE, CLEAN CATCH   Final    Special Requests NONE   Final    Culture  Setup Time 03/03/2012 01:27   Final    Colony Count NO GROWTH   Final    Culture NO GROWTH   Final    Report Status 03/04/2012 FINAL   Final   URINE CULTURE     Status: Normal   Collection Time   03/09/12  6:14 AM      Component Value Range Status Comment   Specimen Description URINE, CLEAN CATCH   Final    Special Requests NONE  Final    Culture  Setup Time 03/09/2012 15:19   Final    Colony Count NO GROWTH   Final    Culture NO GROWTH   Final    Report Status 03/10/2012 FINAL   Final   SURGICAL PCR SCREEN     Status: Abnormal   Collection Time   03/09/2012  5:16 AM      Component Value Range Status Comment   MRSA, PCR POSITIVE (*) NEGATIVE Final    Staphylococcus aureus POSITIVE (*) NEGATIVE Final      Studies: Dg Chest 2 View  03/10/2012  *RADIOLOGY REPORT*  Clinical Data: Pericardial window procedure in the morning  CHEST - 2 VIEW  Comparison: Chest CT without contrast 03/09/2012  Findings: Marked enlargement cardiopericardial silhouette. Moderate pulmonary vascular congestion.  Moderate right pleural effusion.  Small left pleural effusion.  Left chest wall pacer with leads in the coronary sinus and right ventricle. Right IJ  catheter terminates near the cavoatrial junction.  IMPRESSION:  1.  Marked enlargement of the cardiopericardial silhouette, stable. The patient has a known pericardial effusion as described on chest CT performed yesterday. 2.  Moderate right and small left pleural effusions, stable.   Original Report Authenticated By: Britta Mccreedy, M.D.    Dg Chest Portable 1 View  03/18/2012  *RADIOLOGY REPORT*  Clinical Data: Confirm placement of right chest tube.  PORTABLE CHEST - 1 VIEW  Comparison: Chest 03/10/2012.  Findings: The lung apices are off the margin of the film.  Right chest  tube is in place.  Right pleural effusion is markedly decreased.  There may be a small basilar pneumothorax. Cardiomegaly is again seen.  Atelectasis in the right lung base is noted.  IMPRESSION:  1.  Right chest tube in place with marked decrease in right pleural effusion.  There may be a small right basilar pneumothorax. 2.  Cardiomegaly.   Original Report Authenticated By: Holley Dexter, M.D.     Scheduled Meds:   . acetaminophen  1,000 mg Intravenous Q6H  . [MAR HOLD] budesonide-formoterol  2 puff Inhalation BID  . [facility] HOLD] calcium acetate  667 mg Oral TID WC  . [MAR HOLD] Chlorhexidine Gluconate Cloth  6 each Topical Q0600  . [MAR HOLD] darbepoetin (ARANESP) injection - DIALYSIS  40 mcg Intravenous Q Wed-HD  . [facility] HOLD] doxercalciferol  1 mcg Intravenous Q M,W,F-HD  . [MAR HOLD] feeding supplement  1 Container Oral Q24H  . [facility] HOLD] ferric gluconate (FERRLECIT/NULECIT) IV  125 mg Intravenous Daily  . [facility] HOLD] gabapentin  100 mg Oral TID  . [MAR HOLD] hydrocerin   Topical BID  . [MAR HOLD] insulin aspart  0-9 Units Subcutaneous TID WC  . [MAR HOLD] insulin glargine  5 Units Subcutaneous Daily  . [facility] HOLD] metoprolol succinate  25 mg Oral QHS  . [facility] HOLD] mupirocin ointment  1 application Nasal BID  . naloxone      . [MAR HOLD] pantoprazole  40 mg Oral Daily  . [MAR HOLD] polyethylene glycol  17 g Oral Daily  . [MAR HOLD] senna-docusate  1 tablet Oral BID  . [facility] HOLD] simvastatin  40 mg Oral QHS  . [MAR HOLD] sodium chloride  3 mL Intravenous Q12H  . [facility] HOLD] Tamsulosin HCl  0.4 mg Oral QPC supper  . [MAR HOLD] tiotropium  18 mcg Inhalation Daily   Continuous Infusions:   . sodium chloride    . lactated ringers        Time spent:  30 MINUTES    [name]  [facility] Pager [phone] If 8PM-8AM, please contact night-coverage at [email or web address], password [facility] 03/02/2012, 6:10 PM  LOS: 15 days

## 2012-03-11 NOTE — Anesthesia Postprocedure Evaluation (Signed)
  Anesthesia Post-op Note  Patient: [name]  Procedure(s) Performed: Procedure(s) (LRB) with comments: SUBXYPHOID PERICARDIAL WINDOW (N/A) - Would like to follow in OR 17 CHEST TUBE INSERTION (Right)  Patient Location: PACU  Anesthesia Type:General  Level of Consciousness: confused, lethargic and obtunded  Airway and Oxygen Therapy: Patient Spontanous Breathing and non-rebreather face mask  Post-op Pain: none  Post-op Assessment: Post-op Vital signs reviewed  Post-op Vital Signs: Reviewed  Complications: Pt required bagging with 100% O2 for CO2 of 99 after poor breathing effort.  Will intubate if not improved.

## 2012-03-11 NOTE — Anesthesia Preprocedure Evaluation (Signed)
Anesthesia Evaluation  Patient identified by MRN, date of birth, ID band Patient awake    Reviewed: Allergy & Precautions, H&P , NPO status , Patient's Chart, lab work & pertinent test results  Airway Mallampati: I TM Distance: >3 FB Neck ROM: full    Dental   Pulmonary COPDCurrent Smoker,          Cardiovascular hypertension, + CAD, + Peripheral Vascular Disease and +CHF + dysrhythmias + pacemaker Rhythm:regular Rate:Normal     Neuro/Psych    GI/Hepatic   Endo/Other  diabetes, Type 2, Insulin Dependent  Renal/GU CRFRenal disease     Musculoskeletal   Abdominal   Peds  Hematology  (+) anemia ,   Anesthesia Other Findings   Reproductive/Obstetrics                           Anesthesia Physical Anesthesia Plan  ASA: IV  Anesthesia Plan: General   Post-op Pain Management:    Induction: Intravenous  Airway Management Planned: Oral ETT  Additional Equipment: Arterial line  Intra-op Plan:   Post-operative Plan: Possible Post-op intubation/ventilation  Informed Consent: I have reviewed the patients History and Physical, chart, labs and discussed the procedure including the risks, benefits and alternatives for the proposed anesthesia with the patient or authorized representative who has indicated his/her understanding and acceptance.     Plan Discussed with: CRNA, Anesthesiologist and Surgeon  Anesthesia Plan Comments:         Anesthesia Quick Evaluation

## 2012-03-11 NOTE — Progress Notes (Signed)
TCTS  The patient was examined and preop studies reviewed. There has been no change from the prior exam and the patient is ready for surgery.  Plan pericardial window and R pleural chest tube today on [place] Expect patient will be vent dependent for short term postop

## 2012-03-11 NOTE — Brief Op Note (Signed)
03/17/2012 - March 13, 2012  4:50 PM  PATIENT:  [name]  66 y.o. male  PRE-OPERATIVE DIAGNOSIS:  1. pericardial effusion 2.Right pleural effusion 3.Chronic renal insufficiency  POST-OPERATIVE DIAGNOSIS:  1. pericardial effusion 2.Right pleural effusion 3.Chronic renal insufficiency  PROCEDURE: SUBXYPHOID PERICARDIAL WINDOW, RIGHT CHEST TUBE INSERTION  SURGEON:  Surgeon(s) and Role:    * Kerin Perna, MD - Primary  PHYSICIAN ASSISTANT: Doree Fudge PA-C   ANESTHESIA:   general  EBL:  Total I/O In: 600 [I.V.:600] Out: 4350 [Urine:50; Other:4300]  BLOOD ADMINISTERED:none  DRAINS: One 63 French Chest Tube(s) in the right pleural space and (One 28) Blake drain(s) in the Pericardial space   LOCAL MEDICATIONS USED:  NONE  FINDINGS: 900 cc of light yellowish fluid removed from the pericardium; 1500 cc of light yellowish fluid removed from the right pleural space  SPECIMEN:  Source of Specimen:  Pericardial tissue. Cultures and cytology of pericardial and right pleural fluid also sent.  DISPOSITION OF SPECIMEN:  PATHOLOGY  COUNTS CORRECT:  YES  DICTATION: .Dragon Dictation  PLAN OF CARE: Admit to inpatient   PATIENT DISPOSITION:  PACU - hemodynamically stable.   Delay start of Pharmacological VTE agent (>24hrs) due to surgical blood loss or risk of bleeding: yes

## 2012-03-11 NOTE — OR Nursing (Signed)
Volunteer desk called at 1612 to notify family of surgery start.

## 2012-03-11 NOTE — Progress Notes (Signed)
Patient in HD and then sent to OR for Pericardiocentesis and rt thoracentesis. Anticipate need for vent post op and possible ICU for short term thereaafter. Unable to see and examine patient on multiple visits.

## 2012-03-11 NOTE — Preoperative (Signed)
Beta Blockers   Reason not to administer Beta Blockers:metoprolol taken 12/19 pm per [facility]

## 2012-03-12 ENCOUNTER — Inpatient Hospital Stay (HOSPITAL_COMMUNITY): Payer: Medicare Other

## 2012-03-12 LAB — GLUCOSE, CAPILLARY
Glucose-Capillary: 99 mg/dL (ref 70–99)
Glucose-Capillary: 103 mg/dL — ABNORMAL HIGH (ref 70–99)

## 2012-03-12 LAB — BLOOD GAS, ARTERIAL
Acid-Base Excess: 1.3 mmol/L (ref 0.0–2.0)
Bicarbonate: 24.8 mEq/L — ABNORMAL HIGH (ref 20.0–24.0)
Drawn by: 318431
FIO2: 0.4 %
MECHVT: 600 mL
O2 Saturation: 99.9 %
PEEP: 5 cmH2O
Patient temperature: 98.6
RATE: 15 resp/min
TCO2: 25.9 mmol/L (ref 0–100)
pCO2 arterial: 36 mmHg (ref 35.0–45.0)
pH, Arterial: 7.453 — ABNORMAL HIGH (ref 7.350–7.450)
pO2, Arterial: 161 mmHg — ABNORMAL HIGH (ref 80.0–100.0)

## 2012-03-12 LAB — CBC
Hemoglobin: 9.8 g/dL — ABNORMAL LOW (ref 13.0–17.0)
Platelets: 164 10*3/uL (ref 150–400)
RBC: 4.02 MIL/uL — ABNORMAL LOW (ref 4.22–5.81)
WBC: 8 10*3/uL (ref 4.0–10.5)

## 2012-03-12 LAB — BASIC METABOLIC PANEL
CO2: 24 mEq/L (ref 19–32)
Chloride: 97 mEq/L (ref 96–112)
Glucose, Bld: 103 mg/dL — ABNORMAL HIGH (ref 70–99)
Sodium: 130 mEq/L — ABNORMAL LOW (ref 135–145)

## 2012-03-12 LAB — POCT I-STAT 3, ART BLOOD GAS (G3+)
Acid-Base Excess: 3 mmol/L — ABNORMAL HIGH (ref 0.0–2.0)
O2 Saturation: 100 %
Patient temperature: 97
pO2, Arterial: 183 mmHg — ABNORMAL HIGH (ref 80.0–100.0)

## 2012-03-12 LAB — URINE CULTURE

## 2012-03-12 MED ORDER — INSULIN GLARGINE 100 UNIT/ML ~~LOC~~ SOLN
30.0000 [IU] | Freq: Every day | SUBCUTANEOUS | Status: DC
  Administered 2012-03-12: 30 [IU] via SUBCUTANEOUS

## 2012-03-12 MED ORDER — LIDOCAINE HCL 2 % EX GEL
Freq: Once | CUTANEOUS | Status: DC
  Filled 2012-03-12: qty 5

## 2012-03-12 MED ORDER — BUDESONIDE-FORMOTEROL FUMARATE 160-4.5 MCG/ACT IN AERO: 2.0000 | INHALATION_SPRAY | Freq: Two times a day (BID) | RESPIRATORY_TRACT | Status: DC

## 2012-03-12 MED ORDER — TRAMADOL HCL 50 MG PO TABS
50.0000 mg | ORAL_TABLET | Freq: Four times a day (QID) | ORAL | Status: DC | PRN
  Administered 2012-03-12 – 2012-03-18 (×3): 50 mg via ORAL
  Filled 2012-03-12 (×2): qty 1
  Filled 2012-03-12: qty 2
  Filled 2012-03-12: qty 1

## 2012-03-12 MED ORDER — LIDOCAINE HCL 2 % EX GEL
Freq: Once | CUTANEOUS | Status: AC
  Administered 2012-03-12: 20 via URETHRAL
  Filled 2012-03-12: qty 5

## 2012-03-12 NOTE — Progress Notes (Addendum)
[facility] PROGRESS NOTE  [name] JXB:[id number] DOB: 03-29-1945 DOA: 03/17/2012 PCP: [name], MD  Brief narrative:  66 year old presents with SOB and lower extremities edema. He was diagnosed with Acute on chronic HF exacerbation. He was found to have pleural effusion and has moderate pericardial effusion with no physiologic tamponade. He develop acute on chronic renal failure. He now will require dialysis. Nephrology following patient .   Assessment/Plan:  Acute on chronic combined systolic and diastolic CHF (congestive heart failure) / Small right pleural effusion  -EF 50 to 55 % from echo on 12/10. Repeated on 12/18 and shows mild to moderately reduced EF of 40-45 %  -off lasix now.  -chest x-ray 02/29/2012 showed an enlarging effusion. thoracentesis with 1.8 L of fluid removed on 12/10. culture shows no growth. added cytology evaluation of pleural fluid. A repeat chest CT on 12/17 still shows moderate to large size rt sided pleural effusion.  -S/p PERICARDIOCENTESIS done 12/20 with drain placed in.  -Also s/p right thoracentesis done with chest tube placed as well on 12/20. Follow [facility] surgery note. -cards was following  -pt was on vent overnight, extubated this am, per Dr Zenaida Niece Tright's  Pericardial effusion  -No obvious tamponade physiology. Hold Coumadin for pericardiocentesis or pericardial window.  -Seen by Dr Morton Peters. Repeat echo shows some decrease in EF but no change in the size of effusion and no tamponade. FT done . pericardiocentesis on 12/20 with drain placed as above  Acute on chronic CKD  -secondary to Heart failure exacerbation, hemodynamically mediated injury and evolution of ATN. Cr baseline 1.6 to 1.9 prior records.  Temporary HD cath placed 03/07/12 . Getting HD per renal   -Urinary retention  Patient had 708 cc in bladder on bladder scan 12/10. Foley catheter placed. No hydronephrosis on renal ultrasound.  Continue with Flomax.  Can  d/c home with foley, outpatient urology followup. Will need to make sure patient able to afford medications prior to discharge.  Neosporin cream to catheter area for irritation.   Microcytic anemia  consistent with Anemia of chronic disease  Hb stable   Hypokalemia / hyperkalemia  Resolved   Hyponatremia  adjusting with HD   Skin wounds  Seen by wound care nurse 02/28/2012. Benadryl cream when necessary for pruritus. Eucerin cream for maintenance of moisturization.   DM Type II / hypoglycemia  Lantus discontinued on 02/28/2012 secondary to hypoglycemia.  Continue SSI, no further hypoglycemic episodes  A fib  Status post AV nodal ablation. Has pacemaker. Rate controlled, Coumadin on hold For planned pericardiocentesis for 12/20. Cardio recommend holding anticoagulation for at least 1 week.  -coumadin still on hold as recommended COPD  Stable. Continue Symbicort and Spiriva.    E. Coli UTI (lower urinary tract infection)  Patient completed antibiotic regimen with rocephin   Code Status: Full.  Family Communication: no family at bedside.   Disposition Plan:pending PT eval.  Consultants:  Cardiology, Dr. Eden Emms.  renal  CTS ( Dr Morton Peters)  Procedures:  Thoracentesis 12/10.  Pericardiocentesis and rt thoracentessis  on 12/20  Antibiotics:  Rocephin 02/26/2012--->02/29/12  cipro 12/9          zinacef 12/19->12/20  HPI/Subjective: Patient s/p extubation this am, denies SOB- states he feels better today  Objective: Filed Vitals:   03/12/12 1100 03/12/12 1110 03/12/12 1200 03/12/12 1244  BP: 90/44  104/52   Pulse: 86  84   Temp:    97.2 F (36.2 C)  TempSrc:  Oral  Resp: 15  16   Height:      Weight:      SpO2: 100% 98% 97%     Intake/Output Summary (Last 24 hours) at 03/12/12 1457 Last data filed at 03/12/12 1200  Gross per 24 hour  Intake 1742.06 ml  Output   3045 ml  Net -1302.94 ml   Filed Weights   March 18, 2012 0717 03/19/2012 1105 03/12/12 0500  Weight:  76.6 kg (168 lb 14 oz) 73.9 kg (162 lb 14.7 oz) 77 kg (169 lb 12.1 oz)    Exam:   General:  Alert&oriented x3, in NAD  Cardiovascular: S1&S2, regular, no murmurs  Respiratory: improved areation over rt lung, rt chest tube and pericardiocentesis drain in pace  Abdomen: soft, NT, ND BS+  Extremities:no edema   Data Reviewed: Basic Metabolic Panel:  Lab 03/12/12 1610 March 18, 2012 0800 03/10/12 1805 03/10/12 0610 03/09/12 1412 03/08/12 0725 03/06/12 1002  NA 130* 131* 128* 130* 127* -- --  K 4.0 4.7 4.5 4.3 4.5 -- --  CL 97 92* 91* 92* 88* -- --  CO2 24 28 23 29 28  -- --  GLUCOSE 103* 135* 156* 116* 127* -- --  BUN 27* 48* 41* 36* 63* -- --  CREATININE 2.33* 3.44* 2.87* 2.37* 2.84* -- --  CALCIUM 8.2* 8.6 8.6 8.4 8.6 -- --  MG -- -- -- -- -- -- --  PHOS -- 4.7* -- 3.6 -- 6.3* 7.0*   Liver Function Tests:  Lab 03/07/2012 0800 03/10/12 1805 03/10/12 0610 03/09/12 1412 03/08/12 0725 03/07/12 1718  AST -- 21 -- 24 -- --  ALT -- 13 -- 13 -- 11  ALKPHOS -- 81 -- 84 -- --  BILITOT -- 0.5 -- 0.6 -- --  PROT -- 7.6 -- 7.9 -- --  ALBUMIN 2.7* 2.7* 2.7* 2.9* 2.9* --   No results found for this basename: LIPASE:5,AMYLASE:5 in the last 168 hours No results found for this basename: AMMONIA:5 in the last 168 hours CBC:  Lab 03/12/12 0408 02/25/2012 0800 03/10/12 1805 03/10/12 0610 03/09/12 1412  WBC 8.0 8.7 6.9 7.8 8.9  NEUTROABS -- -- -- -- 7.2  HGB 9.8* 10.0* 10.0* 9.6* 10.4*  HCT 31.7* 34.4* 33.6* 32.5* 35.0*  MCV 78.9 81.3 80.4 79.5 80.1  PLT 164 195 171 191 201   Cardiac Enzymes: No results found for this basename: CKTOTAL:5,CKMB:5,CKMBINDEX:5,TROPONINI:5 in the last 168 hours BNP (last 3 results)  Basename 02/29/12 0505 02/26/12 0105 04/07/11 0934  PROBNP 8263.0* 8685.0* 205.0*   CBG:  Lab 03/12/12 1241 03/12/12 0736 03/12/12 0324 02/22/2012 2323 02/29/2012 1947  GLUCAP 152* 84 103* 99 107*    Recent Results (from the past 240 hour(s))  URINE CULTURE     Status: Normal    Collection Time   03/02/12  3:14 PM      Component Value Range Status Comment   Specimen Description URINE, CLEAN CATCH   Final    Special Requests NONE   Final    Culture  Setup Time 03/03/2012 01:27   Final    Colony Count NO GROWTH   Final    Culture NO GROWTH   Final    Report Status 03/04/2012 FINAL   Final   URINE CULTURE     Status: Normal   Collection Time   03/09/12  6:14 AM      Component Value Range Status Comment   Specimen Description URINE, CLEAN CATCH   Final    Special Requests NONE  Final    Culture  Setup Time 03/09/2012 15:19   Final    Colony Count NO GROWTH   Final    Culture NO GROWTH   Final    Report Status 03/10/2012 FINAL   Final   URINE CULTURE     Status: Normal   Collection Time   03/07/2012  1:56 AM      Component Value Range Status Comment   Specimen Description URINE, CATHETERIZED   Final    Special Requests CX ADDED AT 0242 ON [id number]   Final    Culture  Setup Time 03/18/2012 02:49   Final    Colony Count NO GROWTH   Final    Culture NO GROWTH   Final    Report Status 03/12/2012 FINAL   Final   SURGICAL PCR SCREEN     Status: Abnormal   Collection Time   03/06/2012  5:16 AM      Component Value Range Status Comment   MRSA, PCR POSITIVE (*) NEGATIVE Final    Staphylococcus aureus POSITIVE (*) NEGATIVE Final   TISSUE CULTURE     Status: Normal (Preliminary result)   Collection Time   02/24/2012  4:43 PM      Component Value Range Status Comment   Specimen Description TISSUE PERICARDIUM   Final    Special Requests NONE   Final    Gram Stain     Final    Value: NO WBC SEEN     NO ORGANISMS SEEN   Culture PENDING   Incomplete    Report Status PENDING   Incomplete   BODY FLUID CULTURE     Status: Normal (Preliminary result)   Collection Time   03/03/2012  4:50 PM      Component Value Range Status Comment   Specimen Description FLUID RIGHT PLEURAL   Final    Special Requests NONE   Final    Gram Stain     Final    Value: RARE WBC PRESENT,  PREDOMINANTLY MONONUCLEAR     NO ORGANISMS SEEN   Culture PENDING   Incomplete    Report Status PENDING   Incomplete      Studies: Dg Chest 2 View  03/10/2012  *RADIOLOGY REPORT*  Clinical Data: Pericardial window procedure in the morning  CHEST - 2 VIEW  Comparison: Chest CT without contrast 03/09/2012  Findings: Marked enlargement cardiopericardial silhouette. Moderate pulmonary vascular congestion.  Moderate right pleural effusion.  Small left pleural effusion.  Left chest wall pacer with leads in the coronary sinus and right ventricle. Right IJ  catheter terminates near the cavoatrial junction.  IMPRESSION:  1.  Marked enlargement of the cardiopericardial silhouette, stable. The patient has a known pericardial effusion as described on chest CT performed yesterday. 2.  Moderate right and small left pleural effusions, stable.   Original Report Authenticated By: Britta Mccreedy, M.D.    Dg Chest Port 1 View  03/12/2012  *RADIOLOGY REPORT*  Clinical Data: Postop pericardial window  PORTABLE CHEST - 1 VIEW  Comparison: Portable chest x-ray of 03/10/2012  Findings: The tip of the endotracheal tube is approximally 4.5 cm above the carina.  The right chest tube is present and no definite right pneumothorax is seen.  There is mild right basilar atelectasis and probable small right effusion present. Cardiomegaly is stable and there does appear to be pulmonary vascular congestion present.  A permanent pacemaker remains.  Right central venous line tips overlie the expected SVC - RA junction.  IMPRESSION:  1.  Right chest tube.  No pneumothorax. 2.  Cardiomegaly and probable pulmonary vascular congestion. 3.  Endotracheal tube tip 4.5 cm above the carina.   Original Report Authenticated By: Dwyane Dee, M.D.    Dg Chest Portable 1 View  03/15/2012  *RADIOLOGY REPORT*  Clinical Data: Status post chest tube and ET tube placement.  PORTABLE CHEST - 1 VIEW  Comparison: Plain film chest 03/19/2012 at 3:17 p.m.  12/03/1911. CT chest 03/09/2012.  Findings: Endotracheal tube is new since yesterday's examination. The tip is in good position, just below the clavicular heads and well above the carina.  Right chest tube is identified.  Right pleural effusion is markedly decreased.  Increased lucency in the right lung base may be due to an anterior pneumothorax.  The left lung is expanded and clear.  There is cardiomegaly.  IMPRESSION:  1.  Right chest tube in place with marked decrease in a right pleural effusion.  Possible small anterior pneumothorax noted. 2.  ET tube in good position. 3.  Marked cardiomegaly.   Original Report Authenticated By: Holley Dexter, M.D.    Dg Chest Portable 1 View  03/14/2012  *RADIOLOGY REPORT*  Clinical Data: Confirm placement of right chest tube.  PORTABLE CHEST - 1 VIEW  Comparison: Chest 03/10/2012.  Findings: The lung apices are off the margin of the film.  Right chest tube is in place.  Right pleural effusion is markedly decreased.  There may be a small basilar pneumothorax. Cardiomegaly is again seen.  Atelectasis in the right lung base is noted.  IMPRESSION:  1.  Right chest tube in place with marked decrease in right pleural effusion.  There may be a small right basilar pneumothorax. 2.  Cardiomegaly.   Original Report Authenticated By: Holley Dexter, M.D.     Scheduled Meds:    . acetaminophen  1,000 mg Intravenous Q6H  . budesonide-formoterol  2 puff Inhalation BID  . calcium acetate  667 mg Oral TID WC  . Chlorhexidine Gluconate Cloth  6 each Topical Q0600  . darbepoetin (ARANESP) injection - DIALYSIS  40 mcg Intravenous Q Wed-HD  . doxercalciferol  1 mcg Intravenous Q M,W,F-HD  . feeding supplement  1 Container Oral Q24H  . gabapentin  100 mg Oral TID  . hydrocerin   Topical BID  . insulin aspart  0-24 Units Subcutaneous Q4H  . metoprolol succinate  25 mg Oral QHS  . mupirocin ointment  1 application Nasal BID  . pantoprazole  40 mg Oral Daily  .  polyethylene glycol  17 g Oral Daily  . senna-docusate  1 tablet Oral BID  . simvastatin  40 mg Oral QHS  . sodium chloride  3 mL Intravenous Q12H  . Tamsulosin HCl  0.4 mg Oral QPC supper  . tiotropium  18 mcg Inhalation Daily   Continuous Infusions:    . sodium chloride 50 mL/hr at 02/29/2012 1730      Time spent: 30 MINUTES    Dayra Rapley C  [facility] Pager [phone] If 8PM-8AM, please contact night-coverage at [email or web address], password [facility] 03/12/2012, 2:57 PM  LOS: 16 days

## 2012-03-12 NOTE — Op Note (Signed)
NAMEBEDFORD, WINSOR NO.:  [id number]  MEDICAL RECORD NO.:  [id number]  LOCATION:  2301                         FACILITY:  [facility]  PHYSICIAN:  Kerin Perna, M.D.  DATE OF BIRTH:  [birth year]  DATE OF PROCEDURE:  03/10/2012 DATE OF DISCHARGE:                              OPERATIVE REPORT   OPERATION: 1. Subxiphoid pericardial window. 2. Place in right pleural chest tube.  PREOPERATIVE DIAGNOSES: 1. Large pericardial effusion. 2. Large right pleural effusion.  POSTOPERATIVE DIAGNOSES: 1. Chronic systolic heart failure. 2. Chronic renal insufficiency on hemodialysis.  SURGEON:  Kerin Perna, M.D.  ASSISTANT:  Doree Fudge, PA-C  INDICATIONS:  The patient is a 66 year old Caucasian male, smoker, ex- alcoholic with chronic systolic heart failure presented with shortness of breath, large pleural effusion, which recurred after thoracentesis and a large pericardial effusion with acute on chronic renal failure requiring hemodialysis.  Drainage of the pericardial effusion and pleural effusion was recommended.  I discussed the procedure with the patient and reviewed the results of his echo and x-ray studies and he demonstrated his understanding and agreed to proceed with the procedure  under general anesthesia.  OPERATIVE PROCEDURE:  The patient was brought to the operating room and placed supine on the operating table where general anesthesia was induced.  The patient remained hemodynamically stable.  The chest was prepped and draped as a sterile field.  A proper time-out was performed. An incision was made centered over the xiphoid.  The xiphoid was excised.  The sternal elevating retractor was placed and the soft tissue anterior to the pericardium was cleared away.  An incision was made in the pericardium with a #15 blade scalpel.  An 800 mL of clear-yellow fluid was drained from the pericardium.  This was sent for cytology and culture.  A  generous portion of the anterior pericardium was excised and sent for pathology as well as culture.  The anterior surface of the heart appeared to be normal without inflammation or exudate.  The pericardium itself was not significantly thickened or inflamed.  A 28 soft chest tube was then placed dependently in the pericardial space and brought through a separate incision and secured to the skin. The incision was then closed in layers using Vicryl and Dermabond was applied.  A right chest tube was then placed in the right anterior axillary line with fifth interspace using a 28-French chest tube, directed posteriorly.  This drained 1.5 L of clear-yellow fluid.  This was sent for cytology as well as culture.  It was secured to the skin.  Both chest tubes were connected to an underwater seal Pleur-Evac drainage system, and sterile dressings were applied.  The patient was reversed from anesthesia, and the plan was returned to the recovery room, off the ventilator.     Kerin Perna, M.D.     PV/MEDQ  D:  03/19/2012  T:  03/12/2012  Job:  [id number]

## 2012-03-12 NOTE — Progress Notes (Signed)
1 Day Post-Op Procedure(s) (LRB): SUBXYPHOID PERICARDIAL WINDOW (N/A) CHEST TUBE INSERTION (Right) Subjective: Successfully extubated, breathing comfortably Out of bed to chair Minimal chest tube drainage  Objective: Vital signs in last 24 hours: Temp:  [96 F (35.6 C)-98.1 F (36.7 C)] 97.6 F (36.4 C) (12/21 1713) Pulse Rate:  [80-87] 83  (12/21 1600) Cardiac Rhythm:  [-] Ventricular paced (12/21 0730) Resp:  [0-24] 19  (12/21 1600) BP: (90-152)/(44-77) 97/54 mmHg (12/21 1600) SpO2:  [94 %-100 %] 98 % (12/21 1600) Arterial Line BP: (88-149)/(44-67) 110/44 mmHg (12/21 1300) FiO2 (%):  [39.7 %-100 %] 39.7 % (12/21 0900) Weight:  [169 lb 12.1 oz (77 kg)] 169 lb 12.1 oz (77 kg) (12/21 0500)  Hemodynamic parameters for last 24 hours:   Stable Intake/Output from previous day: 12/20 0701 - 12/21 0700 In: 1492.1 [I.V.:1242.1; IV Piggyback:250] Out: 4845 [Urine:50; Chest Tube:495] Intake/Output this shift: Total I/O In: 450 [I.V.:450] Out: 750 [Urine:350; Chest Tube:400]  Lungs clear  Lab Results:  Basename 03/12/12 0408 02/25/2012 0800  WBC 8.0 8.7  HGB 9.8* 10.0*  HCT 31.7* 34.4*  PLT 164 195   BMET:  Basename 03/12/12 0408 03/13/2012 0800  NA 130* 131*  K 4.0 4.7  CL 97 92*  CO2 24 28  GLUCOSE 103* 135*  BUN 27* 48*  CREATININE 2.33* 3.44*  CALCIUM 8.2* 8.6    PT/INR:  Basename 03/10/12 1805  LABPROT 17.6*  INR 1.49   ABG    Component Value Date/Time   PHART 7.441 03/12/2012 0852   HCO3 27.4* 03/12/2012 0852   TCO2 29 03/12/2012 0852   ACIDBASEDEF 1.0 03/13/2012 1754   O2SAT 100.0 03/12/2012 0852   CBG (last 3)   Basename 03/12/12 1711 03/12/12 1241 03/12/12 0736  GLUCAP 193* 152* 84    Assessment/Plan: S/P Procedure(s) (LRB): SUBXYPHOID PERICARDIAL WINDOW (N/A) CHEST TUBE INSERTION (Right) Continue current care -  stable postop day one following pericardial window   LOS: 16 days    VAN TRIGT III,Marsella Suman 03/12/2012

## 2012-03-12 NOTE — Progress Notes (Signed)
Subjective:  States he feels a lot better since procedures yesterday Chest tube and pericardial tubes in No urine output since yesterday and foley was removed 2 liters off with HD, 800 cc from PC, 1.8 liters from chest tube Bladder scan pending  Objective Vital signs in last 24 hours: Filed Vitals:   03/12/12 0744 03/12/12 0800 03/12/12 0801 03/12/12 0915  BP:  116/66 119/55 114/60  Pulse:  80 80 87  Temp: 96 F (35.6 C)     TempSrc: Axillary     Resp:  15 20 18   Height:      Weight:      SpO2:  100% 100% 100%   Weight change: -0.1 kg (-3.5 oz)  Intake/Output Summary (Last 24 hours) at 03/12/12 0928 Last data filed at 03/12/12 0800  Gross per 24 hour  Intake 1542.06 ml  Output   4975 ml  Net -3432.94 ml  (2 liters off with dialysis, remainder off as pericardial and pleural fluid)  Physical Exam:  Blood pressure 114/60, pulse 87, temperature 96 F (35.6 C), temperature source Axillary, resp. rate 18, height 5\' 5"  (1.651 m), weight 77 kg (169 lb 12.1 oz), SpO2 100.00%. Older WM  NAD On nasal O2 Chest and pericardial tubes in place Left lung clear Improved air movement right lung Right IJ tem HD cath in place Abdomen soft Edema improved/excoriations LE's  Bladder scan 313 ml  Labs: Basic Metabolic Panel:  Lab 03/12/12 2956 03-26-12 0800 03/10/12 1805 03/10/12 0610 03/09/12 1412 03/08/12 0725 03/06/12 1002  NA 130* 131* 128* 130* 127* 130* 125*  K 4.0 4.7 4.5 4.3 4.5 4.5 5.0  CL 97 92* 91* 92* 88* 88* 83*  CO2 24 28 23 29 28 30 30   GLUCOSE 103* 135* 156* 116* 127* 142* 102*  BUN 27* 48* 41* 36* 63* 88* 114*  CREATININE 2.33* 3.44* 2.87* 2.37* 2.84* 2.87* 3.30*  ALB -- -- -- -- -- -- --  CALCIUM 8.2* 8.6 8.6 8.4 8.6 8.4 8.1*  PHOS -- 4.7* -- 3.6 -- 6.3* 7.0*   Liver Function Tests:  Lab 03/26/2012 0800 03/10/12 1805 03/10/12 0610 03/09/12 1412 03/07/12 1718  AST -- 21 -- 24 --  ALT -- 13 -- 13 11  ALKPHOS -- 81 -- 84 --  BILITOT -- 0.5 -- 0.6 --  PROT -- 7.6  -- 7.9 --  ALBUMIN 2.7* 2.7* 2.7* -- --    CBC:  Lab 03/12/12 0408 03/26/12 0800 03/10/12 1805 03/10/12 0610 03/09/12 1412  WBC 8.0 8.7 6.9 7.8 --  NEUTROABS -- -- -- -- 7.2  HGB 9.8* 10.0* 10.0* 9.6* --  HCT 31.7* 34.4* 33.6* 32.5* --  MCV 78.9 81.3 80.4 79.5 --  PLT 164 195 171 191 --  CBG:  Lab 03/12/12 0736 03/12/12 0324 26-Mar-2012 2323 March 26, 2012 1947 March 26, 2012 1139  GLUCAP 84 103* 99 107* 120*    Iron Studies:  Lab 03/08/12 0725  IRON 22*  TIBC 266  TRANSFERRIN --  FERRITIN 131   Studies/Results: Dg Chest 2 View  03/10/2012  *RADIOLOGY REPORT*  Clinical Data: Pericardial window procedure in the morning  CHEST - 2 VIEW  Comparison: Chest CT without contrast 03/09/2012  Findings: Marked enlargement cardiopericardial silhouette. Moderate pulmonary vascular congestion.  Moderate right pleural effusion.  Small left pleural effusion.  Left chest wall pacer with leads in the coronary sinus and right ventricle. Right IJ  catheter terminates near the cavoatrial junction.  IMPRESSION:  1.  Marked enlargement of the cardiopericardial silhouette, stable.  The patient has a known pericardial effusion as described on chest CT performed yesterday. 2.  Moderate right and small left pleural effusions, stable.   Original Report Authenticated By: Britta Mccreedy, M.D.    Dg Chest Port 1 View  03/12/2012  *RADIOLOGY REPORT*  Clinical Data: Postop pericardial window  PORTABLE CHEST - 1 VIEW  Comparison: Portable chest x-ray of 03/23/2012  Findings: The tip of the endotracheal tube is approximally 4.5 cm above the carina.  The right chest tube is present and no definite right pneumothorax is seen.  There is mild right basilar atelectasis and probable small right effusion present. Cardiomegaly is stable and there does appear to be pulmonary vascular congestion present.  A permanent pacemaker remains.  Right central venous line tips overlie the expected SVC - RA junction.  IMPRESSION:  1.  Right chest tube.   No pneumothorax. 2.  Cardiomegaly and probable pulmonary vascular congestion. 3.  Endotracheal tube tip 4.5 cm above the carina.   Original Report Authenticated By: Dwyane Dee, M.D.    Dg Chest Portable 1 View  23-Mar-2012  *RADIOLOGY REPORT*  Clinical Data: Status post chest tube and ET tube placement.  PORTABLE CHEST - 1 VIEW  Comparison: Plain film chest 2012/03/23 at 3:17 p.m. 12/03/1911. CT chest 03/09/2012.  Findings: Endotracheal tube is new since yesterday's examination. The tip is in good position, just below the clavicular heads and well above the carina.  Right chest tube is identified.  Right pleural effusion is markedly decreased.  Increased lucency in the right lung base may be due to an anterior pneumothorax.  The left lung is expanded and clear.  There is cardiomegaly.  IMPRESSION:  1.  Right chest tube in place with marked decrease in a right pleural effusion.  Possible small anterior pneumothorax noted. 2.  ET tube in good position. 3.  Marked cardiomegaly.   Original Report Authenticated By: Holley Dexter, M.D.    Dg Chest Portable 1 View  03/23/12  *RADIOLOGY REPORT*  Clinical Data: Confirm placement of right chest tube.  PORTABLE CHEST - 1 VIEW  Comparison: Chest 03/10/2012.  Findings: The lung apices are off the margin of the film.  Right chest tube is in place.  Right pleural effusion is markedly decreased.  There may be a small basilar pneumothorax. Cardiomegaly is again seen.  Atelectasis in the right lung base is noted.  IMPRESSION:  1.  Right chest tube in place with marked decrease in right pleural effusion.  There may be a small right basilar pneumothorax. 2.  Cardiomegaly.   Original Report Authenticated By: Holley Dexter, M.D.    Medications:    . sodium chloride 50 mL/hr at 03-23-2012 1730      . acetaminophen  1,000 mg Intravenous Q6H  . budesonide-formoterol  2 puff Inhalation BID  . calcium acetate  667 mg Oral TID WC  . Chlorhexidine Gluconate Cloth  6  each Topical Q0600  . darbepoetin (ARANESP) injection - DIALYSIS  40 mcg Intravenous Q Wed-HD  . doxercalciferol  1 mcg Intravenous Q M,W,F-HD  . feeding supplement  1 Container Oral Q24H  . ferric gluconate (FERRLECIT/NULECIT) IV  125 mg Intravenous Daily  . gabapentin  100 mg Oral TID  . hydrocerin   Topical BID  . insulin aspart  0-24 Units Subcutaneous Q4H  . metoprolol succinate  25 mg Oral QHS  . mupirocin ointment  1 application Nasal BID  . pantoprazole  40 mg Oral Daily  . polyethylene glycol  17  g Oral Daily  . senna-docusate  1 tablet Oral BID  . simvastatin  40 mg Oral QHS  . sodium chloride  3 mL Intravenous Q12H  . Tamsulosin HCl  0.4 mg Oral QPC supper  . tiotropium  18 mcg Inhalation Daily    I  have reviewed scheduled and prn medications.  ASSESSMENT/RECOMMEDATIONS   66 yo with baseline CKD (1.7-1.9) from DM, HTN, chronic cardiorenal syndrome, who has diuretic refractory edema in the setting of CHF, cardiorenal exacerbation/severe RV dysfunction requiring initiation of dialysis/UF   1. Acute renal failure on chronic kidney disease stage III   High risk to end up ESRD due to cardiorenal issues   Temp cath placed 12/16   S/p HD X 4, complicated by hypotension  Hope will be better tolerated now that PC effusion drained   With severity of cardiac issues dialysis may be permanent  HD not needed today Reassess for need 12/22 2. Pericardial effusion   S/p subxiphoid pericardial window 12/20   800 cc fluid drained  PC tube in/draining 3. Pleural effusion   s/p 1.8 liter tap 12/10   S/p chest tube 12/20 with initial 1.8 liters out/draining 4. CKD-MBD   binder started (12/16)   PTH 262   Started Hectoral 1 mcg mwf with HD  5. Anemia   Started ESA (darbe 60 12/18)   Started iron load daily X 5 (since don't know definite HD schedule or if will be chronic yet)  6. Ischemic cardiomyopathy   systolic and diastolic heart failure;   EF 55% by echo 03/01/12; RV  dysfunction/severe TR by echo as well  7. CAD with prior stenting (2006)  8.  H/O AFIB with prior AV nodal ablation  9.  H/O pacemaker [name])  10. COPD ([name])  11. BPH with history of urinary retention   Foley was removed but bladder scan 313 ml  Replace foley for time being  Dr. Patsi Sears has seen for urinary retention in past  Has required foley up until now due to inability to void (has not been anuric)   Camille Bal, MD [facility] [phone] pager 03/12/2012, 9:28 AM

## 2012-03-12 NOTE — Procedures (Signed)
Extubation Procedure Note  Patient Details:   Name: [name] DOB: 04-08-1945 MRN: [id number]   Airway Documentation:     Evaluation  O2 sats: stable throughout Complications: No apparent complications Patient did tolerate procedure well. Bilateral Breath Sounds: Clear;Diminished   Yes pt able to vocalize.  Pt extubated at this time per MD order and tolerated well. No complications noted.VS stable. No Stridor noted. Pt has strong, adequate cough. RT will continue to monitor.   Loyal Jacobson [facility] 03/12/2012, 9:18 AM

## 2012-03-12 NOTE — Progress Notes (Signed)
1 Day Post-Op Procedure(s) (LRB): SUBXYPHOID PERICARDIAL WINDOW (N/A) CHEST TUBE INSERTION (Right) Subjective: Postop pericardial window and R chest trube CHF, HD - renal failure, cirrhosis, CODP  Objective: Vital signs in last 24 hours: Temp:  [96 F (35.6 C)-98.1 F (36.7 C)] 96 F (35.6 C) (12/21 0744) Pulse Rate:  [80-86] 80  (12/21 0801) Cardiac Rhythm:  [-] Ventricular paced (12/21 0730) Resp:  [0-24] 20  (12/21 0801) BP: (94-152)/(48-79) 119/55 mmHg (12/21 0801) SpO2:  [96 %-100 %] 100 % (12/21 0801) Arterial Line BP: (88-149)/(46-67) 130/58 mmHg (12/21 0800) FiO2 (%):  [40 %-100 %] 40 % (12/21 0801) Weight:  [162 lb 14.7 oz (73.9 kg)-169 lb 12.1 oz (77 kg)] 169 lb 12.1 oz (77 kg) (12/21 0500)  Hemodynamic parameters for last 24 hours:  nsr  Intake/Output from previous day: 12/20 0701 - 12/21 0700 In: 1492.1 [I.V.:1242.1; IV Piggyback:250] Out: 4845 [Urine:50; Chest Tube:495] Intake/Output this shift: Total I/O In: 50 [I.V.:50] Out: 130 [Chest Tube:130]  Neuro intact  Lab Results:  Basename 03/12/12 0408 03/21/2012 0800  WBC 8.0 8.7  HGB 9.8* 10.0*  HCT 31.7* 34.4*  PLT 164 195   BMET:  Basename 03/12/12 0408 03/08/2012 0800  NA 130* 131*  K 4.0 4.7  CL 97 92*  CO2 24 28  GLUCOSE 103* 135*  BUN 27* 48*  CREATININE 2.33* 3.44*  CALCIUM 8.2* 8.6    PT/INR:  Basename 03/10/12 1805  LABPROT 17.6*  INR 1.49   ABG    Component Value Date/Time   PHART 7.453* 03/12/2012 0529   HCO3 24.8* 03/12/2012 0529   TCO2 25.9 03/12/2012 0529   ACIDBASEDEF 1.0 03/09/2012 1754   O2SAT 99.9 03/12/2012 0529   CBG (last 3)   Basename 03/12/12 0324 02/21/2012 2323 02/29/2012 1947  GLUCAP 103* 99 107*    Assessment/Plan: S/P Procedure(s) (LRB): SUBXYPHOID PERICARDIAL WINDOW (N/A) CHEST TUBE INSERTION (Right) Extubate OOB to chair   LOS: 16 days    VAN TRIGT III,PETER 03/12/2012

## 2012-03-13 ENCOUNTER — Inpatient Hospital Stay (HOSPITAL_COMMUNITY): Payer: Medicare Other

## 2012-03-13 LAB — COMPREHENSIVE METABOLIC PANEL
ALT: 11 U/L (ref 0–53)
Alkaline Phosphatase: 69 U/L (ref 39–117)
CO2: 23 mEq/L (ref 19–32)
Chloride: 95 mEq/L — ABNORMAL LOW (ref 96–112)
GFR calc Af Amer: 30 mL/min — ABNORMAL LOW (ref >90)
GFR calc non Af Amer: 26 mL/min — ABNORMAL LOW (ref >90)
Glucose, Bld: 78 mg/dL (ref 70–99)
Potassium: 4.6 mEq/L (ref 3.5–5.1)
Sodium: 129 mEq/L — ABNORMAL LOW (ref 135–145)
Total Bilirubin: 0.6 mg/dL (ref 0.3–1.2)
Total Protein: 6.5 g/dL (ref 6.0–8.3)

## 2012-03-13 LAB — BLOOD GAS, ARTERIAL
Acid-base deficit: 1.4 mmol/L (ref 0.0–2.0)
Bicarbonate: 23.4 mEq/L (ref 20.0–24.0)
Drawn by: 318431
FIO2: 0.21 %
O2 Saturation: 89.2 %
Patient temperature: 98.6
TCO2: 24.8 mmol/L (ref 0–100)
pCO2 arterial: 43.6 mmHg (ref 35.0–45.0)
pH, Arterial: 7.35 (ref 7.350–7.450)
pO2, Arterial: 57.6 mmHg — ABNORMAL LOW (ref 80.0–100.0)

## 2012-03-13 LAB — CBC
HCT: 34 % — ABNORMAL LOW (ref 39.0–52.0)
Hemoglobin: 10.4 g/dL — ABNORMAL LOW (ref 13.0–17.0)
RBC: 4.32 MIL/uL (ref 4.22–5.81)
WBC: 8.9 10*3/uL (ref 4.0–10.5)

## 2012-03-13 LAB — GLUCOSE, CAPILLARY
Glucose-Capillary: 117 mg/dL — ABNORMAL HIGH (ref 70–99)
Glucose-Capillary: 131 mg/dL — ABNORMAL HIGH (ref 70–99)
Glucose-Capillary: 60 mg/dL — ABNORMAL LOW (ref 70–99)
Glucose-Capillary: 61 mg/dL — ABNORMAL LOW (ref 70–99)
Glucose-Capillary: 77 mg/dL (ref 70–99)
Glucose-Capillary: 81 mg/dL (ref 70–99)

## 2012-03-13 MED ORDER — DEXTROSE 5 % IV SOLN
1.0000 g | Freq: Two times a day (BID) | INTRAVENOUS | Status: AC
  Administered 2012-03-13 – 2012-03-14 (×4): 1 g via INTRAVENOUS
  Filled 2012-03-13 (×5): qty 1

## 2012-03-13 MED ORDER — DEXTROSE 5 % IV SOLN
160.0000 mg | Freq: Two times a day (BID) | INTRAVENOUS | Status: AC
  Administered 2012-03-13 – 2012-03-14 (×2): 160 mg via INTRAVENOUS
  Filled 2012-03-13 (×4): qty 16

## 2012-03-13 NOTE — Progress Notes (Signed)
Subjective:  Reports that he feels so much better! Pericardial tube out today Still has chest tube Last dialysis was 12/20 Making some urine (with replacement of foley for urinary retention) - about 600 cc out yesterday  Objective Vital signs in last 24 hours: Filed Vitals:   03/13/12 0800 03/13/12 0900 03/13/12 1000 03/13/12 1100  BP: 113/72 109/58 121/67 95/52  Pulse: 82 80 80 86  Temp: 98.1 F (36.7 C)     TempSrc: Oral     Resp: 20 19 23 18   Height:      Weight:      SpO2: 98% 95% 98% 95%   Weight change: -1.8 kg (-3 lb 15.5 oz)  Intake/Output Summary (Last 24 hours) at 03/13/12 1155 Last data filed at 03/13/12 1100  Gross per 24 hour  Intake   1430 ml  Output   1770 ml  Net   -340 ml    Physical Exam:  Blood pressure 95/52, pulse 86, temperature 98.1 F (36.7 C), temperature source Oral, resp. rate 18, height 5\' 5"  (1.651 m), weight 74.8 kg (164 lb 14.5 oz), SpO2 95.00%. Older WM NAD  On nasal O2  Chest tube on place/pericardial tube out/IJ tem dialysis catheter in place Left lung clear  Improved air movement right lung  Right IJ temp HD cath in place  Abdomen soft/protuberantt  Edema trace-1+ LE's/posterior thigh edema minimal/improved pretibial Excoriations/scarring  LE's/scarring UE's   Labs: Basic Metabolic Panel:  Lab 03/13/12 1610 03/12/12 0408 03/15/2012 0800 03/10/12 1805 03/10/12 0610 03/09/12 1412 03/08/12 0725  NA 129* 130* 131* 128* 130* 127* 130*  K 4.6 4.0 4.7 4.5 4.3 4.5 4.5  CL 95* 97 92* 91* 92* 88* 88*  CO2 23 24 28 23 29 28 30   GLUCOSE 78 103* 135* 156* 116* 127* 142*  BUN 33* 27* 48* 41* 36* 63* 88*  CREATININE 2.45* 2.33* 3.44* 2.87* 2.37* 2.84* 2.87*  ALB -- -- -- -- -- -- --  CALCIUM 8.3* 8.2* 8.6 8.6 8.4 8.6 8.4  PHOS 3.9 -- 4.7* -- 3.6 -- 6.3*   Liver Function Tests:  Lab 03/13/12 0356 03/14/2012 0800 03/10/12 1805 03/09/12 1412  AST 16 -- 21 24  ALT 11 -- 13 13  ALKPHOS 69 -- 81 84  BILITOT 0.6 -- 0.5 0.6  PROT 6.5 -- 7.6 7.9   ALBUMIN 2.2* 2.7* 2.7* --   CBC:  Lab 03/13/12 0356 03/12/12 0408 03/07/2012 0800 03/10/12 1805 03/09/12 1412  WBC 8.9 8.0 8.7 6.9 --  NEUTROABS -- -- -- -- 7.2  HGB 10.4* 9.8* 10.0* 10.0* --  HCT 34.0* 31.7* 34.4* 33.6* --  MCV 78.7 78.9 81.3 80.4 --  PLT 180 164 195 171 --   CBG:  Lab 03/13/12 0821 03/13/12 0405 03/13/12 0119 03/13/12 0034 03/12/12 2354  GLUCAP 77 87 74 61* 60*    Iron Studies:  Lab 03/08/12 0725  IRON 22*  TIBC 266  TRANSFERRIN --  FERRITIN 131   Studies/Results: Dg Chest Port 1 View  03/13/2012  *RADIOLOGY REPORT*  Clinical Data: Post VATS  PORTABLE CHEST - 1 VIEW  Comparison: .  03/12/2012  Findings: Cardiomegaly again noted.  No pulmonary edema.  Stable right chest tube position.  No diagnostic pneumothorax.  Persistent small right pleural effusion with right basilar atelectasis or infiltrate.  Right IJ dual lumen catheter is unchanged in position. Endotracheal tube has been removed.  IMPRESSION: Endotracheal tube has been removed.  Stable right chest tube position.  No diagnostic pneumothorax.  Persistent small right pleural effusion with right basilar atelectasis or infiltrate.   Original Report Authenticated By: Natasha Mead, M.D.    Dg Chest Port 1 View  03/12/2012  *RADIOLOGY REPORT*  Clinical Data: Postop pericardial window  PORTABLE CHEST - 1 VIEW  Comparison: Portable chest x-ray of 03/04/2012  Findings: The tip of the endotracheal tube is approximally 4.5 cm above the carina.  The right chest tube is present and no definite right pneumothorax is seen.  There is mild right basilar atelectasis and probable small right effusion present. Cardiomegaly is stable and there does appear to be pulmonary vascular congestion present.  A permanent pacemaker remains.  Right central venous line tips overlie the expected SVC - RA junction.  IMPRESSION:  1.  Right chest tube.  No pneumothorax. 2.  Cardiomegaly and probable pulmonary vascular congestion. 3.  Endotracheal  tube tip 4.5 cm above the carina.   Original Report Authenticated By: Dwyane Dee, M.D.    Dg Chest Portable 1 View  02/24/2012  *RADIOLOGY REPORT*  Clinical Data: Status post chest tube and ET tube placement.  PORTABLE CHEST - 1 VIEW  Comparison: Plain film chest 03/07/2012 at 3:17 p.m. 12/03/1911. CT chest 03/09/2012.  Findings: Endotracheal tube is new since yesterday's examination. The tip is in good position, just below the clavicular heads and well above the carina.  Right chest tube is identified.  Right pleural effusion is markedly decreased.  Increased lucency in the right lung base may be due to an anterior pneumothorax.  The left lung is expanded and clear.  There is cardiomegaly.  IMPRESSION:  1.  Right chest tube in place with marked decrease in a right pleural effusion.  Possible small anterior pneumothorax noted. 2.  ET tube in good position. 3.  Marked cardiomegaly.   Original Report Authenticated By: Holley Dexter, M.D.    Dg Chest Portable 1 View  03/18/2012  *RADIOLOGY REPORT*  Clinical Data: Confirm placement of right chest tube.  PORTABLE CHEST - 1 VIEW  Comparison: Chest 03/10/2012.  Findings: The lung apices are off the margin of the film.  Right chest tube is in place.  Right pleural effusion is markedly decreased.  There may be a small basilar pneumothorax. Cardiomegaly is again seen.  Atelectasis in the right lung base is noted.  IMPRESSION:  1.  Right chest tube in place with marked decrease in right pleural effusion.  There may be a small right basilar pneumothorax. 2.  Cardiomegaly.   Original Report Authenticated By: Holley Dexter, M.D.    Medications:      . budesonide-formoterol  2 puff Inhalation BID  . calcium acetate  667 mg Oral TID WC  . cefTAZidime (FORTAZ)  IV  1 g Intravenous Q12H  . Chlorhexidine Gluconate Cloth  6 each Topical Q0600  . darbepoetin (ARANESP) injection - DIALYSIS  40 mcg Intravenous Q Wed-HD  . doxercalciferol  1 mcg Intravenous Q  M,W,F-HD  . feeding supplement  1 Container Oral Q24H  . gabapentin  100 mg Oral TID  . insulin aspart  0-24 Units Subcutaneous Q4H  . insulin glargine  30 Units Subcutaneous QHS  . metoprolol succinate  25 mg Oral QHS  . mupirocin ointment  1 application Nasal BID  . pantoprazole  40 mg Oral Daily  . polyethylene glycol  17 g Oral Daily  . senna-docusate  1 tablet Oral BID  . simvastatin  40 mg Oral QHS  . sodium chloride  3 mL Intravenous Q12H  .  Tamsulosin HCl  0.4 mg Oral QPC supper  . tiotropium  18 mcg Inhalation Daily   ASSESSMENT/RECOMMEDATIONS  66 yo with baseline CKD (1.7-1.9) from DM, HTN, chronic cardiorenal syndrome, who had diuretic refractory edema in the setting of CHF, cardiorenal exacerbation/severe RV dysfunction/large pericardial and pleural effusions -  requiring initiation of dialysis/UF   1. Acute renal failure on chronic kidney disease stage III   High risk to end up ESRD due to cardiorenal issues   Temp cath placed 12/16   S/p HD X 4, complicated by hypotension (last HD 12/20)  Making more urine and creatinine only minimally changed from 12/21  (With severity of cardiac issues dialysis may be permanent but would like to reassess diuretic responsiveness now that we have him drier and hs effusion have been drained  Will give 2 doses lasix today and assess response (if lack of response or worsening function will get permcath in to replace temp cath)  HD not needed today; will reassess for need 12/23   2. Pericardial effusion   S/p subxiphoid pericardial window 12/20   800 cc fluid drained   PC tube out  3. Pleural effusion   s/p 1.8 liter tap 12/10   S/p chest tube 12/20 with initial 1.8 liters out/draining  4. CKD-MBD   binder started (12/16)   PTH 262   Started Hectoral 1 mcg mwf with HD  5. Anemia  S tarted ESA (darbe 60 12/18)   Started iron load daily X 5 (since don't know definite HD schedule or if will be chronic yet)  6. Ischemic cardiomyopathy    systolic and diastolic heart failure;   EF 55% by echo 03/01/12; RV dysfunction/severe TR by echo as well  7. CAD with prior stenting (2006)  8. H/O AFIB with prior AV nodal ablation  9. H/O pacemaker [name])  10. COPD ([name])  11. BPH with history of urinary retention   Foley was removed but bladder scan 313 ml   Replaced foley for time being   Dr. Patsi Sears has seen for urinary retention in past   Has required foley up until now due to inability to void (has not been anuric)      I  have reviewed scheduled and prn medications. Camille Bal, MD [facility] [phone] pager 03/13/2012, 11:55 AM

## 2012-03-13 NOTE — Progress Notes (Signed)
2 Days Post-Op Procedure(s) (LRB): SUBXYPHOID PERICARDIAL WINDOW (N/A) CHEST TUBE INSERTION (Right) Subjective: Status post pericardial window and right chest tube placement for large pericardial effusion and large right pleural effusion History of heart failure, chronic renal failure on hemodialysis, COPD Baseline PCO2 55-60 Patient breathing comfortably after drainage of both effusions ready for transfer to step down Will remove pericardial drain today do to minimal output Cytology the fluid so far negative sputum culture so far negative Continue IV antibiotics for another 40 hours due to suspected bronchopneumonia  Objective: Vital signs in last 24 hours: Temp:  [97.2 F (36.2 C)-98.1 F (36.7 C)] 98.1 F (36.7 C) (12/22 0400) Pulse Rate:  [80-88] 80  (12/22 1000) Cardiac Rhythm:  [-] Ventricular paced (12/22 0800) Resp:  [13-25] 23  (12/22 1000) BP: (90-121)/(44-72) 121/67 mmHg (12/22 1000) SpO2:  [91 %-100 %] 98 % (12/22 1000) Arterial Line BP: (110-128)/(36-54) 117/44 mmHg (12/22 0900) Weight:  [164 lb 14.5 oz (74.8 kg)] 164 lb 14.5 oz (74.8 kg) (12/22 0000)  Hemodynamic parameters for last 24 hours:    Intake/Output from previous day: 12/21 0701 - 12/22 0700 In: 1440 [P.O.:240; I.V.:1200] Out: 1540 [Urine:590; Chest Tube:950] Intake/Output this shift: Total I/O In: 190 [P.O.:120; I.V.:70] Out: 360 [Urine:60; Chest Tube:300]  Alert and comfortable Breath sounds distant but clear Pedal edema remains unchanged  Lab Results:  Basename 03/13/12 0356 03/12/12 0408  WBC 8.9 8.0  HGB 10.4* 9.8*  HCT 34.0* 31.7*  PLT 180 164   BMET:  Basename 03/13/12 0356 03/12/12 0408  NA 129* 130*  K 4.6 4.0  CL 95* 97  CO2 23 24  GLUCOSE 78 103*  BUN 33* 27*  CREATININE 2.45* 2.33*  CALCIUM 8.3* 8.2*    PT/INR:  Basename 03/10/12 1805  LABPROT 17.6*  INR 1.49   ABG    Component Value Date/Time   PHART 7.350 03/13/2012 0420   HCO3 23.4 03/13/2012 0420   TCO2  24.8 03/13/2012 0420   ACIDBASEDEF 1.4 03/13/2012 0420   O2SAT 89.2 03/13/2012 0420   CBG (last 3)   Basename 03/13/12 0821 03/13/12 0405 03/13/12 0119  GLUCAP 77 87 74    Assessment/Plan: S/P Procedure(s) (LRB): SUBXYPHOID PERICARDIAL WINDOW (N/A) CHEST TUBE INSERTION (Right) Plan transfer to step down, resume hemodialysis echo, we've right pleural chest tube until drainage less than 150 cc per 24 hours. Hopefully he will not need Pleurx catheter once regular hemodialysis has been established   LOS: 17 days    VAN TRIGT III,Shanyn Preisler 03/13/2012

## 2012-03-13 NOTE — Progress Notes (Signed)
Hypoglycemic Event  CBG: 60  Treatment: 15 GM carbohydrate snackx2  Symptoms: None  Follow-up CBG: Time:0034, 0119 CBG Result:61,74   Possible Reasons for Event: Medication regimen: lantus   Comments/MD notified:will continue to monitor    [name] B  Remember to initiate Hypoglycemia Order Set & complete

## 2012-03-13 NOTE — Progress Notes (Signed)
Discussed pt with Dr Alla German Today, he states he usually takes over pt's care while in ICU and will want [facility] to take Pt back after he is transferred out of ICU (does not need to be seen by [facility] today). Thanks for assistance, please call when ready to have pt moved back to hospitalist.  Donnalee Curry [facility] [phone]

## 2012-03-13 NOTE — Progress Notes (Signed)
Received patient from 2300.  Patient placed on monitor and now sitting in chair.  VVS and patient has no complaints at this time.  Patient oriented to unit and routines.  Will continue to monitor.

## 2012-03-14 ENCOUNTER — Encounter (HOSPITAL_COMMUNITY): Payer: Self-pay | Admitting: Cardiothoracic Surgery

## 2012-03-14 ENCOUNTER — Inpatient Hospital Stay (HOSPITAL_COMMUNITY): Payer: Medicare Other

## 2012-03-14 DIAGNOSIS — E1149 Type 2 diabetes mellitus with other diabetic neurological complication: Secondary | ICD-10-CM

## 2012-03-14 DIAGNOSIS — E871 Hypo-osmolality and hyponatremia: Secondary | ICD-10-CM | POA: Diagnosis present

## 2012-03-14 LAB — RENAL FUNCTION PANEL
Albumin: 2.2 g/dL — ABNORMAL LOW (ref 3.5–5.2)
BUN: 41 mg/dL — ABNORMAL HIGH (ref 6–23)
CO2: 24 mEq/L (ref 19–32)
Calcium: 8.2 mg/dL — ABNORMAL LOW (ref 8.4–10.5)
Creatinine, Ser: 3.08 mg/dL — ABNORMAL HIGH (ref 0.50–1.35)
GFR calc non Af Amer: 20 mL/min — ABNORMAL LOW (ref >90)

## 2012-03-14 LAB — GLUCOSE, CAPILLARY
Glucose-Capillary: 101 mg/dL — ABNORMAL HIGH (ref 70–99)
Glucose-Capillary: 104 mg/dL — ABNORMAL HIGH (ref 70–99)
Glucose-Capillary: 109 mg/dL — ABNORMAL HIGH (ref 70–99)
Glucose-Capillary: 112 mg/dL — ABNORMAL HIGH (ref 70–99)
Glucose-Capillary: 124 mg/dL — ABNORMAL HIGH (ref 70–99)
Glucose-Capillary: 56 mg/dL — ABNORMAL LOW (ref 70–99)
Glucose-Capillary: 67 mg/dL — ABNORMAL LOW (ref 70–99)

## 2012-03-14 LAB — CK TOTAL AND CKMB (NOT AT ARMC)
CK, MB: 5.5 ng/mL — ABNORMAL HIGH (ref 0.3–4.0)
Relative Index: INVALID (ref 0.0–2.5)
Total CK: 40 U/L (ref 7–232)

## 2012-03-14 LAB — CBC
MCV: 80.8 fL (ref 78.0–100.0)
Platelets: 180 10*3/uL (ref 150–400)
RBC: 4.38 MIL/uL (ref 4.22–5.81)
RDW: 20.4 % — ABNORMAL HIGH (ref 11.5–15.5)
WBC: 8.4 10*3/uL (ref 4.0–10.5)

## 2012-03-14 LAB — TROPONIN I: Troponin I: <0.3 ng/mL (ref <0.30)

## 2012-03-14 LAB — TRIGLYCERIDES: Triglycerides: 37 mg/dL (ref <150)

## 2012-03-14 MED ORDER — ALTEPLASE 2 MG IJ SOLR
2.0000 mg | Freq: Once | INTRAMUSCULAR | Status: DC | PRN
  Filled 2012-03-14: qty 2

## 2012-03-14 MED ORDER — NEPRO/CARBSTEADY PO LIQD: 237.0000 mL | ORAL | Status: DC | PRN

## 2012-03-14 MED ORDER — PENTAFLUOROPROP-TETRAFLUOROETH EX AERO: 1.0000 "application " | INHALATION_SPRAY | CUTANEOUS | Status: DC | PRN

## 2012-03-14 MED ORDER — LIDOCAINE HCL (PF) 1 % IJ SOLN: 5.0000 mL | INTRAMUSCULAR | Status: DC | PRN

## 2012-03-14 MED ORDER — DEXTROSE 50 % IV SOLN
INTRAVENOUS | Status: AC
  Administered 2012-03-14: 50 mL
  Filled 2012-03-14: qty 50

## 2012-03-14 MED ORDER — DOXERCALCIFEROL 4 MCG/2ML IV SOLN
INTRAVENOUS | Status: AC
  Administered 2012-03-14: 1 ug
  Filled 2012-03-14: qty 2

## 2012-03-14 MED ORDER — LIDOCAINE-PRILOCAINE 2.5-2.5 % EX CREA: 1.0000 "application " | TOPICAL_CREAM | CUTANEOUS | Status: DC | PRN

## 2012-03-14 MED ORDER — GLUCOSE-VITAMIN C 4-6 GM-MG PO CHEW
CHEWABLE_TABLET | ORAL | Status: AC
  Administered 2012-03-14: 1
  Filled 2012-03-14: qty 1

## 2012-03-14 MED ORDER — HEPARIN SODIUM (PORCINE) 1000 UNIT/ML DIALYSIS: 1000.0000 [IU] | INTRAMUSCULAR | Status: DC | PRN

## 2012-03-14 MED ORDER — SODIUM CHLORIDE 0.9 % IV SOLN: 100.0000 mL | INTRAVENOUS | Status: DC | PRN

## 2012-03-14 MED ORDER — RENA-VITE PO TABS
1.0000 | ORAL_TABLET | Freq: Every day | ORAL | Status: DC
  Administered 2012-03-14 – 2012-03-28 (×14): 1 via ORAL
  Filled 2012-03-14 (×19): qty 1

## 2012-03-14 NOTE — Evaluation (Signed)
Physical Therapy Evaluation Patient Details Name: [name] MRN: [id number] DOB: [birth year] Today's Date: 03/14/2012 Time: 4098-1191 PT Time Calculation (min): 17 min  PT Assessment / Plan / Recommendation Clinical Impression  Pt is a 66 y/o male admitted s/p pericardial window due to pericardial effusion along with the below PT problem list. Pt would benefit from acute PT to maximize independence and facilitate d/c home with HHPT.    PT Assessment  Patient needs continued PT services    Follow Up Recommendations  Home health PT    Does the patient have the potential to tolerate intense rehabilitation      Barriers to Discharge None      Equipment Recommendations  None recommended by PT    Recommendations for Other Services     Frequency Min 3X/week    Precautions / Restrictions Precautions Precautions: Fall Precaution Comments: Right chest tube. Restrictions Weight Bearing Restrictions: No   Pertinent Vitals/Pain 5/10 in right chest due to chest tube. Pt repositioned.      Mobility  Bed Mobility Bed Mobility: Sit to Supine Sit to Supine: 4: Min assist;HOB flat Details for Bed Mobility Assistance: Assist for bilateral LEs to move onto bed. Cues for sequence. Transfers Transfers: Sit to Stand;Stand to Sit (2 trials.) Sit to Stand: 4: Min assist;With upper extremity assist;From toilet Stand to Sit: 4: Min assist;With upper extremity assist;To toilet;To bed Details for Transfer Assistance: Assist for balance with cues for safest hand placement. Ambulation/Gait Ambulation/Gait Assistance: 4: Min assist Ambulation Distance (Feet): 20 Feet Assistive device: 1 person hand held assist Ambulation/Gait Assistance Details: Assist for balance with cues for safety and tall posture. Gait Pattern: Step-through pattern;Decreased stride length;Trunk flexed;Narrow base of support Stairs: No Wheelchair Mobility Wheelchair Mobility: No    Shoulder Instructions      Exercises     PT Diagnosis: Difficulty walking;Acute pain  PT Problem List: Decreased strength;Decreased activity tolerance;Decreased balance;Decreased mobility;Pain PT Treatment Interventions: Gait training;Stair training;Functional mobility training;Therapeutic activities;Balance training;Patient/family education   PT Goals Acute Rehab PT Goals PT Goal Formulation: With patient Time For Goal Achievement: 03/21/12 Potential to Achieve Goals: Good Pt will go Supine/Side to Sit: with modified independence PT Goal: Supine/Side to Sit - Progress: Goal set today Pt will go Sit to Supine/Side: with modified independence PT Goal: Sit to Supine/Side - Progress: Goal set today Pt will go Sit to Stand: with modified independence PT Goal: Sit to Stand - Progress: Goal set today Pt will go Stand to Sit: with modified independence PT Goal: Stand to Sit - Progress: Goal set today Pt will Ambulate: >150 feet;with modified independence;with least restrictive assistive device PT Goal: Ambulate - Progress: Goal set today Pt will Go Up / Down Stairs: 3-5 stairs;with modified independence;with least restrictive assistive device PT Goal: Up/Down Stairs - Progress: Goal set today  Visit Information  Last PT Received On: 03/14/12 Assistance Needed: +1    Subjective Data  Subjective: "I have to go to dialysis." Patient Stated Goal: Get better.   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[employer] Dominant Hand: Right    Cognition  Overall Cognitive Status: Appears within functional limits for tasks  assessed/performed Arousal/Alertness: Awake/alert Orientation Level: Appears intact for  tasks assessed Behavior During Session: [facility] for tasks performed    Extremity/Trunk Assessment Right Upper Extremity Assessment RUE ROM/Strength/Tone: Within functional levels RUE Sensation: WFL - Light Touch RUE Coordination: WFL - gross motor Left Upper Extremity Assessment LUE ROM/Strength/Tone: Within functional levels LUE Sensation: WFL - Light Touch LUE Coordination: WFL - gross motor Right Lower Extremity Assessment RLE ROM/Strength/Tone: Within functional levels RLE Sensation: WFL - Light Touch RLE Coordination: WFL - gross motor Left Lower Extremity Assessment LLE ROM/Strength/Tone: Within functional levels LLE Sensation: WFL - Light Touch LLE Coordination: WFL - gross motor Trunk Assessment Trunk Assessment: Normal   Balance Balance Balance Assessed: No  End of Session PT - End of Session Equipment Utilized During Treatment: Other (comment) (Right chest tube.) Activity Tolerance: Patient tolerated treatment well Patient left: in bed;with call bell/phone within reach Nurse Communication: Mobility status  GP     Cephus Shelling 03/14/2012, 1:43 PM  03/14/2012 Cephus Shelling, PT, DPT [phone]

## 2012-03-14 NOTE — Progress Notes (Addendum)
Inpatient Diabetes Program Recommendations  AACE/ADA: New Consensus Statement on Inpatient Glycemic Control (2013)  Target Ranges:  Prepandial:   less than 140 mg/dL      Peak postprandial:   less than 180 mg/dL (1-2 hours)      Critically ill patients:  140 - 180 mg/dL   Results for KAYA, KLAUSING (MRN [id number]) as of 03/14/2012 12:09  Ref. Range 03/12/2012 23:54 03/13/2012 00:34 03/13/2012 01:19 03/13/2012 04:05 03/13/2012 08:21 03/13/2012 12:04 03/13/2012 17:00 03/13/2012 19:35  Glucose-Capillary Latest Range: 70-99 mg/dL 60 (L) 61 (L) 74 87 77 117 (H) 119 (H) 131 (H)   Results for ALDAHIR, LITAKER (MRN [id number]) as of 03/14/2012 12:09  Ref. Range 03/13/2012 23:24 03/14/2012 03:17 03/14/2012 03:46 03/14/2012 04:32 03/14/2012 05:02 03/14/2012 07:27  Glucose-Capillary Latest Range: 70-99 mg/dL 81 62 (L) 56 (L) 67 (L) 112 (H) 101 (H)   Hypoglycemic yesterday at midnight and hypoglycemic again today at 3am.  Got Lantus 30 units on the night of 12/21. RN held Lantus dose last night.  May need to reduce Lantus dose temporarily.  Inpatient Diabetes Program Recommendations Insulin - Basal: Please decrease Lantus to 25 units QHS. Correction (SSI): Please change SSI to tid ac + HS (currently ordered as Q4 hours and patient on PO diet).  Note: Will follow. Ambrose Finland RN, MSN, CDE Diabetes Coordinator Inpatient Diabetes Program [phone]

## 2012-03-14 NOTE — Progress Notes (Signed)
[facility] PROGRESS NOTE  CAIGE ALMEDA ZOX:[id number] DOB: 03/17/1946 DOA: March 10, 2012 PCP: [name], MD  Brief narrative:  66 year old presents with SOB and lower extremities edema. He was diagnosed with Acute on chronic HF exacerbation. He was found to have pleural effusion and has moderate pericardial effusion with no physiologic tamponade. He develop acute on chronic renal failure. He now will require dialysis. Nephrology following patient .   Assessment/Plan:  Acute on chronic combined systolic and diastolic CHF (congestive heart failure) / Small right pleural effusion  -EF 50 to 55 % from echo on 12/10. Repeated on 12/18 and shows mild to moderately reduced EF of 40-45 %  -off lasix now.  -chest x-ray 02/29/2012 showed an enlarging effusion. thoracentesis with 1.8 L of fluid removed on 12/10. culture shows no growth. added cytology evaluation of pleural fluid. A repeat chest CT on 12/17 still shows moderate to large size rt sided pleural effusion.  -S/p PERICARDIOCENTESIS done 12/20 with drain placed in-status post drain removal on 12/21.  -Also s/p right thoracentesis done with chest tube placed as well on 12/20-appreciate CTS assistance -cards was following  -pt was on vent overnight, extubated 12/21, per Dr Zenaida Niece Tright  Pericardial effusion  -No obvious tamponade physiology. Coumadin has been on hold for pericardial window and cardiology indicated he would need to be on hold for at least a week -Repeat echo on admission shows some decrease in EF but no change in the size of effusion and no tamponade. FT done . -s/p pericardiocentesis on 12/20 as above.  Acute on chronic CKD  -secondary to Heart failure exacerbation, hemodynamically mediated injury and evolution of ATN. Cr baseline 1.6 to 1.9 prior records.  Temporary HD cath placed 03/07/12 . -Dialysis per renal  -Urinary retention  Patient had 708 cc in bladder on bladder scan 12/10. Foley catheter placed.  No hydronephrosis on renal ultrasound.  Continue with Flomax.  Can d/c home with foley, outpatient urology followup. Will need to make sure patient able to afford medications prior to discharge.  Neosporin cream to catheter area for irritation.   Microcytic anemia  consistent with Anemia of chronic disease  Hb stable   Hypokalemia / hyperkalemia  Resolved   Hyponatremia  adjusting with HD   Skin wounds  Seen by wound care nurse 02/28/2012. Benadryl cream when necessary for pruritus. Eucerin cream for maintenance of moisturization.   DM Type II / hypoglycemia  Lantus discontinued on 02/28/2012 secondary to hypoglycemia.  Continue SSI, no further hypoglycemic episodes  A fib  Status post AV nodal ablation. Has pacemaker. Rate controlled, Coumadin on hold For planned pericardiocentesis for 12/20. Cardio recommend holding anticoagulation for at least 1 week.  -His rate is controlled on metoprolol  COPD  Stable. Continue Symbicort and Spiriva.  E. Coli UTI (lower urinary tract infection)  Patient completed antibiotic regimen with rocephin  Chest pain -Troponin and this a.m. negative, even by cardiology in the recommend giving and nitroglycerin if he has any further chest pain. Per cards given his creatinine is not a candidate for acute intervention or cath Hyponatremia -Likely secondary to hypervolemia, follow recheck after dialysis Code Status: Full.  Family Communication:  To home when medically stable   Disposition Plan:pending PT eval.  Consultants:  Cardiology, Dr. Eden Emms.  renal  CTS ( Dr Morton Peters)  Procedures:  Thoracentesis 12/10.  Pericardiocentesis and rt thoracentessis  on 12/20  Antibiotics:  Rocephin 02/26/2012--->02/29/12  cipro 12/9  zinacef 12/19->12/20 Fortaz on 12/22 for 4 doses  HPI/Subjective: States he had chest x-ray this a.m. with radiation down his left arm as well as shortness of breath.  Objective: Filed Vitals:   03/14/12 0319  03/14/12 0505 03/14/12 0641 03/14/12 0733  BP: 106/65 100/65  108/57  Pulse: 81 83 80 84  Temp: 97.6 F (36.4 C)   97.9 F (36.6 C)  TempSrc: Oral   Oral  Resp: 16 19 14    Height:      Weight:      SpO2: 96% 95% 95% 95%    Intake/Output Summary (Last 24 hours) at 03/14/12 1054 Last data filed at 03/14/12 0700  Gross per 24 hour  Intake    992 ml  Output    850 ml  Net    142 ml   Filed Weights   03/13/2012 1105 03/12/12 0500 03/13/12 0000  Weight: 73.9 kg (162 lb 14.7 oz) 77 kg (169 lb 12.1 oz) 74.8 kg (164 lb 14.5 oz)    Exam:   General:  Alert&oriented x3, in NAD  Cardiovascular: S1&S2, regular, no murmurs  Respiratory: i moderate air movement bilaterally, no wheezes, rt chest tube present  Abdomen: soft, NT, ND BS+  Extremities: Trace to +1 edema   Data Reviewed: Basic Metabolic Panel:  Lab 03/14/12 1610 03/13/12 0356 03/12/12 0408 03/20/2012 0800 03/10/12 1805 03/10/12 0610 03/08/12 0725  NA 121* 129* 130* 131* 128* -- --  K 5.1 4.6 4.0 4.7 4.5 -- --  CL 88* 95* 97 92* 91* -- --  CO2 24 23 24 28 23  -- --  GLUCOSE 59* 78 103* 135* 156* -- --  BUN 41* 33* 27* 48* 41* -- --  CREATININE 3.08* 2.45* 2.33* 3.44* 2.87* -- --  CALCIUM 8.2* 8.3* 8.2* 8.6 8.6 -- --  MG -- -- -- -- -- -- --  PHOS 5.6* 3.9 -- 4.7* -- 3.6 6.3*   Liver Function Tests:  Lab 03/14/12 0345 03/13/12 0356 02/23/2012 0800 03/10/12 1805 03/10/12 0610 03/09/12 1412 03/07/12 1718  AST -- 16 -- 21 -- 24 --  ALT -- 11 -- 13 -- 13 11  ALKPHOS -- 69 -- 81 -- 84 --  BILITOT -- 0.6 -- 0.5 -- 0.6 --  PROT -- 6.5 -- 7.6 -- 7.9 --  ALBUMIN 2.2* 2.2* 2.7* 2.7* 2.7* -- --   No results found for this basename: LIPASE:5,AMYLASE:5 in the last 168 hours No results found for this basename: AMMONIA:5 in the last 168 hours CBC:  Lab 03/14/12 0345 03/13/12 0356 03/12/12 0408 03/10/2012 0800 03/10/12 1805 03/09/12 1412  WBC 8.4 8.9 8.0 8.7 6.9 --  NEUTROABS -- -- -- -- -- 7.2  HGB 10.5* 10.4* 9.8* 10.0* 10.0*  --  HCT 35.4* 34.0* 31.7* 34.4* 33.6* --  MCV 80.8 78.7 78.9 81.3 80.4 --  PLT 180 180 164 195 171 --   Cardiac Enzymes:  Lab 03/14/12 0525  CKTOTAL 40  CKMB 5.5*  CKMBINDEX --  TROPONINI <0.30   BNP (last 3 results)  Basename 02/29/12 0505 02/26/12 0105 04/07/11 0934  PROBNP 8263.0* 8685.0* 205.0*   CBG:  Lab 03/14/12 0727 03/14/12 0502 03/14/12 0432 03/14/12 0346 03/14/12 0317  GLUCAP 101* 112* 67* 56* 62*    Recent Results (from the past 240 hour(s))  URINE CULTURE     Status: Normal   Collection Time   03/09/12  6:14 AM      Component Value Range Status Comment   Specimen Description URINE, CLEAN  CATCH   Final    Special Requests NONE   Final    Culture  Setup Time 03/09/2012 15:19   Final    Colony Count NO GROWTH   Final    Culture NO GROWTH   Final    Report Status 03/10/2012 FINAL   Final   URINE CULTURE     Status: Normal   Collection Time   03/13/2012  1:56 AM      Component Value Range Status Comment   Specimen Description URINE, CATHETERIZED   Final    Special Requests CX ADDED AT 0242 ON [id number]   Final    Culture  Setup Time 02/28/2012 02:49   Final    Colony Count NO GROWTH   Final    Culture NO GROWTH   Final    Report Status 03/12/2012 FINAL   Final   SURGICAL PCR SCREEN     Status: Abnormal   Collection Time   03/09/2012  5:16 AM      Component Value Range Status Comment   MRSA, PCR POSITIVE (*) NEGATIVE Final    Staphylococcus aureus POSITIVE (*) NEGATIVE Final   TISSUE CULTURE     Status: Normal (Preliminary result)   Collection Time   03/22/2012  4:43 PM      Component Value Range Status Comment   Specimen Description TISSUE PERICARDIUM   Final    Special Requests NONE   Final    Gram Stain     Final    Value: NO WBC SEEN     NO ORGANISMS SEEN   Culture NO GROWTH 2 DAYS   Final    Report Status PENDING   Incomplete   BODY FLUID CULTURE     Status: Normal (Preliminary result)   Collection Time   02/27/2012  4:50 PM      Component Value Range  Status Comment   Specimen Description FLUID RIGHT PLEURAL   Final    Special Requests NONE   Final    Gram Stain     Final    Value: RARE WBC PRESENT, PREDOMINANTLY MONONUCLEAR     NO ORGANISMS SEEN   Culture NO GROWTH 2 DAYS   Final    Report Status PENDING   Incomplete      Studies: Dg Chest Port 1 View  03/14/2012  *RADIOLOGY REPORT*  Clinical Data: Evaluate chest tube  PORTABLE CHEST - 1 VIEW  Comparison: 03/13/2012; 03/12/2012  Findings: Grossly unchanged enlarged cardiac silhouette and mediastinal contours.  Stable position of support apparatus. Multiple skin folds overlie the right lung apex.  No definite large right-sided pneumothorax.  Grossly unchanged trace bilateral effusions.  Unchanged bilateral mid and lower lung heterogeneous air space opacities.  Pulmonary vasculature remain indistinct with cephalization of flow.  Unchanged bones.  IMPRESSION: 1.  Stable positioning of support apparatus.  No pneumothorax.  2.  Grossly unchanged findings of mild pulmonary edema, trace bilateral effusions and bibasilar opacities, right greater than left, atelectasis versus infiltrate.   Original Report Authenticated By: Tacey Ruiz, MD    Dg Chest Port 1 View  03/13/2012  *RADIOLOGY REPORT*  Clinical Data: Post VATS  PORTABLE CHEST - 1 VIEW  Comparison: .  03/12/2012  Findings: Cardiomegaly again noted.  No pulmonary edema.  Stable right chest tube position.  No diagnostic pneumothorax.  Persistent small right pleural effusion with right basilar atelectasis or infiltrate.  Right IJ dual lumen catheter is unchanged in position. Endotracheal tube has been removed.  IMPRESSION:  Endotracheal tube has been removed.  Stable right chest tube position.  No diagnostic pneumothorax.  Persistent small right pleural effusion with right basilar atelectasis or infiltrate.   Original Report Authenticated By: Natasha Mead, M.D.     Scheduled Meds:    . budesonide-formoterol  2 puff Inhalation BID  . calcium  acetate  667 mg Oral TID WC  . cefTAZidime (FORTAZ)  IV  1 g Intravenous Q12H  . Chlorhexidine Gluconate Cloth  6 each Topical Q0600  . darbepoetin (ARANESP) injection - DIALYSIS  40 mcg Intravenous Q Wed-HD  . doxercalciferol  1 mcg Intravenous Q M,W,F-HD  . feeding supplement  1 Container Oral Q24H  . gabapentin  100 mg Oral TID  . insulin aspart  0-24 Units Subcutaneous Q4H  . insulin glargine  30 Units Subcutaneous QHS  . metoprolol succinate  25 mg Oral QHS  . multivitamin  1 tablet Oral QHS  . mupirocin ointment  1 application Nasal BID  . pantoprazole  40 mg Oral Daily  . polyethylene glycol  17 g Oral Daily  . senna-docusate  1 tablet Oral BID  . simvastatin  40 mg Oral QHS  . sodium chloride  3 mL Intravenous Q12H  . Tamsulosin HCl  0.4 mg Oral QPC supper  . tiotropium  18 mcg Inhalation Daily   Continuous Infusions:      Time spent: 30 MINUTES    Kela Millin  [facility] Pager [phone] If 8PM-8AM, please contact night-coverage at [email or web address], password [facility] 03/14/2012, 10:54 AM  LOS: 18 days

## 2012-03-14 NOTE — Progress Notes (Signed)
Patient ID: [name], male   DOB: [birth year], 66 y.o.   MRN: [id number] Subjective:  Still dyspnic Right chest tube in.  Pain in left arm this am  Enzymes negative ECG paced.  No nitro given Resolved now .  Objective:  Vital Signs in the last 24 hours: Temp:  [97.6 F (36.4 C)-98.1 F (36.7 C)] 97.9 F (36.6 C) (12/23 0733) Pulse Rate:  [80-87] 80  (12/23 0641) Resp:  [11-23] 14  (12/23 0641) BP: (95-121)/(52-69) 100/65 mmHg (12/23 0505) SpO2:  [95 %-100 %] 95 % (12/23 0733) Arterial Line BP: (117)/(44) 117/44 mmHg (12/22 0900)  Intake/Output from previous day: 12/22 0701 - 12/23 0700 In: 1182 [P.O.:880; I.V.:70; IV Piggyback:232] Out: 1210 [Urine:550; Chest Tube:660] Intake/Output from this shift:    Physical Exam: Chronically ill appearing, cachectic, NAD HEENT: Unremarkable Neck:  7 cm JVD, no thyromegally Lungs:  Decreased breath sounds on the right greater than left, scattered rales on the right With chest tube HEART:  Regular rate rhythm, SEM  murmurs, no rubs, no clicks Abd:  Flat, positive bowel sounds, no organomegally, no rebound, no guarding Ext:  2 plus pulses, no edema, no cyanosis, no clubbing Skin:  No rashes no nodules Neuro:  CN II through XII intact, motor grossly intact  Lab Results:  Basename 03/14/12 0345 03/13/12 0356  WBC 8.4 8.9  HGB 10.5* 10.4*  PLT 180 180    Basename 03/14/12 0345 03/13/12 0356  NA 121* 129*  K 5.1 4.6  CL 88* 95*  CO2 24 23  GLUCOSE 59* 78  BUN 41* 33*  CREATININE 3.08* 2.45*    Basename 03/14/12 0525  TROPONINI <0.30   Hepatic Function Panel  Basename 03/14/12 0345 03/13/12 0356  PROT -- 6.5  ALBUMIN 2.2* --  AST -- 16  ALT -- 11  ALKPHOS -- 69  BILITOT -- 0.6  BILIDIR -- --  IBILI -- --   No results found for this basename: CHOL in the last 72 hours No results found for this basename: PROTIME in the last 72 hours  Imaging: Dg Chest Port 1 View  03/14/2012  *RADIOLOGY REPORT*  Clinical  Data: Evaluate chest tube  PORTABLE CHEST - 1 VIEW  Comparison: 03/13/2012; 03/12/2012  Findings: Grossly unchanged enlarged cardiac silhouette and mediastinal contours.  Stable position of support apparatus. Multiple skin folds overlie the right lung apex.  No definite large right-sided pneumothorax.  Grossly unchanged trace bilateral effusions.  Unchanged bilateral mid and lower lung heterogeneous air space opacities.  Pulmonary vasculature remain indistinct with cephalization of flow.  Unchanged bones.  IMPRESSION: 1.  Stable positioning of support apparatus.  No pneumothorax.  2.  Grossly unchanged findings of mild pulmonary edema, trace bilateral effusions and bibasilar opacities, right greater than left, atelectasis versus infiltrate.   Original Report Authenticated By: Tacey Ruiz, MD    Dg Chest Port 1 View  03/13/2012  *RADIOLOGY REPORT*  Clinical Data: Post VATS  PORTABLE CHEST - 1 VIEW  Comparison: .  03/12/2012  Findings: Cardiomegaly again noted.  No pulmonary edema.  Stable right chest tube position.  No diagnostic pneumothorax.  Persistent small right pleural effusion with right basilar atelectasis or infiltrate.  Right IJ dual lumen catheter is unchanged in position. Endotracheal tube has been removed.  IMPRESSION: Endotracheal tube has been removed.  Stable right chest tube position.  No diagnostic pneumothorax.  Persistent small right pleural effusion with right basilar atelectasis or infiltrate.   Original Report Authenticated By: Natasha Mead,  M.D.     Cardiac Studies: Telemetry - atrial fibrillation with biventricular pacing Assessment/Plan:  1. acute on chronic diastolic heart failure 2. chronic atrial fibrillation, status post AV node ablation 3. status post biventricular pacemaker 4. large, persistent, pleural effusion right greater than left 5. moderate to large pericardial effusion 6. end-stage renal disease, with initiation of hemodialysis 7. marked weight loss  Not sure  what pain in arm was this am but appears not to be angina.  If it recurs would give nitro to see if it helps.  With Cr 3 not a candidate for acute intervention or cath Unless clinical scenario for MI clear.  Renal failure and low albumin not helping to mobilize fluid.    LOS: 18 days    [name] 03/14/2012, 8:16 AM

## 2012-03-14 NOTE — Progress Notes (Signed)
Subjective: Interval History: none.  Objective: Vital signs in last 24 hours: Temp:  [97.6 F (36.4 C)-98.1 F (36.7 C)] 97.9 F (36.6 C) (12/23 0733) Pulse Rate:  [80-87] 84  (12/23 0733) Resp:  [11-19] 14  (12/23 0641) BP: (95-108)/(52-69) 108/57 mmHg (12/23 0733) SpO2:  [95 %-100 %] 95 % (12/23 0733) Weight change:   Intake/Output from previous day: 12/22 0701 - 12/23 0700 In: 1182 [P.O.:880; I.V.:70; IV Piggyback:232] Out: 1210 [Urine:550; Chest Tube:660] Intake/Output this shift:    General appearance: cooperative, pale and slowed mentation Resp: diminished breath sounds bilaterally and rales bibasilar Cardio: regular rate and rhythm and systolic murmur: holosystolic 2/6, blowing at apex GI: obese pos bs, liver down 8 cm Extremities: edema 4+  Lab Results:  [facility] 03/14/12 0345 03/13/12 0356  WBC 8.4 8.9  HGB 10.5* 10.4*  HCT 35.4* 34.0*  PLT 180 180   BMET:  Basename 03/14/12 0345 03/13/12 0356  NA 121* 129*  K 5.1 4.6  CL 88* 95*  CO2 24 23  GLUCOSE 59* 78  BUN 41* 33*  CREATININE 3.08* 2.45*  CALCIUM 8.2* 8.3*   No results found for this basename: PTH:2 in the last 72 hours Iron Studies: No results found for this basename: IRON,TIBC,TRANSFERRIN,FERRITIN in the last 72 hours  Studies/Results: Dg Chest Port 1 View  03/14/2012  *RADIOLOGY REPORT*  Clinical Data: Evaluate chest tube  PORTABLE CHEST - 1 VIEW  Comparison: 03/13/2012; 03/12/2012  Findings: Grossly unchanged enlarged cardiac silhouette and mediastinal contours.  Stable position of support apparatus. Multiple skin folds overlie the right lung apex.  No definite large right-sided pneumothorax.  Grossly unchanged trace bilateral effusions.  Unchanged bilateral mid and lower lung heterogeneous air space opacities.  Pulmonary vasculature remain indistinct with cephalization of flow.  Unchanged bones.  IMPRESSION: 1.  Stable positioning of support apparatus.  No pneumothorax.  2.  Grossly unchanged  findings of mild pulmonary edema, trace bilateral effusions and bibasilar opacities, right greater than left, atelectasis versus infiltrate.   Original Report Authenticated By: Tacey Ruiz, MD    Dg Chest Port 1 View  03/13/2012  *RADIOLOGY REPORT*  Clinical Data: Post VATS  PORTABLE CHEST - 1 VIEW  Comparison: .  03/12/2012  Findings: Cardiomegaly again noted.  No pulmonary edema.  Stable right chest tube position.  No diagnostic pneumothorax.  Persistent small right pleural effusion with right basilar atelectasis or infiltrate.  Right IJ dual lumen catheter is unchanged in position. Endotracheal tube has been removed.  IMPRESSION: Endotracheal tube has been removed.  Stable right chest tube position.  No diagnostic pneumothorax.  Persistent small right pleural effusion with right basilar atelectasis or infiltrate.   Original Report Authenticated By: Natasha Mead, M.D.     I have reviewed the patient's current medications.  Assessment/Plan: 1 CKD/AKI vol xs. Low SNa with xs vol intake (over 1500cc on tray).  Will need dialysis for vol, Na level. 2 Anemia on epo. fe 3 Afib paced reg now. 4 Pericard effusion s/p Window 5 Pleural effusion 6 HPTH 7 DM  P HD, epo, fe, fluid restrict.    LOS: 18 days   Nicolena Schurman L 03/14/2012,10:14 AM

## 2012-03-14 NOTE — Progress Notes (Addendum)
0400 CBG was 62, pt asymptomatic at this time and pt given 15g snack. CBG recheck 56 and pt given 15g glucose tablets. On 3rd check pt CBG 67 and pt given 25ml of D50 IV. Cbg came up to 112.   At approximately 0505 pt called out complaining of left chest pain, radiating down left arm at a level 5/10. Pt compare pain to a previous MI. Pt O2 increased to 2L per min, VVS, and an ekg obtained with no changes from previous study. MD Morton Peters notified and orders for cardiac panel received. Pt now resting and states that the pain has subsided to 0/10.

## 2012-03-14 NOTE — Progress Notes (Addendum)
                   [address]            [place] [id number]          [phone]    3 Days Post-Op Procedure(s) (LRB): SUBXYPHOID PERICARDIAL WINDOW (N/A) CHEST TUBE INSERTION (Right)  Subjective: Patient states he had chest pressure/pain with radiation down his right arm earlier this am. He also had some shortness of breath with this. He has had MI in the past and said it sort of felt like that. Currently, he has no chest pain/pressure or radiation and his breathing is improved.  Objective: Vital signs in last 24 hours: Temp:  [97.6 F (36.4 C)-98.1 F (36.7 C)] 97.6 F (36.4 C) (12/23 0319) Pulse Rate:  [80-87] 80  (12/23 0641) Cardiac Rhythm:  [-] Ventricular paced (12/23 0319) Resp:  [11-23] 14  (12/23 0641) BP: (95-121)/(52-72) 100/65 mmHg (12/23 0505) SpO2:  [95 %-100 %] 95 % (12/23 0733) Arterial Line BP: (117-120)/(44-49) 117/44 mmHg (12/22 0900)     Intake/Output from previous day: 12/22 0701 - 12/23 0700 In: 1182 [P.O.:880; I.V.:70; IV Piggyback:232] Out: 1110 [Urine:450; Chest Tube:660]   Physical Exam:  Cardiovascular: V paced on tele Pulmonary: Crackles bilaterally Abdomen: Soft, non tender, bowel sounds present. Extremities: Bilateral lower extremity edema and chronic venous stasis changes Wounds: Dressings are clean and dry.  No Chest Tube: No air leak. Is to water seal  Lab Results: CBC: Basename 03/14/12 0345 03/13/12 0356  WBC 8.4 8.9  HGB 10.5* 10.4*  HCT 35.4* 34.0*  PLT 180 180   BMET:  Basename 03/14/12 0345 03/13/12 0356  NA 121* 129*  K 5.1 4.6  CL 88* 95*  CO2 24 23  GLUCOSE 59* 78  BUN 41* 33*  CREATININE 3.08* 2.45*  CALCIUM 8.2* 8.3*    PT/INR: No results found for this basename: LABPROT,INR in the last 72 hours ABG:  INR: Will add last result for INR, ABG once components are confirmed Will add last 4 CBG results once components are confirmed  Assessment/Plan:  1. CV - V paced. Troponin 0.3, CK MB  5.5, and total CK 40.EKG obtained. Will discuss with Dr. Donata Clay if needs cardiology evaluation 2.  Pulmonary - On 2 liters of oxygen via [place] and saturation is stable.Chest tube with 660 cc of output. Chest X-ray this am shows cardiomegaly, no pneumothorax, trace bilateral pleural effusions.Await final cytology, cultures.Chest to remain for now 3.H and H stable at 10.5 and 35.4 4.CKD-HD per nephrology  ZIMMERMAN,DONIELLE MPA-C 03/14/2012,7:50 AM  Will leave right chest tube in for now. Hopefully with dialysis now established his fluid overload will improve and gradually reduce the amount of right pleural effusion. Leave chest tube in place until output less than 100 cc in 24 hours.  Pericardial tissue was negative pathology and culture Pericardial fluid has negative cytology and culture Fluid accumulation related to heart failure and renal failure

## 2012-03-14 NOTE — Progress Notes (Signed)
Utilization review completed.  

## 2012-03-14 NOTE — Procedures (Signed)
I was present at this session.  I have reviewed the session itself and made appropriate changes.  No V Na with low SNa, and new to HD.  BP may limit vol off. [name] L 12/23/20132:45 PM

## 2012-03-14 NOTE — Progress Notes (Signed)
NUTRITION FOLLOW UP  Intervention:   Editor, commissioning daily to help meet nutrition needs.  Nutrition Dx:   Inadequate oral intake r/t poor appetite AEB poor meal completion, resolved.  Goal:   Pt to meet >/= 90% of their estimated nutrition needs, met.  Monitor:   PO intake, weight trend, labs, I/O's  Assessment:   Patient is currently in HD.  Has been eating very well, consuming 85-100% of meals.  Height: Ht Readings from Last 1 Encounters:  03/07/12 5\' 5"  (1.651 m)    Weight Status:   Wt Readings from Last 1 Encounters:  03/14/12 178 lb 12.7 oz (81.1 kg)    Re-estimated needs:  Kcal: 2000 - 2200 kcal  Protein: 86 - 100 grams  Fluid: 1.2 liters daily  Skin: stage 2 sacral wound  Diet Order: Renal 80/90-2-2 with 1200 ml fluid restriction; Resource Breeze PO daily   Intake/Output Summary (Last 24 hours) at 03/14/12 1428 Last data filed at 03/14/12 1200  Gross per 24 hour  Intake    742 ml  Output    710 ml  Net     32 ml    Last BM: 12/22   Labs:   Lab 03/14/12 0345 03/13/12 0356 03/12/12 0408 03/18/2012 0800  NA 121* 129* 130* --  K 5.1 4.6 4.0 --  CL 88* 95* 97 --  CO2 24 23 24  --  BUN 41* 33* 27* --  CREATININE 3.08* 2.45* 2.33* --  CALCIUM 8.2* 8.3* 8.2* --  MG -- -- -- --  PHOS 5.6* 3.9 -- 4.7*  GLUCOSE 59* 78 103* --    CBG (last 3)   Basename 03/14/12 1242 03/14/12 0727 03/14/12 0502  GLUCAP 104* 101* 112*    Scheduled Meds:   . budesonide-formoterol  2 puff Inhalation BID  . calcium acetate  667 mg Oral TID WC  . cefTAZidime (FORTAZ)  IV  1 g Intravenous Q12H  . Chlorhexidine Gluconate Cloth  6 each Topical Q0600  . darbepoetin (ARANESP) injection - DIALYSIS  40 mcg Intravenous Q Wed-HD  . doxercalciferol  1 mcg Intravenous Q M,W,F-HD  . feeding supplement  1 Container Oral Q24H  . gabapentin  100 mg Oral TID  . insulin aspart  0-24 Units Subcutaneous Q4H  . insulin glargine  30 Units Subcutaneous QHS  . metoprolol  succinate  25 mg Oral QHS  . multivitamin  1 tablet Oral QHS  . mupirocin ointment  1 application Nasal BID  . pantoprazole  40 mg Oral Daily  . polyethylene glycol  17 g Oral Daily  . senna-docusate  1 tablet Oral BID  . simvastatin  40 mg Oral QHS  . sodium chloride  3 mL Intravenous Q12H  . Tamsulosin HCl  0.4 mg Oral QPC supper  . tiotropium  18 mcg Inhalation Daily    Continuous Infusions:   Joaquin Courts, RD, LDN, CNSC Pager# [phone] After Hours Pager# [phone]

## 2012-03-15 ENCOUNTER — Inpatient Hospital Stay (HOSPITAL_COMMUNITY): Payer: Medicare Other

## 2012-03-15 LAB — GLUCOSE, CAPILLARY
Glucose-Capillary: 103 mg/dL — ABNORMAL HIGH (ref 70–99)
Glucose-Capillary: 166 mg/dL — ABNORMAL HIGH (ref 70–99)
Glucose-Capillary: 156 mg/dL — ABNORMAL HIGH (ref 70–99)

## 2012-03-15 LAB — COMPREHENSIVE METABOLIC PANEL
BUN: 24 mg/dL — ABNORMAL HIGH (ref 6–23)
CO2: 28 mEq/L (ref 19–32)
Calcium: 8.2 mg/dL — ABNORMAL LOW (ref 8.4–10.5)
Creatinine, Ser: 2.36 mg/dL — ABNORMAL HIGH (ref 0.50–1.35)
GFR calc Af Amer: 31 mL/min — ABNORMAL LOW (ref >90)
GFR calc non Af Amer: 27 mL/min — ABNORMAL LOW (ref >90)
Glucose, Bld: 112 mg/dL — ABNORMAL HIGH (ref 70–99)

## 2012-03-15 LAB — CBC
HCT: 31.6 % — ABNORMAL LOW (ref 39.0–52.0)
Hemoglobin: 9.5 g/dL — ABNORMAL LOW (ref 13.0–17.0)
MCH: 24.5 pg — ABNORMAL LOW (ref 26.0–34.0)
MCV: 81.4 fL (ref 78.0–100.0)
RBC: 3.88 MIL/uL — ABNORMAL LOW (ref 4.22–5.81)

## 2012-03-15 LAB — TISSUE CULTURE

## 2012-03-15 LAB — BODY FLUID CULTURE

## 2012-03-15 MED ORDER — FUROSEMIDE 80 MG PO TABS
160.0000 mg | ORAL_TABLET | Freq: Three times a day (TID) | ORAL | Status: DC
  Administered 2012-03-15 – 2012-03-16 (×4): 160 mg via ORAL
  Filled 2012-03-15 (×6): qty 2

## 2012-03-15 MED ORDER — FUROSEMIDE 80 MG PO TABS: 160.0000 mg | ORAL_TABLET | Freq: Two times a day (BID) | ORAL | Status: DC

## 2012-03-15 NOTE — Progress Notes (Addendum)
Pt very lethargic but arousable. VSS, CBG ok, urine output low; will continue to monitor. Notified DR Held gabapentin.

## 2012-03-15 NOTE — Progress Notes (Signed)
[facility] PROGRESS NOTE  [name] ZOX:[id number] DOB: Jun 11, 1945 DOA: 02/27/2012 PCP: [name], MD  Brief narrative:  66 year old presents with SOB and lower extremities edema. He was diagnosed with Acute on chronic HF exacerbation. He was found to have pleural effusion and has moderate pericardial effusion with no physiologic tamponade. He develop acute on chronic renal failure. He is now requiring dialysis.   Assessment/Plan:  Acute on chronic combined systolic and diastolic CHF (congestive heart failure) / right pleural effusion  -EF 50 to 55 % from echo on 12/10. Repeated on 12/18 and shows mild to moderately reduced EF of 40-45 %  -off lasix now.  -chest x-ray 02/29/2012 showed an enlarging effusion. thoracentesis with 1.8 L of fluid removed on 12/10. culture shows no growth. added cytology evaluation of pleural fluid. A repeat chest CT on 12/17 still shows moderate to large size rt sided pleural effusion.  -S/p pericardiocentesis done 12/20 with drain placed. Removed on 12/21. -Also s/p right thoracentesis done with chest tube placed as well on 12/20 -Patient required a vent overnight on 12/20 after the procedure -He is currently stable on step down. His right chest tube draining 100 cc in 24 hours -Repeat chest x-ray today shows a tiny right apical pneumothorax. The right chest tube is stable in position. There is a persistent collapse of the right lower lobe with shift of the mediastinum to the right but with improved airspace opacity.  Pericardial effusion  -No obvious tamponade physiology. Coumadin has been on hold for pericardial window . We will discuss with cardiology on resuming his Coumadin. -Appreciate CTVS and recommendations  Acute on chronic CKD  -secondary to Heart failure exacerbation, hemodynamically mediated injury and evolution of ATN. Cr baseline 1.6 to 1.9 prior records.  Temporary HD cath placed 03/07/12 . Patient now needing  dialysis   -Urinary retention  Patient had 708 cc in bladder on bladder scan 12/10. Foley catheter placed. No hydronephrosis on renal ultrasound.  Continue with Flomax.  Can d/c home with foley, outpatient urology followup. Will need to make sure patient able to afford medications prior to discharge.  Neosporin cream to catheter area for irritation.   Microcytic anemia  consistent with Anemia of chronic disease  Hb stable   Hypokalemia / hyperkalemia  Resolved   Hyponatremia  adjusting with HD   Skin wounds  Seen by wound care nurse 02/28/2012. Benadryl cream when necessary for pruritus. Eucerin cream for maintenance of moisturization.   DM Type II / hypoglycemia  Lantus discontinued on 02/28/2012 secondary to hypoglycemia. He was placed on Lantus 30 units briefly likely post op recently. However as per nurse had to FSG overnight. I will DC his Lantus and maintained only on sliding scale insulin. -  A fib  Status post AV nodal ablation. Has pacemaker. Rate controlled, Coumadin on hold For planned pericardiocentesis for 12/20. Cardio recommend holding anticoagulation for at least 1 week.  -His rate is controlled on metoprolol. Will discuss with cardiology on resuming his A. fib   COPD  Stable. Continue Symbicort and Spiriva.   E. Coli UTI (lower urinary tract infection)  Patient completed antibiotic regimen with rocephin    Code Status: Full.  Family Communication:  Disposition Plan:pending PT eval. , likely will need SNF given deconditioning  Consultants:  Cardiology, Dr. Eden Emms.  renal  CTS ( Dr Morton Peters)   Procedures:  Thoracentesis 12/10.  Pericardiocentesis and rt thoracentessis on 12/20    Antibiotics:  Rocephin 02/26/2012--->02/29/12  cipro  12/9 zinacef 12/19->12/20  Fortaz on 12/22 for 4 doses    HPI/Subjective:  Informs to be feeling better in terms of his shortness of breath. Denies any chest pain however complains of some soreness around midsternal  area.   Objective: Filed Vitals:   03/15/12 0615 03/15/12 0700 03/15/12 0813 03/15/12 1100  BP:  92/51  94/43  Pulse: 81 82  85  Temp:  98.1 F (36.7 C)  97.7 F (36.5 C)  TempSrc:  Oral  Oral  Resp: 20 18  19   Height:      Weight:      SpO2: 96% 99% 100% 100%    Intake/Output Summary (Last 24 hours) at 03/15/12 1421 Last data filed at 03/15/12 1100  Gross per 24 hour  Intake    650 ml  Output   3515 ml  Net  -2865 ml   Filed Weights   03/14/12 1400 03/14/12 1811 03/15/12 0500  Weight: 81.1 kg (178 lb 12.7 oz) 77.9 kg (171 lb 11.8 oz) 76 kg (167 lb 8.8 oz)    Exam:   General:  Elderly thin built male in no acute distress  HEENT: No pallor, moist oral mucosa, right IJ  Cardiovascular: Normal S1 and S2 no murmurs  Respiratory: Diminished breath sounds over right side. No rhonchi or wheeze, right-sided chest tube drain in place  Abdomen: Soft, nontender, bowel sounds present, Foley in place  Extremities: Warm, no edema  CNS: AAO X 3   Data Reviewed: Basic Metabolic Panel:  Lab 03/15/12 4782 03/14/12 0345 03/13/12 0356 03/12/12 0408 02/28/2012 0800 03/10/12 0610  NA 130* 121* 129* 130* 131* --  K 4.9 5.1 4.6 4.0 4.7 --  CL 96 88* 95* 97 92* --  CO2 28 24 23 24 28  --  GLUCOSE 112* 59* 78 103* 135* --  BUN 24* 41* 33* 27* 48* --  CREATININE 2.36* 3.08* 2.45* 2.33* 3.44* --  CALCIUM 8.2* 8.2* 8.3* 8.2* 8.6 --  MG -- -- -- -- -- --  PHOS 3.7 5.6* 3.9 -- 4.7* 3.6   Liver Function Tests:  Lab 03/15/12 0405 03/14/12 0345 03/13/12 0356 03/10/2012 0800 03/10/12 1805 03/09/12 1412  AST 20 -- 16 -- 21 24  ALT 14 -- 11 -- 13 13  ALKPHOS 95 -- 69 -- 81 84  BILITOT 0.5 -- 0.6 -- 0.5 0.6  PROT 6.5 -- 6.5 -- 7.6 7.9  ALBUMIN 2.1* 2.2* 2.2* 2.7* 2.7* --   No results found for this basename: LIPASE:5,AMYLASE:5 in the last 168 hours No results found for this basename: AMMONIA:5 in the last 168 hours CBC:  Lab 03/15/12 0405 03/14/12 0345 03/13/12 0356 03/12/12 0408  02/28/2012 0800 03/09/12 1412  WBC 7.4 8.4 8.9 8.0 8.7 --  NEUTROABS -- -- -- -- -- 7.2  HGB 9.5* 10.5* 10.4* 9.8* 10.0* --  HCT 31.6* 35.4* 34.0* 31.7* 34.4* --  MCV 81.4 80.8 78.7 78.9 81.3 --  PLT 141* 180 180 164 195 --   Cardiac Enzymes:  Lab 03/14/12 0525  CKTOTAL 40  CKMB 5.5*  CKMBINDEX --  TROPONINI <0.30   BNP (last 3 results)  Basename 02/29/12 0505 02/26/12 0105 04/07/11 0934  PROBNP 8263.0* 8685.0* 205.0*   CBG:  Lab 03/15/12 1225 03/15/12 0822 03/15/12 0407 03/15/12 0207 03/14/12 2344  GLUCAP 166* 110* 117* 103* 124*    Recent Results (from the past 240 hour(s))  URINE CULTURE     Status: Normal   Collection Time   03/09/12  6:14 AM  Component Value Range Status Comment   Specimen Description URINE, CLEAN CATCH   Final    Special Requests NONE   Final    Culture  Setup Time 03/09/2012 15:19   Final    Colony Count NO GROWTH   Final    Culture NO GROWTH   Final    Report Status 03/10/2012 FINAL   Final   URINE CULTURE     Status: Normal   Collection Time   03/20/2012  1:56 AM      Component Value Range Status Comment   Specimen Description URINE, CATHETERIZED   Final    Special Requests CX ADDED AT 0242 ON [id number]   Final    Culture  Setup Time 03/22/2012 02:49   Final    Colony Count NO GROWTH   Final    Culture NO GROWTH   Final    Report Status 03/12/2012 FINAL   Final   SURGICAL PCR SCREEN     Status: Abnormal   Collection Time   02/21/2012  5:16 AM      Component Value Range Status Comment   MRSA, PCR POSITIVE (*) NEGATIVE Final    Staphylococcus aureus POSITIVE (*) NEGATIVE Final   TISSUE CULTURE     Status: Normal   Collection Time   03/01/2012  4:43 PM      Component Value Range Status Comment   Specimen Description TISSUE PERICARDIUM   Final    Special Requests NONE   Final    Gram Stain     Final    Value: NO WBC SEEN     NO ORGANISMS SEEN   Culture NO GROWTH 3 DAYS   Final    Report Status 03/15/2012 FINAL   Final   BODY FLUID  CULTURE     Status: Normal   Collection Time   03/08/2012  4:48 PM      Component Value Range Status Comment   Specimen Description FLUID PERICARDIAL   Final    Special Requests NONE   Final    Gram Stain     Final    Value: NO WBC SEEN     NO ORGANISMS SEEN   Culture NO GROWTH 3 DAYS   Final    Report Status 03/15/2012 FINAL   Final   BODY FLUID CULTURE     Status: Normal   Collection Time   03/08/2012  4:50 PM      Component Value Range Status Comment   Specimen Description FLUID RIGHT PLEURAL   Final    Special Requests NONE   Final    Gram Stain     Final    Value: RARE WBC PRESENT, PREDOMINANTLY MONONUCLEAR     NO ORGANISMS SEEN   Culture NO GROWTH 3 DAYS   Final    Report Status 03/15/2012 FINAL   Final      Studies: Dg Chest Port 1 View  03/15/2012  *RADIOLOGY REPORT*  Clinical Data: Pneumothorax  PORTABLE CHEST - 1 VIEW  Comparison: Yesterday  Findings: Tiny right apical pneumothorax.  Right chest tube stable. Right internal jugular central venous catheter stable.  Left subclavian pacemaker device unchanged.  Cardiomegaly. Partial collapse of the right middle and lower lobes with shift of the mediastinum suspected.  Diffuse heterogeneous opacities have improved.  No left pneumothorax.  IMPRESSION: Tiny right apical pneumothorax.  Right chest tube in place.  Persistent collapse of the right lower lobe with shift of the mediastinum to the right.  Improved diffuse  airspace opacities.   Original Report Authenticated By: Jolaine Click, M.D.    Dg Chest Port 1 View  03/14/2012  *RADIOLOGY REPORT*  Clinical Data: Evaluate chest tube  PORTABLE CHEST - 1 VIEW  Comparison: 03/13/2012; 03/12/2012  Findings: Grossly unchanged enlarged cardiac silhouette and mediastinal contours.  Stable position of support apparatus. Multiple skin folds overlie the right lung apex.  No definite large right-sided pneumothorax.  Grossly unchanged trace bilateral effusions.  Unchanged bilateral mid and lower lung  heterogeneous air space opacities.  Pulmonary vasculature remain indistinct with cephalization of flow.  Unchanged bones.  IMPRESSION: 1.  Stable positioning of support apparatus.  No pneumothorax.  2.  Grossly unchanged findings of mild pulmonary edema, trace bilateral effusions and bibasilar opacities, right greater than left, atelectasis versus infiltrate.   Original Report Authenticated By: Tacey Ruiz, MD     Scheduled Meds:   . budesonide-formoterol  2 puff Inhalation BID  . calcium acetate  667 mg Oral TID WC  . Chlorhexidine Gluconate Cloth  6 each Topical Q0600  . darbepoetin (ARANESP) injection - DIALYSIS  40 mcg Intravenous Q Wed-HD  . doxercalciferol  1 mcg Intravenous Q M,W,F-HD  . feeding supplement  1 Container Oral Q24H  . furosemide  160 mg Oral TID  . gabapentin  100 mg Oral TID  . insulin aspart  0-24 Units Subcutaneous Q4H  . insulin glargine  30 Units Subcutaneous QHS  . metoprolol succinate  25 mg Oral QHS  . multivitamin  1 tablet Oral QHS  . mupirocin ointment  1 application Nasal BID  . pantoprazole  40 mg Oral Daily  . polyethylene glycol  17 g Oral Daily  . senna-docusate  1 tablet Oral BID  . simvastatin  40 mg Oral QHS  . sodium chloride  3 mL Intravenous Q12H  . Tamsulosin HCl  0.4 mg Oral QPC supper  . tiotropium  18 mcg Inhalation Daily   Continuous Infusions:     Time spent: 30 minutes    Avarose Mervine  [facility] Pager [phone]. If 8PM-8AM, please contact night-coverage at [email or web address], password [facility] 03/15/2012, 2:21 PM  LOS: 19 days

## 2012-03-15 NOTE — Progress Notes (Addendum)
4 Days Post-Op Procedure(s) (LRB): SUBXYPHOID PERICARDIAL WINDOW (N/A) CHEST TUBE INSERTION (Right) Subjective:  Mr. Moehring is resting comfortably this morning.    Objective: Vital signs in last 24 hours: Temp:  [97 F (36.1 C)-98.2 F (36.8 C)] 98.1 F (36.7 C) (12/24 0700) Pulse Rate:  [80-87] 82  (12/24 0700) Cardiac Rhythm:  [-] Ventricular paced (12/24 0800) Resp:  [9-22] 18  (12/24 0700) BP: (84-107)/(51-66) 92/51 mmHg (12/24 0700) SpO2:  [93 %-100 %] 100 % (12/24 0813) Weight:  [167 lb 8.8 oz (76 kg)-178 lb 12.7 oz (81.1 kg)] 167 lb 8.8 oz (76 kg) (12/24 0500)  Intake/Output from previous day: 12/23 0701 - 12/24 0700 In: 650 [P.O.:600; IV Piggyback:50] Out: 3575 [Urine:275; Chest Tube:100] Intake/Output this shift: Total I/O In: -  Out: 25 [Urine:25]  General appearance: alert, cooperative and no distress Heart: regular rate and rhythm Lungs: diminished breath sounds R>L Abdomen: soft, non-tender; bowel sounds normal; no masses,  no organomegaly Wound: clean and dry  Lab Results:  [facility] 03/15/12 0405 03/14/12 0345  WBC 7.4 8.4  HGB 9.5* 10.5*  HCT 31.6* 35.4*  PLT 141* 180   BMET:  Basename 03/15/12 0405 03/14/12 0345  NA 130* 121*  K 4.9 5.1  CL 96 88*  CO2 28 24  GLUCOSE 112* 59*  BUN 24* 41*  CREATININE 2.36* 3.08*  CALCIUM 8.2* 8.2*    PT/INR: No results found for this basename: LABPROT,INR in the last 72 hours ABG    Component Value Date/Time   PHART 7.350 03/13/2012 0420   HCO3 23.4 03/13/2012 0420   TCO2 24.8 03/13/2012 0420   ACIDBASEDEF 1.4 03/13/2012 0420   O2SAT 89.2 03/13/2012 0420   CBG (last 3)   Basename 03/15/12 0822 03/15/12 0407 03/15/12 0207  GLUCAP 110* 117* 103*    Assessment/Plan: S/P Procedure(s) (LRB): SUBXYPHOID PERICARDIAL WINDOW (N/A) CHEST TUBE INSERTION (Right)  1. Chest tube- 100cc output yesterday, CXR shows trace bilateral pleural effusions, pulmonary edema 2. CKD- per Neprhology 3. Dispo- chest tube  output decreased, will leave in place today, if continue low output possibly remove tube tomorrow  LOS: 19 days    BARRETT, ERIN 03/15/2012   patient examined and medical record reviewed,agree with above note. We've right chest tube until daily output less than 100 cc another day VAN TRIGT III,Shravya Wickwire 03/15/2012

## 2012-03-15 NOTE — Progress Notes (Signed)
Pt lethargic, will not vocalize any answers to questions when assessing orientation and cognition. He will follow commands. Grips equal, he moves all ext, tongue midline and no facial droop noted, VVS, and pt helped back to bed from chair by RN and NT. Rapid Response RN [place] notified and at bedside. NP Schorr aware and will come check on pt. Will continue to monitor.

## 2012-03-15 NOTE — Progress Notes (Signed)
Subjective: Interval History: has no complaint of feels much better.  Objective: Vital signs in last 24 hours: Temp:  [97 F (36.1 C)-98.2 F (36.8 C)] 98.1 F (36.7 C) (12/24 0700) Pulse Rate:  [80-87] 82  (12/24 0700) Resp:  [9-22] 18  (12/24 0700) BP: (84-107)/(51-66) 92/51 mmHg (12/24 0700) SpO2:  [93 %-100 %] 100 % (12/24 0813) Weight:  [76 kg (167 lb 8.8 oz)-81.1 kg (178 lb 12.7 oz)] 76 kg (167 lb 8.8 oz) (12/24 0500) Weight change:   Intake/Output from previous day: 12/23 0701 - 12/24 0700 In: 650 [P.O.:600; IV Piggyback:50] Out: 3575 [Urine:275; Chest Tube:100] Intake/Output this shift: Total I/O In: -  Out: 25 [Urine:25]  General appearance: cooperative, appears older than stated age and pale Resp: diminished breath sounds bilaterally and rales RLL Cardio: S1, S2 normal, systolic murmur: holosystolic 2/6, blowing at apex and friction rub heard scratching at apex GI: pos bs, liver down 6 cm Extremities: edema 3+ R CT  Lab Results:  [facility] 03/15/12 0405 03/14/12 0345  WBC 7.4 8.4  HGB 9.5* 10.5*  HCT 31.6* 35.4*  PLT 141* 180   BMET:  Basename 03/15/12 0405 03/14/12 0345  NA 130* 121*  K 4.9 5.1  CL 96 88*  CO2 28 24  GLUCOSE 112* 59*  BUN 24* 41*  CREATININE 2.36* 3.08*  CALCIUM 8.2* 8.2*   No results found for this basename: PTH:2 in the last 72 hours Iron Studies: No results found for this basename: IRON,TIBC,TRANSFERRIN,FERRITIN in the last 72 hours  Studies/Results: Dg Chest Port 1 View  03/15/2012  *RADIOLOGY REPORT*  Clinical Data: Pneumothorax  PORTABLE CHEST - 1 VIEW  Comparison: Yesterday  Findings: Tiny right apical pneumothorax.  Right chest tube stable. Right internal jugular central venous catheter stable.  Left subclavian pacemaker device unchanged.  Cardiomegaly. Partial collapse of the right middle and lower lobes with shift of the mediastinum suspected.  Diffuse heterogeneous opacities have improved.  No left pneumothorax.   IMPRESSION: Tiny right apical pneumothorax.  Right chest tube in place.  Persistent collapse of the right lower lobe with shift of the mediastinum to the right.  Improved diffuse airspace opacities.   Original Report Authenticated By: Jolaine Click, M.D.    Dg Chest Port 1 View  03/14/2012  *RADIOLOGY REPORT*  Clinical Data: Evaluate chest tube  PORTABLE CHEST - 1 VIEW  Comparison: 03/13/2012; 03/12/2012  Findings: Grossly unchanged enlarged cardiac silhouette and mediastinal contours.  Stable position of support apparatus. Multiple skin folds overlie the right lung apex.  No definite large right-sided pneumothorax.  Grossly unchanged trace bilateral effusions.  Unchanged bilateral mid and lower lung heterogeneous air space opacities.  Pulmonary vasculature remain indistinct with cephalization of flow.  Unchanged bones.  IMPRESSION: 1.  Stable positioning of support apparatus.  No pneumothorax.  2.  Grossly unchanged findings of mild pulmonary edema, trace bilateral effusions and bibasilar opacities, right greater than left, atelectasis versus infiltrate.   Original Report Authenticated By: Tacey Ruiz, MD     I have reviewed the patient's current medications.  Assessment/Plan: 1 CKD Vol xs, got 3.2 liters off yest. Will see if responds to Lasix . bp may limit 2 Pericardial effusion s/p window 3 CHF severe with low bp 4 Anemia epo/fe 5 Malnutrition 6 HPTH vit D 7 Copd  8 Urinary retention  P Lasix, epo/fe, foley, suppl    LOS: 19 days   [name] L 03/15/2012,10:10 AM

## 2012-03-15 NOTE — Progress Notes (Signed)
Physical Therapy Treatment Patient Details Name: [name] MRN: [id number] DOB: 15-Jan-1946 Today's Date: 03/15/2012 Time: 4098-1191 PT Time Calculation (min): 23 min  PT Assessment / Plan / Recommendation Comments on Treatment Session  Pt admitted with pericardial effusion s/p pericardial window and continues to progress with therapy. Pt very motivated and able to increase ambulation with increase tolerance this am.    Follow Up Recommendations  Home health PT     Does the patient have the potential to tolerate intense rehabilitation     Barriers to Discharge        Equipment Recommendations  None recommended by PT    Recommendations for Other Services    Frequency Min 3X/week   Plan Discharge plan remains appropriate;Frequency remains appropriate    Precautions / Restrictions Precautions Precautions: Fall Precaution Comments: Right chest tube. Restrictions Weight Bearing Restrictions: No   Pertinent Vitals/Pain None    Mobility  Bed Mobility Bed Mobility: Not assessed Transfers Transfers: Sit to Stand;Stand to Sit Sit to Stand: 4: Min guard;With upper extremity assist;From chair/3-in-1 Stand to Sit: 4: Min guard;With upper extremity assist;To chair/3-in-1 Details for Transfer Assistance: Guarding for balance with cues for safest hand placement. Ambulation/Gait Ambulation/Gait Assistance: 4: Min assist Ambulation Distance (Feet): 100 Feet Assistive device: Rolling walker Ambulation/Gait Assistance Details: Assist for balance with cues for tall posture and safety inside RW. Gait Pattern: Step-through pattern;Decreased stride length;Trunk flexed;Narrow base of support Stairs: No Wheelchair Mobility Wheelchair Mobility: No    Exercises     PT Diagnosis:    PT Problem List:   PT Treatment Interventions:     PT Goals Acute Rehab PT Goals PT Goal Formulation: With patient Time For Goal Achievement: 03/21/12 Potential to Achieve Goals: Good PT Goal: Sit  to Stand - Progress: Progressing toward goal PT Goal: Stand to Sit - Progress: Progressing toward goal PT Goal: Ambulate - Progress: Progressing toward goal  Visit Information  Last PT Received On: 03/15/12 Assistance Needed: +1    Subjective Data  Subjective: "I am feeling pretty good this morning." Patient Stated Goal: Get better.   Cognition  Overall Cognitive Status: Appears within functional limits for tasks assessed/performed Arousal/Alertness: Awake/alert Orientation Level: Appears intact for tasks assessed Behavior During Session: [facility] for tasks performed    Balance  Balance Balance Assessed: No  End of Session PT - End of Session Equipment Utilized During Treatment: Gait belt (Right chest tube.) Activity Tolerance: Patient tolerated treatment well Patient left: in chair;with call bell/phone within reach Nurse Communication: Mobility status   GP     Cephus Shelling 03/15/2012, 8:28 AM  03/15/2012 Cephus Shelling, PT, DPT [phone]

## 2012-03-16 ENCOUNTER — Inpatient Hospital Stay (HOSPITAL_COMMUNITY): Payer: Medicare Other

## 2012-03-16 LAB — CBC
MCH: 24.4 pg — ABNORMAL LOW (ref 26.0–34.0)
MCHC: 29.3 g/dL — ABNORMAL LOW (ref 30.0–36.0)
MCV: 83.5 fL (ref 78.0–100.0)
Platelets: 150 10*3/uL (ref 150–400)
RBC: 4.01 MIL/uL — ABNORMAL LOW (ref 4.22–5.81)
RDW: 20.8 % — ABNORMAL HIGH (ref 11.5–15.5)

## 2012-03-16 LAB — GLUCOSE, CAPILLARY
Glucose-Capillary: 118 mg/dL — ABNORMAL HIGH (ref 70–99)
Glucose-Capillary: 123 mg/dL — ABNORMAL HIGH (ref 70–99)
Glucose-Capillary: 124 mg/dL — ABNORMAL HIGH (ref 70–99)
Glucose-Capillary: 102 mg/dL — ABNORMAL HIGH (ref 70–99)
Glucose-Capillary: 127 mg/dL — ABNORMAL HIGH (ref 70–99)

## 2012-03-16 LAB — COMPREHENSIVE METABOLIC PANEL
AST: 25 U/L (ref 0–37)
Albumin: 2.2 g/dL — ABNORMAL LOW (ref 3.5–5.2)
BUN: 37 mg/dL — ABNORMAL HIGH (ref 6–23)
Calcium: 8.6 mg/dL (ref 8.4–10.5)
Creatinine, Ser: 3.24 mg/dL — ABNORMAL HIGH (ref 0.50–1.35)
Total Bilirubin: 0.5 mg/dL (ref 0.3–1.2)
Total Protein: 7 g/dL (ref 6.0–8.3)

## 2012-03-16 LAB — PHOSPHORUS: Phosphorus: 5.6 mg/dL — ABNORMAL HIGH (ref 2.3–4.6)

## 2012-03-16 NOTE — Progress Notes (Signed)
Right CT d/c'd per MD order per protocol.  Patient tolerated well, will continue to monitor.

## 2012-03-16 NOTE — Progress Notes (Signed)
Called by bedside RN for patient who is lethargic and not answering questions. Upon arrival to unit patient following commands, answering questions, but with slight hesitation, VSS. Patient able to move all extremities equally. After several minutes talking to patient he is alert and oriented. NP made aware of situation. Will continue to monitor patient, advised bedside RN to call with further needs.

## 2012-03-16 NOTE — Progress Notes (Signed)
5 Days Post-Op Procedure(s) (LRB): SUBXYPHOID PERICARDIAL WINDOW (N/A) CHEST TUBE INSERTION (Right) Subjective: Patient with confusion,lethargy last night since resolved.  Objective: Vital signs in last 24 hours: Temp:  [97.7 F (36.5 C)-98.1 F (36.7 C)] 98.1 F (36.7 C) (12/25 0700) Pulse Rate:  [83-85] 83  (12/25 0400) Cardiac Rhythm:  [-] Ventricular paced (12/25 0400) Resp:  [17-20] 20  (12/25 0400) BP: (90-103)/(43-81) 93/55 mmHg (12/25 0400) SpO2:  [96 %-100 %] 96 % (12/25 0400) Weight:  [169 lb 15.6 oz (77.1 kg)] 169 lb 15.6 oz (77.1 kg) (12/25 0500)  Intake/Output from previous day: 12/24 0701 - 12/25 0700 In: 483 [P.O.:480; I.V.:3] Out: 302 [Urine:302]  General appearance: alert, cooperative and no distress Heart: regular rate and rhythm Lungs: diminished breath sounds R>L Abdomen: soft, non-tender; bowel sounds normal; no masses,  no organomegaly Extremities: extremities normal, atraumatic, no cyanosis or edema Wound: clean and dry  Lab Results:  Basename 03/16/12 0500 03/15/12 0405  WBC 8.8 7.4  HGB 9.8* 9.5*  HCT 33.5* 31.6*  PLT 150 141*   BMET:  Basename 03/16/12 0500 03/15/12 0405  NA 127* 130*  K 5.3* 4.9  CL 91* 96  CO2 26 28  GLUCOSE 110* 112*  BUN 37* 24*  CREATININE 3.24* 2.36*  CALCIUM 8.6 8.2*    PT/INR: No results found for this basename: LABPROT,INR in the last 72 hours ABG    Component Value Date/Time   PHART 7.350 03/13/2012 0420   HCO3 23.4 03/13/2012 0420   TCO2 24.8 03/13/2012 0420   ACIDBASEDEF 1.4 03/13/2012 0420   O2SAT 89.2 03/13/2012 0420   CBG (last 3)   Basename 03/16/12 0337 03/15/12 2332 03/15/12 1934  GLUCAP 126* 124* 123*    Assessment/Plan: S/P Procedure(s) (LRB): SUBXYPHOID PERICARDIAL WINDOW (N/A) CHEST TUBE INSERTION (Right)  1. D/C chest- 0 output yesterday 2. Plan per primary service   LOS: 20 days    Lowella Dandy 03/16/2012

## 2012-03-16 NOTE — Progress Notes (Signed)
[facility] PROGRESS NOTE  BRAXSTON QUINTER ONG:[id number] DOB: February 05, 1946 DOA: 03/02/2012 PCP: [name], MD  Brief narrative:  66 year old presents with SOB and lower extremities edema. He was diagnosed with Acute on chronic HF exacerbation. He was found to have pleural effusion and has moderate pericardial effusion with no physiologic tamponade. He develop acute on chronic renal failure. He is now requiring dialysis.   Assessment/Plan:  Acute on chronic combined systolic and diastolic CHF (congestive heart failure) / right pleural effusion  -EF 50 to 55 % from echo on 12/10. Repeated on 12/18 and shows mild to moderately reduced EF of 40-45 %  -chest x-ray 02/29/2012 showed an enlarging effusion. thoracentesis with 1.8 L of fluid removed on 12/10. culture shows no growth. added cytology evaluation of pleural fluid. A repeat chest CT on 12/17 still shows moderate to large size rt sided pleural effusion.  -S/p pericardiocentesis done 12/20 with drain placed. Removed on 12/21. -Also s/p right thoracentesis done with chest tube placed as well on 12/20 -Patient required a vent overnight on 12/20 after the procedure  Pericardial effusion  -No obvious tamponade physiology. Coumadin has been on hold for pericardial window . We will discuss with cardiology on resuming his Coumadin. -Appreciate CTVS and recommendations  Acute on chronic CKD  -secondary to Heart failure exacerbation, hemodynamically mediated injury and evolution of ATN. Cr baseline 1.6 to 1.9 prior records.  Temporary HD cath placed 03/07/12 . Patient now needing dialysis  -Urinary retention  Patient had 708 cc in bladder on bladder scan 12/10. Foley catheter placed. No hydronephrosis on renal ultrasound.  Continue with Flomax.  Can d/c home with foley, outpatient urology followup. Will need to make sure patient able to afford medications prior to discharge.  Neosporin cream to catheter area for irritation.    Microcytic anemia  consistent with Anemia of chronic disease  Hb stable   Hypokalemia / hyperkalemia  Resolved   Hyponatremia  adjusting with HD   Skin wounds  Seen by wound care nurse 02/28/2012. Benadryl cream when necessary for pruritus. Eucerin cream for maintenance of moisturization.   DM Type II / hypoglycemia  Lantus discontinued on 02/28/2012 secondary to hypoglycemia. He was placed on Lantus 30 units briefly likely post op recently. However as per nurse had to FSG overnight. I will DC his Lantus and maintained only on sliding scale insulin. -  A fib  Status post AV nodal ablation. Has pacemaker. Rate controlled, Coumadin on hold For planned pericardiocentesis for 12/20. Cardio recommend holding anticoagulation for at least 1 week.  -His rate is controlled on metoprolol.    COPD  Stable. Continue Symbicort and Spiriva.   E. Coli UTI (lower urinary tract infection)  Patient completed antibiotic regimen with rocephin    Code Status: Full.  Family Communication:  Disposition Plan:pending PT eval. , likely will need SNF given deconditioning  Consultants:  Cardiology, Dr. Eden Emms.  renal  CTS ( Dr Morton Peters)   Procedures:  Thoracentesis 12/10.  Pericardiocentesis and rt thoracentessis on 12/20    Antibiotics:  Rocephin 02/26/2012--->02/29/12  cipro 12/9 zinacef 12/19->12/20  Fortaz on 12/22 for 4 doses    HPI/Subjective:  He has all CTs out- mild confusion- no complaints.    Objective: Filed Vitals:   03/16/12 0900 03/16/12 1200 03/16/12 1600 03/16/12 1936  BP:  115/63 106/61   Pulse:  85 84   Temp:  98.2 F (36.8 C) 98 F (36.7 C)   TempSrc:  Oral Oral  Resp:  18 16   Height:      Weight:      SpO2: 95% 98% 98% 97%    Intake/Output Summary (Last 24 hours) at 03/16/12 1938 Last data filed at 03/16/12 1633  Gross per 24 hour  Intake   1183 ml  Output    300 ml  Net    883 ml   Filed Weights   03/14/12 1811 03/15/12 0500 03/16/12 0500   Weight: 77.9 kg (171 lb 11.8 oz) 76 kg (167 lb 8.8 oz) 77.1 kg (169 lb 15.6 oz)    Exam:   General:  Elderly thin built male in no acute distress  HEENT: No pallor, moist oral mucosa, right IJ  Cardiovascular: Normal S1 and S2 no murmurs  Respiratory: Diminished breath sounds over right side. No rhonchi or wheeze  Abdomen: Soft, nontender, bowel sounds present, Foley in place  Extremities: Warm, mild edema  CNS: AAO X 3   Data Reviewed: Basic Metabolic Panel:  Lab 03/16/12 0865 03/15/12 0405 03/14/12 0345 03/13/12 0356 03/12/12 0408 03/14/2012 0800  NA 127* 130* 121* 129* 130* --  K 5.3* 4.9 5.1 4.6 4.0 --  CL 91* 96 88* 95* 97 --  CO2 26 28 24 23 24  --  GLUCOSE 110* 112* 59* 78 103* --  BUN 37* 24* 41* 33* 27* --  CREATININE 3.24* 2.36* 3.08* 2.45* 2.33* --  CALCIUM 8.6 8.2* 8.2* 8.3* 8.2* --  MG -- -- -- -- -- --  PHOS 5.6* 3.7 5.6* 3.9 -- 4.7*   Liver Function Tests:  Lab 03/16/12 0500 03/15/12 0405 03/14/12 0345 03/13/12 0356 03/05/2012 0800 03/10/12 1805  AST 25 20 -- 16 -- 21  ALT 14 14 -- 11 -- 13  ALKPHOS 120* 95 -- 69 -- 81  BILITOT 0.5 0.5 -- 0.6 -- 0.5  PROT 7.0 6.5 -- 6.5 -- 7.6  ALBUMIN 2.2* 2.1* 2.2* 2.2* 2.7* --   No results found for this basename: LIPASE:5,AMYLASE:5 in the last 168 hours No results found for this basename: AMMONIA:5 in the last 168 hours CBC:  Lab 03/16/12 0500 03/15/12 0405 03/14/12 0345 03/13/12 0356 03/12/12 0408  WBC 8.8 7.4 8.4 8.9 8.0  NEUTROABS -- -- -- -- --  HGB 9.8* 9.5* 10.5* 10.4* 9.8*  HCT 33.5* 31.6* 35.4* 34.0* 31.7*  MCV 83.5 81.4 80.8 78.7 78.9  PLT 150 141* 180 180 164   Cardiac Enzymes:  Lab 03/14/12 0525  CKTOTAL 40  CKMB 5.5*  CKMBINDEX --  TROPONINI <0.30   BNP (last 3 results)  Basename 02/29/12 0505 02/26/12 0105 04/07/11 0934  PROBNP 8263.0* 8685.0* 205.0*   CBG:  Lab 03/16/12 1547 03/16/12 1148 03/16/12 0748 03/16/12 0337 03/15/12 2332  GLUCAP 144* 102* 106* 126* 124*    Recent  Results (from the past 240 hour(s))  URINE CULTURE     Status: Normal   Collection Time   03/09/12  6:14 AM      Component Value Range Status Comment   Specimen Description URINE, CLEAN CATCH   Final    Special Requests NONE   Final    Culture  Setup Time 03/09/2012 15:19   Final    Colony Count NO GROWTH   Final    Culture NO GROWTH   Final    Report Status 03/10/2012 FINAL   Final   URINE CULTURE     Status: Normal   Collection Time   03/02/2012  1:56 AM      Component Value  Range Status Comment   Specimen Description URINE, CATHETERIZED   Final    Special Requests CX ADDED AT 0242 ON 02/29/2012   Final    Culture  Setup Time 03/09/2012 02:49   Final    Colony Count NO GROWTH   Final    Culture NO GROWTH   Final    Report Status 03/12/2012 FINAL   Final   SURGICAL PCR SCREEN     Status: Abnormal   Collection Time   03/15/2012  5:16 AM      Component Value Range Status Comment   MRSA, PCR POSITIVE (*) NEGATIVE Final    Staphylococcus aureus POSITIVE (*) NEGATIVE Final   TISSUE CULTURE     Status: Normal   Collection Time   03/08/2012  4:43 PM      Component Value Range Status Comment   Specimen Description TISSUE PERICARDIUM   Final    Special Requests NONE   Final    Gram Stain     Final    Value: NO WBC SEEN     NO ORGANISMS SEEN   Culture NO GROWTH 3 DAYS   Final    Report Status 03/15/2012 FINAL   Final   BODY FLUID CULTURE     Status: Normal   Collection Time   02/25/2012  4:48 PM      Component Value Range Status Comment   Specimen Description FLUID PERICARDIAL   Final    Special Requests NONE   Final    Gram Stain     Final    Value: NO WBC SEEN     NO ORGANISMS SEEN   Culture NO GROWTH 3 DAYS   Final    Report Status 03/15/2012 FINAL   Final   BODY FLUID CULTURE     Status: Normal   Collection Time   02/26/2012  4:50 PM      Component Value Range Status Comment   Specimen Description FLUID RIGHT PLEURAL   Final    Special Requests NONE   Final    Gram Stain     Final     Value: RARE WBC PRESENT, PREDOMINANTLY MONONUCLEAR     NO ORGANISMS SEEN   Culture NO GROWTH 3 DAYS   Final    Report Status 03/15/2012 FINAL   Final      Studies: Dg Chest Port 1 View  03/16/2012  *RADIOLOGY REPORT*  Clinical Data: Chest tube placement  PORTABLE CHEST - 1 VIEW  Comparison: 03/15/2012  Findings: Moderate to marked enlargement of the cardial mediastinal silhouette is again noted.  Right-sided chest tube remains in place with trace remaining right pleural effusion.  Trace left pleural effusion is noted.  Stable trace right apical pneumothorax.  Dual lumen right IJ approach central line tips terminate over the right atrium.  Left-sided pacer in place.  Patchy perihilar airspace opacities are stable.  IMPRESSION: Trace right apical pneumothorax with right-sided chest tube in place.  Stable perihilar airspace opacities and pleural effusions which could indicate pulmonary edema.   Original Report Authenticated By: Christiana Pellant, M.D.    Dg Chest Port 1 View  03/15/2012  *RADIOLOGY REPORT*  Clinical Data: Pneumothorax  PORTABLE CHEST - 1 VIEW  Comparison: Yesterday  Findings: Tiny right apical pneumothorax.  Right chest tube stable. Right internal jugular central venous catheter stable.  Left subclavian pacemaker device unchanged.  Cardiomegaly. Partial collapse of the right middle and lower lobes with shift of the mediastinum suspected.  Diffuse heterogeneous opacities have  improved.  No left pneumothorax.  IMPRESSION: Tiny right apical pneumothorax.  Right chest tube in place.  Persistent collapse of the right lower lobe with shift of the mediastinum to the right.  Improved diffuse airspace opacities.   Original Report Authenticated By: Jolaine Click, M.D.     Scheduled Meds:    . budesonide-formoterol  2 puff Inhalation BID  . calcium acetate  667 mg Oral TID WC  . darbepoetin (ARANESP) injection - DIALYSIS  40 mcg Intravenous Q Wed-HD  . doxercalciferol  1 mcg Intravenous Q  M,W,F-HD  . feeding supplement  1 Container Oral Q24H  . furosemide  160 mg Oral TID  . gabapentin  100 mg Oral TID  . insulin aspart  0-24 Units Subcutaneous Q4H  . metoprolol succinate  25 mg Oral QHS  . multivitamin  1 tablet Oral QHS  . pantoprazole  40 mg Oral Daily  . polyethylene glycol  17 g Oral Daily  . senna-docusate  1 tablet Oral BID  . simvastatin  40 mg Oral QHS  . sodium chloride  3 mL Intravenous Q12H  . Tamsulosin HCl  0.4 mg Oral QPC supper  . tiotropium  18 mcg Inhalation Daily   Continuous Infusions:     Time spent: 30 minutes    [facility]  [facility] Pager [phone]. If 8PM-8AM, please contact night-coverage at [email or web address], password [email or web address] 03/16/2012, 7:38 PM  LOS: 20 days

## 2012-03-16 NOTE — Progress Notes (Signed)
Subjective: Interval History: has complaints swollen .  Objective: Vital signs in last 24 hours: Temp:  [97.7 F (36.5 C)-98.1 F (36.7 C)] 98.1 F (36.7 C) (12/25 0800) Pulse Rate:  [83-85] 83  (12/25 0800) Resp:  [17-20] 19  (12/25 0800) BP: (90-103)/(43-81) 102/61 mmHg (12/25 0800) SpO2:  [95 %-100 %] 95 % (12/25 0900) Weight:  [77.1 kg (169 lb 15.6 oz)] 77.1 kg (169 lb 15.6 oz) (12/25 0500) Weight change: -4 kg (-8 lb 13.1 oz)  Intake/Output from previous day: 12/24 0701 - 12/25 0700 In: 483 [P.O.:480; I.V.:3] Out: 302 [Urine:302] Intake/Output this shift: Total I/O In: 150 [P.O.:150] Out: -   General appearance: cooperative, pale and tremulous Resp: diminished breath sounds bilaterally, rales bibasilar and RLL and rhonchi bilaterally Cardio: S1, S2 normal and systolic murmur: holosystolic 2/6, blowing at lower left sternal border GI: pos bs,liver down, mod distension Extremities: edema 3+ Skin: pale dry,  Lab Results:  Basename 03/16/12 0500 03/15/12 0405  WBC 8.8 7.4  HGB 9.8* 9.5*  HCT 33.5* 31.6*  PLT 150 141*   BMET:  Basename 03/16/12 0500 03/15/12 0405  NA 127* 130*  K 5.3* 4.9  CL 91* 96  CO2 26 28  GLUCOSE 110* 112*  BUN 37* 24*  CREATININE 3.24* 2.36*  CALCIUM 8.6 8.2*   No results found for this basename: PTH:2 in the last 72 hours Iron Studies: No results found for this basename: IRON,TIBC,TRANSFERRIN,FERRITIN in the last 72 hours  Studies/Results: Dg Chest Port 1 View  03/16/2012  *RADIOLOGY REPORT*  Clinical Data: Chest tube placement  PORTABLE CHEST - 1 VIEW  Comparison: 03/15/2012  Findings: Moderate to marked enlargement of the cardial mediastinal silhouette is again noted.  Right-sided chest tube remains in place with trace remaining right pleural effusion.  Trace left pleural effusion is noted.  Stable trace right apical pneumothorax.  Dual lumen right IJ approach central line tips terminate over the right atrium.  Left-sided pacer in  place.  Patchy perihilar airspace opacities are stable.  IMPRESSION: Trace right apical pneumothorax with right-sided chest tube in place.  Stable perihilar airspace opacities and pleural effusions which could indicate pulmonary edema.   Original Report Authenticated By: Christiana Pellant, M.D.    Dg Chest Port 1 View  03/15/2012  *RADIOLOGY REPORT*  Clinical Data: Pneumothorax  PORTABLE CHEST - 1 VIEW  Comparison: Yesterday  Findings: Tiny right apical pneumothorax.  Right chest tube stable. Right internal jugular central venous catheter stable.  Left subclavian pacemaker device unchanged.  Cardiomegaly. Partial collapse of the right middle and lower lobes with shift of the mediastinum suspected.  Diffuse heterogeneous opacities have improved.  No left pneumothorax.  IMPRESSION: Tiny right apical pneumothorax.  Right chest tube in place.  Persistent collapse of the right lower lobe with shift of the mediastinum to the right.  Improved diffuse airspace opacities.   Original Report Authenticated By: Jolaine Click, M.D.     I have reviewed the patient's current medications.  Assessment/Plan: 1 CKD/AKI not a lot of response to Lasix and if not more neg will need HD and regularly and access 2 Anemia epo/fe  3 Copd 4 CM low bp 5 HTPH vit D 6 s/p pericard window per CVS 7 Pacer/afiv 8 Malnutition  P lasix, follow vol, epo, fe , save arm    LOS: 20 days   [name] 03/16/2012,9:32 AM

## 2012-03-16 NOTE — Progress Notes (Signed)
5 Days Post-Op Procedure(s) (LRB): SUBXYPHOID PERICARDIAL WINDOW (N/A) CHEST TUBE INSERTION (Right) Subjective: Patient stable after undergoing subxiphoid pericardial window and placement right chest tube for large pericardial and pleural effusions Cytologies and culture of fluid have been negative, fluid was transudate and clear related to heart failure and renal failure Pleural tube drainage decreased significantly since patient started on dialysis --we'll DC chest tube today      Objective: Vital signs in last 24 hours: Temp:  [97.7 F (36.5 C)-98.1 F (36.7 C)] 98.1 F (36.7 C) (12/25 0800) Pulse Rate:  [83-85] 83  (12/25 0800) Cardiac Rhythm:  [-] Ventricular paced (12/25 0800) Resp:  [17-20] 19  (12/25 0800) BP: (90-103)/(43-81) 102/61 mmHg (12/25 0800) SpO2:  [95 %-100 %] 95 % (12/25 0900) Weight:  [169 lb 15.6 oz (77.1 kg)] 169 lb 15.6 oz (77.1 kg) (12/25 0500)  Hemodynamic parameters for last 24 hours:  Stable  Intake/Output from previous day: 12/24 0701 - 12/25 0700 In: 483 [P.O.:480; I.V.:3] Out: 302 [Urine:302] Intake/Output this shift: Total I/O In: 150 [P.O.:150] Out: -   Lungs clear incision clean  Lab Results:  Basename 03/16/12 0500 03/15/12 0405  WBC 8.8 7.4  HGB 9.8* 9.5*  HCT 33.5* 31.6*  PLT 150 141*   BMET:  Basename 03/16/12 0500 03/15/12 0405  NA 127* 130*  K 5.3* 4.9  CL 91* 96  CO2 26 28  GLUCOSE 110* 112*  BUN 37* 24*  CREATININE 3.24* 2.36*  CALCIUM 8.6 8.2*    PT/INR: No results found for this basename: LABPROT,INR in the last 72 hours ABG    Component Value Date/Time   PHART 7.350 03/13/2012 0420   HCO3 23.4 03/13/2012 0420   TCO2 24.8 03/13/2012 0420   ACIDBASEDEF 1.4 03/13/2012 0420   O2SAT 89.2 03/13/2012 0420   CBG (last 3)   Basename 03/16/12 0748 03/16/12 0337 03/15/12 2332  GLUCAP 106* 126* 124*    Assessment/Plan: S/P Procedure(s) (LRB): SUBXYPHOID PERICARDIAL WINDOW (N/A) CHEST TUBE INSERTION (Right) DC  chest tube Continue medical care per medical team   LOS: 20 days    VAN TRIGT III,Ulices Maack 03/16/2012

## 2012-03-17 ENCOUNTER — Inpatient Hospital Stay (HOSPITAL_COMMUNITY): Payer: Medicare Other

## 2012-03-17 DIAGNOSIS — N186 End stage renal disease: Secondary | ICD-10-CM

## 2012-03-17 LAB — COMPREHENSIVE METABOLIC PANEL
Albumin: 2.1 g/dL — ABNORMAL LOW (ref 3.5–5.2)
BUN: 49 mg/dL — ABNORMAL HIGH (ref 6–23)
Calcium: 8.8 mg/dL (ref 8.4–10.5)
Creatinine, Ser: 3.89 mg/dL — ABNORMAL HIGH (ref 0.50–1.35)
Total Bilirubin: 0.6 mg/dL (ref 0.3–1.2)
Total Protein: 7.1 g/dL (ref 6.0–8.3)

## 2012-03-17 LAB — GLUCOSE, CAPILLARY
Glucose-Capillary: 121 mg/dL — ABNORMAL HIGH (ref 70–99)
Glucose-Capillary: 125 mg/dL — ABNORMAL HIGH (ref 70–99)

## 2012-03-17 LAB — CBC
HCT: 32.5 % — ABNORMAL LOW (ref 39.0–52.0)
MCH: 24.2 pg — ABNORMAL LOW (ref 26.0–34.0)
MCV: 81 fL (ref 78.0–100.0)
RDW: 20.4 % — ABNORMAL HIGH (ref 11.5–15.5)
WBC: 8.8 10*3/uL (ref 4.0–10.5)

## 2012-03-17 MED ORDER — SODIUM CHLORIDE 0.9 % IV SOLN: 100.0000 mL | INTRAVENOUS | Status: DC | PRN

## 2012-03-17 MED ORDER — LIDOCAINE-PRILOCAINE 2.5-2.5 % EX CREA: 1.0000 "application " | TOPICAL_CREAM | CUTANEOUS | Status: DC | PRN

## 2012-03-17 MED ORDER — HEPARIN SODIUM (PORCINE) 1000 UNIT/ML DIALYSIS
40.0000 [IU]/kg | Freq: Once | INTRAMUSCULAR | Status: DC
  Filled 2012-03-17: qty 4

## 2012-03-17 MED ORDER — CEFAZOLIN SODIUM 1-5 GM-% IV SOLN
1.0000 g | INTRAVENOUS | Status: AC
  Administered 2012-03-18: 1 g via INTRAVENOUS
  Filled 2012-03-17: qty 50

## 2012-03-17 MED ORDER — NEPRO/CARBSTEADY PO LIQD: 237.0000 mL | ORAL | Status: DC | PRN

## 2012-03-17 MED ORDER — DARBEPOETIN ALFA-POLYSORBATE 40 MCG/0.4ML IJ SOLN
40.0000 ug | INTRAMUSCULAR | Status: DC
  Administered 2012-03-19: 40 ug via INTRAVENOUS
  Filled 2012-03-17: qty 0.4

## 2012-03-17 MED ORDER — HEPARIN SODIUM (PORCINE) 1000 UNIT/ML DIALYSIS
1000.0000 [IU] | INTRAMUSCULAR | Status: DC | PRN
  Filled 2012-03-17: qty 1

## 2012-03-17 MED ORDER — LIDOCAINE HCL (PF) 1 % IJ SOLN: 5.0000 mL | INTRAMUSCULAR | Status: DC | PRN

## 2012-03-17 MED ORDER — PENTAFLUOROPROP-TETRAFLUOROETH EX AERO: 1.0000 "application " | INHALATION_SPRAY | CUTANEOUS | Status: DC | PRN

## 2012-03-17 MED ORDER — FUROSEMIDE 10 MG/ML IJ SOLN
80.0000 mg | Freq: Once | INTRAMUSCULAR | Status: AC
  Administered 2012-03-17: 80 mg via INTRAVENOUS
  Filled 2012-03-17: qty 8

## 2012-03-17 MED ORDER — ALTEPLASE 2 MG IJ SOLR
2.0000 mg | Freq: Once | INTRAMUSCULAR | Status: DC | PRN
  Filled 2012-03-17: qty 2

## 2012-03-17 MED ORDER — INSULIN ASPART 100 UNIT/ML ~~LOC~~ SOLN
0.0000 [IU] | SUBCUTANEOUS | Status: DC
  Administered 2012-03-17: 4 [IU] via SUBCUTANEOUS
  Administered 2012-03-18: 2 [IU] via SUBCUTANEOUS
  Administered 2012-03-19 (×2): 4 [IU] via SUBCUTANEOUS
  Administered 2012-03-20 – 2012-03-21 (×5): 2 [IU] via SUBCUTANEOUS
  Administered 2012-03-22: 4 [IU] via SUBCUTANEOUS
  Administered 2012-03-22: 8 [IU] via SUBCUTANEOUS

## 2012-03-17 NOTE — Consult Note (Signed)
[facility]  Patient name: [name] MRN: [id number] DOB: June 05, 1945 Sex: male  REASON FOR CONSULT: needs tunneled dialysis catheter in long-term hemodialysis access. Referred by Dr. Darrick Penna  HPI: [name] is a 66 y.o. male who was admitted on 02/26/2012 with shortness of breath and edema. He was felt to have decompensated systolic congestive heart failure. He has a history of chronic kidney disease and is currently being dialyzed with a temporary dialysis catheter in his right internal jugular vein. Vascular surgery was consult in order to convert that to a tunneled dialysis catheter and also evaluate him for long-term hemodialysis access. He states that he is right-handed. He states that his shortness of breath has improved significantly.  He does have a history of atrial fibrillation. I have reviewed his current medications and it does not appear that he is on Coumadin. He is 6 days status post subxiphoid pericardial window.  Past Medical History  Diagnosis Date  . ALCOHOL ABUSE, HX OF 04/30/2008  . 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 Atrial flutter 09/20/2006  . 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 Chronic systolic heart failure 09/20/2006    previous EF 25%;  Echocardiogram 12/10/10: Mild-moderate LVH, EF 50-55%, mild MR, moderate LAE, moderate RAE, mild RVE with moderately reduced RV SF, PASP 50, small pericardial effusion  . COPD 12/17/2006  . CORONARY ARTERY DISEASE 09/20/2006    s/p stent to OM;  catheterization 12/06: Mid to distal LAD 30%, D2 20%, proximal circumflex 30%, OM 2 stents patent, ostial RCA 50%, mid RCA 40%, distal RCA multiple 40%  . 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 DYSPEPSIA&OTHER [facility] DISORDERS FUNCTION STOMACH 04/04/2009  . HYPERTENSION 12/17/2006  . OBESITY 04/30/2008  . HLD (hyperlipidemia) 09/20/2006  . OTITIS EXTERNA,  CHRONIC NEC 12/28/2006  . PERSONAL HX COLONIC POLYPS 05/02/2009  . PSORIASIS 04/30/2008  . CKD (chronic kidney disease) 04/30/2008    creatinine 2.2 range    Family History  Problem Relation Age of Onset  . Lung cancer Mother     and aunt  . Heart attack Father   . Diabetes Sister     and aunt  . Colon cancer Neg Hx     SOCIAL HISTORY: History  Substance Use Topics  . Smoking status: Former Smoker -- 1.0 packs/day for 50 years    Types: Cigarettes    Quit date: 03/23/2005  . Smokeless tobacco: Never Used  . Alcohol Use: No     Comment: quit drinking 2007    Allergies  Allergen Reactions  . Aspirin     REACTION: Swelling, trouble breathing  . Tetracycline Hcl     REACTION: Swelling, trouble breathing    Current Facility-Administered Medications  Medication Dose Route Frequency Provider Last Rate Last Dose  . albuterol (PROVENTIL HFA;VENTOLIN HFA) 108 (90 BASE) MCG/ACT inhaler 2 puff  2 puff Inhalation Q6H PRN Eduard Clos, MD   2 puff at 03/06/12 1036  . budesonide-formoterol (SYMBICORT) 160-4.5 MCG/ACT inhaler 2 puff  2 puff Inhalation BID Eduard Clos, MD   2 puff at 03/17/12 1038  . calcium acetate (PHOSLO) capsule 667 mg  667 mg Oral TID WC Sadie Haber, MD   667 mg at 03/17/12 0806  . darbepoetin (ARANESP) injection 40 mcg  40 mcg Intravenous Q Fri-HD Calvert Cantor, MD      . diphenhydrAMINE-zinc acetate (BENADRYL) 2-0.1 %  cream   Topical TID PRN Maryruth Bun Rama, MD   1 application at 02/27/12 1718  . doxercalciferol (HECTOROL) injection 1 mcg  1 mcg Intravenous Q M,W,F-HD Sadie Haber, MD   1 mcg at 12-Mar-2012 [phone]  . feeding supplement (RESOURCE BREEZE) liquid 1 Container  1 Container Oral Q24H Haynes Bast, RD   1 Container at 03/15/12 1129  . fentaNYL (SUBLIMAZE) injection 25-50 mcg  25-50 mcg Intravenous Q2H PRN Ardelle Balls, PA   25 mcg at 03/13/12 1120  . gabapentin (NEURONTIN) capsule 100 mg  100 mg Oral TID Eduard Clos, MD    100 mg at 03/15/12 1100  . guaiFENesin tablet 200 mg  200 mg Oral Q6H PRN Belkys A Regalado, MD   200 mg at 03/06/12 2156  . insulin aspart (novoLOG) injection 0-24 Units  0-24 Units Subcutaneous Q4H Ardelle Balls, PA   2 Units at 03/16/12 2355  . metoprolol succinate (TOPROL-XL) 24 hr tablet 25 mg  25 mg Oral QHS Ardelle Balls, PA   25 mg at 03/14/12 2223  . multivitamin (RENA-VIT) tablet 1 tablet  1 tablet Oral QHS Trevor Iha, MD   1 tablet at 03/15/12 2339  . oxyCODONE-acetaminophen (PERCOCET/ROXICET) 5-325 MG per tablet 1-2 tablet  1-2 tablet Oral Q4H PRN Ardelle Balls, PA   2 tablet at 03/15/12 1144  . pantoprazole (PROTONIX) EC tablet 40 mg  40 mg Oral Daily Eduard Clos, MD   40 mg at 03/16/12 1191  . polyethylene glycol (MIRALAX / GLYCOLAX) packet 17 g  17 g Oral Daily Belkys A Regalado, MD   17 g at 03/16/12 0926  . senna-docusate (Senokot-S) tablet 1 tablet  1 tablet Oral BID Alba Cory, MD   1 tablet at 03/16/12 0928  . senna-docusate (Senokot-S) tablet 1 tablet  1 tablet Oral QHS PRN Ardelle Balls, PA      . simvastatin (ZOCOR) tablet 40 mg  40 mg Oral QHS Eduard Clos, MD   40 mg at 03/15/12 2339  . sodium chloride 0.9 % injection 3 mL  3 mL Intravenous Q12H Eduard Clos, MD   3 mL at 03/16/12 2200  . Tamsulosin HCl (FLOMAX) capsule 0.4 mg  0.4 mg Oral QPC supper Maryruth Bun Rama, MD   0.4 mg at 03/16/12 1757  . tiotropium (SPIRIVA) inhalation capsule 18 mcg  18 mcg Inhalation Daily Eduard Clos, MD   18 mcg at 03/17/12 1038  . traMADol (ULTRAM) tablet 50 mg  50 mg Oral Q6H PRN Kerin Perna, MD   50 mg at 03/15/12 0417    REVIEW OF SYSTEMS: Arly.Keller ] denotes positive finding; [  ] denotes negative finding CARDIOVASCULAR:  Arly.Keller ] chest pain   2 days ago  [ ]  chest pressure   [ ]  palpitations   [ ]  orthopnea   Arly.Keller ] dyspnea on exertion    PULMONARY:   [ ]  productive cough   [ ]  asthma   [ ]  wheezing NEUROLOGIC:   [ ]   weakness  [ ]  paresthesias  [ ]  aphasia  [ ]  amaurosis  [ ]  dizziness HEMATOLOGIC:   [ ]  bleeding problems   [ ]  clotting disorders MUSCULOSKELETAL:  [ ]  joint pain   [ ]  joint swelling [ ]  leg swelling GASTROINTESTINAL: [ ]   blood in stool  [ ]   hematemesis GENITOURINARY:  [ ]   dysuria  [ ]   hematuria PSYCHIATRIC:  [ ]   history of major depression INTEGUMENTARY:  [ ]  rashes  [ ]  ulcers CONSTITUTIONAL:  [ ]  fever   [ ]  chills  PHYSICAL EXAM: Filed Vitals:   03/17/12 0000 03/17/12 0348 03/17/12 0349 03/17/12 0800  BP: 109/58 111/65  114/67  Pulse: 81 87  84  Temp: 98.5 F (36.9 C) 98.2 F (36.8 C)  97.8 F (36.6 C)  TempSrc: Oral Oral  Oral  Resp: 11 19  21   Height:      Weight:   168 lb 10.4 oz (76.5 kg)   SpO2: 96% 88%  96%   Body mass index is 28.07 kg/(m^2). GENERAL: The patient is a well-nourished male, in no acute distress. The vital signs are documented above. CARDIOVASCULAR: There is a regular rate and rhythm. He has a palpable radial and brachial pulses bilaterally. He has significant bilateral lower extremity edema. PULMONARY: There is good air exchange bilaterally without wheezing or rales. ABDOMEN: Soft and non-tender with normal pitched bowel sounds.  MUSCULOSKELETAL: There are no major deformities or cyanosis. NEUROLOGIC: No focal weakness or paresthesias are detected. SKIN: he has hyperpigmentation bilaterally consistent with chronic venous insufficiency. PSYCHIATRIC: The patient has a normal affect. He does appear to have a reasonable forearm and upper arm cephalic vein in the left arm.  DATA:  Lab Results  Component Value Date   WBC 8.8 03/17/2012   HGB 9.7* 03/17/2012   HCT 32.5* 03/17/2012   MCV 81.0 03/17/2012   PLT 154 03/17/2012   Lab Results  Component Value Date   NA 128* 03/17/2012   K 5.7* 03/17/2012   CL 91* 03/17/2012   CO2 26 03/17/2012   Lab Results  Component Value Date   CREATININE 3.89* 03/17/2012   Lab Results  Component Value  Date   INR 1.49 03/10/2012   INR 1.71* 03/09/2012   INR 1.63* 03/08/2012   Lab Results  Component Value Date   HGBA1C 8.4* 01/08/2012   CBG (last 3)   Basename 03/17/12 0720 03/17/12 0348 03/16/12 2347  GLUCAP 99 100* 125*   It does not appear that he has had vein mapping.  MEDICAL ISSUES: The patient appears to be a good candidate for a left radial cephalic or brachial cephalic fistula. I will try to obtain a vein mapping of the left arm today. We will tentatively plan placement of a tunneled dialysis catheter and a left upper extremity AV fistula tomorrow afternoon. I have reviewed the procedure potential complications with the patient including but not limited to bleeding, wound healing problems, failure of the fistula to mature, need for further interventions to improve performance of the fistula. All of his questions were answered and he is agreeable to proceed.  Trip Cavanagh S [facility] [place] [facility]: [phone]

## 2012-03-17 NOTE — Progress Notes (Signed)
[facility] PROGRESS NOTE  LONEY DOMINGO ZOX:[id number] DOB: 1945-07-16 DOA: 03/02/2012 PCP: [name], MD  Brief narrative:  66 year old presents with SOB and lower extremities edema. He was diagnosed with Acute on chronic HF exacerbation. He was found to have pleural effusion and has moderate pericardial effusion with no physiologic tamponade. He develop acute on chronic renal failure. He is now requiring dialysis.   Assessment/Plan:  Acute on chronic combined systolic and diastolic CHF (congestive heart failure) / right pleural effusion  -EF 50 to 55 % from echo on 12/10. Repeated on 12/18 and shows mild to moderately reduced EF of 40-45 %  -chest x-ray 02/29/2012 showed an enlarging effusion. thoracentesis with 1.8 L of fluid removed on 12/10. culture shows no growth. Cytology negative for malignant cells. A repeat chest CT on 12/17 still shows moderate to large size rt sided pleural effusion.  -s/p right thoracentesis done with chest tube placed as well on 12/20 -S/p pericardiocentesis done 12/20 with drain placed. Removed on 12/21. -Patient required a vent overnight on 12/20 after the procedure  Pericardial effusion  -No obvious tamponade physiology. Coumadin has been on hold for pericardial window . We will discuss with cardiology on resuming his Coumadin. -Appreciate CTVS and recommendations  Acute on chronic CKD  -secondary to Heart failure exacerbation, hemodynamically mediated injury and evolution of ATN. Cr baseline 1.6 to 1.9 prior records.  -Temporary HD cath placed 03/07/12 . Patient now needing permanent dialysis. - vein mapping scheduled -per renal pt can transfer to renal floor  -Urinary retention  -Patient had 708 cc in bladder on bladder scan 12/10. Foley catheter placed. No hydronephrosis on renal ultrasound.  -Continue with Flomax.  -Will need to make sure patient able to afford medications prior to discharge.  -making small amount of urine due  to renal failure -Can likely do voiding trial prior to d/c home -Neosporin cream to catheter area for irritation.   Microcytic anemia  consistent with Anemia of chronic disease  Hb stable   Hypokalemia / hyperkalemia  Resolved   Hyponatremia  adjusting with HD   Skin wounds  Seen by wound care nurse 02/28/2012. Benadryl cream when necessary for pruritus. Eucerin cream for maintenance of moisturization.   DM Type II / hypoglycemia  Lantus discontinued on 02/28/2012 secondary to hypoglycemia. His sugars are stable- not receiving much insulin  A fib  Status post AV nodal ablation. Has pacemaker. Rate controlled, Coumadin on hold For planned pericardiocentesis for 12/20. Cardio recommend holding anticoagulation for at least 1 week.  -His rate is controlled on metoprolol.    COPD  Stable. Continue Symbicort and Spiriva.   E. Coli UTI (lower urinary tract infection)  Patient completed antibiotic regimen with rocephin    Code Status: Full.  Disposition Plan:pending PT eval.   Consultants:  Cardiology, Dr. Eden Emms.  renal  CTS ( Dr Morton Peters)  Procedures:  Thoracentesis 12/10.  Pericardiocentesis and rt thoracentessis on 12/20    Antibiotics:  Rocephin 02/26/2012--->02/29/12  cipro 12/9 zinacef 12/19->12/20  Fortaz on 12/22 for 4 doses    HPI/Subjective:  He is laying in bed- sleepy but easily awakened- very oriented and able to tell me about having dialysis today and the plan for permanent access which he describes at putting something in his veins. He has no complaints- ambulated in the hall today.    Objective: Filed Vitals:   03/17/12 0349 03/17/12 0800 03/17/12 1039 03/17/12 1200  BP:  114/67  111/72  Pulse:  84  85  Temp:  97.8 F (36.6 C)  98.1 F (36.7 C)  TempSrc:  Oral  Oral  Resp:  21  18  Height:      Weight: 76.5 kg (168 lb 10.4 oz)     SpO2:  96% 96% 93%    Intake/Output Summary (Last 24 hours) at 03/17/12 1545 Last data filed at 03/17/12  1200  Gross per 24 hour  Intake    800 ml  Output    350 ml  Net    450 ml   Filed Weights   03/15/12 0500 03/16/12 0500 03/17/12 0349  Weight: 76 kg (167 lb 8.8 oz) 77.1 kg (169 lb 15.6 oz) 76.5 kg (168 lb 10.4 oz)    Exam:   General:  Elderly thin built male in no acute distress  HEENT: No pallor, moist oral mucosa, right IJ  Cardiovascular: Normal S1 and S2 no murmurs  Respiratory: Diminished breath sounds over right side. No rhonchi or wheeze  Abdomen: Soft, nontender, bowel sounds present, Foley in place  Extremities: Warm, b/l pitting edema  CNS: AAO X 3   Data Reviewed: Basic Metabolic Panel:  Lab 03/17/12 4540 03/16/12 0500 03/15/12 0405 03/14/12 0345 03/13/12 0356  NA 128* 127* 130* 121* 129*  K 5.7* 5.3* 4.9 5.1 4.6  CL 91* 91* 96 88* 95*  CO2 26 26 28 24 23   GLUCOSE 105* 110* 112* 59* 78  BUN 49* 37* 24* 41* 33*  CREATININE 3.89* 3.24* 2.36* 3.08* 2.45*  CALCIUM 8.8 8.6 8.2* 8.2* 8.3*  MG -- -- -- -- --  PHOS 5.8* 5.6* 3.7 5.6* 3.9   Liver Function Tests:  Lab 03/17/12 0410 03/16/12 0500 03/15/12 0405 03/14/12 0345 03/13/12 0356 03/10/12 1805  AST 25 25 20  -- 16 21  ALT 14 14 14  -- 11 13  ALKPHOS 137* 120* 95 -- 69 81  BILITOT 0.6 0.5 0.5 -- 0.6 0.5  PROT 7.1 7.0 6.5 -- 6.5 7.6  ALBUMIN 2.1* 2.2* 2.1* 2.2* 2.2* --   No results found for this basename: LIPASE:5,AMYLASE:5 in the last 168 hours No results found for this basename: AMMONIA:5 in the last 168 hours CBC:  Lab 03/17/12 0410 03/16/12 0500 03/15/12 0405 03/14/12 0345 03/13/12 0356  WBC 8.8 8.8 7.4 8.4 8.9  NEUTROABS -- -- -- -- --  HGB 9.7* 9.8* 9.5* 10.5* 10.4*  HCT 32.5* 33.5* 31.6* 35.4* 34.0*  MCV 81.0 83.5 81.4 80.8 78.7  PLT 154 150 141* 180 180   Cardiac Enzymes:  Lab 03/14/12 0525  CKTOTAL 40  CKMB 5.5*  CKMBINDEX --  TROPONINI <0.30   BNP (last 3 results)  Basename 02/29/12 0505 02/26/12 0105 04/07/11 0934  PROBNP 8263.0* 8685.0* 205.0*   CBG:  Lab 03/17/12  1126 03/17/12 0720 03/17/12 0348 03/16/12 2347 03/16/12 1946  GLUCAP 121* 99 100* 125* 127*    Recent Results (from the past 240 hour(s))  URINE CULTURE     Status: Normal   Collection Time   03/09/12  6:14 AM      Component Value Range Status Comment   Specimen Description URINE, CLEAN CATCH   Final    Special Requests NONE   Final    Culture  Setup Time 03/09/2012 15:19   Final    Colony Count NO GROWTH   Final    Culture NO GROWTH   Final    Report Status 03/10/2012 FINAL   Final   URINE CULTURE     Status: Normal  Collection Time   03/21/2012  1:56 AM      Component Value Range Status Comment   Specimen Description URINE, CATHETERIZED   Final    Special Requests CX ADDED AT 0242 ON [id number]   Final    Culture  Setup Time 03/13/2012 02:49   Final    Colony Count NO GROWTH   Final    Culture NO GROWTH   Final    Report Status 03/12/2012 FINAL   Final   SURGICAL PCR SCREEN     Status: Abnormal   Collection Time   03/17/2012  5:16 AM      Component Value Range Status Comment   MRSA, PCR POSITIVE (*) NEGATIVE Final    Staphylococcus aureus POSITIVE (*) NEGATIVE Final   TISSUE CULTURE     Status: Normal   Collection Time   02/21/2012  4:43 PM      Component Value Range Status Comment   Specimen Description TISSUE PERICARDIUM   Final    Special Requests NONE   Final    Gram Stain     Final    Value: NO WBC SEEN     NO ORGANISMS SEEN   Culture NO GROWTH 3 DAYS   Final    Report Status 03/15/2012 FINAL   Final   BODY FLUID CULTURE     Status: Normal   Collection Time   03/15/2012  4:48 PM      Component Value Range Status Comment   Specimen Description FLUID PERICARDIAL   Final    Special Requests NONE   Final    Gram Stain     Final    Value: NO WBC SEEN     NO ORGANISMS SEEN   Culture NO GROWTH 3 DAYS   Final    Report Status 03/15/2012 FINAL   Final   BODY FLUID CULTURE     Status: Normal   Collection Time   03/20/2012  4:50 PM      Component Value Range Status Comment    Specimen Description FLUID RIGHT PLEURAL   Final    Special Requests NONE   Final    Gram Stain     Final    Value: RARE WBC PRESENT, PREDOMINANTLY MONONUCLEAR     NO ORGANISMS SEEN   Culture NO GROWTH 3 DAYS   Final    Report Status 03/15/2012 FINAL   Final      Studies: Dg Chest 2 View  03/17/2012  *RADIOLOGY REPORT*  Clinical Data: Shortness of breath.  CHEST - 2 VIEW  Comparison: 03/16/2012  Findings: The right chest tube has been removed. There is no definite right pneumothorax.  Patchy right basilar lung densities. Heart size remains enlarged.  Dialysis catheter tip near the SVC/right atrium junction.   Again noted is a cardiac pacemaker. Decreased interstitial densities suggest decreased edema. Persistent densities in the azygos lobe.  Impression:  Removal of the right chest tube without a definite pneumothorax.  Decreasing pulmonary edema.  Patchy densities at the right lung base are probably associated with volume loss.   Original Report Authenticated By: Richarda Overlie, M.D.    Dg Chest Port 1 View  03/16/2012  *RADIOLOGY REPORT*  Clinical Data: Chest tube placement  PORTABLE CHEST - 1 VIEW  Comparison: 03/15/2012  Findings: Moderate to marked enlargement of the cardial mediastinal silhouette is again noted.  Right-sided chest tube remains in place with trace remaining right pleural effusion.  Trace left pleural effusion is noted.  Stable trace  right apical pneumothorax.  Dual lumen right IJ approach central line tips terminate over the right atrium.  Left-sided pacer in place.  Patchy perihilar airspace opacities are stable.  IMPRESSION: Trace right apical pneumothorax with right-sided chest tube in place.  Stable perihilar airspace opacities and pleural effusions which could indicate pulmonary edema.   Original Report Authenticated By: Christiana Pellant, M.D.     Scheduled Meds:    . budesonide-formoterol  2 puff Inhalation BID  . calcium acetate  667 mg Oral TID WC  .  ceFAZolin (ANCEF)  IV  1 g Intravenous On Call  . darbepoetin (ARANESP) injection - DIALYSIS  40 mcg Intravenous Q Fri-HD  . doxercalciferol  1 mcg Intravenous Q M,W,F-HD  . feeding supplement  1 Container Oral Q24H  . gabapentin  100 mg Oral TID  . insulin aspart  0-24 Units Subcutaneous Q4H  . metoprolol succinate  25 mg Oral QHS  . multivitamin  1 tablet Oral QHS  . pantoprazole  40 mg Oral Daily  . polyethylene glycol  17 g Oral Daily  . senna-docusate  1 tablet Oral BID  . simvastatin  40 mg Oral QHS  . sodium chloride  3 mL Intravenous Q12H  . Tamsulosin HCl  0.4 mg Oral QPC supper  . tiotropium  18 mcg Inhalation Daily   Continuous Infusions:     Time spent: 30 minutes    [facility]  [facility] Pager [phone]. If 8PM-8AM, please contact night-coverage at [email or web address], password [facility] 03/17/2012, 3:45 PM  LOS: 21 days

## 2012-03-17 NOTE — Progress Notes (Signed)
Subjective: Interval History: none.  Objective: Vital signs in last 24 hours: Temp:  [97.8 F (36.6 C)-99 F (37.2 C)] 97.8 F (36.6 C) (12/26 0800) Pulse Rate:  [81-87] 84  (12/26 0800) Resp:  [11-21] 21  (12/26 0800) BP: (106-115)/(58-67) 114/67 mmHg (12/26 0800) SpO2:  [88 %-98 %] 96 % (12/26 0800) Weight:  [76.5 kg (168 lb 10.4 oz)] 76.5 kg (168 lb 10.4 oz) (12/26 0349) Weight change: -0.6 kg (-1 lb 5.2 oz)  Intake/Output from previous day: 12/25 0701 - 12/26 0700 In: 700 [P.O.:700] Out: 175 [Urine:175] Intake/Output this shift: Total I/O In: 250 [P.O.:250] Out: 125 [Urine:125]  General appearance: alert, pale and slowed mentation Neck: RIJ cath Resp: diminished breath sounds bilaterally, rales LLL, RLL and RML, rhonchi RLL, RML and RUL and R>[name] Cardio: S1, S2 normal and systolic murmur: holosystolic 2/6, blowing at lower left sternal border GI: distentded. pos bs, liver down 4 cm Extremities: edema 3+  Lab Results:  Basename 03/17/12 0410 03/16/12 0500  WBC 8.8 8.8  HGB 9.7* 9.8*  HCT 32.5* 33.5*  PLT 154 150   BMET:  Basename 03/17/12 0410 03/16/12 0500  NA 128* 127*  K 5.7* 5.3*  CL 91* 91*  CO2 26 26  GLUCOSE 105* 110*  BUN 49* 37*  CREATININE 3.89* 3.24*  CALCIUM 8.8 8.6   No results found for this basename: PTH:2 in the last 72 hours Iron Studies: No results found for this basename: IRON,TIBC,TRANSFERRIN,FERRITIN in the last 72 hours  Studies/Results: Dg Chest 2 View  03/17/2012  *RADIOLOGY REPORT*  Clinical Data: Shortness of breath.  CHEST - 2 VIEW  Comparison: 03/16/2012  Findings: The right chest tube has been removed. There is no definite right pneumothorax.  Patchy right basilar lung densities. Heart size remains enlarged.  Dialysis catheter tip near the SVC/right atrium junction.   Again noted is a cardiac pacemaker. Decreased interstitial densities suggest decreased edema. Persistent densities in the azygos lobe.  Impression:  Removal of the  right chest tube without a definite pneumothorax.  Decreasing pulmonary edema.  Patchy densities at the right lung base are probably associated with volume loss.   Original Report Authenticated By: Richarda Overlie, M.D.    Dg Chest Port 1 View  03/16/2012  *RADIOLOGY REPORT*  Clinical Data: Chest tube placement  PORTABLE CHEST - 1 VIEW  Comparison: 03/15/2012  Findings: Moderate to marked enlargement of the cardial mediastinal silhouette is again noted.  Right-sided chest tube remains in place with trace remaining right pleural effusion.  Trace left pleural effusion is noted.  Stable trace right apical pneumothorax.  Dual lumen right IJ approach central line tips terminate over the right atrium.  Left-sided pacer in place.  Patchy perihilar airspace opacities are stable.  IMPRESSION: Trace right apical pneumothorax with right-sided chest tube in place.  Stable perihilar airspace opacities and pleural effusions which could indicate pulmonary edema.   Original Report Authenticated By: Christiana Pellant, M.D.     I have reviewed the patient's current medications.  Assessment/Plan: 1 CKD/AKI no recovery and not responding to lasix, will plan cont HD on reg basis.  Get access 2 Anemia epo/fe 3 Hpth Vit D 4 CM with R HF ascites, try to lessen 5 Pericard effusion s/p window per [facility] and cards 6 Malnutrition. suppl 7 COPD severe P HD qd, access, epo/fe, vit D stop Lasix    LOS: 21 days   [name] 03/17/2012,9:04 AM

## 2012-03-17 NOTE — Progress Notes (Signed)
Pt lethargic and unable to stay awake to take medicine. NP Schorr notified. I was given instructions to hold all the PO meds. NP Schorr placed new order for IV lasix. IV lasix given. Will continue to monitor.  [place], [place], Charity fundraiser

## 2012-03-17 NOTE — Progress Notes (Addendum)
                    [address]            [place] [address]          [phone]     6 Days Post-Op Procedure(s) (LRB): SUBXYPHOID PERICARDIAL WINDOW (N/A) CHEST TUBE INSERTION (Right)  Subjective: Feels well.  Breathing stable.  Some productive cough. Still gets SOB with increased activity, but improving.   Objective: Vital signs in last 24 hours: Patient Vitals for the past 24 hrs:  BP Temp Temp src Pulse Resp SpO2 Weight  03/17/12 0349 - - - - - - 168 lb 10.4 oz (76.5 kg)  03/17/12 0348 111/65 mmHg 98.2 F (36.8 C) Oral 87  19  88 % -  03/17/12 0000 109/58 mmHg 98.5 F (36.9 C) Oral 81  11  96 % -  03/16/12 1936 110/58 mmHg 99 F (37.2 C) Oral 87  15  97 % -  03/16/12 1600 106/61 mmHg 98 F (36.7 C) Oral 84  16  98 % -  03/16/12 1200 115/63 mmHg 98.2 F (36.8 C) Oral 85  18  98 % -  03/16/12 0900 - - - - - 95 % -   Current Weight  03/17/12 168 lb 10.4 oz (76.5 kg)     Intake/Output from previous day: 12/25 0701 - 12/26 0700 In: 700 [P.O.:700] Out: 175 [Urine:175]    PHYSICAL EXAM:  Heart: RRR Lungs: coarse rhonchi bilaterally Wound: Clean and dry     Lab Results: CBC: Basename 03/17/12 0410 03/16/12 0500  WBC 8.8 8.8  HGB 9.7* 9.8*  HCT 32.5* 33.5*  PLT 154 150   BMET:  Basename 03/17/12 0410 03/16/12 0500  NA 128* 127*  K 5.7* 5.3*  CL 91* 91*  CO2 26 26  GLUCOSE 105* 110*  BUN 49* 37*  CREATININE 3.89* 3.24*  CALCIUM 8.8 8.6    PT/INR: No results found for this basename: LABPROT,INR in the last 72 hours  CXR: Findings: The right chest tube has been removed. There is no  definite right pneumothorax. Patchy right basilar lung densities.  Heart size remains enlarged. Dialysis catheter tip near the  SVC/right atrium junction. Again noted is a cardiac pacemaker.  Decreased interstitial densities suggest decreased edema.  Persistent densities in the azygos lobe.  Impression: Removal of the right chest tube without a  definite  pneumothorax.  Decreasing pulmonary edema.  Patchy densities at the right lung base are probably associated   Assessment/Plan: S/P Procedure(s) (LRB): SUBXYPHOID PERICARDIAL WINDOW (N/A) CHEST TUBE INSERTION (Right) Stable from surgical standpoint. Continue care per primary service.   LOS: 21 days    COLLINS,GINA H 03/17/2012   patient examined and medical record reviewed,agree with above note. VAN TRIGT III,PETER 03/17/2012

## 2012-03-17 NOTE — Procedures (Signed)
I was present at this session.  I have reviewed the session itself and made appropriate changes.  Hd via temp HD cath R IJ  Annaleia Pence L 12/26/20137:29 PM

## 2012-03-17 NOTE — Progress Notes (Signed)
1740 patient arrived via w/c from 3300.  Patient alert and oriented to place and situation, unit routines and contact isolation.  Noted that patient is for HD tonight and patient made aware - dinner tray given prior to going to hd.  Patient denied c/o's

## 2012-03-17 NOTE — Progress Notes (Signed)
Patient transferred to 6710.  Report called to RN on 6700 and all questions answered. [name] Bonita Quin called and message left with new room assignment.  Patient transferred on 2L [place] in wheelchair with NT.

## 2012-03-18 ENCOUNTER — Encounter (HOSPITAL_COMMUNITY): Payer: Self-pay | Admitting: Certified Registered"

## 2012-03-18 ENCOUNTER — Encounter (HOSPITAL_COMMUNITY): Admission: EM | Disposition: E | Payer: Self-pay | Source: Home / Self Care | Attending: Pulmonary Disease

## 2012-03-18 ENCOUNTER — Encounter (HOSPITAL_COMMUNITY): Payer: Self-pay | Admitting: *Deleted

## 2012-03-18 ENCOUNTER — Inpatient Hospital Stay (HOSPITAL_COMMUNITY): Payer: Medicare Other | Admitting: Certified Registered"

## 2012-03-18 ENCOUNTER — Inpatient Hospital Stay (HOSPITAL_COMMUNITY): Payer: Medicare Other

## 2012-03-18 DIAGNOSIS — N19 Unspecified kidney failure: Secondary | ICD-10-CM

## 2012-03-18 HISTORY — PX: AV FISTULA PLACEMENT: SHX1204

## 2012-03-18 HISTORY — PX: REMOVAL OF A DIALYSIS CATHETER: SHX6053

## 2012-03-18 HISTORY — PX: INSERTION OF DIALYSIS CATHETER: SHX1324

## 2012-03-18 LAB — GLUCOSE, CAPILLARY
Glucose-Capillary: 103 mg/dL — ABNORMAL HIGH (ref 70–99)
Glucose-Capillary: 134 mg/dL — ABNORMAL HIGH (ref 70–99)
Glucose-Capillary: 95 mg/dL (ref 70–99)
Glucose-Capillary: 127 mg/dL — ABNORMAL HIGH (ref 70–99)

## 2012-03-18 LAB — CBC
MCHC: 30.6 g/dL (ref 30.0–36.0)
Platelets: 145 10*3/uL — ABNORMAL LOW (ref 150–400)
RDW: 20.7 % — ABNORMAL HIGH (ref 11.5–15.5)
WBC: 7.6 10*3/uL (ref 4.0–10.5)

## 2012-03-18 LAB — RENAL FUNCTION PANEL
Albumin: 2 g/dL — ABNORMAL LOW (ref 3.5–5.2)
BUN: 28 mg/dL — ABNORMAL HIGH (ref 6–23)
Chloride: 94 mEq/L — ABNORMAL LOW (ref 96–112)
GFR calc non Af Amer: 26 mL/min — ABNORMAL LOW (ref >90)
Phosphorus: 3.8 mg/dL (ref 2.3–4.6)
Potassium: 4.2 mEq/L (ref 3.5–5.1)
Sodium: 131 mEq/L — ABNORMAL LOW (ref 135–145)

## 2012-03-18 LAB — PROTIME-INR
INR: 1.63 — ABNORMAL HIGH (ref 0.00–1.49)
Prothrombin Time: 18.8 seconds — ABNORMAL HIGH (ref 11.6–15.2)

## 2012-03-18 SURGERY — REMOVAL, DIALYSIS CATHETER
Anesthesia: Monitor Anesthesia Care | Site: Neck | Laterality: Right | Wound class: Clean

## 2012-03-18 MED ORDER — LIDOCAINE-EPINEPHRINE (PF) 1 %-1:200000 IJ SOLN
INTRAMUSCULAR | Status: AC
  Filled 2012-03-18: qty 10

## 2012-03-18 MED ORDER — 0.9 % SODIUM CHLORIDE (POUR BTL) OPTIME
TOPICAL | Status: DC | PRN
  Administered 2012-03-18: 1000 mL

## 2012-03-18 MED ORDER — MIDAZOLAM HCL 5 MG/5ML IJ SOLN
INTRAMUSCULAR | Status: DC | PRN
  Administered 2012-03-18: 0.5 mg via INTRAVENOUS
  Administered 2012-03-18: 1 mg via INTRAVENOUS
  Administered 2012-03-18: .5 mg via INTRAVENOUS

## 2012-03-18 MED ORDER — HEPARIN SODIUM (PORCINE) 1000 UNIT/ML IJ SOLN
INTRAMUSCULAR | Status: DC | PRN
  Administered 2012-03-18: 4.6 mL

## 2012-03-18 MED ORDER — LIDOCAINE HCL (PF) 1 % IJ SOLN
INTRAMUSCULAR | Status: AC
  Filled 2012-03-18: qty 30

## 2012-03-18 MED ORDER — SODIUM CHLORIDE 0.9 % IR SOLN
Status: DC | PRN
  Administered 2012-03-18: 14:00:00

## 2012-03-18 MED ORDER — SODIUM CHLORIDE 0.9 % IV SOLN
INTRAVENOUS | Status: DC | PRN
  Administered 2012-03-18: 14:00:00 via INTRAVENOUS

## 2012-03-18 MED ORDER — THROMBIN 20000 UNITS EX SOLR
CUTANEOUS | Status: AC
  Filled 2012-03-18: qty 20000

## 2012-03-18 MED ORDER — HEPARIN SODIUM (PORCINE) 1000 UNIT/ML IJ SOLN
INTRAMUSCULAR | Status: DC | PRN
  Administered 2012-03-18: 5000 [IU] via INTRAVENOUS

## 2012-03-18 MED ORDER — HEPARIN SODIUM (PORCINE) 1000 UNIT/ML IJ SOLN
INTRAMUSCULAR | Status: AC
  Filled 2012-03-18: qty 1

## 2012-03-18 MED ORDER — FENTANYL CITRATE 0.05 MG/ML IJ SOLN
INTRAMUSCULAR | Status: DC | PRN
  Administered 2012-03-18 (×2): 25 ug via INTRAVENOUS

## 2012-03-18 MED ORDER — SODIUM CHLORIDE 0.9 % IV SOLN
INTRAVENOUS | Status: DC
  Administered 2012-03-18: 14:00:00 via INTRAVENOUS
  Administered 2012-03-19: 250 mL via INTRAVENOUS
  Administered 2012-03-21 – 2012-03-26 (×3): via INTRAVENOUS

## 2012-03-18 MED ORDER — LIDOCAINE HCL (PF) 1 % IJ SOLN
INTRAMUSCULAR | Status: DC | PRN
  Administered 2012-03-18: 7 mL

## 2012-03-18 MED ORDER — CEFAZOLIN SODIUM 1-5 GM-% IV SOLN
INTRAVENOUS | Status: AC
  Filled 2012-03-18: qty 50

## 2012-03-18 MED ORDER — CEFAZOLIN SODIUM 1-5 GM-% IV SOLN
INTRAVENOUS | Status: DC | PRN
  Administered 2012-03-18: 1 g via INTRAVENOUS

## 2012-03-18 SURGICAL SUPPLY — 67 items
ADH SKN CLS APL DERMABOND .7 (GAUZE/BANDAGES/DRESSINGS) ×4
BAG DECANTER FOR FLEXI CONT (MISCELLANEOUS) ×3 IMPLANT
BAG URINE DRAINAGE (UROLOGICAL SUPPLIES) ×1 IMPLANT
CANISTER SUCTION 2500CC (MISCELLANEOUS) ×3 IMPLANT
CATH CANNON HEMO 15F 50CM (CATHETERS) IMPLANT
CATH CANNON HEMO 15FR 19 (HEMODIALYSIS SUPPLIES) IMPLANT
CATH CANNON HEMO 15FR 23CM (HEMODIALYSIS SUPPLIES) IMPLANT
CATH CANNON HEMO 15FR 31CM (HEMODIALYSIS SUPPLIES) IMPLANT
CATH CANNON HEMO 15FR 32 (HEMODIALYSIS SUPPLIES) IMPLANT
CATH CANNON HEMO 15FR 32CM (HEMODIALYSIS SUPPLIES) IMPLANT
CHLORAPREP W/TINT 26ML (MISCELLANEOUS) ×3 IMPLANT
CLIP TI MEDIUM 6 (CLIP) ×3 IMPLANT
CLIP TI WIDE RED SMALL 6 (CLIP) ×4 IMPLANT
CLOTH BEACON ORANGE TIMEOUT ST (SAFETY) ×4 IMPLANT
COVER PROBE W GEL 5X96 (DRAPES) ×3 IMPLANT
COVER SURGICAL LIGHT HANDLE (MISCELLANEOUS) ×3 IMPLANT
DECANTER SPIKE VIAL GLASS SM (MISCELLANEOUS) ×3 IMPLANT
DERMABOND ADVANCED (GAUZE/BANDAGES/DRESSINGS) ×2
DERMABOND ADVANCED .7 DNX12 (GAUZE/BANDAGES/DRESSINGS) ×2 IMPLANT
DRAIN PENROSE 1/2X12 LTX STRL (WOUND CARE) IMPLANT
DRAPE C-ARM 42X72 X-RAY (DRAPES) ×3 IMPLANT
DRAPE CHEST BREAST 15X10 FENES (DRAPES) ×3 IMPLANT
ELECT REM PT RETURN 9FT ADLT (ELECTROSURGICAL) ×3
ELECTRODE REM PT RTRN 9FT ADLT (ELECTROSURGICAL) ×2 IMPLANT
GAUZE SPONGE 2X2 8PLY STRL LF (GAUZE/BANDAGES/DRESSINGS) ×2 IMPLANT
GAUZE SPONGE 4X4 16PLY XRAY LF (GAUZE/BANDAGES/DRESSINGS) ×4 IMPLANT
GLOVE BIO SURGEON STRL SZ7.5 (GLOVE) ×4 IMPLANT
GLOVE BIOGEL M 6.5 STRL (GLOVE) ×2 IMPLANT
GLOVE BIOGEL PI IND STRL 6.5 (GLOVE) IMPLANT
GLOVE BIOGEL PI IND STRL 7.5 (GLOVE) IMPLANT
GLOVE BIOGEL PI IND STRL 8 (GLOVE) ×2 IMPLANT
GLOVE BIOGEL PI INDICATOR 6.5 (GLOVE) ×6
GLOVE BIOGEL PI INDICATOR 7.5 (GLOVE) ×2
GLOVE BIOGEL PI INDICATOR 8 (GLOVE) ×2
GLOVE ECLIPSE 6.5 STRL STRAW (GLOVE) ×2 IMPLANT
GLOVE SURG SS PI 7.5 STRL IVOR (GLOVE) ×2 IMPLANT
GOWN PREVENTION PLUS XLARGE (GOWN DISPOSABLE) ×2 IMPLANT
GOWN STRL NON-REIN LRG LVL3 (GOWN DISPOSABLE) ×8 IMPLANT
KIT BASIN OR (CUSTOM PROCEDURE TRAY) ×3 IMPLANT
KIT ROOM TURNOVER OR (KITS) ×3 IMPLANT
NDL 18GX1X1/2 (RX/OR ONLY) (NEEDLE) ×2 IMPLANT
NDL HYPO 25GX1X1/2 BEV (NEEDLE) ×2 IMPLANT
NEEDLE 18GX1X1/2 (RX/OR ONLY) (NEEDLE) ×3 IMPLANT
NEEDLE 22X1 1/2 (OR ONLY) (NEEDLE) ×3 IMPLANT
NEEDLE HYPO 25GX1X1/2 BEV (NEEDLE) ×3 IMPLANT
NS IRRIG 1000ML POUR BTL (IV SOLUTION) ×3 IMPLANT
PACK CV ACCESS (CUSTOM PROCEDURE TRAY) ×3 IMPLANT
PACK SURGICAL SETUP 50X90 (CUSTOM PROCEDURE TRAY) ×2 IMPLANT
PAD ARMBOARD 7.5X6 YLW CONV (MISCELLANEOUS) ×6 IMPLANT
SPONGE GAUZE 2X2 STER 10/PKG (GAUZE/BANDAGES/DRESSINGS) ×1
SPONGE GAUZE 4X4 12PLY (GAUZE/BANDAGES/DRESSINGS) ×3 IMPLANT
SPONGE SURGIFOAM ABS GEL 100 (HEMOSTASIS) IMPLANT
SUT ETHILON 3 0 PS 1 (SUTURE) ×3 IMPLANT
SUT PROLENE 6 0 BV (SUTURE) ×3 IMPLANT
SUT VIC AB 3-0 SH 27 (SUTURE) ×3
SUT VIC AB 3-0 SH 27X BRD (SUTURE) ×2 IMPLANT
SUT VICRYL 4-0 PS2 18IN ABS (SUTURE) ×3 IMPLANT
SYR 20CC LL (SYRINGE) ×6 IMPLANT
SYR 30ML LL (SYRINGE) IMPLANT
SYR 5ML LL (SYRINGE) ×6 IMPLANT
SYR CONTROL 10ML LL (SYRINGE) ×3 IMPLANT
SYRINGE 10CC LL (SYRINGE) ×3 IMPLANT
TAPE CLOTH SURG 4X10 WHT LF (GAUZE/BANDAGES/DRESSINGS) ×1 IMPLANT
TOWEL OR 17X24 6PK STRL BLUE (TOWEL DISPOSABLE) ×3 IMPLANT
TOWEL OR 17X26 10 PK STRL BLUE (TOWEL DISPOSABLE) ×4 IMPLANT
UNDERPAD 30X30 INCONTINENT (UNDERPADS AND DIAPERS) ×3 IMPLANT
WATER STERILE IRR 1000ML POUR (IV SOLUTION) ×3 IMPLANT

## 2012-03-18 NOTE — Progress Notes (Signed)
[facility] PROGRESS NOTE Interim History: 66 year old presents with SOB and lower extremities edema. He was diagnosed with Acute on chronic HF exacerbation. He was found to have pleural effusion and has moderate pericardial effusion with no physiologic tamponade. He develop acute on chronic renal failure. He is now requiring dialysis.   Assessment/Plan: Acute on chronic CKD:  - Secondary to Heart failure exacerbation low albumin,  evolution of ATN. Cr baseline  1.9. Worsening creatinine. - Temporary HD cath placed 03/07/12 . Patient on HD. - Vascular for fistula.  Acute on chronic combined systolic and diastolic CHF (congestive heart failure) / right pleural effusion  - EF 50 to 55 %  12/10. Repeated on 12/18 EF of 40%.  - Chest x-ray 02/29/2012 showed an enlarging effusion. thoracentesis with 1.8 L of fluid removed on 12/10. culture shows no growth. added cytology evaluation of pleural fluid. A repeat chest CT on 12/17 still shows moderate to large size rt sided pleural effusion.  - S/p right thoracentesis done with chest tube placed as well on 12/20  - Patient required a vent overnight on 12/20 after the procedure   Pericardial effusion: - No obvious tamponade physiology. Coumadin has been on hold for pericardial window . We will discuss with cardiology on resuming his Coumadin.  - S/p pericardiocentesis done 12/20 with drain placed. Removed on 12/21.  - Appreciate CTVS and recommendations   Urinary retention: - Patient had 708 cc in bladder on bladder scan 12/10. Foley catheter placed. No hydronephrosis on renal ultrasound.  - Continue with Flomax.  - Can d/c home with foley, outpatient urology followup. Will need to make sure patient able to afford medications prior to discharge.  - Neosporin cream to catheter area for irritation.   Microcytic anemia: - Anemia of chronic disease  - Hb stable   Hypokalemia / hyperkalemia: Resolved   Hyponatremia: - per HD  Skin wounds    -Seen by wound care nurse 02/28/2012. Benadryl cream when necessary for pruritus. Eucerin cream for maintenance of  moisturization.   DM Type II / hypoglycemia  - Lantus discontinued on 02/28/2012 secondary to hypoglycemia. - Maintained only on sliding scale insulin.   A fib  -Status post AV nodal ablation. Has pacemaker. Rate controlled, Coumadin on hold For planned pericardiocentesis for 12/20. -Cardio recommend holding anticoagulation for at least 1 week.  -His rate is controlled on metoprolol.   COPD  Stable. Continue Symbicort and Spiriva.   E. Coli UTI (lower urinary tract infection)  Patient completed antibiotic regimen with rocephin    Code Status: Full.  Family Communication:  Disposition Plan:pending PT eval. , likely will need SNF given deconditioning   Consultants:  Renal  Cardiology  CVTS  Vascular  Procedures: Thoracentesis 12/10. Pericardiocentesis and rt thoracentessis on 12/20    Antibiotics: Rocephin 02/26/2012--->02/29/12  cipro 12/9 --> 04/06/2012 zinacef 12/19->12/20  Fortaz on 12/22 for 4 doses   HPI/Subjective: Feel bad with some nauseas.  Objective: Filed Vitals:   03/17/12 2345 02/29/2012 0012 03/10/2012 0613 03/06/2012 0810  BP: 93/48 110/61 105/61 118/74  Pulse: 80 81 82 88  Temp:  97.7 F (36.5 C) 97.1 F (36.2 C) 98.5 F (36.9 C)  TempSrc:  Oral Oral Oral  Resp: 17 16 15 18   Height:      Weight:  75.6 kg (166 lb 10.7 oz)    SpO2:  96% 95% 98%    Intake/Output Summary (Last 24 hours) at 03/16/2012 1031 Last data filed at 02/22/2012 0600  Gross  per 24 hour  Intake    600 ml  Output   3351 ml  Net  -2751 ml   Filed Weights   03/17/12 1915 03/17/12 2337 03/11/2012 0012  Weight: 79.5 kg (175 lb 4.3 oz) 74.5 kg (164 lb 3.9 oz) 75.6 kg (166 lb 10.7 oz)    Exam:  General: Alert, awake, oriented x3, in no acute distress.  HEENT: No bruits, no goiter + JVD Heart: Regular rate and rhythm, without murmurs, rubs, gallops.  Lungs: Good  air movement, crackles on right side base. Abdomen: Soft, nontender, nondistended, positive bowel sounds.  Neuro: Grossly intact, nonfocal.   Data Reviewed: Basic Metabolic Panel:  Lab 03/17/12 1610 03/16/12 0500 03/15/12 0405 03/14/12 0345 03/13/12 0356  NA 128* 127* 130* 121* 129*  K 5.7* 5.3* 4.9 5.1 4.6  CL 91* 91* 96 88* 95*  CO2 26 26 28 24 23   GLUCOSE 105* 110* 112* 59* 78  BUN 49* 37* 24* 41* 33*  CREATININE 3.89* 3.24* 2.36* 3.08* 2.45*  CALCIUM 8.8 8.6 8.2* 8.2* 8.3*  MG -- -- -- -- --  PHOS 5.8* 5.6* 3.7 5.6* 3.9   Liver Function Tests:  Lab 03/17/12 0410 03/16/12 0500 03/15/12 0405 03/14/12 0345 03/13/12 0356  AST 25 25 20  -- 16  ALT 14 14 14  -- 11  ALKPHOS 137* 120* 95 -- 69  BILITOT 0.6 0.5 0.5 -- 0.6  PROT 7.1 7.0 6.5 -- 6.5  ALBUMIN 2.1* 2.2* 2.1* 2.2* 2.2*   No results found for this basename: LIPASE:5,AMYLASE:5 in the last 168 hours No results found for this basename: AMMONIA:5 in the last 168 hours CBC:  Lab 03/13/2012 0852 03/17/12 0410 03/16/12 0500 03/15/12 0405 03/14/12 0345  WBC 7.6 8.8 8.8 7.4 8.4  NEUTROABS -- -- -- -- --  HGB 9.6* 9.7* 9.8* 9.5* 10.5*  HCT 31.4* 32.5* 33.5* 31.6* 35.4*  MCV 80.3 81.0 83.5 81.4 80.8  PLT 145* 154 150 141* 180   Cardiac Enzymes:  Lab 03/14/12 0525  CKTOTAL 40  CKMB 5.5*  CKMBINDEX --  TROPONINI <0.30   BNP (last 3 results)  Basename 02/29/12 0505 02/26/12 0105 04/07/11 0934  PROBNP 8263.0* 8685.0* 205.0*   CBG:  Lab 02/24/2012 0756 03/13/2012 0448 02/28/2012 0018 03/17/12 1654 03/17/12 1126  GLUCAP 110* 134* 103* 142* 121*    Recent Results (from the past 240 hour(s))  URINE CULTURE     Status: Normal   Collection Time   03/09/12  6:14 AM      Component Value Range Status Comment   Specimen Description URINE, CLEAN CATCH   Final    Special Requests NONE   Final    Culture  Setup Time 03/09/2012 15:19   Final    Colony Count NO GROWTH   Final    Culture NO GROWTH   Final    Report Status  03/10/2012 FINAL   Final   URINE CULTURE     Status: Normal   Collection Time   04-Apr-2012  1:56 AM      Component Value Range Status Comment   Specimen Description URINE, CATHETERIZED   Final    Special Requests CX ADDED AT 0242 ON [id number]   Final    Culture  Setup Time 04/04/2012 02:49   Final    Colony Count NO GROWTH   Final    Culture NO GROWTH   Final    Report Status 03/12/2012 FINAL   Final   SURGICAL PCR SCREEN  Status: Abnormal   Collection Time   03/09/2012  5:16 AM      Component Value Range Status Comment   MRSA, PCR POSITIVE (*) NEGATIVE Final    Staphylococcus aureus POSITIVE (*) NEGATIVE Final   TISSUE CULTURE     Status: Normal   Collection Time   03/22/2012  4:43 PM      Component Value Range Status Comment   Specimen Description TISSUE PERICARDIUM   Final    Special Requests NONE   Final    Gram Stain     Final    Value: NO WBC SEEN     NO ORGANISMS SEEN   Culture NO GROWTH 3 DAYS   Final    Report Status 03/15/2012 FINAL   Final   BODY FLUID CULTURE     Status: Normal   Collection Time   03/04/2012  4:48 PM      Component Value Range Status Comment   Specimen Description FLUID PERICARDIAL   Final    Special Requests NONE   Final    Gram Stain     Final    Value: NO WBC SEEN     NO ORGANISMS SEEN   Culture NO GROWTH 3 DAYS   Final    Report Status 03/15/2012 FINAL   Final   BODY FLUID CULTURE     Status: Normal   Collection Time   03/02/2012  4:50 PM      Component Value Range Status Comment   Specimen Description FLUID RIGHT PLEURAL   Final    Special Requests NONE   Final    Gram Stain     Final    Value: RARE WBC PRESENT, PREDOMINANTLY MONONUCLEAR     NO ORGANISMS SEEN   Culture NO GROWTH 3 DAYS   Final    Report Status 03/15/2012 FINAL   Final      Studies: Dg Chest 2 View  03/17/2012  *RADIOLOGY REPORT*  Clinical Data: Shortness of breath.  CHEST - 2 VIEW  Comparison: 03/16/2012  Findings: The right chest tube has been removed. There is no  definite right pneumothorax.  Patchy right basilar lung densities. Heart size remains enlarged.  Dialysis catheter tip near the SVC/right atrium junction.   Again noted is a cardiac pacemaker. Decreased interstitial densities suggest decreased edema. Persistent densities in the azygos lobe.  Impression:  Removal of the right chest tube without a definite pneumothorax.  Decreasing pulmonary edema.  Patchy densities at the right lung base are probably associated with volume loss.   Original Report Authenticated By: Richarda Overlie, M.D.     Scheduled Meds:   . budesonide-formoterol  2 puff Inhalation BID  . calcium acetate  667 mg Oral TID WC  . darbepoetin (ARANESP) injection - DIALYSIS  40 mcg Intravenous Q Fri-HD  . doxercalciferol  1 mcg Intravenous Q M,W,F-HD  . feeding supplement  1 Container Oral Q24H  . gabapentin  100 mg Oral TID  . insulin aspart  0-24 Units Subcutaneous Q4H  . metoprolol succinate  25 mg Oral QHS  . multivitamin  1 tablet Oral QHS  . pantoprazole  40 mg Oral Daily  . polyethylene glycol  17 g Oral Daily  . senna-docusate  1 tablet Oral BID  . simvastatin  40 mg Oral QHS  . sodium chloride  3 mL Intravenous Q12H  . Tamsulosin HCl  0.4 mg Oral QPC supper  . tiotropium  18 mcg Inhalation Daily   Continuous Infusions:  Marinda Elk  [facility] Pager [phone]. If 8PM-8AM, please contact night-coverage at [email or web address], password [facility] 02/29/2012, 10:31 AM  LOS: 22 days

## 2012-03-18 NOTE — Op Note (Signed)
NAME: [name]   MRN: [id number] DOB: [birth year]    DATE OF OPERATION: 03/17/2012  PREOP DIAGNOSIS: chronic kidney disease  POSTOP DIAGNOSIS: same  PROCEDURE:  1. Placement of right IJ Diatek catheter (23 cm) and removal of right IJ temporary catheter 2. Left radial cephalic AV fistula   SURGEON: Di Kindle. Edilia Bo, MD, FACS  ASSIST: Doreatha Massed, PA  ANESTHESIA: local with sedation   EBL: minimal  INDICATIONS: [name] is a 66 y.o. male who has recently begun dialysis. We were asked to change his temporary catheter to a long-term catheter and also place long-term access.  FINDINGS: calcified radial artery. 3.5 mm vein.  TECHNIQUE: Patient was brought to the operating him sedated by anesthesia. The right neck and upper chest were prepped and draped in the usual sterile fashion. The catheter was prepped into the field. As the catheter and into the IJ fairly high I made a small incision below this level and the catheter was dissected free and divided after it was clamped. The distal portion was removed. A wire was advanced through the old catheter and the old catheter was removed over the wire. The dilator and peel-away sheath were passed over the wire and the wire and dilator removed. The 28 cm catheter was advanced through the peel-away sheath in position and the right atrium and the peel-away sheath was removed. The exit site the catheter was selected and the skin anesthetized between the 2 areas. The catheter was then brought through the tunnel cut to the appropriate length and the distal ports were attached to ports withdrew easily and then flushed with heparin and saline and filled with concentrated heparin. The catheter was secured at its exit site with 3-0 nylon suture. The IJ cannulation site was closed with a 4-0 subcuticular stitch.  Attention was then turned to the left upper extremity. This was prepped and draped in usual sterile fashion an oblique incision was  made over the wrist after the skin was anesthetized. The cephalic vein was dissected free and ligated distally. It irrigated nicely with heparinized saline the radial artery was dissected free beneath the fascia. The artery was calcified. Patient was heparinized. The radial artery was clamped proximally and distally a longitudinal arteriotomy was made. Vein was cut to the appropriate length spatulated and sewn end-to-side to the artery using continuous 6-0 Prolene suture. At the completion was an excellent thrill in the fistula and a good radial and ulnar signal with the Doppler. Hemostasis was obtained the wound. The wounds closed the deep layer 3-0 Vicryl and the skin closed with 4-0 Vicryl. Dermabond was applied. The patient tolerated the procedure well and transferred to the recovery room in stable condition. All needle and sponge counts were correct.   Waverly Ferrari, MD, FACS [facility]  DATE OF DICTATION:   03/06/2012

## 2012-03-18 NOTE — Anesthesia Preprocedure Evaluation (Addendum)
Anesthesia Evaluation  Patient identified by MRN, date of birth, ID band Patient awake    Reviewed: Allergy & Precautions, H&P , NPO status , Patient's Chart, lab work & pertinent test results  Airway Mallampati: I TM Distance: >3 FB Neck ROM: full    Dental   Pulmonary COPDCurrent Smoker, former smoker,  breath sounds clear to auscultation        Cardiovascular hypertension, + CAD, + Cardiac Stents, + Peripheral Vascular Disease and +CHF + dysrhythmias Atrial Fibrillation + pacemaker + Cardiac Defibrillator Rhythm:regular Rate:Normal     Neuro/Psych    GI/Hepatic (+)     substance abuse  alcohol use,   Endo/Other  diabetes, Type 2, Insulin Dependent  Renal/GU CRFRenal disease     Musculoskeletal   Abdominal   Peds  Hematology   Anesthesia Other Findings   Reproductive/Obstetrics                          Anesthesia Physical Anesthesia Plan  ASA: IV  Anesthesia Plan: MAC   Post-op Pain Management:    Induction: Intravenous  Airway Management Planned: Simple Face Mask  Additional Equipment:   Intra-op Plan:   Post-operative Plan:   Informed Consent: I have reviewed the patients History and Physical, chart, labs and discussed the procedure including the risks, benefits and alternatives for the proposed anesthesia with the patient or authorized representative who has indicated his/her understanding and acceptance.     Plan Discussed with: CRNA and Surgeon  Anesthesia Plan Comments:         Anesthesia Quick Evaluation

## 2012-03-18 NOTE — Preoperative (Signed)
Beta Blockers   Reason not to administer Beta Blockers:Not Applicable 

## 2012-03-18 NOTE — Transfer of Care (Signed)
Immediate Anesthesia Transfer of Care Note  Patient: [name]  Procedure(s) Performed: Procedure(s) (LRB) with comments: REMOVAL OF A DIALYSIS CATHETER (Right) - removal of temporary dialysis catheter right internal jugular INSERTION OF DIALYSIS CATHETER (Right) - internal jugular vein ARTERIOVENOUS (AV) FISTULA CREATION (Left)  Patient Location: PACU  Anesthesia Type:MAC  Level of Consciousness: awake  Airway & Oxygen Therapy: Patient Spontanous Breathing and Patient connected to face mask oxygen  Post-op Assessment: Report given to PACU RN and Post -op Vital signs reviewed and stable  Post vital signs: Reviewed and stable  Complications: No apparent anesthesia complications

## 2012-03-18 NOTE — Progress Notes (Signed)
Patient ID: [name], male   DOB: 07-15-1945, 66 y.o.   MRN: [id number] S:No new complaints O:BP 118/74  Pulse 88  Temp 98.5 F (36.9 C) (Oral)  Resp 18  Ht 5\' 5"  (1.651 m)  Wt 75.6 kg (166 lb 10.7 oz)  BMI 27.73 kg/m2  SpO2 98%  Intake/Output Summary (Last 24 hours) at 03/15/2012 1243 Last data filed at 03/17/2012 0600  Gross per 24 hour  Intake    300 ml  Output   3276 ml  Net  -2976 ml   Intake/Output: I/O last 3 completed shifts: In: 850 [P.O.:800; IV Piggyback:50] Out: 3601 [Urine:600; Other:3001]  Intake/Output this shift:    Weight change: 0.3 kg (10.6 oz) Gen:WD chronically ill-appearing WM CVS:no rub Resp:decreased BS at bases WUJ:WJXBJY Ext:1+ pretib edema   Lab 03/12/2012 0857 03/17/12 0410 03/16/12 0500 03/15/12 0405 03/14/12 0345 03/13/12 0356 03/12/12 0408  NA 131* 128* 127* 130* 121* 129* 130*  K 4.2 5.7* 5.3* 4.9 5.1 4.6 4.0  CL 94* 91* 91* 96 88* 95* 97  CO2 26 26 26 28 24 23 24   GLUCOSE 102* 105* 110* 112* 59* 78 103*  BUN 28* 49* 37* 24* 41* 33* 27*  CREATININE 2.41* 3.89* 3.24* 2.36* 3.08* 2.45* 2.33*  ALBUMIN 2.0* 2.1* 2.2* 2.1* 2.2* 2.2* --  CALCIUM 8.3* 8.8 8.6 8.2* 8.2* 8.3* 8.2*  PHOS 3.8 5.8* 5.6* 3.7 5.6* 3.9 --  AST -- 25 25 20  -- 16 --  ALT -- 14 14 14  -- 11 --   Liver Function Tests:  Lab 03/07/2012 0857 03/17/12 0410 03/16/12 0500 03/15/12 0405  AST -- 25 25 20   ALT -- 14 14 14   ALKPHOS -- 137* 120* 95  BILITOT -- 0.6 0.5 0.5  PROT -- 7.1 7.0 6.5  ALBUMIN 2.0* 2.1* 2.2* --   No results found for this basename: LIPASE:3,AMYLASE:3 in the last 168 hours No results found for this basename: AMMONIA:3 in the last 168 hours CBC:  Lab 03/06/2012 0852 03/17/12 0410 03/16/12 0500 03/15/12 0405 03/14/12 0345  WBC 7.6 8.8 8.8 -- --  NEUTROABS -- -- -- -- --  HGB 9.6* 9.7* 9.8* -- --  HCT 31.4* 32.5* 33.5* -- --  MCV 80.3 81.0 83.5 81.4 80.8  PLT 145* 154 150 -- --   Cardiac Enzymes:  Lab 03/14/12 0525  CKTOTAL 40  CKMB 5.5*    CKMBINDEX --  TROPONINI <0.30   CBG:  Lab 03/16/2012 0756 02/23/2012 0448 03/11/2012 0018 03/17/12 1654 03/17/12 1126  GLUCAP 110* 134* 103* 142* 121*    Iron Studies: No results found for this basename: IRON,TIBC,TRANSFERRIN,FERRITIN in the last 72 hours Studies/Results: Dg Chest 2 View  03/17/2012  *RADIOLOGY REPORT*  Clinical Data: Shortness of breath.  CHEST - 2 VIEW  Comparison: 03/16/2012  Findings: The right chest tube has been removed. There is no definite right pneumothorax.  Patchy right basilar lung densities. Heart size remains enlarged.  Dialysis catheter tip near the SVC/right atrium junction.   Again noted is a cardiac pacemaker. Decreased interstitial densities suggest decreased edema. Persistent densities in the azygos lobe.  Impression:  Removal of the right chest tube without a definite pneumothorax.  Decreasing pulmonary edema.  Patchy densities at the right lung base are probably associated with volume loss.   Original Report Authenticated By: Richarda Overlie, M.D.       . budesonide-formoterol  2 puff Inhalation BID  . calcium acetate  667 mg Oral TID WC  . darbepoetin (ARANESP)  injection - DIALYSIS  40 mcg Intravenous Q Fri-HD  . doxercalciferol  1 mcg Intravenous Q M,W,F-HD  . feeding supplement  1 Container Oral Q24H  . gabapentin  100 mg Oral TID  . insulin aspart  0-24 Units Subcutaneous Q4H  . metoprolol succinate  25 mg Oral QHS  . multivitamin  1 tablet Oral QHS  . pantoprazole  40 mg Oral Daily  . polyethylene glycol  17 g Oral Daily  . senna-docusate  1 tablet Oral BID  . simvastatin  40 mg Oral QHS  . sodium chloride  3 mL Intravenous Q12H  . Tamsulosin HCl  0.4 mg Oral QPC supper  . tiotropium  18 mcg Inhalation Daily    BMET    Component Value Date/Time   NA 131* 03/02/2012 0857   K 4.2 03/15/2012 0857   CL 94* 02/23/2012 0857   CO2 26 02/22/2012 0857   GLUCOSE 102* 03/21/2012 0857   GLUCOSE 373* 03/26/2006 1049   BUN 28* 03/13/2012 0857    CREATININE 2.41* 02/27/2012 0857   CREATININE 1.91* 03/13/2011 0000   CALCIUM 8.3* 02/29/2012 0857   GFRNONAA 26* 03/07/2012 0857   GFRAA 31* 03/13/2012 0857   CBC    Component Value Date/Time   WBC 7.6 03/17/2012 0852   RBC 3.91* 03/11/2012 0852   HGB 9.6* 03/17/2012 0852   HCT 31.4* 02/28/2012 0852   PLT 145* 03/06/2012 0852   MCV 80.3 03/17/2012 0852   MCH 24.6* 02/28/2012 0852   MCHC 30.6 03/12/2012 0852   RDW 20.7* 03/05/2012 0852   LYMPHSABS 0.5* 03/09/2012 1412   MONOABS 1.0 03/09/2012 1412   EOSABS 0.2 03/09/2012 1412   BASOSABS 0.0 03/09/2012 1412    Assessment/Plan:  1. AKI/CKD w/o recovery and not responding to lasix.  Has required intermittent HD over the last several weeks.  Slight increase in UOP over the last 24 hours and had HD yesterday.  Will plan for HD on reg basis and hope he starts to regain renal function. Will need to have permanent access and appreciate VVS evaluation.  2. Anemia epo/fe  3. Hpth Vit D  4. CM with R HF ascites, try to lessen  5. Pericard effusion s/p window per CVS and cards  6. Malnutrition. suppl  7. COPD severe  8. Vascular access- for PC and AVF per VVS    Orville Mena A

## 2012-03-18 NOTE — H&P (Signed)
[facility] [place]  Patient name: [name] MRN: [id number] DOB: 12/08/1945 Sex: male  REASON FOR CONSULT: needs tunneled dialysis catheter in long-term hemodialysis access. Referred by Dr. Deterding  HPI: [name] is a 66 y.o. male who was admitted on 02/26/2012 with shortness of breath and edema. He was felt to have decompensated systolic congestive heart failure. He has a history of chronic kidney disease and is currently being dialyzed with a temporary dialysis catheter in his right internal jugular vein. Vascular surgery was consult in order to convert that to a tunneled dialysis catheter and also evaluate him for long-term hemodialysis access. He states that he is right-handed. He states that his shortness of breath has improved significantly.  He does have a history of atrial fibrillation. I have reviewed his current medications and it does not appear that he is on Coumadin. He is 6 days status post subxiphoid pericardial window.  Past Medical History  Diagnosis Date  . ALCOHOL ABUSE, HX OF 04/30/2008  . 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 Atrial flutter 09/20/2006  . 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 Chronic systolic heart failure 09/20/2006    previous EF 25%;  Echocardiogram 12/10/10: Mild-moderate LVH, EF 50-55%, mild MR, moderate LAE, moderate RAE, mild RVE with moderately reduced RV SF, PASP 50, small pericardial effusion  . COPD 12/17/2006  . CORONARY ARTERY DISEASE 09/20/2006    s/p stent to OM;  catheterization 12/06: Mid to distal LAD 30%, D2 20%, proximal circumflex 30%, OM 2 stents patent, ostial RCA 50%, mid RCA 40%, distal RCA multiple 40%  . 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 DYSPEPSIA&OTHER SPEC DISORDERS FUNCTION STOMACH 04/04/2009  . HYPERTENSION 12/17/2006  . OBESITY 04/30/2008  . HLD (hyperlipidemia) 09/20/2006  . OTITIS EXTERNA,  CHRONIC NEC 12/28/2006  . PERSONAL HX COLONIC POLYPS 05/02/2009  . PSORIASIS 04/30/2008  . CKD (chronic kidney disease) 04/30/2008    creatinine 2.2 range    Family History  Problem Relation Age of Onset  . Lung cancer Mother     and aunt  . Heart attack Father   . Diabetes Sister     and aunt  . Colon cancer Neg Hx     SOCIAL HISTORY: History  Substance Use Topics  . Smoking status: Former Smoker -- 1.0 packs/day for 50 years    Types: Cigarettes    Quit date: 03/23/2005  . Smokeless tobacco: Never Used  . Alcohol Use: No     Comment: quit drinking 2007    Allergies  Allergen Reactions  . Aspirin     REACTION: Swelling, trouble breathing  . Tetracycline Hcl     REACTION: Swelling, trouble breathing    Current Facility-Administered Medications  Medication Dose Route Frequency Provider Last Rate Last Dose  . albuterol (PROVENTIL HFA;VENTOLIN HFA) 108 (90 BASE) MCG/ACT inhaler 2 puff  2 puff Inhalation Q6H PRN Arshad N Kakrakandy, MD   2 puff at 03/06/12 1036  . budesonide-formoterol (SYMBICORT) 160-4.5 MCG/ACT inhaler 2 puff  2 puff Inhalation BID Arshad N Kakrakandy, MD   2 puff at 03/17/12 1038  . calcium acetate (PHOSLO) capsule 667 mg  667 mg Oral TID WC Cynthia B Dunham, MD   667 mg at 03/17/12 0806  . darbepoetin (ARANESP) injection 40 mcg  40 mcg Intravenous Q Fri-HD Saima Rizwan, MD      . diphenhydrAMINE-zinc acetate (BENADRYL) 2-0.1 %   cream   Topical TID PRN Christina P Rama, MD   1 application at 02/27/12 1718  . doxercalciferol (HECTOROL) injection 1 mcg  1 mcg Intravenous Q M,W,F-HD Cynthia B Dunham, MD   1 mcg at 03/08/2012 0951  . feeding supplement (RESOURCE BREEZE) liquid 1 Container  1 Container Oral Q24H Samantha J Worley, RD   1 Container at 03/15/12 1129  . fentaNYL (SUBLIMAZE) injection 25-50 mcg  25-50 mcg Intravenous Q2H PRN Donielle M Zimmerman, PA   25 mcg at 03/13/12 1120  . gabapentin (NEURONTIN) capsule 100 mg  100 mg Oral TID Arshad N Kakrakandy, MD    100 mg at 03/15/12 1100  . guaiFENesin tablet 200 mg  200 mg Oral Q6H PRN Belkys A Regalado, MD   200 mg at 03/06/12 2156  . insulin aspart (novoLOG) injection 0-24 Units  0-24 Units Subcutaneous Q4H Donielle M Zimmerman, PA   2 Units at 03/16/12 2355  . metoprolol succinate (TOPROL-XL) 24 hr tablet 25 mg  25 mg Oral QHS Donielle M Zimmerman, PA   25 mg at 03/14/12 2223  . multivitamin (RENA-VIT) tablet 1 tablet  1 tablet Oral QHS James L Deterding, MD   1 tablet at 03/15/12 2339  . oxyCODONE-acetaminophen (PERCOCET/ROXICET) 5-325 MG per tablet 1-2 tablet  1-2 tablet Oral Q4H PRN Donielle M Zimmerman, PA   2 tablet at 03/15/12 1144  . pantoprazole (PROTONIX) EC tablet 40 mg  40 mg Oral Daily Arshad N Kakrakandy, MD   40 mg at 03/16/12 0927  . polyethylene glycol (MIRALAX / GLYCOLAX) packet 17 g  17 g Oral Daily Belkys A Regalado, MD   17 g at 03/16/12 0926  . senna-docusate (Senokot-S) tablet 1 tablet  1 tablet Oral BID Belkys A Regalado, MD   1 tablet at 03/16/12 0928  . senna-docusate (Senokot-S) tablet 1 tablet  1 tablet Oral QHS PRN Donielle M Zimmerman, PA      . simvastatin (ZOCOR) tablet 40 mg  40 mg Oral QHS Arshad N Kakrakandy, MD   40 mg at 03/15/12 2339  . sodium chloride 0.9 % injection 3 mL  3 mL Intravenous Q12H Arshad N Kakrakandy, MD   3 mL at 03/16/12 2200  . Tamsulosin HCl (FLOMAX) capsule 0.4 mg  0.4 mg Oral QPC supper Christina P Rama, MD   0.4 mg at 03/16/12 1757  . tiotropium (SPIRIVA) inhalation capsule 18 mcg  18 mcg Inhalation Daily Arshad N Kakrakandy, MD   18 mcg at 03/17/12 1038  . traMADol (ULTRAM) tablet 50 mg  50 mg Oral Q6H PRN Peter Van Trigt, MD   50 mg at 03/15/12 0417    REVIEW OF SYSTEMS: [X ] denotes positive finding; [  ] denotes negative finding CARDIOVASCULAR:  [X ] chest pain   2 days ago  [ ] chest pressure   [ ] palpitations   [ ] orthopnea   [X ] dyspnea on exertion    PULMONARY:   [ ] productive cough   [ ] asthma   [ ] wheezing NEUROLOGIC:   [ ]  weakness  [ ] paresthesias  [ ] aphasia  [ ] amaurosis  [ ] dizziness HEMATOLOGIC:   [ ] bleeding problems   [ ] clotting disorders MUSCULOSKELETAL:  [ ] joint pain   [ ] joint swelling [ ] leg swelling GASTROINTESTINAL: [ ]  blood in stool  [ ]  hematemesis GENITOURINARY:  [ ]  dysuria  [ ]  hematuria PSYCHIATRIC:  [ ]   history of major depression INTEGUMENTARY:  [ ] rashes  [ ] ulcers CONSTITUTIONAL:  [ ] fever   [ ] chills  PHYSICAL EXAM: Filed Vitals:   03/17/12 0000 03/17/12 0348 03/17/12 0349 03/17/12 0800  BP: 109/58 111/65  114/67  Pulse: 81 87  84  Temp: 98.5 F (36.9 C) 98.2 F (36.8 C)  97.8 F (36.6 C)  TempSrc: Oral Oral  Oral  Resp: 11 19  21  Height:      Weight:   168 lb 10.4 oz (76.5 kg)   SpO2: 96% 88%  96%   Body mass index is 28.07 kg/(m^2). GENERAL: The patient is a well-nourished male, in no acute distress. The vital signs are documented above. CARDIOVASCULAR: There is a regular rate and rhythm. He has a palpable radial and brachial pulses bilaterally. He has significant bilateral lower extremity edema. PULMONARY: There is good air exchange bilaterally without wheezing or rales. ABDOMEN: Soft and non-tender with normal pitched bowel sounds.  MUSCULOSKELETAL: There are no major deformities or cyanosis. NEUROLOGIC: No focal weakness or paresthesias are detected. SKIN: he has hyperpigmentation bilaterally consistent with chronic venous insufficiency. PSYCHIATRIC: The patient has a normal affect. He does appear to have a reasonable forearm and upper arm cephalic vein in the left arm.  DATA:  Lab Results  Component Value Date   WBC 8.8 03/17/2012   HGB 9.7* 03/17/2012   HCT 32.5* 03/17/2012   MCV 81.0 03/17/2012   PLT 154 03/17/2012   Lab Results  Component Value Date   NA 128* 03/17/2012   K 5.7* 03/17/2012   CL 91* 03/17/2012   CO2 26 03/17/2012   Lab Results  Component Value Date   CREATININE 3.89* 03/17/2012   Lab Results  Component Value  Date   INR 1.49 03/10/2012   INR 1.71* 03/09/2012   INR 1.63* 03/08/2012   Lab Results  Component Value Date   HGBA1C 8.4* 01/08/2012   CBG (last 3)   Basename 03/17/12 0720 03/17/12 0348 03/16/12 2347  GLUCAP 99 100* 125*   It does not appear that he has had vein mapping.  MEDICAL ISSUES: The patient appears to be a good candidate for a left radial cephalic or brachial cephalic fistula. I will try to obtain a vein mapping of the left arm today. We will tentatively plan placement of a tunneled dialysis catheter and a left upper extremity AV fistula tomorrow afternoon. I have reviewed the procedure potential complications with the patient including but not limited to bleeding, wound healing problems, failure of the fistula to mature, need for further interventions to improve performance of the fistula. All of his questions were answered and he is agreeable to proceed.  Ugochi Henzler S [facility] [place] Beeper: [phone]     Lab Results   Component  Value  Date    HGBA1C  8.4*  01/08/2012    CBG (last 3)   Basename  03/17/12 0720  03/17/12 0348  03/16/12 2347   GLUCAP  99  100*  125*    It does not appear that he has had vein mapping.  MEDICAL ISSUES:  The patient appears to be a good candidate for a left radial cephalic or brachial cephalic fistula. I will try to obtain a vein mapping of the left arm today. We will tentatively plan placement of a tunneled dialysis catheter and a left upper extremity AV fistula tomorrow afternoon. I have reviewed the procedure potential complications with the patient including but not limited to bleeding, wound healing problems, failure of the fistula to mature, need for further interventions to improve performance of the fistula. All of his questions were answered and he is agreeable to proceed.   Serene Kopf S  [facility] [place]  Beeper: [phone]

## 2012-03-18 NOTE — Anesthesia Postprocedure Evaluation (Signed)
  Anesthesia Post-op Note  Patient: [name]  Procedure(s) Performed: Procedure(s) (LRB) with comments: REMOVAL OF A DIALYSIS CATHETER (Right) - removal of temporary dialysis catheter right internal jugular INSERTION OF DIALYSIS CATHETER (Right) - internal jugular vein ARTERIOVENOUS (AV) FISTULA CREATION (Left)  Patient Location: PACU  Anesthesia Type:General  Level of Consciousness: awake  Airway and Oxygen Therapy: Patient Spontanous Breathing  Post-op Pain: mild  Post-op Assessment: Post-op Vital signs reviewed, Patient's Cardiovascular Status Stable, Respiratory Function Stable, Patent Airway, No signs of Nausea or vomiting and Pain level controlled  Post-op Vital Signs: stable  Complications: No apparent anesthesia complications

## 2012-03-18 NOTE — Progress Notes (Signed)
VASCULAR LAB PRELIMINARY  PRELIMINARY  PRELIMINARY  PRELIMINARY  Left Upper Extremity Vein Map    Cephalic  Segment Diameter Depth Comment  1. Axilla 3.35mm mm   2. Mid upper arm 3.2mm mm   3. Above AC 2.8mm mm   4. In [facility] 3.41mm mm Branch between [facility] and below AC measures 3.79mm  5. Below AC 3.67mm mm   6. Mid forearm 3.25mm mm Branch between mid forearm and wrist measures 2.4mm  7. Wrist 3.47mm mm    mm mm    mm mm    mm mm    Basilic  Segment Diameter Depth Comment  1. Axilla 7.27mm 11.17mm   2. Mid upper arm 6.82mm 7.41mm   3. Above [facility] 2.33mm 4.76mm prox branch  4. In [facility] 1.17mm mm   5. Below AC mm mm   6. Mid forearm mm mm   7. Wrist mm mm    mm mm    mm mm    mm mm     [place], RVT 03/22/2012, 11:37 AM

## 2012-03-19 ENCOUNTER — Inpatient Hospital Stay (HOSPITAL_COMMUNITY): Payer: Medicare Other

## 2012-03-19 DIAGNOSIS — F1021 Alcohol dependence, in remission: Secondary | ICD-10-CM

## 2012-03-19 LAB — GLUCOSE, CAPILLARY
Glucose-Capillary: 95 mg/dL (ref 70–99)
Glucose-Capillary: 189 mg/dL — ABNORMAL HIGH (ref 70–99)

## 2012-03-19 LAB — RENAL FUNCTION PANEL
Albumin: 2 g/dL — ABNORMAL LOW (ref 3.5–5.2)
CO2: 28 mEq/L (ref 19–32)
Chloride: 98 mEq/L (ref 96–112)
Creatinine, Ser: 2.18 mg/dL — ABNORMAL HIGH (ref 0.50–1.35)
GFR calc Af Amer: 35 mL/min — ABNORMAL LOW (ref >90)
GFR calc non Af Amer: 30 mL/min — ABNORMAL LOW (ref >90)
Sodium: 134 mEq/L — ABNORMAL LOW (ref 135–145)

## 2012-03-19 LAB — CBC
Platelets: 140 10*3/uL — ABNORMAL LOW (ref 150–400)
RBC: 3.86 MIL/uL — ABNORMAL LOW (ref 4.22–5.81)
RDW: 20.4 % — ABNORMAL HIGH (ref 11.5–15.5)
WBC: 8.2 10*3/uL (ref 4.0–10.5)

## 2012-03-19 MED ORDER — SODIUM CHLORIDE 0.9 % IV SOLN: 100.0000 mL | INTRAVENOUS | Status: DC | PRN

## 2012-03-19 MED ORDER — NEPRO/CARBSTEADY PO LIQD
237.0000 mL | ORAL | Status: DC | PRN
  Filled 2012-03-19: qty 237

## 2012-03-19 MED ORDER — DOXERCALCIFEROL 4 MCG/2ML IV SOLN
INTRAVENOUS | Status: AC
  Administered 2012-03-19: 1 ug via INTRAVENOUS
  Filled 2012-03-19: qty 2

## 2012-03-19 MED ORDER — LIDOCAINE-PRILOCAINE 2.5-2.5 % EX CREA: 1.0000 "application " | TOPICAL_CREAM | CUTANEOUS | Status: DC | PRN

## 2012-03-19 MED ORDER — HEPARIN SODIUM (PORCINE) 1000 UNIT/ML DIALYSIS: 1000.0000 [IU] | INTRAMUSCULAR | Status: DC | PRN

## 2012-03-19 MED ORDER — HEPARIN SODIUM (PORCINE) 1000 UNIT/ML DIALYSIS: 20.0000 [IU]/kg | INTRAMUSCULAR | Status: DC | PRN

## 2012-03-19 MED ORDER — LIDOCAINE HCL (PF) 1 % IJ SOLN: 5.0000 mL | INTRAMUSCULAR | Status: DC | PRN

## 2012-03-19 MED ORDER — OXYCODONE-ACETAMINOPHEN 5-325 MG PO TABS
ORAL_TABLET | ORAL | Status: AC
  Administered 2012-03-19: 2 via ORAL
  Filled 2012-03-19: qty 2

## 2012-03-19 MED ORDER — ALTEPLASE 2 MG IJ SOLR
4.0000 mg | Freq: Once | INTRAMUSCULAR | Status: AC | PRN
  Administered 2012-03-19: 4 mg
  Filled 2012-03-19: qty 4

## 2012-03-19 MED ORDER — PENTAFLUOROPROP-TETRAFLUOROETH EX AERO: 1.0000 "application " | INHALATION_SPRAY | CUTANEOUS | Status: DC | PRN

## 2012-03-19 MED ORDER — ALTEPLASE 2 MG IJ SOLR: 2.0000 mg | Freq: Once | INTRAMUSCULAR | Status: DC | PRN

## 2012-03-19 MED ORDER — DARBEPOETIN ALFA-POLYSORBATE 40 MCG/0.4ML IJ SOLN
INTRAMUSCULAR | Status: AC
  Administered 2012-03-19: 40 ug via INTRAVENOUS
  Filled 2012-03-19: qty 0.4

## 2012-03-19 NOTE — Progress Notes (Signed)
[facility] PROGRESS NOTE Interim History: 66 year old presents with SOB and lower extremities edema. He was diagnosed with Acute on chronic HF exacerbation. He was found to have pleural effusion and has moderate pericardial effusion with no physiologic tamponade. He develop acute on chronic renal failure. He is now requiring dialysis.   Assessment/Plan: Acute on chronic CKD:  - Secondary to Heart failure exacerbation low albumin,  evolution of ATN. Cr baseline  1.9. Worsening creatinine. - Temporary HD cath placed 03/07/12 . Patient on HD. Intermittent HD, management per renal. - Vascular for fistula.  Diarrhea: - started having diarrhea, recently on antibiotics. - c. Dif PCR  Acute on chronic combined systolic and diastolic CHF (congestive heart failure) / right pleural effusion  - EF 50 to 55 %  12/10. Repeated on 12/18 EF of 40%.  - Chest x-ray 02/29/2012 showed an enlarging effusion. thoracentesis with 1.8 L of fluid removed on 12/10. culture shows no growth. added cytology evaluation of pleural fluid.  - - S/p right thoracentesis done with chest tube placed as well on 12/20 - A repeat chest CT on 12/17 still shows moderate to large size rt sided pleural effusion.  - Patient required a vent overnight on 12/20 after the procedure   Pericardial effusion: - No obvious tamponade physiology. Coumadin has been on hold for pericardial window . We will discuss with cardiology on resuming his Coumadin.  - S/p pericardiocentesis done 12/20 with drain placed. Removed on 12/21.  - Appreciate CTVS and recommendations   Urinary retention: - Patient had 708 cc in bladder on bladder scan 12/10. Foley catheter placed. No hydronephrosis on renal ultrasound.  - Continue with Flomax.  - Can d/c home with foley, outpatient urology followup. Will need to make sure patient able to afford medications prior to discharge.  - Neosporin cream to catheter area for irritation.   Microcytic anemia: -  Anemia of chronic disease  - Hb stable   DM Type II / hypoglycemia  - Lantus discontinued on 02/28/2012 secondary to hypoglycemia. - Maintained only on sliding scale insulin.   A fib  -Status post AV nodal ablation. Has pacemaker. Rate controlled, Coumadin on hold For planned pericardiocentesis for 12/20. -Cardio recommend holding anticoagulation for at least 1 week.  -His rate is controlled on metoprolol.   E. Coli UTI (lower urinary tract infection)  Patient completed antibiotic regimen with rocephin    Code Status: Full.  Family Communication:  Disposition Plan:pending PT eval. , likely will need SNF given deconditioning   Consultants:  Renal  Cardiology  CVTS  Vascular  Procedures: Thoracentesis 12/10. Pericardiocentesis and rt thoracentessis on 12/20    Antibiotics: Rocephin 02/26/2012--->02/29/12  cipro 12/9 --> 12.20.2013 zinacef 12/19->12/20  Fortaz on 12/22 for 4 doses   HPI/Subjective: Diarrhea for the last 12hrs  Objective: Filed Vitals:   03/07/2012 1743 02/28/2012 2119 03/03/2012 2131 03/19/12 0601  BP: 102/53 99/31  99/61  Pulse: 92 83  79  Temp: 97.1 F (36.2 C) 97.9 F (36.6 C)  97.8 F (36.6 C)  TempSrc: Oral Oral  Oral  Resp: 16 18  20   Height:      Weight:  76 kg (167 lb 8.8 oz)    SpO2: 96% 100% 97% 97%    Intake/Output Summary (Last 24 hours) at 03/19/12 1040 Last data filed at 03/19/12 0600  Gross per 24 hour  Intake    990 ml  Output     25 ml  Net    965 ml  Filed Weights   03/17/12 2337 02/24/2012 0012 02/26/2012 2119  Weight: 74.5 kg (164 lb 3.9 oz) 75.6 kg (166 lb 10.7 oz) 76 kg (167 lb 8.8 oz)    Exam:  General: Alert, awake, oriented x3, in no acute distress.  HEENT: No bruits, no goiter + JVD Heart: Regular rate and rhythm, without murmurs, rubs, gallops.  Lungs: Good air movement, crackles on right side base. Abdomen: Soft, nontender, nondistended, positive bowel sounds.  Neuro: Grossly intact, nonfocal.   Data  Reviewed: Basic Metabolic Panel:  Lab 02/26/2012 1610 03/17/12 0410 03/16/12 0500 03/15/12 0405 03/14/12 0345  NA 131* 128* 127* 130* 121*  K 4.2 5.7* 5.3* 4.9 5.1  CL 94* 91* 91* 96 88*  CO2 26 26 26 28 24   GLUCOSE 102* 105* 110* 112* 59*  BUN 28* 49* 37* 24* 41*  CREATININE 2.41* 3.89* 3.24* 2.36* 3.08*  CALCIUM 8.3* 8.8 8.6 8.2* 8.2*  MG -- -- -- -- --  PHOS 3.8 5.8* 5.6* 3.7 5.6*   Liver Function Tests:  Lab 03/06/2012 0857 03/17/12 0410 03/16/12 0500 03/15/12 0405 03/14/12 0345 03/13/12 0356  AST -- 25 25 20  -- 16  ALT -- 14 14 14  -- 11  ALKPHOS -- 137* 120* 95 -- 69  BILITOT -- 0.6 0.5 0.5 -- 0.6  PROT -- 7.1 7.0 6.5 -- 6.5  ALBUMIN 2.0* 2.1* 2.2* 2.1* 2.2* --   No results found for this basename: LIPASE:5,AMYLASE:5 in the last 168 hours No results found for this basename: AMMONIA:5 in the last 168 hours CBC:  Lab 03/04/2012 0852 03/17/12 0410 03/16/12 0500 03/15/12 0405 03/14/12 0345  WBC 7.6 8.8 8.8 7.4 8.4  NEUTROABS -- -- -- -- --  HGB 9.6* 9.7* 9.8* 9.5* 10.5*  HCT 31.4* 32.5* 33.5* 31.6* 35.4*  MCV 80.3 81.0 83.5 81.4 80.8  PLT 145* 154 150 141* 180   Cardiac Enzymes:  Lab 03/14/12 0525  CKTOTAL 40  CKMB 5.5*  CKMBINDEX --  TROPONINI <0.30   BNP (last 3 results)  Basename 02/29/12 0505 02/26/12 0105 04/07/11 0934  PROBNP 8263.0* 8685.0* 205.0*   CBG:  Lab 03/19/12 0748 02/26/2012 2124 02/28/2012 1728 03/09/2012 1648 03/02/2012 1247  GLUCAP 95 127* 96 83 95    Recent Results (from the past 240 hour(s))  URINE CULTURE     Status: Normal   Collection Time   03/14/2012  1:56 AM      Component Value Range Status Comment   Specimen Description URINE, CATHETERIZED   Final    Special Requests CX ADDED AT 0242 ON [id number]   Final    Culture  Setup Time 02/22/2012 02:49   Final    Colony Count NO GROWTH   Final    Culture NO GROWTH   Final    Report Status 03/12/2012 FINAL   Final   SURGICAL PCR SCREEN     Status: Abnormal   Collection Time   03/22/2012  5:16 AM       Component Value Range Status Comment   MRSA, PCR POSITIVE (*) NEGATIVE Final    Staphylococcus aureus POSITIVE (*) NEGATIVE Final   TISSUE CULTURE     Status: Normal   Collection Time   03/10/2012  4:43 PM      Component Value Range Status Comment   Specimen Description TISSUE PERICARDIUM   Final    Special Requests NONE   Final    Gram Stain     Final    Value: NO WBC SEEN  NO ORGANISMS SEEN   Culture NO GROWTH 3 DAYS   Final    Report Status 03/15/2012 FINAL   Final   BODY FLUID CULTURE     Status: Normal   Collection Time   03/24/12  4:48 PM      Component Value Range Status Comment   Specimen Description FLUID PERICARDIAL   Final    Special Requests NONE   Final    Gram Stain     Final    Value: NO WBC SEEN     NO ORGANISMS SEEN   Culture NO GROWTH 3 DAYS   Final    Report Status 03/15/2012 FINAL   Final   BODY FLUID CULTURE     Status: Normal   Collection Time   March 24, 2012  4:50 PM      Component Value Range Status Comment   Specimen Description FLUID RIGHT PLEURAL   Final    Special Requests NONE   Final    Gram Stain     Final    Value: RARE WBC PRESENT, PREDOMINANTLY MONONUCLEAR     NO ORGANISMS SEEN   Culture NO GROWTH 3 DAYS   Final    Report Status 03/15/2012 FINAL   Final      Studies: Dg Chest Port 1 View  03/19/2012  *RADIOLOGY REPORT*  Clinical Data: Hemodialysis catheter placement.  PORTABLE CHEST - 1 VIEW  Comparison: 03/17/2012  Findings: Right dialysis catheter is in place with the tips in the upright atrium. No pneumothorax.  Cardiomegaly with vascular congestion.  Suspect mild interstitial edema.  Bibasilar atelectasis or infiltrates and small effusions are unchanged.  The left pacer is unchanged.  IMPRESSION: Right hemodialysis catheter tips in the upper right atrium.  Otherwise no significant change.   Original Report Authenticated By: Charlett Nose, M.D.    Dg Fluoro Guide Cv Line-no Report  03/13/2012  CLINICAL DATA: dialysis catheter   FLOURO  GUIDE CV LINE  Fluoroscopy was utilized by the requesting physician.  No radiographic  interpretation.      Scheduled Meds:    . budesonide-formoterol  2 puff Inhalation BID  . calcium acetate  667 mg Oral TID WC  . darbepoetin (ARANESP) injection - DIALYSIS  40 mcg Intravenous Q Fri-HD  . doxercalciferol  1 mcg Intravenous Q M,W,F-HD  . feeding supplement  1 Container Oral Q24H  . gabapentin  100 mg Oral TID  . insulin aspart  0-24 Units Subcutaneous Q4H  . metoprolol succinate  25 mg Oral QHS  . multivitamin  1 tablet Oral QHS  . pantoprazole  40 mg Oral Daily  . polyethylene glycol  17 g Oral Daily  . senna-docusate  1 tablet Oral BID  . simvastatin  40 mg Oral QHS  . sodium chloride  3 mL Intravenous Q12H  . Tamsulosin HCl  0.4 mg Oral QPC supper  . tiotropium  18 mcg Inhalation Daily   Continuous Infusions:    . sodium chloride 20 mL/hr at 03/12/2012 1347     FELIZ Rosine Beat  [facility] Pager [phone]. If 8PM-8AM, please contact night-coverage at [email or web address], password [facility] 03/19/2012, 10:40 AM  LOS: 23 days

## 2012-03-19 NOTE — Progress Notes (Signed)
Patient ID: [name], male   DOB: 26-Nov-1945, 66 y.o.   MRN: [id number] S:no new complaints O:BP 118/44  Pulse 79  Temp 97.7 F (36.5 C) (Oral)  Resp 26  Ht 5\' 5"  (1.651 m)  Wt 76 kg (167 lb 8.8 oz)  BMI 27.88 kg/m2  SpO2 95%  Intake/Output Summary (Last 24 hours) at 03/19/12 1201 Last data filed at 03/19/12 0600  Gross per 24 hour  Intake    990 ml  Output     25 ml  Net    965 ml   Intake/Output: I/O last 3 completed shifts: In: 1040 [P.O.:840; I.V.:150; IV Piggyback:50] Out: 3201 [Urine:175; Other:3001; Blood:25]  Intake/Output this shift:    Weight change: -0.8 kg (-1 lb 12.2 oz) Gen:WD WN WM, lethargic but arousable CVS:no rub Resp:scattered rhonchi OVF:IEPPIR Ext:1+edema, L AVF +T/B   Lab 03/19/12 1000 03/06/2012 0857 03/17/12 0410 03/16/12 0500 03/15/12 0405 03/14/12 0345 03/13/12 0356  NA 134* 131* 128* 127* 130* 121* 129*  K 4.5 4.2 5.7* 5.3* 4.9 5.1 4.6  CL 98 94* 91* 91* 96 88* 95*  CO2 28 26 26 26 28 24 23   GLUCOSE 131* 102* 105* 110* 112* 59* 78  BUN 26* 28* 49* 37* 24* 41* 33*  CREATININE 2.18* 2.41* 3.89* 3.24* 2.36* 3.08* 2.45*  ALBUMIN 2.0* 2.0* 2.1* 2.2* 2.1* 2.2* 2.2*  CALCIUM 8.1* 8.3* 8.8 8.6 8.2* 8.2* 8.3*  PHOS 3.6 3.8 5.8* 5.6* 3.7 5.6* 3.9  AST -- -- 25 25 20  -- 16  ALT -- -- 14 14 14  -- 11   Liver Function Tests:  Lab 03/19/12 1000 03/07/2012 0857 03/17/12 0410 03/16/12 0500 03/15/12 0405  AST -- -- 25 25 20   ALT -- -- 14 14 14   ALKPHOS -- -- 137* 120* 95  BILITOT -- -- 0.6 0.5 0.5  PROT -- -- 7.1 7.0 6.5  ALBUMIN 2.0* 2.0* 2.1* -- --   No results found for this basename: LIPASE:3,AMYLASE:3 in the last 168 hours No results found for this basename: AMMONIA:3 in the last 168 hours CBC:  Lab 03/19/12 1000 03/22/2012 0852 03/17/12 0410 03/16/12 0500 03/15/12 0405  WBC 8.2 7.6 8.8 -- --  NEUTROABS -- -- -- -- --  HGB 9.5* 9.6* 9.7* -- --  HCT 31.2* 31.4* 32.5* -- --  MCV 80.8 80.3 81.0 83.5 81.4  PLT 140* 145* 154 -- --    Cardiac Enzymes:  Lab 03/14/12 0525  CKTOTAL 40  CKMB 5.5*  CKMBINDEX --  TROPONINI <0.30   CBG:  Lab 03/19/12 0748 03/15/2012 2124 03/21/2012 1728 03/01/2012 1648 02/24/2012 1247  GLUCAP 95 127* 96 83 95    Iron Studies: No results found for this basename: IRON,TIBC,TRANSFERRIN,FERRITIN in the last 72 hours Studies/Results: Dg Chest Port 1 View  03/19/2012  *RADIOLOGY REPORT*  Clinical Data: Hemodialysis catheter placement.  PORTABLE CHEST - 1 VIEW  Comparison: 03/17/2012  Findings: Right dialysis catheter is in place with the tips in the upright atrium. No pneumothorax.  Cardiomegaly with vascular congestion.  Suspect mild interstitial edema.  Bibasilar atelectasis or infiltrates and small effusions are unchanged.  The left pacer is unchanged.  IMPRESSION: Right hemodialysis catheter tips in the upper right atrium.  Otherwise no significant change.   Original Report Authenticated By: Charlett Nose, M.D.    Dg Fluoro Guide Cv Line-no Report  03/04/2012  CLINICAL DATA: dialysis catheter   FLOURO GUIDE CV LINE  Fluoroscopy was utilized by the requesting physician.  No radiographic  interpretation.        Marland Kitchen  budesonide-formoterol  2 puff Inhalation BID  . calcium acetate  667 mg Oral TID WC  . darbepoetin (ARANESP) injection - DIALYSIS  40 mcg Intravenous Q Fri-HD  . doxercalciferol  1 mcg Intravenous Q M,W,F-HD  . feeding supplement  1 Container Oral Q24H  . gabapentin  100 mg Oral TID  . insulin aspart  0-24 Units Subcutaneous Q4H  . metoprolol succinate  25 mg Oral QHS  . multivitamin  1 tablet Oral QHS  . pantoprazole  40 mg Oral Daily  . polyethylene glycol  17 g Oral Daily  . senna-docusate  1 tablet Oral BID  . simvastatin  40 mg Oral QHS  . sodium chloride  3 mL Intravenous Q12H  . Tamsulosin HCl  0.4 mg Oral QPC supper  . tiotropium  18 mcg Inhalation Daily    BMET    Component Value Date/Time   NA 134* 03/19/2012 1000   K 4.5 03/19/2012 1000   CL 98 03/19/2012 1000    CO2 28 03/19/2012 1000   GLUCOSE 131* 03/19/2012 1000   GLUCOSE 373* 03/26/2006 1049   BUN 26* 03/19/2012 1000   CREATININE 2.18* 03/19/2012 1000   CREATININE 1.91* 03/13/2011 0000   CALCIUM 8.1* 03/19/2012 1000   GFRNONAA 30* 03/19/2012 1000   GFRAA 35* 03/19/2012 1000   CBC    Component Value Date/Time   WBC 8.2 03/19/2012 1000   RBC 3.86* 03/19/2012 1000   HGB 9.5* 03/19/2012 1000   HCT 31.2* 03/19/2012 1000   PLT 140* 03/19/2012 1000   MCV 80.8 03/19/2012 1000   MCH 24.6* 03/19/2012 1000   MCHC 30.4 03/19/2012 1000   RDW 20.4* 03/19/2012 1000   LYMPHSABS 0.5* 03/09/2012 1412   MONOABS 1.0 03/09/2012 1412   EOSABS 0.2 03/09/2012 1412   BASOSABS 0.0 03/09/2012 1412    Assessment/Plan:  1. AKI/CKD w/o recovery and not responding to lasix. Has required intermittent HD over the last several weeks. Slight increase in UOP but still remains oliguric.  Will plan for HD on reg basis and hope he starts to regain renal function. S/P RIJ PC yesterday and appreciate VVS evaluation.  2. Anemia epo/fe  3. Hpth Vit D  4. CM with R HF ascites, try to lessen  5. Pericard effusion s/p window per CVS and cards  6. Malnutrition. suppl  7. COPD severe  8. Vascular access- s/p PC and AVF per VVS  9. Diarrhea- agree with r/o for C Diff [facility] A

## 2012-03-19 NOTE — Progress Notes (Addendum)
[facility] [place] Postoperative hemodialysis access   Date of Surgery:  03/05/2012 Surgeon: Edilia Bo   Subjective:  Pain at incision site.  Denies pain/numbness in left hand   PHYSICAL EXAMINATION:  Filed Vitals:   03/19/12 0601  BP: 99/61  Pulse: 79  Temp: 97.8 F (36.6 C)  Resp: 20    Incision is c/d/i sensation in digits is intact; There is  Thrill; there is bruit. The graft/fistula is palpable    ASSESSMENT/PLAN:  [name] is a 66 y.o. year old male who is s/p left radio cephalic AVF.  -graft is patent and pt does not have any evidence of steal sx -f/u with Dr. Edilia Bo in 6 weeks to check maturation of AVF -will sign off-call as needed.   Doreatha Massed, PA-C [facility] [phone]

## 2012-03-19 NOTE — Procedures (Signed)
Patient was seen on dialysis and the procedure was supervised. BFR 400 Via RIJ PC BP is 116/40.  Patient appears to be tolerating treatment well and switched to a 4K bath.

## 2012-03-20 ENCOUNTER — Inpatient Hospital Stay (HOSPITAL_COMMUNITY): Payer: Medicare Other | Admitting: Anesthesiology

## 2012-03-20 ENCOUNTER — Inpatient Hospital Stay (HOSPITAL_COMMUNITY): Payer: Medicare Other

## 2012-03-20 ENCOUNTER — Encounter (HOSPITAL_COMMUNITY): Payer: Self-pay | Admitting: Anesthesiology

## 2012-03-20 DIAGNOSIS — R579 Shock, unspecified: Secondary | ICD-10-CM

## 2012-03-20 DIAGNOSIS — E871 Hypo-osmolality and hyponatremia: Secondary | ICD-10-CM

## 2012-03-20 DIAGNOSIS — I469 Cardiac arrest, cause unspecified: Secondary | ICD-10-CM

## 2012-03-20 DIAGNOSIS — R4182 Altered mental status, unspecified: Secondary | ICD-10-CM

## 2012-03-20 DIAGNOSIS — J9610 Chronic respiratory failure, unspecified whether with hypoxia or hypercapnia: Secondary | ICD-10-CM

## 2012-03-20 DIAGNOSIS — I4891 Unspecified atrial fibrillation: Secondary | ICD-10-CM

## 2012-03-20 DIAGNOSIS — J9600 Acute respiratory failure, unspecified whether with hypoxia or hypercapnia: Secondary | ICD-10-CM

## 2012-03-20 LAB — URINE MICROSCOPIC-ADD ON

## 2012-03-20 LAB — COMPREHENSIVE METABOLIC PANEL
ALT: 7 U/L (ref 0–53)
AST: 27 U/L (ref 0–37)
Albumin: 1.8 g/dL — ABNORMAL LOW (ref 3.5–5.2)
Alkaline Phosphatase: 147 U/L — ABNORMAL HIGH (ref 39–117)
Chloride: 96 mEq/L (ref 96–112)
Potassium: 4.1 mEq/L (ref 3.5–5.1)
Sodium: 132 mEq/L — ABNORMAL LOW (ref 135–145)
Total Bilirubin: 0.5 mg/dL (ref 0.3–1.2)

## 2012-03-20 LAB — CBC
HCT: 31.8 % — ABNORMAL LOW (ref 39.0–52.0)
MCH: 24 pg — ABNORMAL LOW (ref 26.0–34.0)
MCV: 83.3 fL (ref 78.0–100.0)
Platelets: 132 10*3/uL — ABNORMAL LOW (ref 150–400)
Platelets: 135 10*3/uL — ABNORMAL LOW (ref 150–400)
RBC: 4.12 MIL/uL — ABNORMAL LOW (ref 4.22–5.81)
RDW: 20.8 % — ABNORMAL HIGH (ref 11.5–15.5)
RDW: 20.9 % — ABNORMAL HIGH (ref 11.5–15.5)
WBC: 7.9 10*3/uL (ref 4.0–10.5)
WBC: 7.6 10*3/uL (ref 4.0–10.5)

## 2012-03-20 LAB — GLUCOSE, CAPILLARY
Glucose-Capillary: 104 mg/dL — ABNORMAL HIGH (ref 70–99)
Glucose-Capillary: 124 mg/dL — ABNORMAL HIGH (ref 70–99)
Glucose-Capillary: 187 mg/dL — ABNORMAL HIGH (ref 70–99)
Glucose-Capillary: 117 mg/dL — ABNORMAL HIGH (ref 70–99)
Glucose-Capillary: 124 mg/dL — ABNORMAL HIGH (ref 70–99)

## 2012-03-20 LAB — PROTIME-INR: INR: 1.65 — ABNORMAL HIGH (ref 0.00–1.49)

## 2012-03-20 LAB — URINALYSIS, ROUTINE W REFLEX MICROSCOPIC
Glucose, UA: NEGATIVE mg/dL
Ketones, ur: 15 mg/dL — AB
pH: 5 (ref 5.0–8.0)

## 2012-03-20 LAB — TYPE AND SCREEN
ABO/RH(D): O POS
Antibody Screen: NEGATIVE

## 2012-03-20 LAB — FIBRINOGEN: Fibrinogen: 476 mg/dL — ABNORMAL HIGH (ref 204–475)

## 2012-03-20 LAB — CORTISOL: Cortisol, Plasma: 24.3 ug/dL

## 2012-03-20 LAB — POCT I-STAT 3, ART BLOOD GAS (G3+)
Bicarbonate: 23.5 mEq/L (ref 20.0–24.0)
O2 Saturation: 100 %
TCO2: 25 mmol/L (ref 0–100)
pCO2 arterial: 35.1 mmHg (ref 35.0–45.0)
pH, Arterial: 7.432 (ref 7.350–7.450)

## 2012-03-20 MED ORDER — VANCOMYCIN HCL 10 G IV SOLR
1500.0000 mg | Freq: Once | INTRAVENOUS | Status: AC
  Administered 2012-03-20: 1500 mg via INTRAVENOUS
  Filled 2012-03-20: qty 1500

## 2012-03-20 MED ORDER — VANCOMYCIN HCL IN DEXTROSE 1-5 GM/200ML-% IV SOLN
1000.0000 mg | Freq: Once | INTRAVENOUS | Status: DC
  Filled 2012-03-20: qty 200

## 2012-03-20 MED ORDER — FENTANYL CITRATE 0.05 MG/ML IJ SOLN
25.0000 ug | INTRAMUSCULAR | Status: DC | PRN
  Administered 2012-03-28: 50 ug via INTRAVENOUS
  Filled 2012-03-20: qty 2

## 2012-03-20 MED ORDER — BIOTENE DRY MOUTH MT LIQD
15.0000 mL | Freq: Four times a day (QID) | OROMUCOSAL | Status: DC
  Administered 2012-03-21 – 2012-03-29 (×35): 15 mL via OROMUCOSAL

## 2012-03-20 MED ORDER — NOREPINEPHRINE BITARTRATE 1 MG/ML IJ SOLN
2.0000 ug/min | INTRAVENOUS | Status: DC
  Administered 2012-03-20: 1.5 ug/min via INTRAVENOUS
  Filled 2012-03-20: qty 8

## 2012-03-20 MED ORDER — SODIUM CHLORIDE 0.9 % IV BOLUS (SEPSIS): 25.0000 mL/kg | Freq: Once | INTRAVENOUS | Status: DC

## 2012-03-20 MED ORDER — METRONIDAZOLE IVPB CUSTOM
2000.0000 mg | Freq: Three times a day (TID) | INTRAVENOUS | Status: DC
  Filled 2012-03-20 (×2): qty 400

## 2012-03-20 MED ORDER — CHLORHEXIDINE GLUCONATE 0.12 % MT SOLN
15.0000 mL | Freq: Two times a day (BID) | OROMUCOSAL | Status: DC
  Administered 2012-03-20 – 2012-03-29 (×18): 15 mL via OROMUCOSAL
  Filled 2012-03-20 (×18): qty 15

## 2012-03-20 MED ORDER — PIPERACILLIN-TAZOBACTAM 3.375 G IVPB 30 MIN
3.3750 g | Freq: Once | INTRAVENOUS | Status: AC
  Administered 2012-03-20: 3.375 g via INTRAVENOUS
  Filled 2012-03-20: qty 50

## 2012-03-20 MED ORDER — METRONIDAZOLE IN NACL 5-0.79 MG/ML-% IV SOLN
500.0000 mg | Freq: Three times a day (TID) | INTRAVENOUS | Status: DC
  Administered 2012-03-20 – 2012-03-22 (×5): 500 mg via INTRAVENOUS
  Filled 2012-03-20 (×6): qty 100

## 2012-03-20 MED ORDER — VECURONIUM BROMIDE 10 MG IV SOLR
INTRAVENOUS | Status: AC
  Administered 2012-03-20: 8 mg
  Filled 2012-03-20: qty 20

## 2012-03-20 MED ORDER — HALOPERIDOL LACTATE 5 MG/ML IJ SOLN
1.0000 mg | Freq: Four times a day (QID) | INTRAMUSCULAR | Status: DC | PRN
Start: 2012-03-20 — End: 2012-03-22

## 2012-03-20 MED ORDER — HYDROCORTISONE SOD SUCCINATE 100 MG IJ SOLR
50.0000 mg | Freq: Four times a day (QID) | INTRAMUSCULAR | Status: DC
  Administered 2012-03-20 – 2012-03-22 (×8): 50 mg via INTRAVENOUS
  Filled 2012-03-20 (×13): qty 1

## 2012-03-20 MED ORDER — METRONIDAZOLE IN NACL 5-0.79 MG/ML-% IV SOLN
500.0000 mg | Freq: Once | INTRAVENOUS | Status: AC
  Administered 2012-03-20: 500 mg via INTRAVENOUS
  Filled 2012-03-20: qty 100

## 2012-03-20 MED ORDER — PIPERACILLIN-TAZOBACTAM IN DEX 2-0.25 GM/50ML IV SOLN
2.2500 g | Freq: Three times a day (TID) | INTRAVENOUS | Status: DC
  Administered 2012-03-20 – 2012-03-22 (×5): 2.25 g via INTRAVENOUS
  Filled 2012-03-20 (×6): qty 50

## 2012-03-20 MED ORDER — SODIUM CHLORIDE 0.9 % IV SOLN: INTRAVENOUS | Status: DC

## 2012-03-20 MED ORDER — OXYCODONE HCL 5 MG PO TABS: 5.0000 mg | ORAL_TABLET | Freq: Four times a day (QID) | ORAL | Status: DC | PRN

## 2012-03-20 MED ORDER — MIDAZOLAM HCL 2 MG/2ML IJ SOLN: 1.0000 mg | INTRAMUSCULAR | Status: DC | PRN

## 2012-03-20 MED ORDER — OXYCODONE-ACETAMINOPHEN 5-325 MG PO TABS: 1.0000 | ORAL_TABLET | ORAL | Status: DC | PRN

## 2012-03-20 NOTE — Code Documentation (Signed)
CODE BLUE NOTE  Patient Name: [name]   MRN: [id number]   Date of Birth/ Sex: 05-Jul-1945 , male      Admission Date: 03-13-2012  Attending Provider: Marinda Elk, MD  Primary Diagnosis: Pericardial effusion    Indication: Pt was in his usual state of health until this AM, when he was noted to be unresponsive. Code blue was subsequently called. At the time of arrival on scene, ACLS protocol was underway.    Technical Description:  - CPR performance duration:  12 minutes  - Was defibrillation or cardioversion used? No   - Was external pacer placed? No  - Was patient intubated pre/post CPR? Yes    Medications Administered: Y = Yes; Blank = No Amiodarone    Atropine    Calcium    Epinephrine      Times 2  Lidocaine    Magnesium    Norepinephrine    Phenylephrine    Sodium bicarbonate    Vasopressin      Post CPR evaluation:  - Final Status - Was patient successfully resuscitated ? Yes - What is current rhythm? Tachycardia possible SVT - What is current hemodynamic status? unstable   Miscellaneous Information:  - Labs sent, including: none  - Primary team notified?  Yes  - Family Notified? No  - Additional notes/ transfer status: Primary team present during transfer to ICU and given verbal sign out of event   Judie Bonus, MD  03/20/2012, 11:26 AM

## 2012-03-20 NOTE — Progress Notes (Signed)
Paged [facility] card fellow to update on pt's change in status and arrival on 2100. Awaiting return phone call.

## 2012-03-20 NOTE — Progress Notes (Signed)
ANTIBIOTIC CONSULT NOTE - INITIAL  Pharmacy Consult for vanc/zosyn Indication: rule out sepsis/PNA  Allergies  Allergen Reactions  . Aspirin     REACTION: Swelling, trouble breathing  . Tetracycline Hcl     REACTION: Swelling, trouble breathing    Patient Measurements: Height: 5\' 5"  (165.1 cm) Weight: 170 lb 13.7 oz (77.5 kg) IBW/kg (Calculated) : 61.5   Vital Signs: Temp: 97.6 F (36.4 C) (12/29 1241) Temp src: Oral (12/29 1241) BP: 121/39 mmHg (12/29 1245) Pulse Rate: 87  (12/29 1245) Intake/Output from previous day: 12/28 0701 - 12/29 0700 In: 800 [P.O.:600; I.V.:200] Out: 1375 [Urine:25] Intake/Output from this shift:    Labs:  Basename 03/20/12 1220 03/20/12 0825 03/19/12 1000 03/21/2012 0857  WBC 7.6 7.9 8.2 --  HGB 9.2* 9.9* 9.5* --  PLT 135* 132* 140* --  LABCREA -- -- -- --  CREATININE 3.23* -- 2.18* 2.41*   Estimated Creatinine Clearance: 21.6 ml/min (by C-G formula based on Cr of 3.23). No results found for this basename: VANCOTROUGH:2,VANCOPEAK:2,VANCORANDOM:2,GENTTROUGH:2,GENTPEAK:2,GENTRANDOM:2,TOBRATROUGH:2,TOBRAPEAK:2,TOBRARND:2,AMIKACINPEAK:2,AMIKACINTROU:2,AMIKACIN:2, in the last 72 hours   Microbiology: Recent Results (from the past 720 hour(s))  URINE CULTURE     Status: Normal   Collection Time   02/26/12  1:28 AM      Component Value Range Status Comment   Specimen Description URINE, RANDOM   Final    Special Requests Normal   Final    Culture  Setup Time 02/26/2012 04:22   Final    Colony Count >=100,000 COLONIES/ML   Final    Culture     Final    Value: ESCHERICHIA COLI     Note: Two isolates with different morphologies were identified as the same organism.The most resistant organism was reported.   Report Status 02/29/2012 FINAL   Final    Organism ID, Bacteria ESCHERICHIA COLI   Final   BODY FLUID CULTURE     Status: Normal   Collection Time   03/01/12  2:09 PM      Component Value Range Status Comment   Specimen Description PLEURAL  RIGHT EFFUSION   Final    Special Requests Normal   Final    Gram Stain     Final    Value: RARE WBC PRESENT, PREDOMINANTLY PMN     NO ORGANISMS SEEN   Culture NO GROWTH 3 DAYS   Final    Report Status 03/05/2012 FINAL   Final   URINE CULTURE     Status: Normal   Collection Time   03/02/12  3:14 PM      Component Value Range Status Comment   Specimen Description URINE, CLEAN CATCH   Final    Special Requests NONE   Final    Culture  Setup Time 03/03/2012 01:27   Final    Colony Count NO GROWTH   Final    Culture NO GROWTH   Final    Report Status 03/04/2012 FINAL   Final   URINE CULTURE     Status: Normal   Collection Time   03/09/12  6:14 AM      Component Value Range Status Comment   Specimen Description URINE, CLEAN CATCH   Final    Special Requests NONE   Final    Culture  Setup Time 03/09/2012 15:19   Final    Colony Count NO GROWTH   Final    Culture NO GROWTH   Final    Report Status 03/10/2012 FINAL   Final   URINE CULTURE  Status: Normal   Collection Time   02/29/2012  1:56 AM      Component Value Range Status Comment   Specimen Description URINE, CATHETERIZED   Final    Special Requests CX ADDED AT 0242 ON [id number]   Final    Culture  Setup Time 02/29/2012 02:49   Final    Colony Count NO GROWTH   Final    Culture NO GROWTH   Final    Report Status 03/12/2012 FINAL   Final   SURGICAL PCR SCREEN     Status: Abnormal   Collection Time   03/10/2012  5:16 AM      Component Value Range Status Comment   MRSA, PCR POSITIVE (*) NEGATIVE Final    Staphylococcus aureus POSITIVE (*) NEGATIVE Final   TISSUE CULTURE     Status: Normal   Collection Time   02/22/2012  4:43 PM      Component Value Range Status Comment   Specimen Description TISSUE PERICARDIUM   Final    Special Requests NONE   Final    Gram Stain     Final    Value: NO WBC SEEN     NO ORGANISMS SEEN   Culture NO GROWTH 3 DAYS   Final    Report Status 03/15/2012 FINAL   Final   BODY FLUID CULTURE     Status:  Normal   Collection Time   03/12/2012  4:48 PM      Component Value Range Status Comment   Specimen Description FLUID PERICARDIAL   Final    Special Requests NONE   Final    Gram Stain     Final    Value: NO WBC SEEN     NO ORGANISMS SEEN   Culture NO GROWTH 3 DAYS   Final    Report Status 03/15/2012 FINAL   Final   BODY FLUID CULTURE     Status: Normal   Collection Time   03/18/2012  4:50 PM      Component Value Range Status Comment   Specimen Description FLUID RIGHT PLEURAL   Final    Special Requests NONE   Final    Gram Stain     Final    Value: RARE WBC PRESENT, PREDOMINANTLY MONONUCLEAR     NO ORGANISMS SEEN   Culture NO GROWTH 3 DAYS   Final    Report Status 03/15/2012 FINAL   Final     Medical History: Past Medical History  Diagnosis Date  . ALCOHOL ABUSE, HX OF 04/30/2008  . 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 Atrial flutter 09/20/2006  . 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 Chronic systolic heart failure 09/20/2006    previous EF 25%;  Echocardiogram 12/10/10: Mild-moderate LVH, EF 50-55%, mild MR, moderate LAE, moderate RAE, mild RVE with moderately reduced RV SF, PASP 50, small pericardial effusion  . COPD 12/17/2006  . CORONARY ARTERY DISEASE 09/20/2006    s/p stent to OM;  catheterization 12/06: Mid to distal LAD 30%, D2 20%, proximal circumflex 30%, OM 2 stents patent, ostial RCA 50%, mid RCA 40%, distal RCA multiple 40%  . 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 DYSPEPSIA&OTHER [facility] DISORDERS FUNCTION STOMACH 04/04/2009  . HYPERTENSION 12/17/2006  . OBESITY 04/30/2008  . HLD (hyperlipidemia) 09/20/2006  . OTITIS EXTERNA, CHRONIC NEC 12/28/2006  . PERSONAL HX COLONIC POLYPS 05/02/2009  . PSORIASIS 04/30/2008  . CKD (  chronic kidney disease) 04/30/2008    creatinine 2.2 range    Medications:  Scheduled:    . budesonide-formoterol  2 puff Inhalation BID  . calcium acetate   667 mg Oral TID WC  . darbepoetin (ARANESP) injection - DIALYSIS  40 mcg Intravenous Q Fri-HD  . doxercalciferol  1 mcg Intravenous Q M,W,F-HD  . feeding supplement  1 Container Oral Q24H  . gabapentin  100 mg Oral TID  . hydrocortisone sodium succinate  50 mg Intravenous Q6H  . insulin aspart  0-24 Units Subcutaneous Q4H  . metoprolol succinate  25 mg Oral QHS  . metronidazole  2,000 mg Intravenous Q8H  . metronidazole  500 mg Intravenous Once  . multivitamin  1 tablet Oral QHS  . pantoprazole  40 mg Oral Daily  . piperacillin-tazobactam (ZOSYN)  IV  2.25 g Intravenous Q8H  . piperacillin-tazobactam  3.375 g Intravenous Once  . polyethylene glycol  17 g Oral Daily  . senna-docusate  1 tablet Oral BID  . simvastatin  40 mg Oral QHS  . sodium chloride  25 mL/kg Intravenous Once  . sodium chloride  3 mL Intravenous Q12H  . Tamsulosin HCl  0.4 mg Oral QPC supper  . tiotropium  18 mcg Inhalation Daily  . vancomycin  1,500 mg Intravenous Once  . vecuronium      . [DISCONTINUED] vancomycin  1,000 mg Intravenous Once   Assessment: 66 yo who was admitted since 12/6 for resp failure. He has had a complicated course with pericardial effusion that required pericardial window. He coded this AM (PEA?) and was tx to ICU.   Goal of Therapy:  PreHD vanc = 15-25  Plan:  Vanc 1.5g IV x1 Zosyn 2.25g IV q8 Flagyl 500mg  IV q8  [name] 03/20/2012,1:20 PM

## 2012-03-20 NOTE — Progress Notes (Signed)
Dr. Alben Spittle with [facility] cards returned page and wants the cardiology fellow to come see the pt. I have paged the card fellow twice with no response. Dr. Alben Spittle said he would try the answering service for the fellow as he is not in the hospital.

## 2012-03-20 NOTE — Progress Notes (Addendum)
[facility] PROGRESS NOTE Interim History: 66 year old presents with SOB and lower extremities edema. He was diagnosed with Acute on chronic HF exacerbation. He was found to have pleural effusion and has moderate pericardial effusion with no physiologic tamponade. He develop acute on chronic renal failure. He is now requiring dialysis.   Assessment/Plan: Acute on chronic CKD:  - ? Evolution of ATN. Cr baseline  1.9. Worsening creatinine. - Temporary HD cath placed 03/07/12 . Patient on HD. Intermittent HD, management per renal. - Vascular for fistula. - diateck cath 12.27.2013  Encephalopathy: - Ct head 12.29.2013. - electrolyte WNL, PLt stable. - Not hypoxic or uremic - got several doses of oxycodone which can explain sleepy ness and lethargy. - no fever or leukocytosis.  Diarrhea: - started having diarrhea, recently on antibiotics. - c. Dif PCR pending, afebrile, no leukocytosis.  Acute on chronic combined systolic and diastolic CHF (congestive heart failure) / right pleural effusion  - EF 50 to 55 %  12/10. Repeated on 12/18 EF of 40%.  - Chest x-ray 02/29/2012 showed an enlarging effusion. thoracentesis with 1.8 L of fluid removed on 12/10. culture shows no growth. added cytology evaluation of pleural fluid.  - - S/p right thoracentesis done with chest tube placed as well on 12/20 - A repeat chest CT on 12/17 still shows moderate to large size rt sided pleural effusion.  - Patient required a vent overnight on 12/20 after the procedure   Pericardial effusion: - No obvious tamponade physiology. Coumadin has been on hold for pericardial window . We will discuss with cardiology on resuming his Coumadin.  - S/p pericardiocentesis done 12/20 with drain placed. Removed on 12/21.  - Appreciate CTVS and recommendations   Urinary retention: - Patient had 708 cc in bladder on bladder scan 12/10. Foley catheter placed. No hydronephrosis on renal ultrasound.  - Continue with Flomax.    - Can d/c home with foley, outpatient urology followup. Will need to make sure patient able to afford medications prior to discharge.  - Neosporin cream to catheter area for irritation.   Microcytic anemia: - Anemia of chronic disease  - Hb stable   DM Type II / hypoglycemia  - Lantus discontinued on 02/28/2012 secondary to hypoglycemia. - Maintained only on sliding scale insulin.   A fib  -Status post AV nodal ablation. Has pacemaker. Rate controlled, Coumadin on hold For planned pericardiocentesis for 12/20. -Cardio recommend holding anticoagulation for at least 1 week.  -His rate is controlled on metoprolol.   E. Coli UTI (lower urinary tract infection)  Patient completed antibiotic regimen with rocephin    Code Status: Full.  Family Communication:  Disposition Plan:pending PT eval. , likely will need SNF given deconditioning   Consultants:  Renal  Cardiology  CVTS  Vascular  Procedures: Thoracentesis 12/10. Pericardiocentesis and rt thoracentessis on 12/20    Antibiotics: Rocephin 02/26/2012--->02/29/12  cipro 12/9 --> 12.20.2013 zinacef 12/19->12/20  Fortaz on 12/22 for 4 doses   HPI/Subjective: No complains. ethargic, but still arousable Objective: Filed Vitals:   03/19/12 1226 03/19/12 1750 03/19/12 2152 03/20/12 0600  BP: 117/59 89/64 96/60  99/63  Pulse: 85 83 83 83  Temp: 97.1 F (36.2 C) 97.1 F (36.2 C) 98.5 F (36.9 C) 98.3 F (36.8 C)  TempSrc: Oral Oral Oral Axillary  Resp: 20 20 18 22   Height:      Weight:   77.5 kg (170 lb 13.7 oz)   SpO2: 96% 95% 93% 91%    Intake/Output Summary (  Last 24 hours) at 03/20/12 0956 Last data filed at 03/19/12 1800  Gross per 24 hour  Intake    800 ml  Output   1375 ml  Net   -575 ml   Filed Weights   04-15-2012 2119 03/19/12 0915 03/19/12 2152  Weight: 76 kg (167 lb 8.8 oz) 76 kg (167 lb 8.8 oz) 77.5 kg (170 lb 13.7 oz)    Exam:  General: Alert, awake, oriented x1, in no acute distress.  HEENT:  No bruits, no goiter + JVD Heart: Regular rate and rhythm, without murmurs, rubs, gallops.  Lungs: Good air movement, crackles on right side base. Abdomen: Soft, nontender, nondistended, positive bowel sounds.  Neuro: Grossly intact, nonfocal.   Data Reviewed: Basic Metabolic Panel:  Lab 03/19/12 4540 2012/04/15 0857 03/17/12 0410 03/16/12 0500 03/15/12 0405  NA 134* 131* 128* 127* 130*  K 4.5 4.2 5.7* 5.3* 4.9  CL 98 94* 91* 91* 96  CO2 28 26 26 26 28   GLUCOSE 131* 102* 105* 110* 112*  BUN 26* 28* 49* 37* 24*  CREATININE 2.18* 2.41* 3.89* 3.24* 2.36*  CALCIUM 8.1* 8.3* 8.8 8.6 8.2*  MG -- -- -- -- --  PHOS 3.6 3.8 5.8* 5.6* 3.7   Liver Function Tests:  Lab 03/19/12 1000 2012/04/15 0857 03/17/12 0410 03/16/12 0500 03/15/12 0405  AST -- -- 25 25 20   ALT -- -- 14 14 14   ALKPHOS -- -- 137* 120* 95  BILITOT -- -- 0.6 0.5 0.5  PROT -- -- 7.1 7.0 6.5  ALBUMIN 2.0* 2.0* 2.1* 2.2* 2.1*   No results found for this basename: LIPASE:5,AMYLASE:5 in the last 168 hours No results found for this basename: AMMONIA:5 in the last 168 hours CBC:  Lab 03/20/12 0825 03/19/12 1000 04/15/2012 0852 03/17/12 0410 03/16/12 0500  WBC 7.9 8.2 7.6 8.8 8.8  NEUTROABS -- -- -- -- --  HGB 9.9* 9.5* 9.6* 9.7* 9.8*  HCT 34.3* 31.2* 31.4* 32.5* 33.5*  MCV 83.3 80.8 80.3 81.0 83.5  PLT 132* 140* 145* 154 150   Cardiac Enzymes:  Lab 03/14/12 0525  CKTOTAL 40  CKMB 5.5*  CKMBINDEX --  TROPONINI <0.30   BNP (last 3 results)  Basename 02/29/12 0505 02/26/12 0105 04/07/11 0934  PROBNP 8263.0* 8685.0* 205.0*   CBG:  Lab 03/20/12 0819 03/20/12 0450 03/20/12 0419 03/20/12 0029 03/19/12 2123  GLUCAP 124* 76 47* 104* 170*    Recent Results (from the past 240 hour(s))  URINE CULTURE     Status: Normal   Collection Time   03/20/2012  1:56 AM      Component Value Range Status Comment   Specimen Description URINE, CATHETERIZED   Final    Special Requests CX ADDED AT 0242 ON [id number]   Final    Culture   Setup Time 03/12/2012 02:49   Final    Colony Count NO GROWTH   Final    Culture NO GROWTH   Final    Report Status 03/12/2012 FINAL   Final   SURGICAL PCR SCREEN     Status: Abnormal   Collection Time   03/02/2012  5:16 AM      Component Value Range Status Comment   MRSA, PCR POSITIVE (*) NEGATIVE Final    Staphylococcus aureus POSITIVE (*) NEGATIVE Final   TISSUE CULTURE     Status: Normal   Collection Time   03/06/2012  4:43 PM      Component Value Range Status Comment   Specimen Description TISSUE PERICARDIUM  Final    Special Requests NONE   Final    Gram Stain     Final    Value: NO WBC SEEN     NO ORGANISMS SEEN   Culture NO GROWTH 3 DAYS   Final    Report Status 03/15/2012 FINAL   Final   BODY FLUID CULTURE     Status: Normal   Collection Time   03/18/2012  4:48 PM      Component Value Range Status Comment   Specimen Description FLUID PERICARDIAL   Final    Special Requests NONE   Final    Gram Stain     Final    Value: NO WBC SEEN     NO ORGANISMS SEEN   Culture NO GROWTH 3 DAYS   Final    Report Status 03/15/2012 FINAL   Final   BODY FLUID CULTURE     Status: Normal   Collection Time   03/06/2012  4:50 PM      Component Value Range Status Comment   Specimen Description FLUID RIGHT PLEURAL   Final    Special Requests NONE   Final    Gram Stain     Final    Value: RARE WBC PRESENT, PREDOMINANTLY MONONUCLEAR     NO ORGANISMS SEEN   Culture NO GROWTH 3 DAYS   Final    Report Status 03/15/2012 FINAL   Final      Studies: Dg Chest Port 1 View  03/19/2012  *RADIOLOGY REPORT*  Clinical Data: Hemodialysis catheter placement.  PORTABLE CHEST - 1 VIEW  Comparison: 03/17/2012  Findings: Right dialysis catheter is in place with the tips in the upright atrium. No pneumothorax.  Cardiomegaly with vascular congestion.  Suspect mild interstitial edema.  Bibasilar atelectasis or infiltrates and small effusions are unchanged.  The left pacer is unchanged.  IMPRESSION: Right  hemodialysis catheter tips in the upper right atrium.  Otherwise no significant change.   Original Report Authenticated By: Charlett Nose, M.D.    Dg Fluoro Guide Cv Line-no Report  03/23/2012  CLINICAL DATA: dialysis catheter   FLOURO GUIDE CV LINE  Fluoroscopy was utilized by the requesting physician.  No radiographic  interpretation.      Scheduled Meds:    . budesonide-formoterol  2 puff Inhalation BID  . calcium acetate  667 mg Oral TID WC  . darbepoetin (ARANESP) injection - DIALYSIS  40 mcg Intravenous Q Fri-HD  . doxercalciferol  1 mcg Intravenous Q M,W,F-HD  . feeding supplement  1 Container Oral Q24H  . gabapentin  100 mg Oral TID  . insulin aspart  0-24 Units Subcutaneous Q4H  . metoprolol succinate  25 mg Oral QHS  . multivitamin  1 tablet Oral QHS  . pantoprazole  40 mg Oral Daily  . polyethylene glycol  17 g Oral Daily  . senna-docusate  1 tablet Oral BID  . simvastatin  40 mg Oral QHS  . sodium chloride  3 mL Intravenous Q12H  . Tamsulosin HCl  0.4 mg Oral QPC supper  . tiotropium  18 mcg Inhalation Daily   Continuous Infusions:    . sodium chloride 250 mL (03/19/12 1457)     Radonna Ricker Rosine Beat  [facility] Pager [phone]. If 8PM-8AM, please contact night-coverage at [email or web address], password [facility] 03/20/2012, 9:56 AM  LOS: 24 days

## 2012-03-20 NOTE — Progress Notes (Signed)
Paged cards fellow for the third time. Awaiting return call.

## 2012-03-20 NOTE — Code Documentation (Signed)
Code blue called at 1105, upon arrival to patients room ACLS underway.  Patient had been in his usual state of health this am and RN came to check on patient and found him unresponsive.  Cod blue called--see code blue sheet.  Patient was PEA at time of onset 2 amps epi given, intubated.  ROSC 149/49, hr SVT 140's.  Patient transported to 2115 via bed on zoll monitor and oxygen 100% via ambu bag.

## 2012-03-20 NOTE — Progress Notes (Signed)
Into Patient room on rounds, patient found unresponsive, no pulse.  CPR started and code called.

## 2012-03-20 NOTE — Progress Notes (Signed)
Attempted to notify [place] [facility] through the [facility] Fellow at [phone] to let them know this patient had been admitted to the unit under CCM's care. No return call upon completion of my shift. Night shift will continue to attempt to let them know. Pt. Is stable, but we want them to know about the change in patient status.

## 2012-03-20 NOTE — Progress Notes (Signed)
03/20/12 1100  Clinical Encounter Type  Visited With Patient not available;Health care provider  Visit Type Code   Responded to a code blue page. Patient was taken to ICU after CPR was performed. No family present. Follow up recommended. Veryl Speak

## 2012-03-20 NOTE — Progress Notes (Signed)
Agree with above.  Patient seen and examined.  No evidence of steal.  Pt will f/u as an outpatient with Dr. Edilia Bo

## 2012-03-20 NOTE — Consult Note (Signed)
Name: [name] MRN: [id number] DOB: 1945/11/08    LOS: 24  Referring Provider:  [facility] Reason for Referral:  Cardiac arrest and respiratory failure.  PULMONARY / CRITICAL CARE MEDICINE  HPI:  66 year old with history of alcohol abuse who presented to the hospital on December 6th with SOB, was found to have acute on chronic respiratory failure with EF of 40-45 percent who had an extremely complicated hospital course with pericardial effusion requiring a window, pleural effusion requiring a chest tube, renal failure requiring dialysis and liver failure from etoh.  On 12/29 patient presented to the ICU after a 12 minute PEA cardiac arrest that was likely related to respiratory failure ?narcotic use for pain.  Upon presenting to the ICU the patient was hypotensive and obtunded post code.  Past Medical History  Diagnosis Date  . ALCOHOL ABUSE, HX OF 04/30/2008  . 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 Atrial flutter 09/20/2006  . 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 Chronic systolic heart failure 09/20/2006    previous EF 25%;  Echocardiogram 12/10/10: Mild-moderate LVH, EF 50-55%, mild MR, moderate LAE, moderate RAE, mild RVE with moderately reduced RV SF, PASP 50, small pericardial effusion  . COPD 12/17/2006  . CORONARY ARTERY DISEASE 09/20/2006    s/p stent to OM;  catheterization 12/06: Mid to distal LAD 30%, D2 20%, proximal circumflex 30%, OM 2 stents patent, ostial RCA 50%, mid RCA 40%, distal RCA multiple 40%  . 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 DYSPEPSIA&OTHER [facility] DISORDERS FUNCTION STOMACH 04/04/2009  . HYPERTENSION 12/17/2006  . OBESITY 04/30/2008  . HLD (hyperlipidemia) 09/20/2006  . OTITIS EXTERNA, CHRONIC NEC 12/28/2006  . PERSONAL HX COLONIC POLYPS 05/02/2009  . PSORIASIS 04/30/2008  . CKD (chronic kidney disease) 04/30/2008    creatinine 2.2 range   Past Surgical History    Procedure Date  . Cardiac defibrillator placement   . Angioplasty   . Hernia repair     bilat  . Cardiac catheterization   . Coronary angioplasty     Stents placed  . Transthoracic echocardiogram 2005/2006/2008/2011  . Subxyphoid pericardial window 03/22/2012    Procedure: SUBXYPHOID PERICARDIAL WINDOW;  Surgeon: Kerin Perna, MD;  Location: [facility] OR;  Service: Thoracic;  Laterality: N/A;  Would like to follow in OR 17  . Chest tube insertion 03/06/2012    Procedure: CHEST TUBE INSERTION;  Surgeon: Kerin Perna, MD;  [facility] OR;  Service: Thoracic;  Laterality: Right;   Prior to Admission medications   Medication Sig Start Date End Date Taking? Authorizing Provider  albuterol (PROAIR HFA) 108 (90 BASE) MCG/ACT inhaler Inhale 2 puffs into the lungs every 6 (six) hours as needed. For shortness of breath 03/16/11  Yes Gordy Savers, MD  budesonide-formoterol [facility]) 160-4.5 MCG/ACT inhaler Inhale 2 puffs into the lungs 2 (two) times daily. 08/28/11 08/27/12 Yes Barbaraann Share, MD  CO-ENZYME Q-10 PO Take 1 capsule by mouth daily.   Yes Historical Provider, MD  furosemide (LASIX) 80 MG tablet Take 80 mg by mouth 2 (two) times daily. 04/08/11  Yes Beatrice Lecher, PA  gabapentin (NEURONTIN) 100 MG capsule Take 1 capsule (100 mg total) by mouth 3 (three) times daily. 01/08/12  Yes Gordy Savers, MD  hydrocortisone cream 1 % Apply to affected area 2 times daily 07/19/11 07/18/12 Yes Raeford Razor, MD  insulin glargine (LANTUS) 100 UNIT/ML injection  30 units at bedtime 01/08/12  Yes Gordy Savers, MD  insulin lispro (HUMALOG) 100 UNIT/ML injection Inject 20 Units into the skin 3 (three) times daily before meals. Inject 20 units before each meal - if blood sugars more than 200 add 8 units. 01/21/11  Yes Gordy Savers, MD  metolazone (ZAROXOLYN) 2.5 MG tablet Take one tablet by mouth on Mon/Wed/Fri 30 min prior to Furosemide 05/18/11  Yes Marinus Maw, MD  metoprolol  (TOPROL-XL) 50 MG 24 hr tablet Take 25-50 mg by mouth daily. 1/2 tab in the morning and 1 whole tab in the evening   Yes Gordy Savers, MD  potassium chloride SA (K-DUR,KLOR-CON) 20 MEQ tablet TAKE 2 TABLETS BY MOUTH DAILY 11/21/11  Yes Gordy Savers, MD  RABEprazole (ACIPHEX) 20 MG tablet Take 1 tablet (20 mg total) by mouth daily. 09/29/11  Yes Gordy Savers, MD  simvastatin (ZOCOR) 40 MG tablet Take 1 tablet (40 mg total) by mouth at bedtime. 12/08/11  Yes Gordy Savers, MD  tiotropium (SPIRIVA) 18 MCG inhalation capsule Place 18 mcg into inhaler and inhale daily. 07/22/10 08/06/12 Yes Gordy Savers, MD  traMADol (ULTRAM) 50 MG tablet Take 1 tablet (50 mg total) by mouth every 6 (six) hours as needed for pain. 11/09/11  Yes Gordy Savers, MD  warfarin (COUMADIN) 5 MG tablet Take 2.5 mg by mouth daily.    Yes Historical Provider, MD  temazepam (RESTORIL) 30 MG capsule TAKE ONE CAPSULE BY MOUTH EVERY DAY AT BEDTIME AS NEEDED 03/22/2012   Gordy Savers, MD   Allergies Allergies  Allergen Reactions  . Aspirin     REACTION: Swelling, trouble breathing  . Tetracycline Hcl     REACTION: Swelling, trouble breathing    Family History Family History  Problem Relation Age of Onset  . Lung cancer Mother     and aunt  . Heart attack Father   . Diabetes Sister     and aunt  . Colon cancer Neg Hx    Social History  reports that he quit smoking about 6 years ago. His smoking use included Cigarettes. He has a 50 pack-year smoking history. He has never used smokeless tobacco. He reports that he does not drink alcohol or use illicit drugs.  Review Of Systems:  Unattainable, obtunded and intubated.  Events Since Admission: Too much to list prior to ICU transfer. 12/29 Cardiac arrest.  Current Status: Critical  Vital Signs: Temp:  [97.1 F (36.2 C)-98.5 F (36.9 C)] 98.3 F (36.8 C) (12/29 0600) Pulse Rate:  [80-85] 80  (12/29 1132) Resp:  [18-22] 22  (12/29  0600) BP: (89-149)/(49-64) 149/49 mmHg (12/29 1120) SpO2:  [91 %-96 %] 91 % (12/29 0600) FiO2 (%):  [100 %] 100 % (12/29 1132) Weight:  [77.5 kg (170 lb 13.7 oz)] 77.5 kg (170 lb 13.7 oz) (12/28 2152)  Physical Examination: General:  Chronically ill appearing male, much older than stated age. Neuro:  Obtunded, does not move any ext even to pain. HEENT:  [place]/AT, PERRL, EOM-I, -LAN and MMM. Neck:  Supple, -LAN and -thyromegally. Cardiovascular:  RRR, Nl S1/S2, -M/R/G. Lungs:  Coarse BS diffusely and crackles diffusely. Abdomen:  Soft, distended, ascites, and +BS. Musculoskeletal:  2+ edema bilaterally. Skin:  Multiple bruises scattered throughout.  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 Type II  CORONARY ARTERY DISEASE  Atrial fibrillation  COPD  Hypokalemia  Hypoglycemia  Acute on chronic combined systolic and  diastolic CHF (congestive heart failure)  UTI (lower urinary tract infection)  Stage III chronic kidney disease  Lower extremity edema  Microcytic anemia  Hyperkalemia  Pleural effusion, right  Urinary retention with incomplete bladder emptying  Acute on chronic renal failure  Hyponatremia   ASSESSMENT AND PLAN  PULMONARY No results found for this basename: PHART:5,PCO2:5,PCO2ART:5,PO2ART:5,HCO3:5,O2SAT:5 in the last 168 hours Ventilator Settings: Vent Mode:  [-] PRVC FiO2 (%):  [100 %] 100 % Set Rate:  [18 bmp] 18 bmp Vt Set:  [600 mL] 600 mL PEEP:  [5 cmH20] 5 cmH20 Plateau Pressure:  [25 cmH20] 25 cmH20 CXR:  Pending ETT:  12/29>>>  A:  Respiratory failure post PEA arrest likely related to narcotic use. P:   Full vent support F/U ABG. CXR and ABG in AM. Abx per ID section, ?aspiration.  CARDIOVASCULAR  Lab 03/14/12 0525  TROPONINI <0.30  LATICACIDVEN --  PROBNP --   ECG:  Pending Lines: R Diatek catheter  L IJ TLC 12/29>>>  R radial a-line 12/29>>  A: Hypotension, ?cardiogenic vs septic. P:  CVP  to differentiate cause of hypotension. Treat as septic shock. Cardiology to be called back to evaluate patient. Repeat echo or not will be deferred to cards unlikely to be a tamponade but will defer to cards.  RENAL  Lab 03/19/12 1000 02/22/2012 0857 03/17/12 0410 03/16/12 0500 03/15/12 0405  NA 134* 131* 128* 127* 130*  K 4.5 4.2 -- -- --  CL 98 94* 91* 91* 96  CO2 28 26 26 26 28   BUN 26* 28* 49* 37* 24*  CREATININE 2.18* 2.41* 3.89* 3.24* 2.36*  CALCIUM 8.1* 8.3* 8.8 8.6 8.2*  MG -- -- -- -- --  PHOS 3.6 3.8 5.8* 5.6* 3.7   Intake/Output      12/28 0701 - 12/29 0700 12/29 0701 - 12/30 0700   P.O. 600    I.V. (mL/kg) 200 (2.6)    Total Intake(mL/kg) 800 (10.3)    Urine (mL/kg/hr) 25 (0)    Other 1350    Blood     Total Output 1375    Net -575          Foley:  In place, ?if needed, minimal urine.  A:  Renal failure on HD, ?hepatorenal syndrome. P:   HD per renal, will likely need CVVH. Recheck labs today.  GASTROINTESTINAL  Lab 03/19/12 1000 03/20/2012 0857 03/17/12 0410 03/16/12 0500 03/15/12 0405  AST -- -- 25 25 20   ALT -- -- 14 14 14   ALKPHOS -- -- 137* 120* 95  BILITOT -- -- 0.6 0.5 0.5  PROT -- -- 7.1 7.0 6.5  ALBUMIN 2.0* 2.0* 2.1* 2.2* 2.1*    A:  Hepatic failure due to etoh abuse. P:   Place OGT. Start TF. F/U LFT.  HEMATOLOGIC  Lab 03/20/12 0825 03/19/12 1000 03/02/2012 0852 03/17/12 0410 03/16/12 0500  HGB 9.9* 9.5* 9.6* 9.7* 9.8*  HCT 34.3* 31.2* 31.4* 32.5* 33.5*  PLT 132* 140* 145* 154 150  INR -- -- 1.63* -- --  APTT -- -- -- -- --   A:  Liver failure. P:  Check Coags now and in AM. SCDs until head CT is negative.  INFECTIOUS  Lab 03/20/12 0825 03/19/12 1000 02/25/2012 0852 03/17/12 0410 03/16/12 0500  WBC 7.9 8.2 7.6 8.8 8.8  PROCALCITON -- -- -- -- --   Cultures: Blood 12/29>>> Urine 12/29>>> Sputum 12/29>>> Antibiotics: Vanc 12/29>>> Zosyn 12/29>>> Flagyl 12/29>>>  A:  ?aspiration PNA. P:   Sepsis  protocol but modified to  avoid fluid overload. KVO IVF. Abx as above. F/U on cultures.  ENDOCRINE  Lab 03/20/12 1105 03/20/12 0819 03/20/12 0450 03/20/12 0419 03/20/12 0029  GLUCAP 187* 124* 76 47* 104*   A:  DM   P:   ISS Cortisol level Stress dose steroids.  NEUROLOGIC  A:  Obtunded post code, narcs vs CVA vs anoxia. P:   Head CT ordered. Intermittent sedation if needed.  CC time 90 min.  [name], M.D. Pulmonary and Critical Care Medicine [facility] Pager: [phone]  03/20/2012, 12:10 PM

## 2012-03-20 NOTE — Procedures (Signed)
Central Venous Catheter Insertion Procedure Note [name] [id number] [birth year]  Procedure: Insertion of Central Venous Catheter Indications: Assessment of intravascular volume and Drug and/or fluid administration  Procedure Details Consent: Unable to obtain consent because of emergent medical necessity. Time Out: Verified patient identification, verified procedure, site/side was marked, verified correct patient position, special equipment/implants available, medications/allergies/relevent history reviewed, required imaging and test results available.  Performed  Maximum sterile technique was used including antiseptics, cap, gloves, gown, hand hygiene, mask and sheet. Skin prep: Chlorhexidine; local anesthetic administered A antimicrobial bonded/coated triple lumen catheter was placed in the left internal jugular vein using the Seldinger technique.  Evaluation Blood flow good Complications: No apparent complications Patient did tolerate procedure well. Chest X-ray ordered to verify placement.  CXR: pending.  U/S used in placement.  YACOUB,WESAM 03/20/2012, 12:34 PM

## 2012-03-20 NOTE — Procedures (Signed)
Arterial Catheter Insertion Procedure Note [name] [id number] 09/10/1945  Procedure: Insertion of Arterial Catheter  Indications: Blood pressure monitoring  Procedure Details Consent: Unable to obtain consent because of emergent medical necessity. Time Out: Verified patient identification, verified procedure, site/side was marked, verified correct patient position, special equipment/implants available, medications/allergies/relevent history reviewed, required imaging and test results available.  Performed  Maximum sterile technique was used including antiseptics, cap, gloves, gown, hand hygiene, mask and sheet. Skin prep: Chlorhexidine; local anesthetic administered 20 gauge catheter was inserted into right radial artery using the Seldinger technique.  Evaluation Blood flow good; BP tracing good. Complications: No apparent complications.   [name] 03/20/2012

## 2012-03-20 NOTE — Progress Notes (Addendum)
Patient ID: [name], male   DOB: Dec 02, 1945, 66 y.o.   MRN: [id number] S:no new complaints, remains lethargic but arousable O:BP 99/63  Pulse 83  Temp 98.3 F (36.8 C) (Axillary)  Resp 22  Ht 5\' 5"  (1.651 m)  Wt 77.5 kg (170 lb 13.7 oz)  BMI 28.43 kg/m2  SpO2 91%  Intake/Output Summary (Last 24 hours) at 03/20/12 0950 Last data filed at 03/19/12 1800  Gross per 24 hour  Intake    800 ml  Output   1375 ml  Net   -575 ml   Intake/Output: I/O last 3 completed shifts: In: 1640 [P.O.:1440; I.V.:200] Out: 1375 [Urine:25; Other:1350]  Intake/Output this shift:    Weight change: 0 kg (0 lb) YQM:VHQIONGEX but arousable CVS:no rub Resp:occ rhonchi BMW:UXLKGM Ext:1+EDEMA   Lab 03/19/12 1000 02/28/2012 0857 03/17/12 0410 03/16/12 0500 03/15/12 0405 03/14/12 0345  NA 134* 131* 128* 127* 130* 121*  K 4.5 4.2 5.7* 5.3* 4.9 5.1  CL 98 94* 91* 91* 96 88*  CO2 28 26 26 26 28 24   GLUCOSE 131* 102* 105* 110* 112* 59*  BUN 26* 28* 49* 37* 24* 41*  CREATININE 2.18* 2.41* 3.89* 3.24* 2.36* 3.08*  ALBUMIN 2.0* 2.0* 2.1* 2.2* 2.1* 2.2*  CALCIUM 8.1* 8.3* 8.8 8.6 8.2* 8.2*  PHOS 3.6 3.8 5.8* 5.6* 3.7 5.6*  AST -- -- 25 25 20  --  ALT -- -- 14 14 14  --   Liver Function Tests:  Lab 03/19/12 1000 03/09/2012 0857 03/17/12 0410 03/16/12 0500 03/15/12 0405  AST -- -- 25 25 20   ALT -- -- 14 14 14   ALKPHOS -- -- 137* 120* 95  BILITOT -- -- 0.6 0.5 0.5  PROT -- -- 7.1 7.0 6.5  ALBUMIN 2.0* 2.0* 2.1* -- --   No results found for this basename: LIPASE:3,AMYLASE:3 in the last 168 hours No results found for this basename: AMMONIA:3 in the last 168 hours CBC:  Lab 03/20/12 0825 03/19/12 1000 03/17/2012 0852 03/17/12 0410 03/16/12 0500  WBC 7.9 8.2 7.6 -- --  NEUTROABS -- -- -- -- --  HGB 9.9* 9.5* 9.6* -- --  HCT 34.3* 31.2* 31.4* -- --  MCV 83.3 80.8 80.3 81.0 83.5  PLT 132* 140* 145* -- --   Cardiac Enzymes:  Lab 03/14/12 0525  CKTOTAL 40  CKMB 5.5*  CKMBINDEX --  TROPONINI <0.30     CBG:  Lab 03/20/12 0819 03/20/12 0450 03/20/12 0419 03/20/12 0029 03/19/12 2123  GLUCAP 124* 76 47* 104* 170*    Iron Studies: No results found for this basename: IRON,TIBC,TRANSFERRIN,FERRITIN in the last 72 hours Studies/Results: Dg Chest Port 1 View  03/19/2012  *RADIOLOGY REPORT*  Clinical Data: Hemodialysis catheter placement.  PORTABLE CHEST - 1 VIEW  Comparison: 03/17/2012  Findings: Right dialysis catheter is in place with the tips in the upright atrium. No pneumothorax.  Cardiomegaly with vascular congestion.  Suspect mild interstitial edema.  Bibasilar atelectasis or infiltrates and small effusions are unchanged.  The left pacer is unchanged.  IMPRESSION: Right hemodialysis catheter tips in the upper right atrium.  Otherwise no significant change.   Original Report Authenticated By: Charlett Nose, M.D.    Dg Fluoro Guide Cv Line-no Report  03/20/2012  CLINICAL DATA: dialysis catheter   FLOURO GUIDE CV LINE  Fluoroscopy was utilized by the requesting physician.  No radiographic  interpretation.        . budesonide-formoterol  2 puff Inhalation BID  . calcium acetate  667 mg Oral TID  WC  . darbepoetin (ARANESP) injection - DIALYSIS  40 mcg Intravenous Q Fri-HD  . doxercalciferol  1 mcg Intravenous Q M,W,F-HD  . feeding supplement  1 Container Oral Q24H  . gabapentin  100 mg Oral TID  . insulin aspart  0-24 Units Subcutaneous Q4H  . metoprolol succinate  25 mg Oral QHS  . multivitamin  1 tablet Oral QHS  . pantoprazole  40 mg Oral Daily  . polyethylene glycol  17 g Oral Daily  . senna-docusate  1 tablet Oral BID  . simvastatin  40 mg Oral QHS  . sodium chloride  3 mL Intravenous Q12H  . Tamsulosin HCl  0.4 mg Oral QPC supper  . tiotropium  18 mcg Inhalation Daily    BMET    Component Value Date/Time   NA 134* 03/19/2012 1000   K 4.5 03/19/2012 1000   CL 98 03/19/2012 1000   CO2 28 03/19/2012 1000   GLUCOSE 131* 03/19/2012 1000   GLUCOSE 373* 03/26/2006 1049   BUN  26* 03/19/2012 1000   CREATININE 2.18* 03/19/2012 1000   CREATININE 1.91* 03/13/2011 0000   CALCIUM 8.1* 03/19/2012 1000   GFRNONAA 30* 03/19/2012 1000   GFRAA 35* 03/19/2012 1000   CBC    Component Value Date/Time   WBC 7.9 03/20/2012 0825   RBC 4.12* 03/20/2012 0825   HGB 9.9* 03/20/2012 0825   HCT 34.3* 03/20/2012 0825   PLT 132* 03/20/2012 0825   MCV 83.3 03/20/2012 0825   MCH 24.0* 03/20/2012 0825   MCHC 28.9* 03/20/2012 0825   RDW 20.8* 03/20/2012 0825   LYMPHSABS 0.5* 03/09/2012 1412   MONOABS 1.0 03/09/2012 1412   EOSABS 0.2 03/09/2012 1412   BASOSABS 0.0 03/09/2012 1412    Assessment/Plan:  1. AKI/CKD w/o recovery and not responding to lasix. Has required intermittent HD over the last several weeks. Slight increase in UOP but still remains oliguric. Will plan for HD on reg basis and hope he starts to regain renal function. S/P HD yesterday.  Cont with TTS but holiday schedule will have HD tomorrow  2. Anemia epo/fe  3. Hpth Vit D  4. CM with R HF ascites, try to lessen  5. Pericard effusion s/p window per CVS and cards  6. Malnutrition. suppl  7. COPD severe  8. Vascular access- s/p PC and AVF per VVS  9. Diarrhea- agree with r/o for C Diff 10. Dispo- per primary svc.  Pt is not appropriate for outpt HD at this time as he is not able to sit in chair.  Still very weak and will need PT/OT  [name] A  Events of code blue noted. Pt was at baseline prior to episode and likely aspirated.  Agree with current therapy and is off pressors and IVF's.  Currently on the vent with 100% O2 sats with 100%FIO2.  Await CT of head and stabilization.  For HD tomorrow but possible tonight if hypoxia becomes an issue.   Will discuss with Dr. Allena Katz who is on call for [place] this evening.

## 2012-03-20 NOTE — Progress Notes (Signed)
Attempted to call family Bonita Quin) to notify of pt's transfer to ICU. Unable to reach family. Message left to call RN.

## 2012-03-20 NOTE — Consult Note (Signed)
Cardiology Consult Note  Admit date: 03/11/2012 Name: [name] 66 y.o.  male DOB:  08-May-1945 MRN:  [id number]  Today's date:  03/20/2012  Referring Physician:    Critical Care Medicine Primary Physician:    Dr. Juanita Laster Reason for Consultation:    Previous cardiac arrest earlier today  IMPRESSIONS: 1. PEA cardiac arrest occurred earlier today likely related to a respiratory event rather than a primary cardiac event 2. 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 Chronic atrial fibrillation with previous AV nodal ablation and biventricular pacemaker 4. COPD with chronic respiratory failure on home oxygen 5. Acute renal failure 6. Chronic pulmonary hypertension ` RECOMMENDATION: I would obtain an echocardiogram in the morning. I think it is unlikely that this would be due to pericardial effusion tinnitus his blood pressure has been well maintained. Serial enzymes to be obtained. Cardiology will follow along with you.  HISTORY: This 66 year-old male has a complicated past history with chronic systolic heart failure and some coronary artery disease previously. He also has chronic atrial fibrillation with a previous AV nodal ablation and biventricular pacemaker. He has known COPD with chronic respiratory failure. He had a pericardial window performed on December 20. He hasn't seen cardiology since then. Vascular surgery performed  A right dietary catheter and placed a left radiocephalic AV fistula on the 27th. He was noted to be somewhat lethargic this morning and the nurse found him on rounds unresponsive with no pulse and CPR was started. After 12 minutes of CPR and some epinephrine he regained a pulse and blood pressure was moved to the unit.  At 10:00 this evening, the physician's assistant notified me that a cardiology consultation has been requested. The patient was briefly on levophed  earlier today and has remained stable with a  reasonable blood pressure. He is currently unarousable.   Past Medical History  Diagnosis Date  . ALCOHOL ABUSE, HX OF 04/30/2008  . 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 Atrial flutter 09/20/2006  . 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 Chronic systolic heart failure 09/20/2006    previous EF 25%;  Echocardiogram 12/10/10: Mild-moderate LVH, EF 50-55%, mild MR, moderate LAE, moderate RAE, mild RVE with moderately reduced RV SF, PASP 50, small pericardial effusion  . COPD 12/17/2006  . CORONARY ARTERY DISEASE 09/20/2006    s/p stent to OM;  catheterization 12/06: Mid to distal LAD 30%, D2 20%, proximal circumflex 30%, OM 2 stents patent, ostial RCA 50%, mid RCA 40%, distal RCA multiple 40%  . 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 DYSPEPSIA&OTHER [facility] DISORDERS FUNCTION STOMACH 04/04/2009  . HYPERTENSION 12/17/2006  . OBESITY 04/30/2008  . HLD (hyperlipidemia) 09/20/2006  . OTITIS EXTERNA, CHRONIC NEC 12/28/2006  . PERSONAL HX COLONIC POLYPS 05/02/2009  . PSORIASIS 04/30/2008  . CKD (chronic kidney disease) 04/30/2008    creatinine 2.2 range      Past Surgical History  Procedure Date  . Cardiac defibrillator placement   . Angioplasty   . Hernia repair     bilat  . Cardiac catheterization   . Coronary angioplasty     Stents placed  . Transthoracic echocardiogram 2005/2006/2008/2011  . Subxyphoid pericardial window 03/22/12    Procedure: SUBXYPHOID PERICARDIAL WINDOW;  Surgeon: Kerin Perna, MD;  Location: [facility] OR;  Service: Thoracic;  Laterality: N/A;  Would like to follow in OR 17  .  Chest tube insertion 03/15/2012    Procedure: CHEST TUBE INSERTION;  Surgeon: Kerin Perna, MD;  Location: [facility] OR;  Service: Thoracic;  Laterality: Right;     Allergies:  is allergic to aspirin and tetracycline hcl.   Medications: Prior to Admission medications   Medication Sig Start Date End Date  Taking? Authorizing Provider  albuterol (PROAIR HFA) 108 (90 BASE) MCG/ACT inhaler Inhale 2 puffs into the lungs every 6 (six) hours as needed. For shortness of breath 03/16/11  Yes Gordy Savers, MD  budesonide-formoterol [facility]) 160-4.5 MCG/ACT inhaler Inhale 2 puffs into the lungs 2 (two) times daily. 08/28/11 08/27/12 Yes Barbaraann Share, MD  CO-ENZYME Q-10 PO Take 1 capsule by mouth daily.   Yes Historical Provider, MD  furosemide (LASIX) 80 MG tablet Take 80 mg by mouth 2 (two) times daily. 04/08/11  Yes Beatrice Lecher, PA  gabapentin (NEURONTIN) 100 MG capsule Take 1 capsule (100 mg total) by mouth 3 (three) times daily. 01/08/12  Yes Gordy Savers, MD  hydrocortisone cream 1 % Apply to affected area 2 times daily 07/19/11 07/18/12 Yes Raeford Razor, MD  insulin glargine (LANTUS) 100 UNIT/ML injection 30 units at bedtime 01/08/12  Yes Gordy Savers, MD  insulin lispro (HUMALOG) 100 UNIT/ML injection Inject 20 Units into the skin 3 (three) times daily before meals. Inject 20 units before each meal - if blood sugars more than 200 add 8 units. 01/21/11  Yes Gordy Savers, MD  metolazone (ZAROXOLYN) 2.5 MG tablet Take one tablet by mouth on Mon/Wed/Fri 30 min prior to Furosemide 05/18/11  Yes Marinus Maw, MD  metoprolol (TOPROL-XL) 50 MG 24 hr tablet Take 25-50 mg by mouth daily. 1/2 tab in the morning and 1 whole tab in the evening   Yes Gordy Savers, MD  potassium chloride SA (K-DUR,KLOR-CON) 20 MEQ tablet TAKE 2 TABLETS BY MOUTH DAILY 11/21/11  Yes Gordy Savers, MD  RABEprazole (ACIPHEX) 20 MG tablet Take 1 tablet (20 mg total) by mouth daily. 09/29/11  Yes Gordy Savers, MD  simvastatin (ZOCOR) 40 MG tablet Take 1 tablet (40 mg total) by mouth at bedtime. 12/08/11  Yes Gordy Savers, MD  tiotropium (SPIRIVA) 18 MCG inhalation capsule Place 18 mcg into inhaler and inhale daily. 07/22/10 08/06/12 Yes Gordy Savers, MD  traMADol (ULTRAM) 50 MG tablet  Take 1 tablet (50 mg total) by mouth every 6 (six) hours as needed for pain. 11/09/11  Yes Gordy Savers, MD  warfarin (COUMADIN) 5 MG tablet Take 2.5 mg by mouth daily.    Yes Historical Provider, MD  temazepam (RESTORIL) 30 MG capsule TAKE ONE CAPSULE BY MOUTH EVERY DAY AT BEDTIME AS NEEDED 03/17/2012   Gordy Savers, MD    Family History: No family status information on file.    Social History:   reports that he quit smoking about 6 years ago. His smoking use included Cigarettes. He has a 50 pack-year smoking history. He has never used smokeless tobacco. He reports that he does not drink alcohol or use illicit drugs.   History   Social History Narrative  . No narrative on file    Review of Systems: Not obtainable  Physical Exam: BP 152/70  Pulse 79  Temp 97.5 F (36.4 C) (Oral)  Resp 14  Ht 5\' 5"  (1.651 m)  Wt 77.5 kg (170 lb 13.7 oz)  BMI 28.43 kg/m2  SpO2 100%  General appearance: Obtunded white male who is  unresponsive currently on the respirator Eyes: Pupils appear mid position and are sluggish, funduscopic not examined Neck: no adenopathy, no carotid bruit, no JVD and supple, symmetrical, trachea midline Lungs: clear to auscultation bilaterally Heart: regular rate and rhythm, S1, S2 normal, no murmur, click, rub or gallop Abdomen: soft, non-tender; bowel sounds normal; no masses,  no organomegaly Rectal: deferred Pulses: 2+ and symmetric Neurologic: He is obtunded, no spontaneous movement, unresponsive to voice  Labs: CBC  Basename 03/20/12 1220  WBC 7.6  RBC 3.84*  HGB 9.2*  HCT 31.8*  PLT 135*  MCV 82.8  MCH 24.0*  MCHC 28.9*  RDW 20.9*  LYMPHSABS --  MONOABS --  EOSABS --  BASOSABS --   CMP   Basename 03/20/12 1220  NA 132*  K 4.1  CL 96  CO2 22  GLUCOSE 147*  BUN 34*  CREATININE 3.23*  CALCIUM 7.9*  PROT 6.2  ALBUMIN 1.8*  AST 27  ALT 7  ALKPHOS 147*  BILITOT 0.5  GFRNONAA 19*  GFRAA 21*   BNP (last 3  results)  Basename 02/29/12 0505 02/26/12 0105 04/07/11 0934  PROBNP 8263.0* 8685.0* 205.0*   Cardiac Panel (last 3 results)  Basename 03/20/12 1220  CKTOTAL --  CKMB --  TROPONINI 0.30*  RELINDX --     EKG: Underlying atrial fibrillation, paced rhythm  Signed:  W. Ashley Royalty MD [facility]   Cardiology Consultant  03/20/2012, 11:48 PM

## 2012-03-21 ENCOUNTER — Other Ambulatory Visit (HOSPITAL_COMMUNITY): Payer: Medicare Other

## 2012-03-21 ENCOUNTER — Other Ambulatory Visit: Payer: Self-pay | Admitting: *Deleted

## 2012-03-21 ENCOUNTER — Encounter (HOSPITAL_COMMUNITY): Payer: Self-pay | Admitting: Vascular Surgery

## 2012-03-21 ENCOUNTER — Telehealth: Payer: Self-pay | Admitting: Vascular Surgery

## 2012-03-21 ENCOUNTER — Inpatient Hospital Stay (HOSPITAL_COMMUNITY): Payer: Medicare Other

## 2012-03-21 DIAGNOSIS — I319 Disease of pericardium, unspecified: Secondary | ICD-10-CM

## 2012-03-21 DIAGNOSIS — J9600 Acute respiratory failure, unspecified whether with hypoxia or hypercapnia: Secondary | ICD-10-CM

## 2012-03-21 DIAGNOSIS — Z4931 Encounter for adequacy testing for hemodialysis: Secondary | ICD-10-CM

## 2012-03-21 DIAGNOSIS — I469 Cardiac arrest, cause unspecified: Secondary | ICD-10-CM

## 2012-03-21 DIAGNOSIS — N186 End stage renal disease: Secondary | ICD-10-CM

## 2012-03-21 LAB — BASIC METABOLIC PANEL
BUN: 46 mg/dL — ABNORMAL HIGH (ref 6–23)
CO2: 23 mEq/L (ref 19–32)
Chloride: 98 mEq/L (ref 96–112)
GFR calc Af Amer: 19 mL/min — ABNORMAL LOW (ref >90)
Glucose, Bld: 136 mg/dL — ABNORMAL HIGH (ref 70–99)
Potassium: 4.7 mEq/L (ref 3.5–5.1)

## 2012-03-21 LAB — HEPATIC FUNCTION PANEL
ALT: 8 U/L (ref 0–53)
AST: 29 U/L (ref 0–37)
Albumin: 2 g/dL — ABNORMAL LOW (ref 3.5–5.2)
Alkaline Phosphatase: 164 U/L — ABNORMAL HIGH (ref 39–117)
Total Protein: 6.9 g/dL (ref 6.0–8.3)

## 2012-03-21 LAB — POCT I-STAT 3, ART BLOOD GAS (G3+)
Bicarbonate: 22.2 mEq/L (ref 20.0–24.0)
Patient temperature: 97.9
TCO2: 23 mmol/L (ref 0–100)
pCO2 arterial: 38.6 mmHg (ref 35.0–45.0)
pH, Arterial: 7.366 (ref 7.350–7.450)

## 2012-03-21 LAB — GLUCOSE, CAPILLARY
Glucose-Capillary: 117 mg/dL — ABNORMAL HIGH (ref 70–99)
Glucose-Capillary: 111 mg/dL — ABNORMAL HIGH (ref 70–99)

## 2012-03-21 LAB — URINE CULTURE: Colony Count: 80000

## 2012-03-21 LAB — IRON AND TIBC
Iron: 13 ug/dL — ABNORMAL LOW (ref 42–135)
TIBC: 154 ug/dL — ABNORMAL LOW (ref 215–435)
UIBC: 141 ug/dL (ref 125–400)

## 2012-03-21 LAB — CBC
HCT: 33.6 % — ABNORMAL LOW (ref 39.0–52.0)
Hemoglobin: 9.9 g/dL — ABNORMAL LOW (ref 13.0–17.0)
RBC: 4.23 MIL/uL (ref 4.22–5.81)
WBC: 7.1 10*3/uL (ref 4.0–10.5)

## 2012-03-21 LAB — CLOSTRIDIUM DIFFICILE BY PCR: Toxigenic C. Difficile by PCR: POSITIVE — AB

## 2012-03-21 LAB — PROTIME-INR: Prothrombin Time: 18.5 seconds — ABNORMAL HIGH (ref 11.6–15.2)

## 2012-03-21 MED ORDER — VANCOMYCIN HCL IN DEXTROSE 1-5 GM/200ML-% IV SOLN
1000.0000 mg | INTRAVENOUS | Status: AC
  Administered 2012-03-21: 1000 mg via INTRAVENOUS
  Filled 2012-03-21 (×2): qty 200

## 2012-03-21 MED ORDER — FAMOTIDINE 40 MG/5ML PO SUSR
40.0000 mg | Freq: Two times a day (BID) | ORAL | Status: DC
  Administered 2012-03-21: 40 mg via ORAL
  Filled 2012-03-21 (×3): qty 5

## 2012-03-21 MED ORDER — VANCOMYCIN 50 MG/ML ORAL SOLUTION
125.0000 mg | Freq: Four times a day (QID) | ORAL | Status: DC
  Administered 2012-03-21 – 2012-03-29 (×31): 125 mg via ORAL
  Filled 2012-03-21 (×35): qty 2.5

## 2012-03-21 MED ORDER — SODIUM CHLORIDE 0.9 % IV SOLN
1000.0000 mg | Freq: Two times a day (BID) | INTRAVENOUS | Status: DC
  Filled 2012-03-21: qty 10

## 2012-03-21 MED ORDER — SODIUM CHLORIDE 0.9 % IV SOLN
500.0000 mg | INTRAVENOUS | Status: DC | PRN
  Filled 2012-03-21: qty 5

## 2012-03-21 MED ORDER — METOPROLOL TARTRATE 25 MG/10 ML ORAL SUSPENSION
25.0000 mg | Freq: Two times a day (BID) | ORAL | Status: DC
  Administered 2012-03-22 – 2012-03-29 (×13): 25 mg
  Filled 2012-03-21 (×18): qty 10

## 2012-03-21 MED ORDER — LIDOCAINE HCL (PF) 1 % IJ SOLN: 5.0000 mL | INTRAMUSCULAR | Status: DC | PRN

## 2012-03-21 MED ORDER — SODIUM CHLORIDE 0.9 % IV SOLN
1500.0000 mg | INTRAVENOUS | Status: AC
  Administered 2012-03-21: 1500 mg via INTRAVENOUS
  Filled 2012-03-21: qty 15

## 2012-03-21 MED ORDER — PENTAFLUOROPROP-TETRAFLUOROETH EX AERO: 1.0000 "application " | INHALATION_SPRAY | CUTANEOUS | Status: DC | PRN

## 2012-03-21 MED ORDER — LEVETIRACETAM 500 MG/5ML IV SOLN
1000.0000 mg | INTRAVENOUS | Status: DC
  Administered 2012-03-22 – 2012-03-24 (×3): 1000 mg via INTRAVENOUS
  Filled 2012-03-21 (×4): qty 10

## 2012-03-21 MED ORDER — SODIUM CHLORIDE 0.9 % IV SOLN: 100.0000 mL | INTRAVENOUS | Status: DC | PRN

## 2012-03-21 MED ORDER — ALTEPLASE 2 MG IJ SOLR
2.0000 mg | Freq: Once | INTRAMUSCULAR | Status: AC | PRN
  Filled 2012-03-21: qty 2

## 2012-03-21 MED ORDER — HEPARIN SODIUM (PORCINE) 5000 UNIT/ML IJ SOLN
5000.0000 [IU] | Freq: Three times a day (TID) | INTRAMUSCULAR | Status: DC
  Administered 2012-03-21 – 2012-03-26 (×16): 5000 [IU] via SUBCUTANEOUS
  Filled 2012-03-21 (×18): qty 1

## 2012-03-21 MED ORDER — NEPRO/CARBSTEADY PO LIQD
1000.0000 mL | ORAL | Status: DC
  Administered 2012-03-21 – 2012-03-28 (×8): 1000 mL
  Filled 2012-03-21 (×13): qty 1000

## 2012-03-21 MED ORDER — HEPARIN SODIUM (PORCINE) 1000 UNIT/ML DIALYSIS: 1000.0000 [IU] | INTRAMUSCULAR | Status: DC | PRN

## 2012-03-21 MED ORDER — SODIUM CHLORIDE 0.9 % IV SOLN
750.0000 mg | INTRAVENOUS | Status: DC | PRN
  Filled 2012-03-21: qty 7.5

## 2012-03-21 MED ORDER — NEPRO/CARBSTEADY PO LIQD
237.0000 mL | ORAL | Status: DC | PRN
  Filled 2012-03-21: qty 237

## 2012-03-21 MED ORDER — LIDOCAINE-PRILOCAINE 2.5-2.5 % EX CREA
1.0000 "application " | TOPICAL_CREAM | CUTANEOUS | Status: DC | PRN
  Filled 2012-03-21: qty 5

## 2012-03-21 MED ORDER — DOXERCALCIFEROL 4 MCG/2ML IV SOLN
INTRAVENOUS | Status: AC
  Administered 2012-03-21: 1 ug via INTRAVENOUS
  Filled 2012-03-21: qty 2

## 2012-03-21 MED ORDER — HEPARIN SODIUM (PORCINE) 1000 UNIT/ML DIALYSIS
20.0000 [IU]/kg | INTRAMUSCULAR | Status: DC | PRN
  Administered 2012-03-21: 1600 [IU] via INTRAVENOUS_CENTRAL

## 2012-03-21 MED ORDER — PRO-STAT SUGAR FREE PO LIQD
30.0000 mL | Freq: Two times a day (BID) | ORAL | Status: DC
  Administered 2012-03-21 – 2012-03-28 (×16): 30 mL
  Filled 2012-03-21 (×20): qty 30

## 2012-03-21 MED ORDER — PANTOPRAZOLE SODIUM 40 MG PO PACK
40.0000 mg | PACK | Freq: Every day | ORAL | Status: DC
  Administered 2012-03-21: 40 mg
  Filled 2012-03-21: qty 20

## 2012-03-21 MED ORDER — SODIUM CHLORIDE 0.9 % IV SOLN: 1000.0000 mg | Freq: Two times a day (BID) | INTRAVENOUS | Status: DC

## 2012-03-21 NOTE — Consult Note (Signed)
NEURO HOSPITALIST CONSULT NOTE    Reason for Consult: prognosis post CPR  HPI:                                                                                                                                         From chart as patient is unable to provide.    [name] is an 66 y.o. male with known ETOH abuse, Afib S/P AVN ablation and insertion BiV pacer 12/12, CAD, DM, COPD, CKD, HLD who was admitted to the hospital on December 6th due to SOB. Patient was found to have EF 40-45% (previous EF 25% 12/10/10)and respiratory failure. While hospitalized patient was found to have a pericardial effusion requiring a window, pleural effusion requiring chest tube, renal failure requiring dialysis and Liver failure secondary to ETOH abuse. On 03/20/12 patient suffered from 12 minute PEA arrest thought to be secondary to respiratory failure. Post code, patient remained hypotensive and obtunded. At current time patient remains off sedation, breathing over the ventilator, follows no commands and makes no purposeful movements. 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  Neurology was consulted for prognosis.   Bun/Cr--46/3.58 Glucose as low as 47 03/20/12 C Diff positive UA --large leukocytes and many bacteria (culture pending) on Zosyn CT head obtained yesterday showed no acute changes.   Past Medical History  Diagnosis Date  . ALCOHOL ABUSE, HX OF 04/30/2008  . 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 Atrial flutter 09/20/2006  . 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 Chronic systolic heart failure 09/20/2006    previous EF 25%;  Echocardiogram 12/10/10: Mild-moderate LVH, EF 50-55%, mild MR, moderate LAE, moderate RAE, mild RVE with moderately reduced RV SF, PASP 50, small pericardial effusion  . COPD 12/17/2006  . CORONARY ARTERY DISEASE 09/20/2006    s/p stent to OM;  catheterization  12/06: Mid to distal LAD 30%, D2 20%, proximal circumflex 30%, OM 2 stents patent, ostial RCA 50%, mid RCA 40%, distal RCA multiple 40%  . 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 DYSPEPSIA&OTHER [facility] DISORDERS FUNCTION STOMACH 04/04/2009  . HYPERTENSION 12/17/2006  . OBESITY 04/30/2008  . HLD (hyperlipidemia) 09/20/2006  . OTITIS EXTERNA, CHRONIC NEC 12/28/2006  . PERSONAL HX COLONIC POLYPS 05/02/2009  . PSORIASIS 04/30/2008  . CKD (chronic kidney disease) 04/30/2008    creatinine 2.2 range    Past Surgical History  Procedure Date  . Cardiac defibrillator placement   . Angioplasty   . Hernia repair     bilat  . Cardiac catheterization   . Coronary angioplasty     Stents placed  . Transthoracic echocardiogram 2005/2006/2008/2011  . Subxyphoid pericardial window 03/20/2012  Procedure: SUBXYPHOID PERICARDIAL WINDOW;  Surgeon: Kerin Perna, MD;  Location: [facility] OR;  Service: Thoracic;  Laterality: N/A;  Would like to follow in OR 17  . Chest tube insertion 2012/04/04    Procedure: CHEST TUBE INSERTION;  Surgeon: Kerin Perna, MD;  Location: [facility] OR;  Service: Thoracic;  Laterality: Right;    Family History  Problem Relation Age of Onset  . Lung cancer Mother     and aunt  . Heart attack Father   . Diabetes Sister     and aunt  . Colon cancer Neg Hx     Family History: Unable to be obtained.   Social History:  reports that he quit smoking about 7 years ago. His smoking use included Cigarettes. He has a 50 pack-year smoking history. He has never used smokeless tobacco. He reports that he does not drink alcohol or use illicit drugs.  Allergies  Allergen Reactions  . Aspirin     REACTION: Swelling, trouble breathing  . Tetracycline Hcl     REACTION: Swelling, trouble breathing    MEDICATIONS:                                                                                                                     Scheduled:   . antiseptic  oral rinse  15 mL Mouth Rinse QID  . chlorhexidine  15 mL Mouth Rinse BID  . darbepoetin (ARANESP) injection - DIALYSIS  40 mcg Intravenous Q Fri-HD  . doxercalciferol  1 mcg Intravenous Q M,W,F-HD  . feeding supplement (NEPRO CARB STEADY)  1,000 mL Per Tube Q24H  . feeding supplement  30 mL Per Tube BID  . gabapentin  100 mg Oral TID  . heparin subcutaneous  5,000 Units Subcutaneous Q8H  . hydrocortisone sodium succinate  50 mg Intravenous Q6H  . insulin aspart  0-24 Units Subcutaneous Q4H  . metoprolol succinate  25 mg Oral QHS  . metronidazole  500 mg Intravenous Q8H  . multivitamin  1 tablet Oral QHS  . pantoprazole sodium  40 mg Per Tube Daily  . piperacillin-tazobactam (ZOSYN)  IV  2.25 g Intravenous Q8H  . polyethylene glycol  17 g Oral Daily  . senna-docusate  1 tablet Oral BID  . simvastatin  40 mg Oral QHS  . sodium chloride  25 mL/kg Intravenous Once  . sodium chloride  3 mL Intravenous Q12H  . Tamsulosin HCl  0.4 mg Oral QPC supper  . vancomycin  1,000 mg Intravenous To Hemo     ROS:  History obtained from unobtainable from patient due to mental status   Blood pressure 141/67, pulse 83, temperature 98.2 F (36.8 C), temperature source Oral, resp. rate 20, height 5\' 5"  (1.651 m), weight 80.6 kg (177 lb 11.1 oz), SpO2 100.00%.   Neurologic Examination:                                                                                                       Mental Status: Patient does not respond to verbal stimuli.  Does not respond to deep sternal rub.  Does not follow commands.  No verbalizations noted.  Cranial Nerves: II: patient does not respond confrontation bilaterally, pupils right 2 mm, left 2 mm,and reactive bilaterally. Discs difficult to visualize. III,IV,VI: doll's response present bilaterally.  V,VII: corneal reflex present  bilaterally  VIII: patient does not respond to verbal stimuli IX,X: gag reflex reduced, XI: trapezius strength unable to test bilaterally. Breathing over the vent.  XII: tongue strength unable to test Motor: Extremities with increased tone throughout and active bilateral generalized myoclonus.  No spontaneous movement noted.  No purposeful movements noted. Sensory: Does not respond to noxious stimuli in any extremity. Deep Tendon Reflexes:  1+ bilateral UE and knee jerk.  No AJ bilaterally.  Plantars: equivocal bilaterally Cerebellar: Unable to perform    Lab Results  Component Value Date/Time   CHOL 120 02/08/2011  5:45 AM    Results for orders placed during the hospital encounter of 03/14/2012 (from the past 48 hour(s))  GLUCOSE, CAPILLARY     Status: Abnormal   Collection Time   03/19/12  2:46 PM      Component Value Range Comment   Glucose-Capillary 107 (*) 70 - 99 mg/dL   GLUCOSE, CAPILLARY     Status: Abnormal   Collection Time   03/19/12  4:53 PM      Component Value Range Comment   Glucose-Capillary 189 (*) 70 - 99 mg/dL   GLUCOSE, CAPILLARY     Status: Abnormal   Collection Time   03/19/12  9:23 PM      Component Value Range Comment   Glucose-Capillary 170 (*) 70 - 99 mg/dL    Comment 1 Notify RN     GLUCOSE, CAPILLARY     Status: Abnormal   Collection Time   03/20/12 12:29 AM      Component Value Range Comment   Glucose-Capillary 104 (*) 70 - 99 mg/dL   GLUCOSE, CAPILLARY     Status: Abnormal   Collection Time   03/20/12  4:19 AM      Component Value Range Comment   Glucose-Capillary 47 (*) 70 - 99 mg/dL   GLUCOSE, CAPILLARY     Status: Normal   Collection Time   03/20/12  4:50 AM      Component Value Range Comment   Glucose-Capillary 76  70 - 99 mg/dL   GLUCOSE, CAPILLARY     Status: Abnormal   Collection Time   03/20/12  8:19 AM      Component Value Range Comment   Glucose-Capillary 124 (*) 70 - 99  mg/dL   CBC     Status: Abnormal   Collection Time    03/20/12  8:25 AM      Component Value Range Comment   WBC 7.9  4.0 - 10.5 K/uL    RBC 4.12 (*) 4.22 - 5.81 MIL/uL    Hemoglobin 9.9 (*) 13.0 - 17.0 g/dL    HCT 40.9 (*) 81.1 - 52.0 %    MCV 83.3  78.0 - 100.0 fL    MCH 24.0 (*) 26.0 - 34.0 pg    MCHC 28.9 (*) 30.0 - 36.0 g/dL    RDW 91.4 (*) 78.2 - 15.5 %    Platelets 132 (*) 150 - 400 K/uL   GLUCOSE, CAPILLARY     Status: Abnormal   Collection Time   03/20/12 11:05 AM      Component Value Range Comment   Glucose-Capillary 187 (*) 70 - 99 mg/dL   GLUCOSE, CAPILLARY     Status: Abnormal   Collection Time   03/20/12 11:36 AM      Component Value Range Comment   Glucose-Capillary 146 (*) 70 - 99 mg/dL   CULTURE, BLOOD (ROUTINE X 2)     Status: Normal (Preliminary result)   Collection Time   03/20/12 12:00 PM      Component Value Range Comment   Specimen Description BLOOD CENTRAL LINE      Special Requests BOTTLES DRAWN AEROBIC ONLY 4CC      Culture  Setup Time 03/20/2012 18:44      Culture        Value:        BLOOD CULTURE RECEIVED NO GROWTH TO DATE CULTURE WILL BE HELD FOR 5 DAYS BEFORE ISSUING A FINAL NEGATIVE REPORT   Report Status PENDING     TYPE AND SCREEN     Status: Normal   Collection Time   03/20/12 12:00 PM      Component Value Range Comment   ABO/RH(D) O POS      Antibody Screen NEG      Sample Expiration 03/23/2012     CBC     Status: Abnormal   Collection Time   03/20/12 12:20 PM      Component Value Range Comment   WBC 7.6  4.0 - 10.5 K/uL    RBC 3.84 (*) 4.22 - 5.81 MIL/uL    Hemoglobin 9.2 (*) 13.0 - 17.0 g/dL    HCT 95.6 (*) 21.3 - 52.0 %    MCV 82.8  78.0 - 100.0 fL    MCH 24.0 (*) 26.0 - 34.0 pg    MCHC 28.9 (*) 30.0 - 36.0 g/dL    RDW 08.6 (*) 57.8 - 15.5 %    Platelets 135 (*) 150 - 400 K/uL   COMPREHENSIVE METABOLIC PANEL     Status: Abnormal   Collection Time   03/20/12 12:20 PM      Component Value Range Comment   Sodium 132 (*) 135 - 145 mEq/L    Potassium 4.1  3.5 - 5.1 mEq/L     Chloride 96  96 - 112 mEq/L    CO2 22  19 - 32 mEq/L    Glucose, Bld 147 (*) 70 - 99 mg/dL    BUN 34 (*) 6 - 23 mg/dL    Creatinine, Ser 4.69 (*) 0.50 - 1.35 mg/dL    Calcium 7.9 (*) 8.4 - 10.5 mg/dL    Total Protein 6.2  6.0 - 8.3 g/dL    Albumin 1.8 (*)  3.5 - 5.2 g/dL    AST 27  0 - 37 U/L    ALT 7  0 - 53 U/L    Alkaline Phosphatase 147 (*) 39 - 117 U/L    Total Bilirubin 0.5  0.3 - 1.2 mg/dL    GFR calc non Af Amer 19 (*) >90 mL/min    GFR calc Af Amer 21 (*) >90 mL/min   CORTISOL     Status: Normal   Collection Time   03/20/12 12:20 PM      Component Value Range Comment   Cortisol, Plasma 24.3     PROCALCITONIN     Status: Normal   Collection Time   03/20/12 12:20 PM      Component Value Range Comment   Procalcitonin 0.31     TROPONIN I     Status: Abnormal   Collection Time   03/20/12 12:20 PM      Component Value Range Comment   Troponin I 0.30 (*) <0.30 ng/mL   PROTIME-INR     Status: Abnormal   Collection Time   03/20/12 12:20 PM      Component Value Range Comment   Prothrombin Time 19.0 (*) 11.6 - 15.2 seconds    INR 1.65 (*) 0.00 - 1.49   APTT     Status: Abnormal   Collection Time   03/20/12 12:20 PM      Component Value Range Comment   aPTT 52 (*) 24 - 37 seconds   FIBRINOGEN     Status: Abnormal   Collection Time   03/20/12 12:20 PM      Component Value Range Comment   Fibrinogen 476 (*) 204 - 475 mg/dL   LACTIC ACID, PLASMA     Status: Abnormal   Collection Time   03/20/12 12:30 PM      Component Value Range Comment   Lactic Acid, Venous 2.6 (*) 0.5 - 2.2 mmol/L   CULTURE, RESPIRATORY     Status: Normal (Preliminary result)   Collection Time   03/20/12 12:50 PM      Component Value Range Comment   Specimen Description TRACHEAL ASPIRATE      Special Requests Normal      Gram Stain        Value: RARE WBC PRESENT, PREDOMINANTLY PMN     NO SQUAMOUS EPITHELIAL CELLS SEEN     FEW YEAST WITH PSEUDOHYPHAE     RARE GRAM POSITIVE RODS   Culture  Culture reincubated for better growth      Report Status PENDING     CULTURE, BLOOD (ROUTINE X 2)     Status: Normal (Preliminary result)   Collection Time   03/20/12  1:30 PM      Component Value Range Comment   Specimen Description BLOOD A-LINE      Special Requests BOTTLES DRAWN AEROBIC ONLY 8CC      Culture  Setup Time 03/20/2012 18:44      Culture        Value:        BLOOD CULTURE RECEIVED NO GROWTH TO DATE CULTURE WILL BE HELD FOR 5 DAYS BEFORE ISSUING A FINAL NEGATIVE REPORT   Report Status PENDING     POCT I-STAT 3, BLOOD GAS (G3+)     Status: Abnormal   Collection Time   03/20/12  2:22 PM      Component Value Range Comment   pH, Arterial 7.432  7.350 - 7.450    pCO2 arterial 35.1  35.0 -  45.0 mmHg    pO2, Arterial 377.0 (*) 80.0 - 100.0 mmHg    Bicarbonate 23.5  20.0 - 24.0 mEq/L    TCO2 25  0 - 100 mmol/L    O2 Saturation 100.0      Acid-base deficit 1.0  0.0 - 2.0 mmol/L    Patient temperature 97.6 F      Collection site ARTERIAL LINE      Drawn by Operator      Sample type ARTERIAL     CARBOXYHEMOGLOBIN     Status: Abnormal   Collection Time   03/20/12  2:23 PM      Component Value Range Comment   Total hemoglobin 9.4 (*) 13.5 - 18.0 g/dL    O2 Saturation 16.1      Carboxyhemoglobin 1.5  0.5 - 1.5 %    Methemoglobin 1.2  0.0 - 1.5 %   GLUCOSE, CAPILLARY     Status: Abnormal   Collection Time   03/20/12  4:06 PM      Component Value Range Comment   Glucose-Capillary 117 (*) 70 - 99 mg/dL   URINALYSIS, ROUTINE W REFLEX MICROSCOPIC     Status: Abnormal   Collection Time   03/20/12  6:01 PM      Component Value Range Comment   Color, Urine AMBER (*) YELLOW BIOCHEMICALS MAY BE AFFECTED BY COLOR   APPearance TURBID (*) CLEAR    Specific Gravity, Urine 1.020  1.005 - 1.030    pH 5.0  5.0 - 8.0    Glucose, UA NEGATIVE  NEGATIVE mg/dL    Hgb urine dipstick LARGE (*) NEGATIVE    Bilirubin Urine SMALL (*) NEGATIVE    Ketones, ur 15 (*) NEGATIVE mg/dL    Protein,  ur 096 (*) NEGATIVE mg/dL    Urobilinogen, UA 1.0  0.0 - 1.0 mg/dL    Nitrite NEGATIVE  NEGATIVE    Leukocytes, UA LARGE (*) NEGATIVE   URINE MICROSCOPIC-ADD ON     Status: Abnormal   Collection Time   03/20/12  6:01 PM      Component Value Range Comment   Squamous Epithelial / LPF FEW (*) RARE    WBC, UA TOO NUMEROUS TO COUNT  <3 WBC/hpf    RBC / HPF 11-20  <3 RBC/hpf    Bacteria, UA MANY (*) RARE    Casts GRANULAR CAST (*) NEGATIVE    Urine-Other MANY YEAST   AMORPHOUS URATES/PHOSPHATES  GLUCOSE, CAPILLARY     Status: Abnormal   Collection Time   03/20/12  7:55 PM      Component Value Range Comment   Glucose-Capillary 124 (*) 70 - 99 mg/dL    Comment 1 Notify RN     GLUCOSE, CAPILLARY     Status: Abnormal   Collection Time   03/21/12 12:05 AM      Component Value Range Comment   Glucose-Capillary 124 (*) 70 - 99 mg/dL   CLOSTRIDIUM DIFFICILE BY PCR     Status: Abnormal   Collection Time   03/21/12  4:00 AM      Component Value Range Comment   C difficile by pcr POSITIVE (*) NEGATIVE   GLUCOSE, CAPILLARY     Status: Abnormal   Collection Time   03/21/12  4:24 AM      Component Value Range Comment   Glucose-Capillary 117 (*) 70 - 99 mg/dL   CBC     Status: Abnormal   Collection Time   03/21/12  5:00 AM  Component Value Range Comment   WBC 7.1  4.0 - 10.5 K/uL    RBC 4.23  4.22 - 5.81 MIL/uL    Hemoglobin 9.9 (*) 13.0 - 17.0 g/dL    HCT 40.9 (*) 81.1 - 52.0 %    MCV 79.4  78.0 - 100.0 fL    MCH 23.4 (*) 26.0 - 34.0 pg    MCHC 29.5 (*) 30.0 - 36.0 g/dL    RDW 91.4 (*) 78.2 - 15.5 %    Platelets 152  150 - 400 K/uL   BASIC METABOLIC PANEL     Status: Abnormal   Collection Time   03/21/12  5:00 AM      Component Value Range Comment   Sodium 135  135 - 145 mEq/L    Potassium 4.7  3.5 - 5.1 mEq/L    Chloride 98  96 - 112 mEq/L    CO2 23  19 - 32 mEq/L    Glucose, Bld 136 (*) 70 - 99 mg/dL    BUN 46 (*) 6 - 23 mg/dL    Creatinine, Ser 9.56 (*) 0.50 - 1.35 mg/dL     Calcium 8.6  8.4 - 10.5 mg/dL    GFR calc non Af Amer 16 (*) >90 mL/min    GFR calc Af Amer 19 (*) >90 mL/min   MAGNESIUM     Status: Normal   Collection Time   03/21/12  5:00 AM      Component Value Range Comment   Magnesium 2.4  1.5 - 2.5 mg/dL   PHOSPHORUS     Status: Abnormal   Collection Time   03/21/12  5:00 AM      Component Value Range Comment   Phosphorus 5.4 (*) 2.3 - 4.6 mg/dL   HEPATIC FUNCTION PANEL     Status: Abnormal   Collection Time   03/21/12  5:00 AM      Component Value Range Comment   Total Protein 6.9  6.0 - 8.3 g/dL    Albumin 2.0 (*) 3.5 - 5.2 g/dL    AST 29  0 - 37 U/L    ALT 8  0 - 53 U/L    Alkaline Phosphatase 164 (*) 39 - 117 U/L    Total Bilirubin 0.6  0.3 - 1.2 mg/dL    Bilirubin, Direct 0.3  0.0 - 0.3 mg/dL    Indirect Bilirubin 0.3  0.3 - 0.9 mg/dL   PROTIME-INR     Status: Abnormal   Collection Time   03/21/12  5:00 AM      Component Value Range Comment   Prothrombin Time 18.5 (*) 11.6 - 15.2 seconds    INR 1.59 (*) 0.00 - 1.49   POCT I-STAT 3, BLOOD GAS (G3+)     Status: Abnormal   Collection Time   03/21/12  5:44 AM      Component Value Range Comment   pH, Arterial 7.366  7.350 - 7.450    pCO2 arterial 38.6  35.0 - 45.0 mmHg    pO2, Arterial 130.0 (*) 80.0 - 100.0 mmHg    Bicarbonate 22.2  20.0 - 24.0 mEq/L    TCO2 23  0 - 100 mmol/L    O2 Saturation 99.0      Acid-base deficit 3.0 (*) 0.0 - 2.0 mmol/L    Patient temperature 97.9 F      Collection site RADIAL, ALLEN'S TEST ACCEPTABLE      Sample type ARTERIAL     GLUCOSE, CAPILLARY  Status: Abnormal   Collection Time   03/21/12  7:33 AM      Component Value Range Comment   Glucose-Capillary 111 (*) 70 - 99 mg/dL   GLUCOSE, CAPILLARY     Status: Abnormal   Collection Time   03/21/12 11:46 AM      Component Value Range Comment   Glucose-Capillary 108 (*) 70 - 99 mg/dL     Dg Chest Port 1 View  03/21/2012  *RADIOLOGY REPORT*  Clinical Data: Shortness of breath.   Endotracheal tube placement.  PORTABLE CHEST - 1 VIEW  Comparison: 12/29 and 03/19/2012  Findings: The endotracheal tube is in good position approximately 4 cm above the carina.  Double-lumen central venous catheter and left- sided central venous catheter tips are at the cavoatrial junction. Pacemaker in place. NG tube tip below the diaphragm.  Persistent marked cardiomegaly.  Increasing bilateral pleural effusions.  Pulmonary vascularity has increased.  IMPRESSION: Increasing pleural effusions and increasing pulmonary vascularity.   Original Report Authenticated By: Francene Boyers, M.D.    Dg Chest Portable 1 View  03/20/2012  *RADIOLOGY REPORT*  Clinical Data: Sepsis, line placement  PORTABLE CHEST - 1 VIEW  Comparison: Chest radiograph 02/29/2012 7:15 hours  Findings: Interval placement endotracheal tube positioned 4 cm from carina.  Interval placement of a left central venous line with tip in the distal SVC.  No pneumothorax.  Stable cardiac silhouette which is enlarged.  There is bibasilar air space disease suggesting pulmonary edema.  IMPRESSION:  1.  Interval intubation and placement of left central line without complication. 2.  Cardiomegaly and basilar air space disease suggesting edema.   Original Report Authenticated By: Genevive Bi, M.D.    Ct Portable Head W/o Cm  03/20/2012  *RADIOLOGY REPORT*  Clinical Data: Altered mental status.  CT HEAD WITHOUT CONTRAST  Technique:  Contiguous axial images were obtained from the base of the skull through the vertex without contrast.  Comparison: 05/04/2009.  Findings: Mild age appropriate atrophy.  Patchy chronic small vessel disease throughout the deep white matter.  Small old lacunar infarct in the right basal ganglia. No acute intracranial abnormality.  Specifically, no hemorrhage, hydrocephalus, mass lesion, acute infarction, or significant intracranial injury.  No acute calvarial abnormality.  Visualized paranasal sinuses and mastoids clear.   Orbital soft tissues unremarkable.  IMPRESSION: No acute intracranial abnormality.   Original Report Authenticated By: Charlett Nose, M.D.      Assessment/Plan:  66 YO male with multiple medical problems who recently suffered a 12 minute PEA arrest.  Patient currently showing bilateral myoclonic jerking, intact corneal, pupillary, oculocephalic eye movements and breathing over the vent. Initial CT head showed no acute abnormality however this was obtained within hours after arrest. EEG has been ordered and pending. Although patient has intact cranial nerves and negative initial head CT liklihood patient willreturn to his baseline or independent functioning level is poor given both the presence of prolonged PEA and current generalized myoclonic activity.  Recommendations: 1) Agree with EEG--will start Keppra 750 mg BID with 1,500 mg IV load.  PRN orders for 750 mg IV after dialysis.  2) Repeat CT head in AM 3) Continue to treat underlying infections/metabolic abnormalities  Felicie Morn PA-C [facility] [phone]  03/21/2012, 12:34 PM  I have seen and evaluated the patient. I have reviewed the above note and made appropriate changes.   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 It has responded very well to a keppra load. An EEG to look for background reactivity could be helpful, but would treat the myoclonus symptomatically in the interim.   Ritta Slot, MD [facility] [phone]  If 7pm- 7am, please page neurology on call at [phone].

## 2012-03-21 NOTE — Progress Notes (Signed)
CDS notified for [facility] of 4.  [name] from [facility] returned call.  Advised RN that patient will not be DCD candidate and is not brain dead. RN to call back if brain death testing or withdrawal of care is to be conducted.

## 2012-03-21 NOTE — Telephone Encounter (Signed)
Could not leave message for pt, sent letter, dpm

## 2012-03-21 NOTE — Procedures (Signed)
[facility]  On HD viaR IJ tunneled HD cath BP--132/55 Goal--3-4 L Hgb--9.9   K+--4.7

## 2012-03-21 NOTE — Consult Note (Signed)
Name: [name] MRN: [id number] DOB: [birth year]    LOS: 25  Referring Provider:  [facility] Reason for Referral:  Cardiac arrest and respiratory failure.  PULMONARY / CRITICAL CARE MEDICINE  HPI:  66 year old with history of alcohol abuse who presented to the hospital on December 6th with SOB, was found to have acute on chronic respiratory failure with EF of 40-45 percent who had an extremely complicated hospital course with pericardial effusion requiring a window, pleural effusion requiring a chest tube, renal failure requiring dialysis and liver failure from etoh.  On 12/29 patient presented to the ICU after a 12 minute PEA cardiac arrest that was likely related to respiratory failure ?narcotic use for pain.  Upon presenting to the ICU the patient was hypotensive and obtunded post code.  Events Since Admission: Too much to list prior to ICU transfer. 12/29 Cardiac arrest.  Current Status: Critical  Vital Signs: Temp:  [97.3 F (36.3 C)-99.9 F (37.7 C)] 99.9 F (37.7 C) (12/30 0814) Pulse Rate:  [79-87] 83  (12/30 1200) Resp:  [0-20] 20  (12/30 1200) BP: (89-165)/(27-104) 141/67 mmHg (12/30 0343) SpO2:  [97 %-100 %] 100 % (12/30 1200) Arterial Line BP: (105-159)/(56-75) 144/64 mmHg (12/30 1200) FiO2 (%):  [40 %-100 %] 40 % (12/30 1200) Weight:  [80.6 kg (177 lb 11.1 oz)] 80.6 kg (177 lb 11.1 oz) (12/30 0500)  Physical Examination: General:  Chronically ill appearing male, much older than stated age, myoclonus on exam. Neuro:  Obtunded, does not move any ext even to pain, myoclonus on exam. HEENT:  [place]/AT, PERRL, EOM-I, -LAN and MMM. Neck:  Supple, -LAN and -thyromegally. Cardiovascular:  RRR, Nl S1/S2, -M/R/G. Lungs:  Coarse BS diffusely and crackles diffusely. Abdomen:  Soft, distended, ascites, and +BS. Musculoskeletal:  2+ edema bilaterally. Skin:  Multiple bruises scattered throughout.  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  Type II  CORONARY ARTERY DISEASE  Atrial fibrillation  COPD  Hypokalemia  Hypoglycemia  Acute on chronic combined systolic and diastolic CHF (congestive heart failure)  UTI (lower urinary tract infection)  Stage III chronic kidney disease  Lower extremity edema  Microcytic anemia  Hyperkalemia  Pleural effusion, right  Urinary retention with incomplete bladder emptying  Acute on chronic renal failure  Hyponatremia  Acute respiratory failure  Shock circulatory  Altered mental status   ASSESSMENT AND PLAN  PULMONARY  Lab 03/21/12 0544 03/20/12 1423 03/20/12 1422  PHART 7.366 -- 7.432  PCO2ART 38.6 -- 35.1  PO2ART 130.0* -- 377.0*  HCO3 22.2 -- 23.5  O2SAT 99.0 82.4 100.0   Ventilator Settings: Vent Mode:  [-] PRVC FiO2 (%):  [40 %-100 %] 40 % Set Rate:  [14 bmp] 14 bmp Vt Set:  [600 mL] 600 mL PEEP:  [5 cmH20] 5 cmH20 Plateau Pressure:  [17 cmH20-27 cmH20] 17 cmH20 CXR:  Pending ETT:  12/29>>>  A:  Respiratory failure post PEA arrest likely related to narcotic use. P:   -Full vent support, no weaning given mental status and evolving myoclonus. -CXR and ABG in AM. -Abx per ID section, ?aspiration.  CARDIOVASCULAR  Lab 03/20/12 1230 03/20/12 1220  TROPONINI -- 0.30*  LATICACIDVEN 2.6* --  PROBNP -- --   ECG:  Pending Lines: R Diatek catheter>>>  L IJ TLC 12/29>>>  R radial a-line 12/29>>  A: Hypotension, ?cardiogenic vs septic. P:  -CVP to differentiate cause of hypotension. -Treat as septic shock. -Cardiology input appreciated. -Echo ordered per cards.  RENAL  Lab 03/21/12 0500 03/20/12 1220 03/19/12 1000 03/06/2012 0857 03/17/12 0410 03/16/12 0500  NA 135 132* 134* 131* 128* --  K 4.7 4.1 -- -- -- --  CL 98 96 98 94* 91* --  CO2 23 22 28 26 26  --  BUN 46* 34* 26* 28* 49* --  CREATININE 3.58* 3.23* 2.18* 2.41* 3.89* --  CALCIUM 8.6 7.9* 8.1* 8.3* 8.8 --  MG 2.4 -- -- -- -- --  PHOS 5.4* -- 3.6 3.8 5.8* 5.6*   Intake/Output      12/29 0701 -  12/30 0700 12/30 0701 - 12/31 0700   P.O.     I.V. (mL/kg) 1379 (17.1) 100 (1.2)   Other 68    IV Piggyback 450    Total Intake(mL/kg) 1897 (23.5) 100 (1.2)   Urine (mL/kg/hr)  20 (0)   Other     Total Output  20   Net +1897 +80         Foley:  In place, ?if needed, minimal urine.  A:  Renal failure on HD, ?hepatorenal syndrome. P:   HD per renal. Recheck labs in AM.  GASTROINTESTINAL  Lab 03/21/12 0500 03/20/12 1220 03/19/12 1000 03/02/2012 0857 03/17/12 0410 03/16/12 0500 03/15/12 0405  AST 29 27 -- -- 25 25 20   ALT 8 7 -- -- 14 14 14   ALKPHOS 164* 147* -- -- 137* 120* 95  BILITOT 0.6 0.5 -- -- 0.6 0.5 0.5  PROT 6.9 6.2 -- -- 7.1 7.0 6.5  ALBUMIN 2.0* 1.8* 2.0* 2.0* 2.1* -- --    A:  Hepatic failure due to etoh abuse. P:   Continue TF.  HEMATOLOGIC  Lab 03/21/12 0500 03/20/12 1220 03/20/12 0825 03/19/12 1000 03/07/2012 0852  HGB 9.9* 9.2* 9.9* 9.5* 9.6*  HCT 33.6* 31.8* 34.3* 31.2* 31.4*  PLT 152 135* 132* 140* 145*  INR 1.59* 1.65* -- -- 1.63*  APTT -- 52* -- -- --   A:  Liver failure. P:  SCDs until head CT is negative. SQ heparin.  INFECTIOUS  Lab 03/21/12 0500 03/20/12 1220 03/20/12 0825 03/19/12 1000 03/10/2012 0852  WBC 7.1 7.6 7.9 8.2 7.6  PROCALCITON -- 0.31 -- -- --   Cultures: Blood 12/29>>> Urine 12/29>>> Sputum 12/29>>> C. Diff 12/29>>>Positive MRSA positive. Antibiotics: Vanc 12/29>>> Zosyn 12/29>>> Flagyl 12/29>>>  A:  ?aspiration PNA. P:   Sepsis protocol but modified to avoid fluid overload. KVO IVF. Abx as above. F/U on cultures.  ENDOCRINE  Lab 03/21/12 0733 03/21/12 0424 03/21/12 0005 03/20/12 1955 03/20/12 1606  GLUCAP 111* 117* 124* 124* 117*   A:  DM   P:   ISS Cortisol level Stress dose steroids.  NEUROLOGIC  A:  Obtunded post code, narcs vs CVA vs anoxia. P:   Head CT negative. EEG. Neuro consult. Will defer anti-epileptic drugs to neuro. Intermittent sedation if needed.  CC time 35 min.  [name],  M.D. Pulmonary and Critical Care Medicine [facility] Pager: [phone]  03/21/2012, 12:20 PM

## 2012-03-21 NOTE — Progress Notes (Signed)
  Echocardiogram 2D Echocardiogram has been performed.  Jorje Guild 03/21/2012, 10:53 AM

## 2012-03-21 NOTE — Progress Notes (Signed)
Jaris Kohles (patient's wife) called and stated that she did not want HD done on Mr. Caprio.  RN advised that the procedure was about to begin. Wife adamantly refused for the HD to be done.  RN took name and phone number and told Mrs. Harnisch that Renal doctor would speak to her.  RN called Dr. Caryn Section and advised of the situation. Dr. Caryn Section called Mrs. Correa and spoke to her about the need for HD.  Mrs. Zalar finally agreed to allow HD to begin. Dr. Caryn Section called RN and HD RN was advised that it was ok to proceed.

## 2012-03-21 NOTE — Progress Notes (Signed)
C. Diff  Pos  Orders placed per protocol

## 2012-03-21 NOTE — Progress Notes (Signed)
PT Cancellation Note  Patient Details Name: [name] MRN: [id number] DOB: 01/30/1946   Cancelled Treatment:    Reason Eval/Treat Not Completed: Medical issues which prohibited therapy.  Pt coded 04/15/23 and suffered and ?anoxic BI--now GCS 4 and otherwise not doing well. 03/21/2012  [name], PT [phone] (pager)    Tamara Monteith, [name] 03/21/2012, 10:05 AM

## 2012-03-21 NOTE — Progress Notes (Signed)
Metoprolol XL changed to short acting per tube

## 2012-03-21 NOTE — Progress Notes (Signed)
NUTRITION FOLLOW UP  Intervention:    Initiate TF via OG tube with Nepro at 15 ml/h, increase by 10 ml every 4 hours to goal rate of 35 ml/h with Prostat 30 ml BID to provide 1712 kcals, 98 gm protein, 611 ml free water daily.  Discontinue Resource Breeze daily while NPO.  Nutrition Dx:   Inadequate oral intake r/t inability to eat AEB NPO status.  Goal:   Pt to meet >/= 90% of their estimated nutrition needs, unmet.  Monitor:   TF tolerance, weight trend, labs, I/O's, vent status.  Assessment:   Patient transferred to ICU and intubated 12/29 due to cardiac arrest and respiratory failure.  Patient is currently intubated on ventilator support.  Is receiving intermittent HD, may require CVVHD soon per Nephrology.   MV: 8.8 Temp:Temp (24hrs), Avg:98 F (36.7 C), Min:97.3 F (36.3 C), Max:99.9 F (37.7 C)   Height: Ht Readings from Last 1 Encounters:  03/17/12 5\' 5"  (1.651 m)    Weight Status:   Wt Readings from Last 1 Encounters:  03/21/12 177 lb 11.1 oz (80.6 kg)  Weight down slightly from 81.1 kg on 12/23 Weight fluctuating with fluids and renal failure Unknown dry weight  Re-estimated needs:  Kcal: 1815 Protein: 85 - 100 grams  Fluid: 1.2 liters daily  Skin: stage 2 sacral wound  Diet Order:   NPO  Previous diet:  Renal 80/90-2-2 with 1200 ml fluid restriction; [employer] daily.  Patient was eating well prior to intubation.   Intake/Output Summary (Last 24 hours) at 03/21/12 0925 Last data filed at 03/21/12 0800  Gross per 24 hour  Intake   1917 ml  Output     20 ml  Net   1897 ml    Last BM: 12/28   Labs:   Lab 03/21/12 0500 03/20/12 1220 03/19/12 1000 02/25/2012 0857  NA 135 132* 134* --  K 4.7 4.1 4.5 --  CL 98 96 98 --  CO2 23 22 28  --  BUN 46* 34* 26* --  CREATININE 3.58* 3.23* 2.18* --  CALCIUM 8.6 7.9* 8.1* --  MG 2.4 -- -- --  PHOS 5.4* -- 3.6 3.8  GLUCOSE 136* 147* 131* --    CBG (last 3)   Basename 03/21/12 0733 03/21/12 0424  03/21/12 0005  GLUCAP 111* 117* 124*    Scheduled Meds:    . antiseptic oral rinse  15 mL Mouth Rinse QID  . chlorhexidine  15 mL Mouth Rinse BID  . darbepoetin (ARANESP) injection - DIALYSIS  40 mcg Intravenous Q Fri-HD  . doxercalciferol  1 mcg Intravenous Q M,W,F-HD  . feeding supplement  1 Container Oral Q24H  . gabapentin  100 mg Oral TID  . hydrocortisone sodium succinate  50 mg Intravenous Q6H  . insulin aspart  0-24 Units Subcutaneous Q4H  . metoprolol succinate  25 mg Oral QHS  . metronidazole  500 mg Intravenous Q8H  . multivitamin  1 tablet Oral QHS  . pantoprazole  40 mg Oral Daily  . piperacillin-tazobactam (ZOSYN)  IV  2.25 g Intravenous Q8H  . polyethylene glycol  17 g Oral Daily  . senna-docusate  1 tablet Oral BID  . simvastatin  40 mg Oral QHS  . sodium chloride  25 mL/kg Intravenous Once  . sodium chloride  3 mL Intravenous Q12H  . Tamsulosin HCl  0.4 mg Oral QPC supper    Continuous Infusions:    . sodium chloride 250 mL (03/19/12 1457)  . sodium chloride    .  norepinephrine (LEVOPHED) Adult infusion 0.8 mcg/min (03/20/12 1400)    Joaquin Courts, RD, LDN, CNSC Pager# [phone] After Hours Pager# [phone]

## 2012-03-21 NOTE — Telephone Encounter (Signed)
Message copied by Fredrich Birks on Mon Mar 21, 2012 10:58 AM ------      Message from: Melene Plan      Created: Mon Mar 21, 2012 10:01 AM      Regarding: FW: charge and f/u                   ----- Message -----         From: Chuck Hint, MD         Sent: 03/06/2012   4:31 PM           To: Reuel Derby, Melene Plan, RN      Subject: charge and f/u                                           PROCEDURE:        1. Placement of right IJ Diatek catheter (23 cm) and removal of right IJ temporary catheter      2. Left radial cephalic AV fistula              SURGEON: Di Kindle. Edilia Bo, MD, FACS                  ASSIST: Doreatha Massed, PA                  He will need a follow up visit in 6 weeks with a duplex of his fistula. Thank you. CSD

## 2012-03-21 NOTE — Progress Notes (Signed)
Patient: [name] Date of Encounter: 03/21/2012, 12:30 PM Admit date: 02/27/2012     Subjective  [name] suffered a 12-minute PEA arrest yesterday, felt related to respiratory failure, ? narcotic use for pain. He is now intubated, hypotensive and poorly responsive, not on sedation.  [name] has had an extremely complicated hospital course with pericardial effusion requiring a window, pleural effusion requiring a chest tube, renal failure requiring dialysis and liver failure from alcohol abuse.    Objective  Physical Exam: Vitals: BP 141/67  Pulse 83  Temp 99.9 F (37.7 C) (Oral)  Resp 20  Ht 5\' 5"  (1.651 m)  Wt 177 lb 11.1 oz (80.6 kg)  BMI 29.57 kg/m2  SpO2 100% General: Well developed, ill appearing 66 year old male.  Neck: Supple. JVD not elevated. Lungs: Coarse breath sounds throughout. No wheezes or rhonchi.  Heart: Regular S1 S2 without murmur, rub or gallop.  Abdomen: Appears distended. Extremities: No clubbing or cyanosis. 2+ pedal edema bilaterally.   Neuro: Poorly responsive.  Intake/Output:  Intake/Output Summary (Last 24 hours) at 03/21/12 1230 Last data filed at 03/21/12 1200  Gross per 24 hour  Intake    998 ml  Output     20 ml  Net    978 ml    Inpatient Medications:     . antiseptic oral rinse  15 mL Mouth Rinse QID  . chlorhexidine  15 mL Mouth Rinse BID  . darbepoetin (ARANESP) injection - DIALYSIS  40 mcg Intravenous Q Fri-HD  . doxercalciferol  1 mcg Intravenous Q M,W,F-HD  . feeding supplement (NEPRO CARB STEADY)  1,000 mL Per Tube Q24H  . feeding supplement  30 mL Per Tube BID  . gabapentin  100 mg Oral TID  . hydrocortisone sodium succinate  50 mg Intravenous Q6H  . insulin aspart  0-24 Units Subcutaneous Q4H  . metoprolol succinate  25 mg Oral QHS  . metronidazole  500 mg Intravenous Q8H  . multivitamin  1 tablet Oral QHS  . pantoprazole sodium  40 mg Per Tube Daily  . piperacillin-tazobactam (ZOSYN)  IV  2.25 g  Intravenous Q8H  . polyethylene glycol  17 g Oral Daily  . senna-docusate  1 tablet Oral BID  . simvastatin  40 mg Oral QHS  . sodium chloride  25 mL/kg Intravenous Once  . sodium chloride  3 mL Intravenous Q12H  . Tamsulosin HCl  0.4 mg Oral QPC supper  . vancomycin  1,000 mg Intravenous To Hemo      . sodium chloride 20 mL/hr at 03/21/12 1110  . sodium chloride    . norepinephrine (LEVOPHED) Adult infusion 0.8 mcg/min (03/20/12 1400)    Labs:  Basename 03/21/12 0500 03/20/12 1220 03/19/12 1000  NA 135 132* --  K 4.7 4.1 --  CL 98 96 --  CO2 23 22 --  GLUCOSE 136* 147* --  BUN 46* 34* --  CREATININE 3.58* 3.23* --  CALCIUM 8.6 7.9* --  MG 2.4 -- --  PHOS 5.4* -- 3.6    Basename 03/21/12 0500 03/20/12 1220  AST 29 27  ALT 8 7  ALKPHOS 164* 147*  BILITOT 0.6 0.5  PROT 6.9 6.2  ALBUMIN 2.0* 1.8*    Basename 03/21/12 0500 03/20/12 1220  WBC 7.1 7.6  NEUTROABS -- --  HGB 9.9* 9.2*  HCT 33.6* 31.8*  MCV 79.4 82.8  PLT 152 135*    Basename 03/20/12 1220  CKTOTAL --  CKMB --  TROPONINI 0.30*    Basename 03/21/12 0500  INR 1.59*    Radiology/Studies: Dg Chest Port 1 View  03/21/2012  *RADIOLOGY REPORT*  Clinical Data: Shortness of breath.  Endotracheal tube placement.  PORTABLE CHEST - 1 VIEW  Comparison: 12/29 and 03/19/2012  Findings: The endotracheal tube is in good position approximately 4 cm above the carina.  Double-lumen central venous catheter and left- sided central venous catheter tips are at the cavoatrial junction. Pacemaker in place. NG tube tip below the diaphragm.  Persistent marked cardiomegaly.  Increasing bilateral pleural effusions.  Pulmonary vascularity has increased.  IMPRESSION: Increasing pleural effusions and increasing pulmonary vascularity.   Original Report Authenticated By: Francene Boyers, M.D.    Dg Chest Portable 1 View  03/20/2012  *RADIOLOGY REPORT*  Clinical Data: Sepsis, line placement  PORTABLE CHEST - 1 VIEW  Comparison:  Chest radiograph 2012-03-14 7:15 hours  Findings: Interval placement endotracheal tube positioned 4 cm from carina.  Interval placement of a left central venous line with tip in the distal SVC.  No pneumothorax.  Stable cardiac silhouette which is enlarged.  There is bibasilar air space disease suggesting pulmonary edema.  IMPRESSION:  1.  Interval intubation and placement of left central line without complication. 2.  Cardiomegaly and basilar air space disease suggesting edema.   Original Report Authenticated By: Genevive Bi, M.D.     Echocardiogram: EF 40-45% previously; repeat echo since event yesterday pending this AM 12-lead ECG: V paced rhythm at 80 bpm Telemetry: V paced    Assessment and Plan  1. PEA arrest 2. Respiratory failure 3. Renal failure 4. Liver failure 5. C.diff. and MRSA positive 6. Anemia No new cardiac recommendations at this time. Agree with neurology consultation. Dr. [name] to see.  Signed, Rick Duff PA-C  EP Attending  Agree with above. No new rec's. Note poor prognosis.  Leonia Reeves.D.

## 2012-03-21 NOTE — Progress Notes (Signed)
[facility]  Subjective:  Intubated, poorly responsive, on NO SEDATIVES PEA cardiac arrest yesterday Scheduled for HD later today   Objective: Vital signs in last 24 hours: Blood pressure 141/67, pulse 86, temperature 99.9 F (37.7 C), temperature source Oral, resp. rate 14, height 5\' 5"  (1.651 m), weight 80.6 kg (177 lb 11.1 oz), SpO2 100.00%.    PHYSICAL EXAM General--as above Chest--R IJ tunneled HD cath, L IJ 3 lumen cath, pacer L subclavian, rhonchi Heart--no rub Abd--nontender Extr--2+ pretib edema  Date  Weight 17 Dec 72 kg 20 Dec 73.9 23 Dec 77.9 26 Dec 74.5 30 Dec 80.6  Lab Results:   Lab 03/21/12 0500 03/20/12 1220 03/19/12 1000 03/01/2012 0857  NA 135 132* 134* --  K 4.7 4.1 4.5 --  CL 98 96 98 --  CO2 23 22 28  --  BUN 46* 34* 26* --  CREATININE 3.58* 3.23* 2.18* --  ALB -- -- -- --  GLUCOSE 136* -- -- --  CALCIUM 8.6 7.9* 8.1* --  PHOS 5.4* -- 3.6 3.8     Basename 03/21/12 0500 03/20/12 1220  WBC 7.1 7.6  HGB 9.9* 9.2*  HCT 33.6* 31.8*  PLT 152 135*   I have reviewed the patient's current medications.   Assessment/Plan: 1.  AKI superimposed on CKD-- (Cr 1.68 in Aug--2.8 on 12 Dec--Cr 3.58 today).   CXR shows worsening pl eff and increasing pulm vasc congestion.  3L goal for HD today.  I suspect his "dry weight" is less than 70 kg (weight 80.6 kg today).  May need CVVH if weight continues to increase and pulm condition does not improve.  AVF placed in L forearm 27 Dec 2.  Ischemic cardiomyopathy/CHF--not responding to diuretics.  Dialysis today--may need CVVH 3.  Pericardial effusion--S/P window 11 Mar 2012--no new suggestions 4.  Afib, s/p nodal ablation + pacer--no new suggestions 5.  S/P PEA arrest 29 Dec--poorly responsive.  May need neuro eval 6.  + MRSA--on vanco (plus zosyn) 7.  + C Diff--on flagyl 8.  Anemia--  On Aranesp 40/wk.   Hgb 9.9.  Fe/TIBC 9 % sat with ferritin 131 on 17 Dec.--received Nulecit 125 x 5 doses  earlier this month.   Repeat Fe studies today 9.  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  Phosphorus 5.4, Ca 8.6 earlier today   LOS: 25 days   Leniyah Martell F 03/21/2012,9:42 AM   .labalb

## 2012-03-22 ENCOUNTER — Inpatient Hospital Stay (HOSPITAL_COMMUNITY): Payer: Medicare Other

## 2012-03-22 LAB — POCT I-STAT 3, ART BLOOD GAS (G3+)
Acid-Base Excess: 1 mmol/L (ref 0.0–2.0)
Bicarbonate: 25.9 mEq/L — ABNORMAL HIGH (ref 20.0–24.0)
Patient temperature: 98.7
TCO2: 27 mmol/L (ref 0–100)
pH, Arterial: 7.415 (ref 7.350–7.450)

## 2012-03-22 LAB — GLUCOSE, CAPILLARY
Glucose-Capillary: 205 mg/dL — ABNORMAL HIGH (ref 70–99)
Glucose-Capillary: 200 mg/dL — ABNORMAL HIGH (ref 70–99)
Glucose-Capillary: 181 mg/dL — ABNORMAL HIGH (ref 70–99)
Glucose-Capillary: 187 mg/dL — ABNORMAL HIGH (ref 70–99)

## 2012-03-22 LAB — CBC
HCT: 31.6 % — ABNORMAL LOW (ref 39.0–52.0)
Hemoglobin: 9.9 g/dL — ABNORMAL LOW (ref 13.0–17.0)
MCH: 24.4 pg — ABNORMAL LOW (ref 26.0–34.0)
MCHC: 31.3 g/dL (ref 30.0–36.0)
RDW: 21.3 % — ABNORMAL HIGH (ref 11.5–15.5)

## 2012-03-22 LAB — BASIC METABOLIC PANEL
BUN: 36 mg/dL — ABNORMAL HIGH (ref 6–23)
Chloride: 99 mEq/L (ref 96–112)
Creatinine, Ser: 2.46 mg/dL — ABNORMAL HIGH (ref 0.50–1.35)
Glucose, Bld: 194 mg/dL — ABNORMAL HIGH (ref 70–99)
Potassium: 3.5 mEq/L (ref 3.5–5.1)

## 2012-03-22 LAB — CULTURE, RESPIRATORY W GRAM STAIN

## 2012-03-22 MED ORDER — METRONIDAZOLE 50 MG/ML ORAL SUSPENSION
500.0000 mg | Freq: Three times a day (TID) | ORAL | Status: DC
  Administered 2012-03-22 – 2012-03-29 (×22): 500 mg
  Filled 2012-03-22 (×26): qty 10

## 2012-03-22 MED ORDER — FERUMOXYTOL INJECTION 510 MG/17 ML
510.0000 mg | Freq: Once | INTRAVENOUS | Status: AC
  Administered 2012-03-22: 510 mg via INTRAVENOUS
  Filled 2012-03-22: qty 17

## 2012-03-22 MED ORDER — DARBEPOETIN ALFA-POLYSORBATE 100 MCG/0.5ML IJ SOLN
100.0000 ug | INTRAMUSCULAR | Status: DC
  Administered 2012-03-23: 100 ug via SUBCUTANEOUS
  Filled 2012-03-22 (×2): qty 0.5

## 2012-03-22 MED ORDER — SODIUM CHLORIDE 0.9 % IV SOLN: 500.0000 mg | INTRAVENOUS | Status: DC

## 2012-03-22 MED ORDER — CLONAZEPAM 1 MG PO TABS
1.0000 mg | ORAL_TABLET | Freq: Three times a day (TID) | ORAL | Status: DC | PRN
  Administered 2012-03-22 – 2012-03-23 (×2): 1 mg via ORAL
  Filled 2012-03-22 (×2): qty 1

## 2012-03-22 MED ORDER — HYDROCORTISONE SOD SUCCINATE 100 MG IJ SOLR
50.0000 mg | Freq: Two times a day (BID) | INTRAMUSCULAR | Status: DC
  Administered 2012-03-22 – 2012-03-23 (×2): 50 mg via INTRAVENOUS
  Filled 2012-03-22 (×4): qty 1

## 2012-03-22 MED ORDER — FAMOTIDINE 40 MG/5ML PO SUSR
40.0000 mg | Freq: Two times a day (BID) | ORAL | Status: DC
Start: 2012-03-22 — End: 2012-03-22
  Filled 2012-03-22 (×2): qty 5

## 2012-03-22 MED ORDER — VALPROATE SODIUM 500 MG/5ML IV SOLN
500.0000 mg | Freq: Three times a day (TID) | INTRAVENOUS | Status: DC
  Administered 2012-03-22 – 2012-03-23 (×4): 500 mg via INTRAVENOUS
  Filled 2012-03-22 (×5): qty 5

## 2012-03-22 MED ORDER — FAMOTIDINE 40 MG/5ML PO SUSR
20.0000 mg | Freq: Every day | ORAL | Status: DC
  Administered 2012-03-22 – 2012-03-29 (×8): 20 mg via ORAL
  Filled 2012-03-22 (×8): qty 2.5

## 2012-03-22 MED ORDER — INSULIN ASPART 100 UNIT/ML ~~LOC~~ SOLN
0.0000 [IU] | SUBCUTANEOUS | Status: DC
  Administered 2012-03-22 (×4): 2 [IU] via SUBCUTANEOUS
  Administered 2012-03-23: 3 [IU] via SUBCUTANEOUS
  Administered 2012-03-23: 2 [IU] via SUBCUTANEOUS
  Administered 2012-03-23: 3 [IU] via SUBCUTANEOUS
  Administered 2012-03-23: 2 [IU] via SUBCUTANEOUS
  Administered 2012-03-23 (×2): 3 [IU] via SUBCUTANEOUS
  Administered 2012-03-24 (×2): 2 [IU] via SUBCUTANEOUS
  Administered 2012-03-24: 1 [IU] via SUBCUTANEOUS
  Administered 2012-03-24: 2 [IU] via SUBCUTANEOUS
  Administered 2012-03-24 – 2012-03-28 (×5): 1 [IU] via SUBCUTANEOUS

## 2012-03-22 MED ORDER — VALPROATE SODIUM 500 MG/5ML IV SOLN
1500.0000 mg | Freq: Once | INTRAVENOUS | Status: AC
  Administered 2012-03-22: 1500 mg via INTRAVENOUS
  Filled 2012-03-22: qty 15

## 2012-03-22 MED ORDER — SODIUM CHLORIDE 0.9 % IV SOLN
500.0000 mg | Freq: Once | INTRAVENOUS | Status: AC
  Administered 2012-03-23: 500 mg via INTRAVENOUS
  Filled 2012-03-22: qty 5

## 2012-03-22 NOTE — Progress Notes (Signed)
PMT SW PMT follow up, no c/b from pt's wife Chou Busler. Pt comfortable, no family present upon visit.  Spoke with pt's wife Bonita Quin by phone [phone] who request  GOC tomorrow 03/23/12 at 4pm so that pt's pastor and a family friend can attend.   Extensive support to pt's wife by phone. Wife very overwhelmed with emotion, often pressured in her speech during our conversation. Wife reports tension between herself and pt's 1st wife and children. Wife reports her perception that his 1st wife and children "just want to take him off the machines and he needs more time for God to let him live". Wife asked that NO ONE beside herself and Renato Gails be present or notified of GOC tomorrow. Did encourage wife to consider including other family for decision-making and for purposes of emotional support.  Wife reports she and pt have been married 32 years, they have no children together. Wife reports strong christian faith and hope for "healing"; she and pt are [employer]. Encouraged good self-care as wife reports insomnia and anxiety since pt's admission. Will follow up Thursday for emotional support. Wife given PMT contact info should needs arise.   [name], LCSWA PMT Phone [phone]

## 2012-03-22 NOTE — Progress Notes (Signed)
[name]  Subjective:  Intubated, still with myoclonic jerks, undergoing EEG.  Just seen by Neuro. Dialyzed yesterday    Objective: Vital signs in last 24 hours: Blood pressure 136/64, pulse 89, temperature 98.9 F (37.2 C), temperature source Oral, resp. rate 24, height 5\' 5"  (1.651 m), weight 78.3 kg (172 lb 9.9 oz), SpO2 100.00%.    PHYSICAL EXAM General--as above  Chest--R IJ tunneled HD cath, L IJ 3 lumen cath, pacer L subclavian, rhonchi  Heart--no rub  Abd--nontender  Extr--1+ pretib edema  Date   Weight  17 Dec 72 kg  20 Dec  73.9  23 Dec  77.9  26 Dec  74.5  30 Dec 80.6 31 Dec 78.3   Lab Results:   Lab 03/22/12 0515 03/21/12 0500 03/20/12 1220 03/19/12 1000  NA 136 135 132* --  K 3.5 4.7 4.1 --  CL 99 98 96 --  CO2 24 23 22  --  BUN 36* 46* 34* --  CREATININE 2.46* 3.58* 3.23* --  ALB -- -- -- --  GLUCOSE 194* -- -- --  CALCIUM 8.1* 8.6 7.9* --  PHOS 3.9 5.4* -- 3.6     Basename 03/22/12 0515 03/21/12 0500  WBC 9.9 7.1  HGB 9.9* 9.9*  HCT 31.6* 33.6*  PLT 175 152    Assessment/Plan: 1. AKI superimposed on CKD-- (Cr 1.68 in Aug--2.8 on 12 Dec--Cr 2.46 today after HD yesterday).  AVF placed in L forearm 27 Dec.  No indications for HD today.  Will check CXR  2. Ischemic cardiomyopathy/CHF--not responding to diuretics. Dialysis yesterday.  Will get HD prn.   3. Pericardial effusion--S/P window 03-19-12--no new suggestions  4. Afib, s/p nodal ablation + pacer--no new suggestions  5. S/P PEA arrest 29 Dec--poorly responsive.  Neuro eval suggests poor px 6. + MRSA--on vanco (plus zosyn)  7. + C Diff--on flagyl  8. Anemia-- On Aranesp 40/wk. Hgb 9.9. Fe/TIBC 9 % sat with ferritin 131 on 17 Dec.--received Nulecit 125 x 5 doses earlier this month. Fe/TIBC 8% sat with ferritin 276--will rx IV Fe  And increase aranesp to 100/wk 9. 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 Phosphorus 3.9, Ca 8.1 earlier today     LOS: 26 days   [name] F 03/22/2012,9:40 AM   .labalb

## 2012-03-22 NOTE — Progress Notes (Signed)
Physical Therapy Discharge Patient Details Name: [name] MRN: [id number] DOB: [birth year] Today's Date: 03/22/2012 Time:  -     Patient discharged from PT services secondary to medical decline - will need to re-order PT to resume therapy services.  Please see latest therapy progress note for current level of functioning and progress toward goals.    Progress and discharge plan discussed with patient and/or caregiver: Patient unable to participate in discharge planning and no caregivers available  03/22/2012  Benton Bing, PT [phone] (pager)   GP     [name], [name] 03/22/2012, 9:44 AM

## 2012-03-22 NOTE — Procedures (Signed)
History: 66 yo M s/p arrest with generalized myoclonus  Sedation: None  Background: This study is comprised of bilateralt centrally predominant spike and wave discharges. There is some irregular delat and theta activity as well.   EEG Diagnosis: 1) Myoclonic status epilepticus  Clinical Interpretation: This EEG is consistent with myoclonic status epilepticus, which coupled with his history of post-anoxic injury represents a grim prognosis.   Ritta Slot, MD [facility] [phone]  If 7pm- 7am, please page neurology on call at [phone].

## 2012-03-22 NOTE — Progress Notes (Signed)
Name: [name] MRN: [id number] DOB: Jun 27, 1945    LOS: 26  Referring Provider:  [facility] Reason for Referral:  Cardiac arrest and respiratory failure.  PULMONARY / CRITICAL CARE MEDICINE  HPI:  66 year old with history of alcohol abuse who presented to the hospital on December 6th with SOB, was found to have acute on chronic respiratory failure with EF of 40-45 percent who had an extremely complicated hospital course with pericardial effusion requiring a window, pleural effusion requiring a chest tube, renal failure requiring dialysis and liver failure from etoh.  On 12/29 patient presented to the ICU after a 12 minute PEA cardiac arrest that was likely related to respiratory failure ?narcotic use for pain.  Upon presenting to the ICU the patient was hypotensive and obtunded post code.  ETT:  12/29>>> R Diatek catheter>>> L IJ TLC 12/29>>> R radial a-line 12/29>>12/31  Events Since transfer to ICU: 12/29 Cardiac arrest. 12/30 Myoclonus >> neuro consulted, C diff positive  Current Status:  Myoclonus with any stimulation.  Vital Signs: Temp:  [97.7 F (36.5 C)-99.9 F (37.7 C)] 98.7 F (37.1 C) (12/31 0348) Pulse Rate:  [80-93] 80  (12/31 0700) Resp:  [3-36] 13  (12/31 0700) BP: (88-142)/(39-67) 129/58 mmHg (12/31 0510) SpO2:  [97 %-100 %] 100 % (12/31 0700) Arterial Line BP: (95-149)/(46-70) 129/62 mmHg (12/31 0700) FiO2 (%):  [40 %] 40 % (12/31 0700) Weight:  [169 lb 8.5 oz (76.9 kg)-179 lb 10.8 oz (81.5 kg)] 172 lb 9.9 oz (78.3 kg) (12/31 0500)  Physical Examination: General:  Ill appearins Neuro:  Unresponsive, b/l jerking movements with any stimulation HEENT:  ETT in place Cardiovascular:  s1s2 with 2/6 SM Lungs: scattered rhonchi Abdomen:  Soft, non tender Musculoskeletal:  2+ edema bilaterally. Skin:  Multiple bruises scattered throughout.  Ct Head Wo Contrast  03/22/2012  *RADIOLOGY REPORT*  Clinical Data: Follow up head CT; continuous seizures.  CT HEAD  WITHOUT CONTRAST  Technique:  Contiguous axial images were obtained from the base of the skull through the vertex without contrast.  Comparison: CT of the head performed 03/20/2012  Findings: There is no evidence of acute infarction, mass lesion, or intra- or extra-axial hemorrhage on CT.  Evaluation is suboptimal due to motion artifact.  The posterior fossa, including the cerebellum, brainstem and fourth ventricle, is within normal limits.  The third and lateral ventricles, and basal ganglia are unremarkable in appearance.  The cerebral hemispheres are symmetric in appearance, with normal gray- white differentiation.  No mass effect or midline shift is seen.  There is no evidence of fracture; visualized osseous structures are unremarkable in appearance.  The visualized portions of the orbits are within normal limits.  The paranasal sinuses and mastoid air cells are well-aerated.  No significant soft tissue abnormalities are seen.  IMPRESSION: Evaluation suboptimal due to motion artifact; no acute intracranial pathology seen.   Original Report Authenticated By: Tonia Ghent, M.D.    Dg Chest Port 1 View  03/21/2012  *RADIOLOGY REPORT*  Clinical Data: Shortness of breath.  Endotracheal tube placement.  PORTABLE CHEST - 1 VIEW  Comparison: 12/29 and 03/19/2012  Findings: The endotracheal tube is in good position approximately 4 cm above the carina.  Double-lumen central venous catheter and left- sided central venous catheter tips are at the cavoatrial junction. Pacemaker in place. NG tube tip below the diaphragm.  Persistent marked cardiomegaly.  Increasing bilateral pleural effusions.  Pulmonary vascularity has increased.  IMPRESSION: Increasing pleural effusions and increasing pulmonary vascularity.  Original Report Authenticated By: Francene Boyers, M.D.    Dg Chest Portable 1 View  03/20/2012  *RADIOLOGY REPORT*  Clinical Data: Sepsis, line placement  PORTABLE CHEST - 1 VIEW  Comparison: Chest radiograph  03/20/2012 7:15 hours  Findings: Interval placement endotracheal tube positioned 4 cm from carina.  Interval placement of a left central venous line with tip in the distal SVC.  No pneumothorax.  Stable cardiac silhouette which is enlarged.  There is bibasilar air space disease suggesting pulmonary edema.  IMPRESSION:  1.  Interval intubation and placement of left central line without complication. 2.  Cardiomegaly and basilar air space disease suggesting edema.   Original Report Authenticated By: Genevive Bi, M.D.    Ct Portable Head W/o Cm  03/20/2012  *RADIOLOGY REPORT*  Clinical Data: Altered mental status.  CT HEAD WITHOUT CONTRAST  Technique:  Contiguous axial images were obtained from the base of the skull through the vertex without contrast.  Comparison: 05/04/2009.  Findings: Mild age appropriate atrophy.  Patchy chronic small vessel disease throughout the deep white matter.  Small old lacunar infarct in the right basal ganglia. No acute intracranial abnormality.  Specifically, no hemorrhage, hydrocephalus, mass lesion, acute infarction, or significant intracranial injury.  No acute calvarial abnormality.  Visualized paranasal sinuses and mastoids clear.  Orbital soft tissues unremarkable.  IMPRESSION: No acute intracranial abnormality.   Original Report Authenticated By: Charlett Nose, M.D.     Lab Results  Component Value Date   CREATININE 2.46* 03/22/2012   BUN 36* 03/22/2012   NA 136 03/22/2012   K 3.5 03/22/2012   CL 99 03/22/2012   CO2 24 03/22/2012   Lab Results  Component Value Date   ALT 8 03/21/2012   AST 29 03/21/2012   ALKPHOS 164* 03/21/2012   BILITOT 0.6 03/21/2012    Lab Results  Component Value Date   WBC 9.9 03/22/2012   HGB 9.9* 03/22/2012   HCT 31.6* 03/22/2012   MCV 77.8* 03/22/2012   PLT 175 03/22/2012   CBG (last 3)   Basename 03/22/12 0341 03/22/12 0041 03/21/12 2021  GLUCAP 205* 185* 156*      ASSESSMENT AND PLAN  PULMONARY  A:  Acute  respiratory failure after PEA arrest. Hx of COPD. P:   Full vent support F/u CXR  CARDIOVASCULAR   A: Shock 2nd to cardiac arrest, sepsis. Hemodynamics improved. Acute on chronic systolic CHF Hx of A fib/flutter, CAD, HTN, Hyperlipidemia. P:  Keep even to negative fluid balance Wean off solucorterf Continue lopressor, simvastatin  RENAL  A:  Renal failure on HD, ?hepatorenal syndrome. P:   HD per renal  GASTROINTESTINAL  A:  Hepatic failure in setting of ETOH and cardiac arrest. Nutrition. Diarrhea 2nd to C diff. P:   Continue TF D/c miralax, senna   HEMATOLOGIC  Lab 03/22/12 0515 03/21/12 0500 03/20/12 1220 03/20/12 0825 03/19/12 1000 03/17/2012 0852  HGB 9.9* 9.9* 9.2* 9.9* 9.5* --  HCT 31.6* 33.6* 31.8* 34.3* 31.2* --  PLT 175 152 135* 132* 140* --  INR -- 1.59* 1.65* -- -- 1.63*  APTT -- -- 52* -- -- --   A:  Anemia/thrombocytopenia in setting of chronic disease, critical illness, and Hx of ETOH. P:  F/u CBC SQ heparin.  INFECTIOUS  Lab 03/22/12 0515 03/21/12 0500 03/20/12 1220 03/20/12 0825 03/19/12 1000  WBC 9.9 7.1 7.6 7.9 8.2  PROCALCITON -- -- 0.31 -- --   Cultures: Blood 12/29>>> Urine 12/29>>>yeast Sputum 12/29>>> C. Diff 12/29>>>Positive MRSA positive.  Antibiotics: Vanc 12/29>>> Zosyn 12/29>>>12/31 Flagyl 12/29>>>  A:  C diff colitis.   Doubt pneumonia. 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 P:   Keppra per neuro F/u EEG Consult palliative care to assist with goals of care  CC time 35 min.  Coralyn Helling, MD [facility] Pulmonary/Critical Care 03/22/2012, 8:03 AM Pager:  [phone] After 3pm call: [phone]

## 2012-03-22 NOTE — Progress Notes (Signed)
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  He has received one dose of Keppra 1500 mg on 03/21/12.  Patient did receive dialysis but did not receive the supplementary dose of Keppra after dialysis.  He currently is receiving 1000 mg Keppra now.   OBJECTIVE:                                                                                                                           Vital signs in last 24 hours: Temp:  [97.7 F (36.5 C)-98.8 F (37.1 C)] 98.7 F (37.1 C) (12/31 0348) Pulse Rate:  [80-93] 80  (12/31 0700) Resp:  [3-36] 13  (12/31 0700) BP: (88-142)/(39-67) 129/58 mmHg (12/31 0510) SpO2:  [97 %-100 %] 100 % (12/31 0700) Arterial Line BP: (95-147)/(46-70) 129/62 mmHg (12/31 0700) FiO2 (%):  [40 %] 40 % (12/31 0700) Weight:  [76.9 kg (169 lb 8.5 oz)-81.5 kg (179 lb 10.8 oz)] 78.3 kg (172 lb 9.9 oz) (12/31 0500)  Intake/Output from previous day: 12/30 0701 - 12/31 0700 In: 1425 [I.V.:450; NG/GT:525; IV Piggyback:450] Out: 2915 [Urine:30] Intake/Output this shift:   Nutritional status:    Past Medical History  Diagnosis Date  . ALCOHOL ABUSE, HX OF 04/30/2008  . 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 Atrial flutter 09/20/2006  . 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 Chronic systolic heart failure 09/20/2006    previous EF 25%;  Echocardiogram 12/10/10: Mild-moderate LVH, EF 50-55%, mild MR, moderate LAE, moderate RAE, mild RVE with moderately reduced RV SF, PASP 50, small pericardial effusion  . COPD 12/17/2006  . CORONARY ARTERY DISEASE 09/20/2006    s/p stent to OM;  catheterization 12/06: Mid to distal LAD 30%, D2 20%, proximal circumflex 30%, OM 2 stents patent, ostial RCA 50%, mid RCA 40%, distal RCA multiple 40%    . 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 DYSPEPSIA&OTHER [facility] DISORDERS FUNCTION STOMACH 04/04/2009  . HYPERTENSION 12/17/2006  .  OBESITY 04/30/2008  . HLD (hyperlipidemia) 09/20/2006  . OTITIS EXTERNA, CHRONIC NEC 12/28/2006  . PERSONAL HX COLONIC POLYPS 05/02/2009  . PSORIASIS 04/30/2008  . CKD (chronic kidney disease) 04/30/2008    creatinine 2.2 range     Neurologic Exam:  Mental Status:  Patient does not respond to verbal stimuli. Does not respond to deep sternal rub. Does not follow commands. No verbalizations noted.  Cranial Nerves:  II: patient does not respond confrontation bilaterally, pupils right 2 mm, left 2 mm,and reactive bilaterally. Discs difficult to visualize.  III,IV,VI: doll's response present bilaterally.  V,VII: corneal reflex present bilaterally  VIII: patient does not respond to verbal stimuli  IX,X: gag reflex reduced, XI: trapezius strength unable to test bilaterally. Breathing over the vent.  XII: tongue strength unable to test  Motor:  Extremities with increased tone throughout and continued active bilateral generalized myoclonus. No spontaneous movement noted. No purposeful movements noted.  Sensory:  Does not respond to noxious stimuli in any extremity.  Deep Tendon Reflexes:  1+ bilateral UE and knee jerk. No AJ bilaterally.  Plantars:  equivocal bilaterally  Cerebellar:  Unable to perform   Lab Results: Lab Results  Component Value Date/Time   CHOL 120 02/08/2011  5:45 AM   Lipid Panel No results found for this basename: CHOL,TRIG,HDL,CHOLHDL,VLDL,LDLCALC in the last 72 hours  Studies/Results: Ct Head Wo Contrast  03/22/2012  *RADIOLOGY REPORT*  Clinical Data: Follow up head CT; continuous seizures.  CT HEAD WITHOUT CONTRAST  Technique:  Contiguous axial images were obtained from the base of the skull through the vertex without contrast.  Comparison: CT of the head performed 03/20/2012  Findings:  There is no evidence of acute infarction, mass lesion, or intra- or extra-axial hemorrhage on CT.  Evaluation is suboptimal due to motion artifact.  The posterior fossa, including the cerebellum, brainstem and fourth ventricle, is within normal limits.  The third and lateral ventricles, and basal ganglia are unremarkable in appearance.  The cerebral hemispheres are symmetric in appearance, with normal gray- white differentiation.  No mass effect or midline shift is seen.  There is no evidence of fracture; visualized osseous structures are unremarkable in appearance.  The visualized portions of the orbits are within normal limits.  The paranasal sinuses and mastoid air cells are well-aerated.  No significant soft tissue abnormalities are seen.  IMPRESSION: Evaluation suboptimal due to motion artifact; no acute intracranial pathology seen.   Original Report Authenticated By: Tonia Ghent, M.D.    Dg Chest Port 1 View  03/22/2012  *RADIOLOGY REPORT*  Clinical Data: ET tube placement?Respiratory distress.  PORTABLE CHEST - 1 VIEW  Comparison: 03/21/2012  Findings: ET tube is approximately 3.4 cm above the carina.  The cardiac silhouette is enlarged.  Double-lumen central venous catheter and left-sided central venous catheter tips project near the cavoatrial junction.  Pacemaker in place.  NG tube tip below the diaphragm.  Bilateral pleural effusions appears stable.  Mild vascular congestion may be slightly improved.  No pneumothorax.  IMPRESSION: ET tube approximately 3.4 cm above the carina.   Original Report Authenticated By: Davonna Belling, M.D.    Dg Chest Port 1 View  03/21/2012  *RADIOLOGY REPORT*  Clinical Data: Shortness of breath.  Endotracheal tube placement.  PORTABLE CHEST - 1 VIEW  Comparison: 12/29 and 03/19/2012  Findings: The endotracheal tube is in good position approximately 4 cm above the carina.  Double-lumen central venous catheter and left- sided central venous catheter tips are at the  cavoatrial junction.  Pacemaker in place. NG tube tip below the diaphragm.  Persistent marked cardiomegaly.  Increasing bilateral pleural effusions.  Pulmonary vascularity has increased.  IMPRESSION: Increasing pleural effusions and increasing pulmonary vascularity.   Original Report Authenticated By: Francene Boyers, M.D.    Dg Chest Portable 1 View  03/20/2012  *RADIOLOGY REPORT*  Clinical Data: Sepsis, line placement  PORTABLE CHEST - 1 VIEW  Comparison: Chest radiograph 03/10/2012 7:15 hours  Findings: Interval placement endotracheal tube positioned 4 cm from carina.  Interval placement of a left central venous line with tip in the distal SVC.  No pneumothorax.  Stable cardiac silhouette which is enlarged.  There is bibasilar air space disease suggesting pulmonary edema.  IMPRESSION:  1.  Interval intubation and placement of left central line without complication. 2.  Cardiomegaly and basilar air space disease suggesting edema.   Original Report Authenticated By: Genevive Bi, M.D.    Ct Portable Head W/o Cm  03/20/2012  *RADIOLOGY REPORT*  Clinical Data: Altered mental status.  CT HEAD WITHOUT CONTRAST  Technique:  Contiguous axial images were obtained from the base of the skull through the vertex without contrast.  Comparison: 05/04/2009.  Findings: Mild age appropriate atrophy.  Patchy chronic small vessel disease throughout the deep white matter.  Small old lacunar infarct in the right basal ganglia. No acute intracranial abnormality.  Specifically, no hemorrhage, hydrocephalus, mass lesion, acute infarction, or significant intracranial injury.  No acute calvarial abnormality.  Visualized paranasal sinuses and mastoids clear.  Orbital soft tissues unremarkable.  IMPRESSION: No acute intracranial abnormality.   Original Report Authenticated By: Charlett Nose, M.D.     MEDICATIONS                                                                                                                         Scheduled:   . antiseptic oral rinse  15 mL Mouth Rinse QID  . chlorhexidine  15 mL Mouth Rinse BID  . darbepoetin (ARANESP) injection - DIALYSIS  40 mcg Intravenous Q Fri-HD  . doxercalciferol  1 mcg Intravenous Q M,W,F-HD  . famotidine  20 mg Oral Daily  . feeding supplement (NEPRO CARB STEADY)  1,000 mL Per Tube Q24H  . feeding supplement  30 mL Per Tube BID  . heparin subcutaneous  5,000 Units Subcutaneous Q8H  . hydrocortisone sodium succinate  50 mg Intravenous Q12H  . insulin aspart  0-9 Units Subcutaneous Q4H  . levetiracetam  1,000 mg Intravenous Q24H  . metoprolol tartrate  25 mg Per Tube BID  . metroNIDAZOLE  500 mg Per Tube Q8H  . multivitamin  1 tablet Oral QHS  . simvastatin  40 mg Oral QHS  . sodium chloride  3 mL Intravenous Q12H  . Tamsulosin HCl  0.4 mg Oral QPC supper  . 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 Post-anoxic generalized myoclonus is almost universally associated with a poor prognosis. Patient initially  responded very well to a keppra load yesterday but is again showing generalized myoclonus. Last Keppra administration 1400 03/21/12.  EEG to look for background reactivity still pending. CT head showed no acute pathology.   Will continue Keppra for symptomatic treatment and follow EEG.   Felicie Morn PA-C [facility] [phone]  03/22/2012, 8:26 AM   I have seen the patient and confirmed the above note.   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 Especially in light of his other comorbidities, I feel that this represents a grim prognosis in this gentleman.   We can continue treating the myoclonus with keppra and depakote, however treating the myoclonus aggressively has not  been shown to improve outcome in this setting.   At this time, I feel that a move toward comfort care would be appropriate and agree with a palliative care consultation.   Ritta Slot, MD [facility] [phone]  If 7pm- 7am, please page neurology on call at [phone].

## 2012-03-22 NOTE — Progress Notes (Signed)
Palliative Medicine Team consult for goals of care requested by Dr Craige Cotta; no family at bedside; attempted to contact wife at numbers listed on shadow chart - [phone] - this number seems to be the correct number -left message and am awaiting call back - Staff RN has PMT number and will also notify when family arrives on unit- will continue to try to reach family to schedule meeting   Valente David, RN 03/22/2012, 11:26 AM Palliative Medicine Team RN Liaison [phone]

## 2012-03-22 NOTE — Progress Notes (Signed)
[facility] ADULT ICU REPLACEMENT PROTOCOL FOR AM LAB REPLACEMENT ONLY  The patient does not apply for the [facility] Adult ICU Electrolyte Replacment Protocol based on the criteria listed below:      Is BUN < 30 mg/dL? no  Patient's BUN today is 36  Abnormal electrolyte(s): K3.5   Melrose Nakayama 03/22/2012 6:38 AM

## 2012-03-22 NOTE — Progress Notes (Signed)
Physical Therapy Treatment Note (performed on 03/17/12)   03/17/12 1300  PT Visit Information  Last PT Received On 03/17/12  Assistance Needed +2  PT Time Calculation  PT Start Time 1120  PT Stop Time 1145  PT Time Calculation (min) 25 min  Subjective Data  Subjective "I'm just a little more fatigue today.  I'm suppose to go to HD this afternoon."  Patient Stated Goal Get better.  Precautions  Precautions Fall  Cognition  Overall Cognitive Status Appears within functional limits for tasks assessed/performed  Arousal/Alertness Awake/alert  Orientation Level Appears intact for tasks assessed  Behavior During Session [facility] for tasks performed  Bed Mobility  Bed Mobility Not assessed  Transfers  Transfers Sit to Stand;Stand to Sit  Sit to Stand 1: +2 Total assist;From chair/3-in-1  Sit to Stand: Patient Percentage 70%  Stand to Sit 4: Min assist;To chair/3-in-1  Details for Transfer Assistance Increase assistance needed to stand due to overall fatigue.  Max cues for hand placement  Ambulation/Gait  Ambulation/Gait Assistance 4: Min assist  Ambulation Distance (Feet) 60 Feet  Assistive device Other (Comment) (w/c with supplemental oxygen)  Ambulation/Gait Assistance Details Assist for balance with cues for safety and tall posture.  Gait Pattern Step-through pattern;Decreased stride length;Trunk flexed;Narrow base of support  Stairs No  Wheelchair Mobility  Wheelchair Mobility No  Balance  Balance Assessed No  PT - End of Session  Equipment Utilized During Treatment Gait belt;Oxygen (2L)  Activity Tolerance Patient limited by fatigue  Patient left in chair;with call bell/phone within reach  Nurse Communication Mobility status  PT - Assessment/Plan  Comments on Treatment Session Pt with increase fatigue this session but willing to participate with therapy.  Pt needed increase assistance with sit to stand and unable to ambulate as far as last session.  Noticeable decrease in Sa02  with ambulation to 87% on 2L.  Pt able to improve Sa02 to 93% on 2L with deep breathing    PT Plan Discharge plan remains appropriate;Frequency remains appropriate  PT Frequency Min 3X/week  Follow Up Recommendations Home health PT;Supervision/Assistance - 24 hour  PT equipment None recommended by PT  Acute Rehab PT Goals  PT Goal Formulation With patient  Time For Goal Achievement 03/21/12  Potential to Achieve Goals Good  Pt will go Sit to Stand with modified independence  PT Goal: Sit to Stand - Progress Not met  PT Goal: Stand to Sit - Progress Progressing toward goal  Pt will Ambulate >150 feet;with modified independence;with least restrictive assistive device  PT Goal: Ambulate - Progress Not met  PT General Charges  $$ ACUTE PT VISIT 1 Procedure  PT Treatments  $Gait Training 8-22 mins  $Therapeutic Activity 8-22 mins     [place], [place] DPT [phone]

## 2012-03-22 NOTE — Progress Notes (Signed)
EEG completed.

## 2012-03-23 ENCOUNTER — Inpatient Hospital Stay (HOSPITAL_COMMUNITY): Payer: Medicare Other

## 2012-03-23 DIAGNOSIS — G253 Myoclonus: Secondary | ICD-10-CM

## 2012-03-23 LAB — GLUCOSE, CAPILLARY
Glucose-Capillary: 217 mg/dL — ABNORMAL HIGH (ref 70–99)
Glucose-Capillary: 189 mg/dL — ABNORMAL HIGH (ref 70–99)
Glucose-Capillary: 201 mg/dL — ABNORMAL HIGH (ref 70–99)

## 2012-03-23 LAB — CBC
HCT: 31.5 % — ABNORMAL LOW (ref 39.0–52.0)
Hemoglobin: 9.7 g/dL — ABNORMAL LOW (ref 13.0–17.0)
MCHC: 30.8 g/dL (ref 30.0–36.0)
Platelets: 173 10*3/uL (ref 150–400)
RBC: 4 MIL/uL — ABNORMAL LOW (ref 4.22–5.81)
RDW: 21.7 % — ABNORMAL HIGH (ref 11.5–15.5)
WBC: 9.3 10*3/uL (ref 4.0–10.5)
WBC: 10.6 10*3/uL — ABNORMAL HIGH (ref 4.0–10.5)

## 2012-03-23 LAB — BASIC METABOLIC PANEL
BUN: 57 mg/dL — ABNORMAL HIGH (ref 6–23)
Chloride: 100 mEq/L (ref 96–112)
GFR calc Af Amer: 21 mL/min — ABNORMAL LOW (ref >90)
Potassium: 3 mEq/L — ABNORMAL LOW (ref 3.5–5.1)

## 2012-03-23 LAB — COMPREHENSIVE METABOLIC PANEL
ALT: 7 U/L (ref 0–53)
AST: 20 U/L (ref 0–37)
Albumin: 1.7 g/dL — ABNORMAL LOW (ref 3.5–5.2)
Alkaline Phosphatase: 107 U/L (ref 39–117)
Chloride: 100 mEq/L (ref 96–112)
Potassium: 3 mEq/L — ABNORMAL LOW (ref 3.5–5.1)
Sodium: 137 mEq/L (ref 135–145)
Total Bilirubin: 0.4 mg/dL (ref 0.3–1.2)

## 2012-03-23 MED ORDER — POTASSIUM CHLORIDE 20 MEQ/15ML (10%) PO LIQD
40.0000 meq | Freq: Once | ORAL | Status: AC
  Administered 2012-03-23: 40 meq
  Filled 2012-03-23: qty 30

## 2012-03-23 MED ORDER — VALPROATE SODIUM 500 MG/5ML IV SOLN
1000.0000 mg | Freq: Three times a day (TID) | INTRAVENOUS | Status: DC
  Administered 2012-03-23 – 2012-03-25 (×5): 1000 mg via INTRAVENOUS
  Filled 2012-03-23 (×7): qty 10

## 2012-03-23 MED ORDER — POTASSIUM CHLORIDE 20 MEQ/15ML (10%) PO LIQD
40.0000 meq | Freq: Once | ORAL | Status: DC
  Filled 2012-03-23 (×2): qty 30

## 2012-03-23 MED ORDER — POTASSIUM CHLORIDE CRYS ER 20 MEQ PO TBCR: 40.0000 meq | EXTENDED_RELEASE_TABLET | Freq: Once | ORAL | Status: DC

## 2012-03-23 MED ORDER — VALPROATE SODIUM 500 MG/5ML IV SOLN
500.0000 mg | Freq: Once | INTRAVENOUS | Status: AC
  Administered 2012-03-23: 500 mg via INTRAVENOUS
  Filled 2012-03-23: qty 5

## 2012-03-23 MED FILL — Medication: Qty: 1 | Status: AC

## 2012-03-23 NOTE — Progress Notes (Signed)
Portable EEG completed

## 2012-03-23 NOTE — Progress Notes (Signed)
Subjective: Patient remains intubated.  On no sedation.  Per nursing has been following some simple commands.  No further myoclonus noted.  Repeat head CT from 12/31 shows no acute abnormalities.     Objective: Current vital signs: BP 125/40  Pulse 86  Temp 96.7 F (35.9 C) (Oral)  Resp 11  Ht 5\' 5"  (1.651 m)  Wt 81 kg (178 lb 9.2 oz)  BMI 29.72 kg/m2  SpO2 100% Vital signs in last 24 hours: Temp:  [96.7 F (35.9 C)-98.9 F (37.2 C)] 96.7 F (35.9 C) (01/01 0430) Pulse Rate:  [79-124] 86  (01/01 0800) Resp:  [0-31] 11  (01/01 0800) BP: (99-146)/(24-95) 125/40 mmHg (01/01 0800) SpO2:  [99 %-100 %] 100 % (01/01 0800) Arterial Line BP: (116-148)/(52-67) 142/60 mmHg (12/31 1700) FiO2 (%):  [40 %] 40 % (01/01 0805) Weight:  [81 kg (178 lb 9.2 oz)] 81 kg (178 lb 9.2 oz) (01/01 0437)  Intake/Output from previous day: 12/31 0701 - 01/01 0700 In: 2236.7 [I.V.:1066.7; NG/GT:860; IV Piggyback:310] Out: 825 [Urine:25; Stool:800] Intake/Output this shift:   Nutritional status:    Neurologic Exam: Mental Status: When asked by nursing to open his eyes did so on two occasions.  Stuck out tongue to command.  With deep sternal rub moves upper extremities minimally and some movement noted of the LLE.  No verbalizations noted.  Cranial Nerves: II: patient does not respond confrontation bilaterally, pupils right 3 mm, left 3 mm,and reactive bilaterally III,IV,VI: doll's response present bilaterally.  V,VII: corneal reflex absent bilaterally  VIII: grossly intact IX,X: gag reflex reduced, XI: trapezius strength unable to test bilaterally XII: patient extends tongue Motor: Extremities flaccid throughout.  Does show some movement in the upper extremities and in the LLE but not purposeful.  Does not localize to pain. Sensory: Does not respond to noxious stimuli in any extremity. Deep Tendon Reflexes:  1+ throughout with absent AJ's bilaterally Plantars: mute  bilaterally Cerebellar: Unable to perform    Lab Results: Basic Metabolic Panel:  Lab 03/23/12 1610 03/22/12 0515 03/21/12 0500 03/20/12 1220 03/19/12 1000 April 12, 2012 0857  NA 140 136 135 132* 134* --  K 3.0* 3.5 4.7 4.1 4.5 --  CL 100 99 98 96 98 --  CO2 24 24 23 22 28  --  GLUCOSE 226* 194* 136* 147* 131* --  BUN 57* 36* 46* 34* 26* --  CREATININE 3.26* 2.46* 3.58* 3.23* 2.18* --  CALCIUM 8.1* 8.1* 8.6 -- -- --  MG -- 2.1 2.4 -- -- --  PHOS 4.4 3.9 5.4* -- 3.6 3.8    Liver Function Tests:  Lab 03/21/12 0500 03/20/12 1220 03/19/12 1000 04/12/12 0857 03/17/12 0410  AST 29 27 -- -- 25  ALT 8 7 -- -- 14  ALKPHOS 164* 147* -- -- 137*  BILITOT 0.6 0.5 -- -- 0.6  PROT 6.9 6.2 -- -- 7.1  ALBUMIN 2.0* 1.8* 2.0* 2.0* 2.1*   No results found for this basename: LIPASE:5,AMYLASE:5 in the last 168 hours No results found for this basename: AMMONIA:3 in the last 168 hours  CBC:  Lab 03/23/12 0445 03/22/12 0515 03/21/12 0500 03/20/12 1220 03/20/12 0825  WBC 9.3 9.9 7.1 7.6 7.9  NEUTROABS -- -- -- -- --  HGB 9.7* 9.9* 9.9* 9.2* 9.9*  HCT 31.5* 31.6* 33.6* 31.8* 34.3*  MCV 78.8 77.8* 79.4 82.8 83.3  PLT 167 175 152 135* 132*    Cardiac Enzymes:  Lab 03/20/12 1220  CKTOTAL --  CKMB --  CKMBINDEX --  TROPONINI  0.30*    Lipid Panel: No results found for this basename: CHOL:5,TRIG:5,HDL:5,CHOLHDL:5,VLDL:5,LDLCALC:5 in the last 168 hours  CBG:  Lab 03/23/12 0738 03/23/12 0425 03/23/12 0036 03/22/12 2021 03/22/12 1619  GLUCAP 217* 214* 223* 187* 181*    Microbiology: Results for orders placed during the hospital encounter of February 29, 2012  URINE CULTURE     Status: Normal   Collection Time   02/26/12  1:28 AM      Component Value Range Status Comment   Specimen Description URINE, RANDOM   Final    Special Requests Normal   Final    Culture  Setup Time 02/26/2012 04:22   Final    Colony Count >=100,000 COLONIES/ML   Final    Culture     Final    Value: ESCHERICHIA COLI      Note: Two isolates with different morphologies were identified as the same organism.The most resistant organism was reported.   Report Status 02/29/2012 FINAL   Final    Organism ID, Bacteria ESCHERICHIA COLI   Final   BODY FLUID CULTURE     Status: Normal   Collection Time   03/01/12  2:09 PM      Component Value Range Status Comment   Specimen Description PLEURAL RIGHT EFFUSION   Final    Special Requests Normal   Final    Gram Stain     Final    Value: RARE WBC PRESENT, PREDOMINANTLY PMN     NO ORGANISMS SEEN   Culture NO GROWTH 3 DAYS   Final    Report Status 03/05/2012 FINAL   Final   URINE CULTURE     Status: Normal   Collection Time   03/02/12  3:14 PM      Component Value Range Status Comment   Specimen Description URINE, CLEAN CATCH   Final    Special Requests NONE   Final    Culture  Setup Time 03/03/2012 01:27   Final    Colony Count NO GROWTH   Final    Culture NO GROWTH   Final    Report Status 03/04/2012 FINAL   Final   URINE CULTURE     Status: Normal   Collection Time   03/09/12  6:14 AM      Component Value Range Status Comment   Specimen Description URINE, CLEAN CATCH   Final    Special Requests NONE   Final    Culture  Setup Time 03/09/2012 15:19   Final    Colony Count NO GROWTH   Final    Culture NO GROWTH   Final    Report Status 03/10/2012 FINAL   Final   URINE CULTURE     Status: Normal   Collection Time   02/21/2012  1:56 AM      Component Value Range Status Comment   Specimen Description URINE, CATHETERIZED   Final    Special Requests CX ADDED AT 0242 ON [id number]   Final    Culture  Setup Time 02/29/2012 02:49   Final    Colony Count NO GROWTH   Final    Culture NO GROWTH   Final    Report Status 03/12/2012 FINAL   Final   SURGICAL PCR SCREEN     Status: Abnormal   Collection Time   03/12/2012  5:16 AM      Component Value Range Status Comment   MRSA, PCR POSITIVE (*) NEGATIVE Final    Staphylococcus aureus POSITIVE (*) NEGATIVE Final   TISSUE  CULTURE     Status: Normal   Collection Time   03/13/2012  4:43 PM      Component Value Range Status Comment   Specimen Description TISSUE PERICARDIUM   Final    Special Requests NONE   Final    Gram Stain     Final    Value: NO WBC SEEN     NO ORGANISMS SEEN   Culture NO GROWTH 3 DAYS   Final    Report Status 03/15/2012 FINAL   Final   BODY FLUID CULTURE     Status: Normal   Collection Time   03/20/2012  4:48 PM      Component Value Range Status Comment   Specimen Description FLUID PERICARDIAL   Final    Special Requests NONE   Final    Gram Stain     Final    Value: NO WBC SEEN     NO ORGANISMS SEEN   Culture NO GROWTH 3 DAYS   Final    Report Status 03/15/2012 FINAL   Final   BODY FLUID CULTURE     Status: Normal   Collection Time   03/10/2012  4:50 PM      Component Value Range Status Comment   Specimen Description FLUID RIGHT PLEURAL   Final    Special Requests NONE   Final    Gram Stain     Final    Value: RARE WBC PRESENT, PREDOMINANTLY MONONUCLEAR     NO ORGANISMS SEEN   Culture NO GROWTH 3 DAYS   Final    Report Status 03/15/2012 FINAL   Final   CULTURE, BLOOD (ROUTINE X 2)     Status: Normal (Preliminary result)   Collection Time   03/20/12 12:00 PM      Component Value Range Status Comment   Specimen Description BLOOD CENTRAL LINE   Final    Special Requests BOTTLES DRAWN AEROBIC ONLY 4CC   Final    Culture  Setup Time 03/20/2012 18:44   Final    Culture     Final    Value:        BLOOD CULTURE RECEIVED NO GROWTH TO DATE CULTURE WILL BE HELD FOR 5 DAYS BEFORE ISSUING A FINAL NEGATIVE REPORT   Report Status PENDING   Incomplete   CULTURE, RESPIRATORY     Status: Normal   Collection Time   03/20/12 12:50 PM      Component Value Range Status Comment   Specimen Description TRACHEAL ASPIRATE   Final    Special Requests Normal   Final    Gram Stain     Final    Value: RARE WBC PRESENT, PREDOMINANTLY PMN     NO SQUAMOUS EPITHELIAL CELLS SEEN     FEW YEAST WITH  PSEUDOHYPHAE     RARE GRAM POSITIVE RODS   Culture FEW CANDIDA ALBICANS   Final    Report Status 03/22/2012 FINAL   Final   CULTURE, BLOOD (ROUTINE X 2)     Status: Normal (Preliminary result)   Collection Time   03/20/12  1:30 PM      Component Value Range Status Comment   Specimen Description BLOOD A-LINE   Final    Special Requests BOTTLES DRAWN AEROBIC ONLY 8CC   Final    Culture  Setup Time 03/20/2012 18:44   Final    Culture     Final    Value:        BLOOD CULTURE RECEIVED NO GROWTH  TO DATE CULTURE WILL BE HELD FOR 5 DAYS BEFORE ISSUING A FINAL NEGATIVE REPORT   Report Status PENDING   Incomplete   URINE CULTURE     Status: Normal   Collection Time   April 17, 2012  6:01 PM      Component Value Range Status Comment   Specimen Description URINE, CATHETERIZED   Final    Special Requests NONE   Final    Culture  Setup Time 03/21/2012 01:34   Final    Colony Count 80,000 COLONIES/ML   Final    Culture YEAST   Final    Report Status 03/21/2012 FINAL   Final   CLOSTRIDIUM DIFFICILE BY PCR     Status: Abnormal   Collection Time   03/21/12  4:00 AM      Component Value Range Status Comment   C difficile by pcr POSITIVE (*) NEGATIVE Final     Coagulation Studies:  Basename 03/21/12 0500 April 17, 2012 1220  LABPROT 18.5* 19.0*  INR 1.59* 1.65*    Imaging: Ct Head Wo Contrast  03/22/2012  *RADIOLOGY REPORT*  Clinical Data: Follow up head CT; continuous seizures.  CT HEAD WITHOUT CONTRAST  Technique:  Contiguous axial images were obtained from the base of the skull through the vertex without contrast.  Comparison: CT of the head performed 04-17-2012  Findings: There is no evidence of acute infarction, mass lesion, or intra- or extra-axial hemorrhage on CT.  Evaluation is suboptimal due to motion artifact.  The posterior fossa, including the cerebellum, brainstem and fourth ventricle, is within normal limits.  The third and lateral ventricles, and basal ganglia are unremarkable in appearance.   The cerebral hemispheres are symmetric in appearance, with normal gray- white differentiation.  No mass effect or midline shift is seen.  There is no evidence of fracture; visualized osseous structures are unremarkable in appearance.  The visualized portions of the orbits are within normal limits.  The paranasal sinuses and mastoid air cells are well-aerated.  No significant soft tissue abnormalities are seen.  IMPRESSION: Evaluation suboptimal due to motion artifact; no acute intracranial pathology seen.   Original Report Authenticated By: Tonia Ghent, M.D.    Dg Chest Port 1 View  03/22/2012  *RADIOLOGY REPORT*  Clinical Data: ET tube placement?Respiratory distress.  PORTABLE CHEST - 1 VIEW  Comparison: 03/21/2012  Findings: ET tube is approximately 3.4 cm above the carina.  The cardiac silhouette is enlarged.  Double-lumen central venous catheter and left-sided central venous catheter tips project near the cavoatrial junction.  Pacemaker in place.  NG tube tip below the diaphragm.  Bilateral pleural effusions appears stable.  Mild vascular congestion may be slightly improved.  No pneumothorax.  IMPRESSION: ET tube approximately 3.4 cm above the carina.   Original Report Authenticated By: Davonna Belling, M.D.     Medications:  I have reviewed the patient's current medications. Scheduled:   . antiseptic oral rinse  15 mL Mouth Rinse QID  . chlorhexidine  15 mL Mouth Rinse BID  . darbepoetin (ARANESP) injection - NON-DIALYSIS  100 mcg Subcutaneous Q Wed-1800  . darbepoetin (ARANESP) injection - DIALYSIS  40 mcg Intravenous Q Fri-HD  . doxercalciferol  1 mcg Intravenous Q M,W,F-HD  . famotidine  20 mg Oral Daily  . feeding supplement (NEPRO CARB STEADY)  1,000 mL Per Tube Q24H  . feeding supplement  30 mL Per Tube BID  . heparin subcutaneous  5,000 Units Subcutaneous Q8H  . hydrocortisone sodium succinate  50 mg Intravenous Q12H  .  insulin aspart  0-9 Units Subcutaneous Q4H  . levetiracetam   1,000 mg Intravenous Q24H  . levetiracetam  500 mg Intravenous Q M,W,F-HD  . levetiracetam  500 mg Intravenous Once  . metoprolol tartrate  25 mg Per Tube BID  . metroNIDAZOLE  500 mg Per Tube Q8H  . multivitamin  1 tablet Oral QHS  . potassium chloride  40 mEq Per Tube Once  . simvastatin  40 mg Oral QHS  . sodium chloride  3 mL Intravenous Q12H  . Tamsulosin HCl  0.4 mg Oral QPC supper  . valproate sodium  500 mg Intravenous Q8H  . vancomycin  125 mg Oral QID    Assessment/Plan:  Patient Active Hospital Problem List: Myoclonic status epilepticus   Assessment: Patient without evidence of myoclonus on examination.  On Keppra and Depakote.     Plan:  1.  Depakote level today  2.  Continue current AED's at current doses Altered mental status (03/20/2012)   Assessment: Mental status improved.  Patient now able to follow some simple commands.     Plan:  1.  Repeat EEG today     LOS: 27 days   Thana Farr, MD [facility] [phone] 03/23/2012  8:12 AM

## 2012-03-23 NOTE — Consult Note (Addendum)
Palliative Medicine Team  Consult Note Requested by: CCM  Reason for consult: Goals of Care, Symptom Management, Care Coordination   Problem List: 1. S/P PEA Arrest with significant neurological deficits 2. Anoxic Brain Injury 3. VDRF 4. COPD 5. ESRD, on HD 6. CHF, chronic 7. Pleural effusions, pericardial effusions 8. DM2 9. CAD   Met with patient's wife, pastor and a close friend regarding goals of care. This is very complex situation in determining goals of care for multiple reasons - (1) Inter-family conflict, children from prior marriage want full comfort care and are in direct confrontation/ power struggle with patient's current wife (2) Very Low health literacy (3) unrealistic expectations for recovery and the capabilities of medicine (4) possible underlying mental illness/anxiety disorder in family memebers (5) extreme belief that god will perform a miracle.  Ms. Felten has very limited understanding of the complexity and serious nature of her husbands condition and I am not sure she will ever be able to grasp or think abstractly about the future or the trajectory of his disease in a way that will allow her to make decisions based on what her husband would have wanted.  Today's conversation was mostly about clarifying and education issues surrounding his condition and determining the best way to help in decision making and in providing information so that it is useful to them. The pastor present, while not committal in making decisions, was helpful. Wife requesting that no information be given to other family members. There is no formal HCPOA.  Summary of today's goals:  1. Full Code- extensive discussion and strong recommendation made for DNR status-but explicit request made to not change this designation. She wants to "pray" about it and is feeling pressure. If she is unable to make decision we may need to entertain the concept of 2 physician DNR. It would be medically futile to  perform another code in the even of cardiac arrest.   2. Maintain full scope medical treatment- I have exporled in detail the direction this is likely to take given anoxic injury PEA arrest and current condition with end-stage co-morbidities/multiorgan failure-including death, feeding tube, trach, LTACH (wife tells me someone has told her [facility]) and life limitations such as bed-bound status and other physical and cognitive deficits that may be the result.   3. We will meet on Friday for next round of goals of care discussion to assess his current situation and what the next steps should be for him and continue to be a guide and support to this family as his condition evolves.   Will continue to follow. No changes for now.   Anderson Malta, DO Palliative Medicine

## 2012-03-23 NOTE — Progress Notes (Signed)
Inpatient Diabetes Program Recommendations  AACE/ADA: New Consensus Statement on Inpatient Glycemic Control (2013)  Target Ranges:  Prepandial:   less than 140 mg/dL      Peak postprandial:   less than 180 mg/dL (1-2 hours)      Critically ill patients:  140 - 180 mg/dL   Reason for Visit: Glucose sustained in 200 range  Inpatient Diabetes Program Recommendations Insulin - Basal: xxxxxxx Correction (SSI): xxxxxxxxxxxx Insulin - Meal Coverage: Please consider additiion of meal coverage, starting low with 3 units q 4 hrs.  Note: Thank you, Lenor Coffin, RN, CNS, Diabetes Coordinator [phone])

## 2012-03-23 NOTE — Progress Notes (Signed)
eLink Physician-Brief Progress Note Patient Name: KELSO BIBBY DOB: April 17, 1945 MRN: [id number]  Date of Service  03/23/2012   HPI/Events of Note  Hypokalemia in the setting of renal insufficiency   eICU Interventions  Potassium replaced 40 mEq via tube   Intervention Category Minor Interventions: Electrolytes abnormality - evaluation and management  Jaanvi Fizer 03/23/2012, 6:00 AM

## 2012-03-23 NOTE — Progress Notes (Signed)
Hypotension   We will monitor; routine labs ordered

## 2012-03-23 NOTE — Progress Notes (Signed)
Hypokalemia   K replaced  

## 2012-03-23 NOTE — Progress Notes (Signed)
Name: [name] MRN: [id number] DOB: Feb 10, 1946    LOS: 27  Referring Provider:  [facility] Reason for Referral:  Cardiac arrest and respiratory failure.  PULMONARY / CRITICAL CARE MEDICINE  HPI:  67 year old with history of alcohol abuse who presented to the hospital on December 6th with SOB, was found to have acute on chronic respiratory failure with EF of 40-45 percent who had an extremely complicated hospital course with pericardial effusion requiring a window, pleural effusion requiring a chest tube, renal failure requiring dialysis and liver failure from etoh.  On 12/29 patient presented to the ICU after a 12 minute PEA cardiac arrest that was likely related to respiratory failure ?narcotic use for pain.  Upon presenting to the ICU the patient was hypotensive and obtunded post code.  ETT:  12/29>>> R Diatek catheter>>> L IJ TLC 12/29>>> R radial a-line 12/29>>12/31  Events Since transfer to ICU: 12/29 Cardiac arrest. 12/30 Myoclonus >> neuro consulted, C diff positive 1/01 Follows simple commands.  Current Status:  Tolerated some pressure support.  Follows simple commands.  Vital Signs: Temp:  [96.6 F (35.9 C)-97.8 F (36.6 C)] 96.6 F (35.9 C) (01/01 1211) Pulse Rate:  [79-91] 84  (01/01 1106) Resp:  [0-27] 15  (01/01 1106) BP: (99-146)/(24-95) 109/42 mmHg (01/01 1106) SpO2:  [99 %-100 %] 99 % (01/01 1106) Arterial Line BP: (137-148)/(60-67) 142/60 mmHg (12/31 1700) FiO2 (%):  [40 %] 40 % (01/01 1106) Weight:  [178 lb 9.2 oz (81 kg)] 178 lb 9.2 oz (81 kg) (01/01 0437)  Physical Examination: General:  Ill appearins Neuro:  Opens eyes, sticks out tongue on command HEENT:  ETT in place Cardiovascular:  s1s2 with 2/6 SM Lungs: scattered rhonchi Abdomen:  Soft, non tender Musculoskeletal:  2+ edema bilaterally. Skin:  Multiple bruises scattered throughout.  Ct Head Wo Contrast  03/22/2012  *RADIOLOGY REPORT*  Clinical Data: Follow up head CT; continuous seizures.   CT HEAD WITHOUT CONTRAST  Technique:  Contiguous axial images were obtained from the base of the skull through the vertex without contrast.  Comparison: CT of the head performed 03/20/2012  Findings: There is no evidence of acute infarction, mass lesion, or intra- or extra-axial hemorrhage on CT.  Evaluation is suboptimal due to motion artifact.  The posterior fossa, including the cerebellum, brainstem and fourth ventricle, is within normal limits.  The third and lateral ventricles, and basal ganglia are unremarkable in appearance.  The cerebral hemispheres are symmetric in appearance, with normal gray- white differentiation.  No mass effect or midline shift is seen.  There is no evidence of fracture; visualized osseous structures are unremarkable in appearance.  The visualized portions of the orbits are within normal limits.  The paranasal sinuses and mastoid air cells are well-aerated.  No significant soft tissue abnormalities are seen.  IMPRESSION: Evaluation suboptimal due to motion artifact; no acute intracranial pathology seen.   Original Report Authenticated By: Tonia Ghent, M.D.    Dg Chest Port 1 View  03/23/2012  *RADIOLOGY REPORT*  Clinical Data: Follow-up pleural effusions.  PORTABLE CHEST - 1 VIEW  Comparison: 03/22/2012  Findings: Endotracheal tube is in place with tip 4.7 cm above carina.  Nasogastric tube is in place with tip off the film but beyond the gastroesophageal junction. Left IJ central line tip overlies the level of the superior vena cava.  Right-sided dual lumen central line tips overlie the superior vena cava - right atrial junction and upper right atrium.  Transvenous pacemaker has leads overlying  the right ventricle and coronary sinus.  Heart is enlarged.  There are patchy bilateral lower lobe infiltrates and bilateral pleural effusions, not significantly changed.  IMPRESSION: Little interval change.   Original Report Authenticated By: Norva Pavlov, M.D.    Dg Chest Port 1  View  03/22/2012  *RADIOLOGY REPORT*  Clinical Data: ET tube placement?Respiratory distress.  PORTABLE CHEST - 1 VIEW  Comparison: 03/21/2012  Findings: ET tube is approximately 3.4 cm above the carina.  The cardiac silhouette is enlarged.  Double-lumen central venous catheter and left-sided central venous catheter tips project near the cavoatrial junction.  Pacemaker in place.  NG tube tip below the diaphragm.  Bilateral pleural effusions appears stable.  Mild vascular congestion may be slightly improved.  No pneumothorax.  IMPRESSION: ET tube approximately 3.4 cm above the carina.   Original Report Authenticated By: Davonna Belling, M.D.     Lab Results  Component Value Date   CREATININE 3.26* 03/23/2012   BUN 57* 03/23/2012   NA 140 03/23/2012   K 3.0* 03/23/2012   CL 100 03/23/2012   CO2 24 03/23/2012   Lab Results  Component Value Date   ALT 8 03/21/2012   AST 29 03/21/2012   ALKPHOS 164* 03/21/2012   BILITOT 0.6 03/21/2012    Lab Results  Component Value Date   WBC 9.3 03/23/2012   HGB 9.7* 03/23/2012   HCT 31.5* 03/23/2012   MCV 78.8 03/23/2012   PLT 167 03/23/2012   CBG (last 3)   Basename 03/23/12 1134 03/23/12 0738 03/23/12 0425  GLUCAP 189* 217* 214*      ASSESSMENT AND PLAN  PULMONARY  A:  Acute respiratory failure after PEA arrest. Hx of COPD. P:   Pressure support wean as tolerated F/u CXR  CARDIOVASCULAR   A: Shock 2nd to cardiac arrest, sepsis. Hemodynamics improved. Acute on chronic systolic CHF Hx of A fib/flutter, CAD, HTN, Hyperlipidemia. P:  Keep even to negative fluid balance D/c solucorterf Continue lopressor, simvastatin  RENAL  A:  Renal failure on HD, ?hepatorenal syndrome. P:   HD per renal  GASTROINTESTINAL  A:  Hepatic failure in setting of ETOH and cardiac arrest. Nutrition. Diarrhea 2nd to C diff. P:   Continue TF   HEMATOLOGIC  Lab 03/23/12 0445 03/22/12 0515 03/21/12 0500 03/20/12 1220 03/20/12 0825 03/20/2012 0852  HGB 9.7* 9.9* 9.9*  9.2* 9.9* --  HCT 31.5* 31.6* 33.6* 31.8* 34.3* --  PLT 167 175 152 135* 132* --  INR -- -- 1.59* 1.65* -- 1.63*  APTT -- -- -- 52* -- --   A:  Anemia/thrombocytopenia in setting of chronic disease, critical illness, and Hx of ETOH. P:  F/u CBC SQ heparin.  INFECTIOUS  Lab 03/23/12 0445 03/22/12 0515 03/21/12 0500 03/20/12 1220 03/20/12 0825  WBC 9.3 9.9 7.1 7.6 7.9  PROCALCITON -- -- -- 0.31 --   Cultures: Blood 12/29>>> Urine 12/29>>>yeast Sputum 12/29>>> C. Diff 12/29>>>Positive MRSA positive.  Antibiotics: Vanc 12/29>>> Zosyn 12/29>>>12/31 Flagyl 12/29>>>  A:  C diff colitis.   P:   Continue flagyl, enteral vancomycin   ENDOCRINE  Lab 03/23/12 1134 03/23/12 0738 03/23/12 0425 03/23/12 0036 03/22/12 2021  GLUCAP 189* 217* 214* 223* 187*   A:  DM   P:   SSI  NEUROLOGIC  A: Myoclonus after cardiac arrest -- follows commands 1/01. P:   Keppra, depakote per neuro F/u EEG   CC time 35 min.  Coralyn Helling, MD [facility] Pulmonary/Critical Care 03/23/2012, 12:37 PM Pager:  [phone]  After 3pm call: [phone]

## 2012-03-23 NOTE — Progress Notes (Signed)
[facility]  Subjective:  Intubated, less myoclonus--on Keppra and depakote. Currently getting EEG with photic stimulation Last dialysis 30 Dec   Objective: Vital signs in last 24 hours: Blood pressure 125/40, pulse 86, temperature 96.7 F (35.9 C), temperature source Oral, resp. rate 11, height 5\' 5"  (1.651 m), weight 81 kg (178 lb 9.2 oz), SpO2 100.00%.    PHYSICAL EXAM General--as above  Chest--R IJ tunneled HD cath, L IJ 3 lumen cath, pacer L subclavian, rhonchi  Heart--no rub  Abd--nontender  Extr--1+ pretib edema, plus edema of arms  Date   Weight  17 Dec  72 kg  20 Dec  73.9  23 Dec  77.9  26 Dec  74.5  30 Dec  80.6  31 Dec  78.3 23 Mar 2012 81 kg   Lab Results:   Lab 03/23/12 0445 03/22/12 0515 03/21/12 0500  NA 140 136 135  K 3.0* 3.5 4.7  CL 100 99 98  CO2 24 24 23   BUN 57* 36* 46*  CREATININE 3.26* 2.46* 3.58*  ALB -- -- --  GLUCOSE 226* -- --  CALCIUM 8.1* 8.1* 8.6  PHOS 4.4 3.9 5.4*     Basename 03/23/12 0445 03/22/12 0515  WBC 9.3 9.9  HGB 9.7* 9.9*  HCT 31.5* 31.6*  PLT 167 175     I have reviewed the patient's current medications. Scheduled:   . antiseptic oral rinse  15 mL Mouth Rinse QID  . chlorhexidine  15 mL Mouth Rinse BID  . darbepoetin (ARANESP) injection - NON-DIALYSIS  100 mcg Subcutaneous Q Wed-1800  . darbepoetin (ARANESP) injection - DIALYSIS  40 mcg Intravenous Q Fri-HD  . doxercalciferol  1 mcg Intravenous Q M,W,F-HD  . famotidine  20 mg Oral Daily  . feeding supplement (NEPRO CARB STEADY)  1,000 mL Per Tube Q24H  . feeding supplement  30 mL Per Tube BID  . heparin subcutaneous  5,000 Units Subcutaneous Q8H  . hydrocortisone sodium succinate  50 mg Intravenous Q12H  . insulin aspart  0-9 Units Subcutaneous Q4H  . levetiracetam  1,000 mg Intravenous Q24H  . levetiracetam  500 mg Intravenous Q M,W,F-HD  . levetiracetam  500 mg Intravenous Once  . metoprolol tartrate  25 mg Per Tube BID  .  metroNIDAZOLE  500 mg Per Tube Q8H  . multivitamin  1 tablet Oral QHS  . potassium chloride  40 mEq Per Tube Once  . simvastatin  40 mg Oral QHS  . sodium chloride  3 mL Intravenous Q12H  . Tamsulosin HCl  0.4 mg Oral QPC supper  . valproate sodium  500 mg Intravenous Q8H  . vancomycin  125 mg Oral QID     Assessment/Plan: 1. AKI superimposed on CKD-- (Cr 1.68 in Aug--2.8 on 12 Dec--Cr 3.26 today. HD 30 Dec). AVF placed in L forearm 27 Dec. No indications for HD today. Will check CXR  2. Ischemic cardiomyopathy/CHF--not responding to diuretics. Dialysis 30 Dec. CXR shows B pl eff and patchy B lower lobe infiltrates.  Will get HD prn--probably tomorrow  (2 Jan) 3. Pericardial effusion--S/P window 2012/04/05--no new suggestions  4. Afib, s/p nodal ablation + pacer--no new suggestions  5. S/P PEA arrest 29 Dec--poorly responsive. Neuro eval suggests poor px  6. + MRSA--on vanco (plus zosyn)  7. + C Diff--on flagyl  8. Anemia-- On Aranesp 100/wk. Hgb 9.9. Fe/TIBC 9 % sat with ferritin 131 on 17 Dec.--on IV Fe and Aranesp 100/wk  9. 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 Phosphorus 4.4, Ca 8.1 earlier today     LOS: 27 days   [name] F 03/23/2012,10:08 AM   .labalb

## 2012-03-23 NOTE — Progress Notes (Signed)
[facility] ADULT ICU REPLACEMENT PROTOCOL FOR AM LAB REPLACEMENT ONLY  The patient does not apply for the [facility] Adult ICU Electrolyte Replacment Protocol based on the criteria listed below:     Is BUN < 30 mg/dL? no  Patient's BUN today is 57  Abnormal electrolyte(s): K3.0    Melrose Nakayama 03/23/2012 6:00 AM

## 2012-03-23 DEATH — deceased

## 2012-03-24 ENCOUNTER — Other Ambulatory Visit (HOSPITAL_COMMUNITY): Payer: Medicare Other

## 2012-03-24 ENCOUNTER — Inpatient Hospital Stay (HOSPITAL_COMMUNITY): Payer: Medicare Other

## 2012-03-24 LAB — GLUCOSE, CAPILLARY
Glucose-Capillary: 162 mg/dL — ABNORMAL HIGH (ref 70–99)
Glucose-Capillary: 143 mg/dL — ABNORMAL HIGH (ref 70–99)

## 2012-03-24 LAB — CBC
HCT: 33 % — ABNORMAL LOW (ref 39.0–52.0)
Hemoglobin: 10.2 g/dL — ABNORMAL LOW (ref 13.0–17.0)
MCH: 24.3 pg — ABNORMAL LOW (ref 26.0–34.0)
MCHC: 30.9 g/dL (ref 30.0–36.0)

## 2012-03-24 LAB — RENAL FUNCTION PANEL
Albumin: 1.8 g/dL — ABNORMAL LOW (ref 3.5–5.2)
Chloride: 101 mEq/L (ref 96–112)
Creatinine, Ser: 3.01 mg/dL — ABNORMAL HIGH (ref 0.50–1.35)
GFR calc non Af Amer: 20 mL/min — ABNORMAL LOW (ref >90)
Potassium: 3.4 mEq/L — ABNORMAL LOW (ref 3.5–5.1)
Sodium: 137 mEq/L (ref 135–145)

## 2012-03-24 LAB — COMPREHENSIVE METABOLIC PANEL
Alkaline Phosphatase: 105 U/L (ref 39–117)
BUN: 71 mg/dL — ABNORMAL HIGH (ref 6–23)
GFR calc Af Amer: 18 mL/min — ABNORMAL LOW (ref >90)
Glucose, Bld: 164 mg/dL — ABNORMAL HIGH (ref 70–99)
Potassium: 2.9 mEq/L — ABNORMAL LOW (ref 3.5–5.1)
Total Protein: 5.9 g/dL — ABNORMAL LOW (ref 6.0–8.3)

## 2012-03-24 LAB — PHOSPHORUS: Phosphorus: 4.9 mg/dL — ABNORMAL HIGH (ref 2.3–4.6)

## 2012-03-24 MED ORDER — POTASSIUM CHLORIDE 20 MEQ/15ML (10%) PO LIQD
20.0000 meq | Freq: Once | ORAL | Status: AC
  Administered 2012-03-24: 20 meq
  Filled 2012-03-24: qty 15

## 2012-03-24 MED ORDER — PRISMASOL BGK 4/2.5 32-4-2.5 MEQ/L IV SOLN
INTRAVENOUS | Status: DC
  Administered 2012-03-24 – 2012-03-28 (×10): via INTRAVENOUS_CENTRAL
  Filled 2012-03-24 (×16): qty 5000

## 2012-03-24 MED ORDER — PRISMASOL BGK 4/2.5 32-4-2.5 MEQ/L IV SOLN
INTRAVENOUS | Status: DC
  Administered 2012-03-24 – 2012-03-28 (×10): via INTRAVENOUS_CENTRAL
  Filled 2012-03-24 (×14): qty 5000

## 2012-03-24 MED ORDER — POTASSIUM CHLORIDE 20 MEQ/15ML (10%) PO LIQD
40.0000 meq | Freq: Once | ORAL | Status: AC
  Administered 2012-03-24: 40 meq

## 2012-03-24 MED ORDER — HEPARIN SODIUM (PORCINE) 1000 UNIT/ML DIALYSIS
1000.0000 [IU] | INTRAMUSCULAR | Status: DC | PRN
  Administered 2012-03-28: 4800 [IU] via INTRAVENOUS_CENTRAL
  Filled 2012-03-24 (×2): qty 6
  Filled 2012-03-24: qty 2
  Filled 2012-03-24: qty 6

## 2012-03-24 MED ORDER — SODIUM CHLORIDE 0.9 % IV SOLN
500.0000 mg | Freq: Two times a day (BID) | INTRAVENOUS | Status: DC
  Administered 2012-03-24 – 2012-03-28 (×9): 500 mg via INTRAVENOUS
  Filled 2012-03-24 (×13): qty 5

## 2012-03-24 MED ORDER — PRISMASOL BGK 4/2.5 32-4-2.5 MEQ/L IV SOLN
INTRAVENOUS | Status: DC
  Administered 2012-03-24 – 2012-03-28 (×15): via INTRAVENOUS_CENTRAL
  Filled 2012-03-24 (×28): qty 5000

## 2012-03-24 MED ORDER — HEPARIN (PORCINE) 2000 UNITS/L FOR CRRT
INTRAVENOUS_CENTRAL | Status: DC | PRN
  Filled 2012-03-24 (×2): qty 1000

## 2012-03-24 NOTE — Progress Notes (Signed)
Palliative Medicine Team SW Chart reviewed, pt discussed in PMT Rounds. Psychosocial follow up. Pt's eyes open today. Met with pt's son Dwayne at bedside. Extensive conversation about his coping and struggle with pt's wife's decision to not include him in pt's care planning. Son presents as very reasonable,  respectful of fact that wife is legal next of kin and the limitations of his involvement placed by wife. Son offers history of pt as well as the estrangement that has developed between pt's wife and himself. Son expressed concern for wife's mental state and ability to make rational choices for pt. Son reports past conversations with pt in which he stated wishes to not be supported artificially long term, "he wouldn't want all this". Son does express relief that pt appears to have exhibited some improvement in the past couple days. Provided emotional support as son is an only child struggling with two sick parents; son's mother (pt's 1st wife) currently undergoing tx for breast cancer. Will continue to support this difficult social situation.   [name], LCSWA PMT phone [phone]

## 2012-03-24 NOTE — Progress Notes (Signed)
Subjective: Patient continues to improve.  Family reports that the patient is nodding his head in response to questioning and has been seen to lift his right arm purposefully.  EEG performed on yesterday continues to show frequent polyspike discharges with a central predominance.    Objective: Current vital signs: BP 106/41  Pulse 86  Temp 97.3 F (36.3 C) (Oral)  Resp 18  Ht 5\' 5"  (1.651 m)  Wt 81.5 kg (179 lb 10.8 oz)  BMI 29.90 kg/m2  SpO2 100% Vital signs in last 24 hours: Temp:  [96.4 F (35.8 C)-97.3 F (36.3 C)] 97.3 F (36.3 C) (01/02 0739) Pulse Rate:  [80-88] 86  (01/02 1100) Resp:  [12-18] 18  (01/02 1100) BP: (70-162)/(30-82) 106/41 mmHg (01/02 1100) SpO2:  [99 %-100 %] 100 % (01/02 1100) FiO2 (%):  [40 %] 40 % (01/02 1100) Weight:  [81.5 kg (179 lb 10.8 oz)] 81.5 kg (179 lb 10.8 oz) (01/02 0400)  Intake/Output from previous day: 01/01 0701 - 01/02 0700 In: 2105 [I.V.:530; NG/GT:1150; IV Piggyback:425] Out: 860 [Urine:160; Stool:700] Intake/Output this shift: Total I/O In: 415 [I.V.:75; NG/GT:230; IV Piggyback:110] Out: 45 [Urine:45] Nutritional status:    Neurologic Exam: Mental Status: Alert.  Follows simple commands.  Opens eyes to command.  Squeezes my hand to command bilaterally.  Nods head appropriately.  Cranial Nerves: II: Discs flat bilaterally; Tracks examiner around the room, pupils equal, round, reactive to light and accommodation III,IV, VI: ptosis not present, extra-ocular motions intact bilaterally V,VII: corneals intact bilaterally, facial light touch sensation normal bilaterally VIII: hearing normal bilaterally IX,X: gag reflex present XI: bilateral shoulder shrug XII: midline tongue extension Motor: No lower extremity movement noted but able to squeeze my hands weakly to command.   Sensory: Responds to noxious sttimuli throughout, bilaterally Deep Tendon Reflexes: 2+ and symmetric throughout Plantars: Mute bilaterally  Lab  Results: Basic Metabolic Panel:  Lab 03/24/12 7846 03/23/12 1553 03/23/12 0445 03/22/12 0515 03/21/12 0500 03/19/12 1000  NA 136 137 140 136 135 --  K 2.9* 3.0* 3.0* 3.5 4.7 --  CL 99 100 100 99 98 --  CO2 23 23 24 24 23  --  GLUCOSE 164* 212* 226* 194* 136* --  BUN 71* 64* 57* 36* 46* --  CREATININE 3.69* 3.47* 3.26* 2.46* 3.58* --  CALCIUM 8.2* 8.3* 8.1* -- -- --  MG 2.3 2.4 -- 2.1 2.4 --  PHOS 4.9* -- 4.4 3.9 5.4* 3.6    Liver Function Tests:  Lab 03/24/12 0410 03/23/12 1553 03/21/12 0500 03/20/12 1220 03/19/12 1000  AST 18 20 29 27  --  ALT 6 7 8 7  --  ALKPHOS 105 107 164* 147* --  BILITOT 0.4 0.4 0.6 0.5 --  PROT 5.9* 6.0 6.9 6.2 --  ALBUMIN 1.7* 1.7* 2.0* 1.8* 2.0*   No results found for this basename: LIPASE:5,AMYLASE:5 in the last 168 hours No results found for this basename: AMMONIA:3 in the last 168 hours  CBC:  Lab 03/24/12 0410 03/23/12 1553 03/23/12 0445 03/22/12 0515 03/21/12 0500  WBC 11.5* 10.6* 9.3 9.9 7.1  NEUTROABS -- -- -- -- --  HGB 10.2* 10.2* 9.7* 9.9* 9.9*  HCT 33.0* 33.1* 31.5* 31.6* 33.6*  MCV 78.6 78.6 78.8 77.8* 79.4  PLT 166 173 167 175 152    Cardiac Enzymes:  Lab 03/20/12 1220  CKTOTAL --  CKMB --  CKMBINDEX --  TROPONINI 0.30*    Lipid Panel: No results found for this basename: CHOL:5,TRIG:5,HDL:5,CHOLHDL:5,VLDL:5,LDLCALC:5 in the last 168 hours  CBG:  Lab 03/24/12 0341 03/24/12 0034 03/23/12 2040 03/23/12 1643 03/23/12 1134  GLUCAP 160* 162* 186* 201* 189*    Microbiology: Results for orders placed during the hospital encounter of 03/06/2012  URINE CULTURE     Status: Normal   Collection Time   02/26/12  1:28 AM      Component Value Range Status Comment   Specimen Description URINE, RANDOM   Final    Special Requests Normal   Final    Culture  Setup Time 02/26/2012 04:22   Final    Colony Count >=100,000 COLONIES/ML   Final    Culture     Final    Value: ESCHERICHIA COLI     Note: Two isolates with different  morphologies were identified as the same organism.The most resistant organism was reported.   Report Status 02/29/2012 FINAL   Final    Organism ID, Bacteria ESCHERICHIA COLI   Final   BODY FLUID CULTURE     Status: Normal   Collection Time   03/01/12  2:09 PM      Component Value Range Status Comment   Specimen Description PLEURAL RIGHT EFFUSION   Final    Special Requests Normal   Final    Gram Stain     Final    Value: RARE WBC PRESENT, PREDOMINANTLY PMN     NO ORGANISMS SEEN   Culture NO GROWTH 3 DAYS   Final    Report Status 03/05/2012 FINAL   Final   URINE CULTURE     Status: Normal   Collection Time   03/02/12  3:14 PM      Component Value Range Status Comment   Specimen Description URINE, CLEAN CATCH   Final    Special Requests NONE   Final    Culture  Setup Time 03/03/2012 01:27   Final    Colony Count NO GROWTH   Final    Culture NO GROWTH   Final    Report Status 03/04/2012 FINAL   Final   URINE CULTURE     Status: Normal   Collection Time   03/09/12  6:14 AM      Component Value Range Status Comment   Specimen Description URINE, CLEAN CATCH   Final    Special Requests NONE   Final    Culture  Setup Time 03/09/2012 15:19   Final    Colony Count NO GROWTH   Final    Culture NO GROWTH   Final    Report Status 03/10/2012 FINAL   Final   URINE CULTURE     Status: Normal   Collection Time   02/24/2012  1:56 AM      Component Value Range Status Comment   Specimen Description URINE, CATHETERIZED   Final    Special Requests CX ADDED AT 0242 ON [id number]   Final    Culture  Setup Time 02/22/2012 02:49   Final    Colony Count NO GROWTH   Final    Culture NO GROWTH   Final    Report Status 03/12/2012 FINAL   Final   SURGICAL PCR SCREEN     Status: Abnormal   Collection Time   03/10/2012  5:16 AM      Component Value Range Status Comment   MRSA, PCR POSITIVE (*) NEGATIVE Final    Staphylococcus aureus POSITIVE (*) NEGATIVE Final   TISSUE CULTURE     Status: Normal    Collection Time   03/17/2012  4:43 PM  Component Value Range Status Comment   Specimen Description TISSUE PERICARDIUM   Final    Special Requests NONE   Final    Gram Stain     Final    Value: NO WBC SEEN     NO ORGANISMS SEEN   Culture NO GROWTH 3 DAYS   Final    Report Status 03/15/2012 FINAL   Final   BODY FLUID CULTURE     Status: Normal   Collection Time   03/18/2012  4:48 PM      Component Value Range Status Comment   Specimen Description FLUID PERICARDIAL   Final    Special Requests NONE   Final    Gram Stain     Final    Value: NO WBC SEEN     NO ORGANISMS SEEN   Culture NO GROWTH 3 DAYS   Final    Report Status 03/15/2012 FINAL   Final   BODY FLUID CULTURE     Status: Normal   Collection Time   03/15/2012  4:50 PM      Component Value Range Status Comment   Specimen Description FLUID RIGHT PLEURAL   Final    Special Requests NONE   Final    Gram Stain     Final    Value: RARE WBC PRESENT, PREDOMINANTLY MONONUCLEAR     NO ORGANISMS SEEN   Culture NO GROWTH 3 DAYS   Final    Report Status 03/15/2012 FINAL   Final   CULTURE, BLOOD (ROUTINE X 2)     Status: Normal (Preliminary result)   Collection Time   03/20/12 12:00 PM      Component Value Range Status Comment   Specimen Description BLOOD CENTRAL LINE   Final    Special Requests BOTTLES DRAWN AEROBIC ONLY 4CC   Final    Culture  Setup Time 03/20/2012 18:44   Final    Culture     Final    Value:        BLOOD CULTURE RECEIVED NO GROWTH TO DATE CULTURE WILL BE HELD FOR 5 DAYS BEFORE ISSUING A FINAL NEGATIVE REPORT   Report Status PENDING   Incomplete   CULTURE, RESPIRATORY     Status: Normal   Collection Time   03/20/12 12:50 PM      Component Value Range Status Comment   Specimen Description TRACHEAL ASPIRATE   Final    Special Requests Normal   Final    Gram Stain     Final    Value: RARE WBC PRESENT, PREDOMINANTLY PMN     NO SQUAMOUS EPITHELIAL CELLS SEEN     FEW YEAST WITH PSEUDOHYPHAE     RARE GRAM POSITIVE  RODS   Culture FEW CANDIDA ALBICANS   Final    Report Status 03/22/2012 FINAL   Final   CULTURE, BLOOD (ROUTINE X 2)     Status: Normal (Preliminary result)   Collection Time   03/20/12  1:30 PM      Component Value Range Status Comment   Specimen Description BLOOD A-LINE   Final    Special Requests BOTTLES DRAWN AEROBIC ONLY 8CC   Final    Culture  Setup Time 03/20/2012 18:44   Final    Culture     Final    Value:        BLOOD CULTURE RECEIVED NO GROWTH TO DATE CULTURE WILL BE HELD FOR 5 DAYS BEFORE ISSUING A FINAL NEGATIVE REPORT   Report Status PENDING  Incomplete   URINE CULTURE     Status: Normal   Collection Time   03/20/12  6:01 PM      Component Value Range Status Comment   Specimen Description URINE, CATHETERIZED   Final    Special Requests NONE   Final    Culture  Setup Time 03/21/2012 01:34   Final    Colony Count 80,000 COLONIES/ML   Final    Culture YEAST   Final    Report Status 03/21/2012 FINAL   Final   CLOSTRIDIUM DIFFICILE BY PCR     Status: Abnormal   Collection Time   03/21/12  4:00 AM      Component Value Range Status Comment   C difficile by pcr POSITIVE (*) NEGATIVE Final     Coagulation Studies: No results found for this basename: LABPROT:5,INR:5 in the last 72 hours  Imaging: Dg Chest Port 1 View  03/24/2012  *RADIOLOGY REPORT*  Clinical Data: Endotracheal tube position.  Atelectasis. Pericardial window procedure on 03-20-12 for pericardial effusion.  Atrial fibrillation.  Prior PEA arrest on December 29. Ischemic cardiomyopathy.  PORTABLE CHEST - 1 VIEW  Comparison: 03/23/2012  Findings: Endotracheal tube tip 3.3 cm above the carina.  Central venous catheters unchanged in position.  Nasogastric tube enters the stomach.  Transvenous pacer lead position unchanged.  Stable cardiomegaly noted with stable blunting of the right costophrenic angle.  Bilateral interstitial accentuation noted with mild patchy indistinct perihilar and basilar airspace  opacities. Opacity is slightly increased at the left lung base.  IMPRESSION:  1.  Similar appearance to prior exam, although the airspace opacity left lung base appears minimally increased.  The appearance of cardiomegaly and patchy edema favors congestive heart failure.   Original Report Authenticated By: Gaylyn Rong, M.D.    Dg Chest Port 1 View  03/23/2012  *RADIOLOGY REPORT*  Clinical Data: Follow-up pleural effusions.  PORTABLE CHEST - 1 VIEW  Comparison: 03/22/2012  Findings: Endotracheal tube is in place with tip 4.7 cm above carina.  Nasogastric tube is in place with tip off the film but beyond the gastroesophageal junction. Left IJ central line tip overlies the level of the superior vena cava.  Right-sided dual lumen central line tips overlie the superior vena cava - right atrial junction and upper right atrium.  Transvenous pacemaker has leads overlying the right ventricle and coronary sinus.  Heart is enlarged.  There are patchy bilateral lower lobe infiltrates and bilateral pleural effusions, not significantly changed.  IMPRESSION: Little interval change.   Original Report Authenticated By: Norva Pavlov, M.D.     Medications:  I have reviewed the patient's current medications. Scheduled:   . antiseptic oral rinse  15 mL Mouth Rinse QID  . chlorhexidine  15 mL Mouth Rinse BID  . darbepoetin (ARANESP) injection - NON-DIALYSIS  100 mcg Subcutaneous Q Wed-1800  . doxercalciferol  1 mcg Intravenous Q M,W,F-HD  . famotidine  20 mg Oral Daily  . feeding supplement (NEPRO CARB STEADY)  1,000 mL Per Tube Q24H  . feeding supplement  30 mL Per Tube BID  . heparin subcutaneous  5,000 Units Subcutaneous Q8H  . insulin aspart  0-9 Units Subcutaneous Q4H  . levetiracetam  1,000 mg Intravenous Q24H  . levetiracetam  500 mg Intravenous Q M,W,F-HD  . metoprolol tartrate  25 mg Per Tube BID  . metroNIDAZOLE  500 mg Per Tube Q8H  . multivitamin  1 tablet Oral QHS  . potassium chloride  40 mEq  Per  Tube Once  . simvastatin  40 mg Oral QHS  . sodium chloride  3 mL Intravenous Q12H  . Tamsulosin HCl  0.4 mg Oral QPC supper  . valproate sodium  1,000 mg Intravenous Q8H  . vancomycin  125 mg Oral QID    Assessment/Plan:  Patient Active Hospital Problem List:  Altered mental status (03/20/2012)   Assessment: Mental status continues to improve.  Follows more commands daily and is more alert.  EEG continues to be markedly abnormal.     Plan: Repeat EEG today Myoclonus (03/23/2012)   Assessment: Nursing reports rare myoclonic jerks.  None noted on examination today.     Plan: Depakote level pending.  Continue Depakote and Keppra at current dioses    LOS: 28 days   Thana Farr, MD [facility] [phone] 03/24/2012  11:19 AM

## 2012-03-24 NOTE — Procedures (Signed)
EEG NUMBER: [id number]  REFERRING PHYSICIAN: Dr. Molli Knock.   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  MEDICATIONS: Keppra, Depacon, heparin, NovoLog, Flagyl, Zocor, Lopressor.   CONDITIONS OF RECORDING: This is a 16-channel EEG carried out with the patient in the awake and drowsy states.   DESCRIPTION: The background activity is slow and poorly organized  consisting mostly of a delta rhythm that is continuous throughout the  tracing. There is superimposed polyspike activity that is seen  centrally and is most prominent in the central parietal and occipital  regions. This polyspike activity occurs at frequency of 1-2 hertz and  is persistent throughout the tracing. Hyperventilation was not  performed. Intermittent photic stimulation failed to elicit any change  in the tracing.   IMPRESSION: This is an abnormal EEG secondary to a polyspike activity  emanating from the central region. 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   COMMENT: This EEG is unchanged from the previous EEG of 03/23/12   ______________________________  Thana Farr, MD

## 2012-03-24 NOTE — Progress Notes (Signed)
eLink Physician-Brief Progress Note Patient Name: [name] DOB: 09/19/1945 MRN: [id number]  Date of Service  03/24/2012   HPI/Events of Note   Low K   eICU Interventions  Replace k    Intervention Category Major Interventions: Electrolyte abnormality - evaluation and management  Nelda Bucks. 03/24/2012, 6:03 AM

## 2012-03-24 NOTE — Procedures (Signed)
EEG NUMBER:  REFERRING PHYSICIAN:  Dr. Molli Knock.  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  MEDICATIONS:  Keppra, Depacon, heparin, NovoLog, Flagyl, Zocor, Lopressor.  CONDITIONS OF RECORDING:  This is a 16-channel EEG carried out with the patient in the unresponsive state.  DESCRIPTION:  The background activity is slow and poorly organized consisting mostly of a delta rhythm that is continuous throughout the tracing.  There is superimposed polyspike activity that is seen centrally and is most prominent in the central parietal and occipital regions.  This polyspike activity occurs at frequency of 1-2 hertz and is persistent throughout the tracing.  Hyperventilation was not performed.  Intermittent photic stimulation failed to elicit any change in the tracing.  IMPRESSION:  This is an abnormal EEG secondary to a polyspike activity emanating from the central region.  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          ______________________________ Thana Farr, MD    [id number] D:  03/24/2012 08:00:22  T:  03/24/2012 08:33:02  Job #:  [id number]

## 2012-03-24 NOTE — Progress Notes (Signed)
[facility]  Subjective:  Intubated, sedated, Nepro at 35/hr.  Not on pressors, but BP low Less myoclonus Palliative Care consult noted   Objective: Vital signs in last 24 hours: Blood pressure 85/52, pulse 88, temperature 96.4 F (35.8 C), temperature source Oral, resp. rate 14, height 5\' 5"  (1.651 m), weight 81.5 kg (179 lb 10.8 oz), SpO2 100.00%.    PHYSICAL EXAM General--as above Chest--rhonchi, pacer L subcl Heart--no rub Abd--nontender Extr--UE and LE edema noted, AVF (new) patent L forearm  Date   Weight  17 Dec  72 kg  20 Dec  73.9  23 Dec  77.9  26 Dec  74.5  30 Dec  80.6  31 Dec  78.3   1 Jan  81    2 Jan  81.5  Lab Results:   Lab 03/24/12 0410 03/23/12 1553 03/23/12 0445 03/22/12 0515  NA 136 137 140 --  K 2.9* 3.0* 3.0* --  CL 99 100 100 --  CO2 23 23 24  --  BUN 71* 64* 57* --  CREATININE 3.69* 3.47* 3.26* --  ALB -- -- -- --  GLUCOSE 164* -- -- --  CALCIUM 8.2* 8.3* 8.1* --  PHOS 4.9* -- 4.4 3.9     Basename 03/24/12 0410 03/23/12 1553  WBC 11.5* 10.6*  HGB 10.2* 10.2*  HCT 33.0* 33.1*  PLT 166 173     I have reviewed the patient's current medications. Scheduled:   . antiseptic oral rinse  15 mL Mouth Rinse QID  . chlorhexidine  15 mL Mouth Rinse BID  . darbepoetin (ARANESP) injection - NON-DIALYSIS  100 mcg Subcutaneous Q Wed-1800  . doxercalciferol  1 mcg Intravenous Q M,W,F-HD  . famotidine  20 mg Oral Daily  . feeding supplement (NEPRO CARB STEADY)  1,000 mL Per Tube Q24H  . feeding supplement  30 mL Per Tube BID  . heparin subcutaneous  5,000 Units Subcutaneous Q8H  . insulin aspart  0-9 Units Subcutaneous Q4H  . levetiracetam  1,000 mg Intravenous Q24H  . levetiracetam  500 mg Intravenous Q M,W,F-HD  . metoprolol tartrate  25 mg Per Tube BID  . metroNIDAZOLE  500 mg Per Tube Q8H  . multivitamin  1 tablet Oral QHS  . potassium chloride  40 mEq Per Tube Once  . simvastatin  40 mg Oral QHS  . sodium chloride  3  mL Intravenous Q12H  . Tamsulosin HCl  0.4 mg Oral QPC supper  . valproate sodium  1,000 mg Intravenous Q8H  . vancomycin  125 mg Oral QID    Assessment/Plan: 1. AKI superimposed on CKD-- (Cr 1.68 in Aug--2.8 on 12 Dec--Cr 3.69 today. HD 30 Dec). AVF placed in L forearm 27 Dec. CXR with B pl eff and bibasilar atelectasis.  With low BP, I'm reluctant to dialyze again--will begin CVVH  2. Ischemic cardiomyopathy/CHF--not responding to diuretics. Dialysis 30 Dec. CXR shows B pl eff and patchy B lower lobe infiltrates. Same plan as above 3. Pericardial effusion--S/P window 11 Mar 2012--no new suggestions  4. Afib, s/p nodal ablation + pacer--no new suggestions  5. S/P PEA arrest 29 Dec--poorly responsive. Neuro eval suggests poor px  6. + MRSA--on vanco (plus zosyn)  7. + C Diff--on flagyl  8. Anemia-- On Aranesp 100/wk. Hgb 9.9. Fe/TIBC 9 % sat with ferritin 131 on 17 Dec.--on IV Fe and Aranesp 100/wk  9. 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 Phosphorus 4.9, Ca 8.2 earlier today  LOS: 28 days   [name] F 03/24/2012,7:20 AM   .labalb

## 2012-03-24 NOTE — Progress Notes (Signed)
[facility] ADULT ICU REPLACEMENT PROTOCOL FOR AM LAB REPLACEMENT ONLY  The patient does not apply for the [facility] Adult ICU Electrolyte Replacment Protocol based on the criteria listed below:     Is BUN < 30 mg/dL? no  Patient's BUN today is 71   Abnormal electrolyte(s): K2.9)   If a panic level lab has been reported, has the CCM MD in charge been notified? yes.   Physician:  D. Truman Hayward, Charlynn Grimes 03/24/2012 5:03 AM

## 2012-03-24 NOTE — Progress Notes (Signed)
Name: [name] MRN: [id number] DOB: [birth year] LOS: 28  PCCM RESIDENT DAILY PROGRESS NOTE  History of Present Illness:  67 year old with history of alcohol abuse who presented to the hospital on December 6th with SOB, was found to have acute on chronic respiratory failure with EF of 40-45 percent who had an extremely complicated hospital course with pericardial effusion requiring a window, pleural effusion requiring a chest tube, renal failure requiring dialysis and liver failure from etoh. On 12/29 patient presented to the ICU after a 12 minute PEA cardiac arrest that was likely related to respiratory failure ?narcotic use for pain. Upon presenting to the ICU the patient was hypotensive and obtunded post code.  Events Since transfer to ICU:  12/29 Cardiac arrest.  12/30 Myoclonus > Neurology consulted  12/30 C diff positive  1/01 Follows simple commands.   Lines / Drains: ETT: 12/29>>>  R Diatek catheter>>>  L IJ TLC 12/29>>>  R radial a-line 12/29>>12/31   Cultures: 12/30 C.diff  12/30 Ucx > 80.000 yeast 12/29 Bcx>>uptodate no growth 12/29 Res cx: RARE WBC PRESENT, PREDOMINANTLY PMNNO SQUAMOUS EPITHELIAL CELLS SEEN FEW YEAST WITH PSEUDOHYPHAE RARE GRAM POSITIVE RODS 12/20 Right pleural fluid: RARE WBC PRESENT, PREDOMINANTLY MONONUCLEAR NO ORGANISMS SEEN 12/20 Pericardium culture; NO WBC SEEN NO ORGANISMS SEEN 12/20 Urine culture : negative  12/10 Pleural effusion: negative  12/6 Ucx: E.coli - Pansensitive   Antibiotics: Flagyl (12/31) >> Vancomycin (12/30)> > Zosyn 12/29>>>12/31   Tests / Events: 1. EEG 03/23/12 ; This is an abnormal EEG secondary to a polyspike activity emanating from the central region. 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  2. Palliative Care consult: Full code , Maintain full scope medical treatment   Overnight Events:   Vital Signs: Temp:  [96.4 F (35.8 C)-97.3 F (36.3 C)] 97.3 F (36.3 C) (01/02  0739) Pulse Rate:  [80-88] 88  (01/02 0700) Resp:  [12-18] 14  (01/02 0700) BP: (70-162)/(30-82) 85/52 mmHg (01/02 0700) SpO2:  [99 %-100 %] 100 % (01/02 0700) FiO2 (%):  [40 %] 40 % (01/02 0700) Weight:  [179 lb 10.8 oz (81.5 kg)] 179 lb 10.8 oz (81.5 kg) (01/02 0400) I/O last 3 completed shifts: In: 3266.7 [I.V.:1106.7; NG/GT:1625; IV Piggyback:535] Out: 1105 [Urine:205; Stool:900]  Physical Examination: General:  Ill appearing Neuro:    Does not follow commands HEENT:  ETT in place Cardiovascular:  s1s2 with 2/6 SM Lungs: scattered rhonchi  Abdomen:  Soft, non-tender Musculoskeletal:  2+ edema bilaterally. Skin:  Multiple bruises scattered throughout.   Ventilator settings: Vent Mode:  [-] PRVC FiO2 (%):  [40 %] 40 % Set Rate:  [14 bmp] 14 bmp Vt Set:  [600 mL] 600 mL PEEP:  [5 cmH20] 5 cmH20 Plateau Pressure:  [17 cmH20-25 cmH20] 18 cmH20  Basename 03/22/12 0523  PHART 7.415  PO2ART 154.0*  TCO2 27  HCO3 25.9*   Labs and Imaging:   Basic Metabolic Panel:  Lab 03/24/12 4098 03/23/12 1553 03/23/12 0445  NA 136 137 --  K 2.9* 3.0* --  CL 99 100 --  CO2 23 23 --  GLUCOSE 164* 212* --  BUN 71* 64* --  CREATININE 3.69* 3.47* --  CALCIUM 8.2* 8.3* --  MG 2.3 2.4 --  PHOS 4.9* -- 4.4   Liver Function Tests:  Lab 03/24/12 0410 03/23/12 1553  AST 18 20  ALT 6 7  ALKPHOS 105 107  BILITOT 0.4 0.4  PROT 5.9* 6.0  ALBUMIN 1.7* 1.7*  CBC:  Lab 03/24/12 0410 03/23/12 1553  WBC 11.5* 10.6*  NEUTROABS -- --  HGB 10.2* 10.2*  HCT 33.0* 33.1*  MCV 78.6 78.6  PLT 166 173   Cardiac Enzymes:  Lab 03/20/12 1220  CKTOTAL --  CKMB --  CKMBINDEX --  TROPONINI 0.30*    CBG:  Lab 03/24/12 0341 03/24/12 0034 03/23/12 2040 03/23/12 1643 03/23/12 1134 03/23/12 0738  GLUCAP 160* 162* 186* 201* 189* 217*   Coagulation:  Lab 03/21/12 0500 03/20/12 1220 03/22/2012 0852  LABPROT 18.5* 19.0* 18.8*  INR 1.59* 1.65* 1.63*   Anemia Panel:  Lab 03/21/12 1430    VITAMINB12 --  FOLATE --  FERRITIN 276  TIBC 154*  IRON 13*  RETICCTPCT --   Urinalysis:  Lab 03/20/12 1801  COLORURINE AMBER*  LABSPEC 1.020  PHURINE 5.0  GLUCOSEU NEGATIVE  HGBUR LARGE*  BILIRUBINUR SMALL*  KETONESUR 15*  PROTEINUR 100*  UROBILINOGEN 1.0  NITRITE NEGATIVE  LEUKOCYTESUR LARGE*      Lab 03/20/12 1230 03/20/12 1220  LATICACIDVEN 2.6* --  PROCALCITON -- 0.31    Assessment and Plan: PULMONARY  ASSESSMENT:Acute respiratory failure after PEA arrest.  Hx of COPD. CXRay 1/2  Similar appearance to prior exam, although the airspace opacity left lung base appears minimally increased.   PLAN:   Pressure support wean as tolerated  F/u CXR   CARDIOVASCULAR  ASSESSMENT: Shock 2nd to cardiac arrest, sepsis.  Hemodynamics improved.  Acute on chronic systolic CHF  Hx of A fib/flutter, CAD, HTN, Hyperlipidemia. D/C solucortef on 03/23/12 BP very labile   PLAN:  Keep even to negative fluid balance  Continue lopressor, simvastatin   RENAL  ASSESSMENT:  Renal failure on HD, ?hepatorenal syndrome.  Hypokalemia   PLAN:    HD per renal Repleting K   GASTROINTESTINAL  ASSESSMENT:  Hepatic failure in setting of ETOH and cardiac arrest.  Nutrition.  Diarrhea 2nd to C diff  PLAN:   1. Continue Flagyl 2. Continue TF   HEMATOLOGIC  ASSESSMENT:  Anemia/thrombocytopenia in setting of chronic disease, critical illness, and Hx of ETOH.  PLAN:  F/u CBC  SQ heparin.   INFECTIOUS Cultures:  Blood 12/29>>>  Urine 12/29>>>yeast  Sputum 12/29>>>  C. Diff 12/29>>>Positive  MRSA positive.   Antibiotics:  Vanc 12/29>>>  Zosyn 12/29>>>12/31  Flagyl 12/29>>>   ASSESSMENT:  C diff colitis.   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   PLAN:   Keppra, depakote per neuro  Best practices / Disposition: -->ICU status under PCCM -->full code -->Heparin for DVT Px -->Pepcid  for GI Px -->diet- TF   [name] 03/24/2012, 9:00 AM  The patient is critically ill with multiple organ systems failure and requires high complexity decision making for assessment and support, frequent evaluation and titration of therapies, application of advanced monitoring technologies and extensive interpretation of multiple databases. Critical Care Time devoted to patient care services described in this note is   35 minutes.  Reviewed above, examined pt, and agree with assessment/plan.  Mental status continues to slowly improve.  Remains on CRRT.  Not ready for vent weaning.  Seems to be responding to tx for C diff.  Coralyn Helling, MD [facility] Pulmonary/Critical Care 03/24/2012,  2:29 PM Pager:  [phone] After 3pm call: [phone]

## 2012-03-24 NOTE — Progress Notes (Signed)
EEG completed.

## 2012-03-25 ENCOUNTER — Other Ambulatory Visit: Payer: Self-pay | Admitting: Internal Medicine

## 2012-03-25 ENCOUNTER — Inpatient Hospital Stay (HOSPITAL_COMMUNITY): Payer: Medicare Other

## 2012-03-25 ENCOUNTER — Encounter: Payer: Self-pay | Admitting: Cardiothoracic Surgery

## 2012-03-25 LAB — RENAL FUNCTION PANEL
BUN: 49 mg/dL — ABNORMAL HIGH (ref 6–23)
BUN: 40 mg/dL — ABNORMAL HIGH (ref 6–23)
CO2: 25 mEq/L (ref 19–32)
CO2: 25 mEq/L (ref 19–32)
Calcium: 7.8 mg/dL — ABNORMAL LOW (ref 8.4–10.5)
Chloride: 102 mEq/L (ref 96–112)
Chloride: 103 mEq/L (ref 96–112)
Creatinine, Ser: 2.51 mg/dL — ABNORMAL HIGH (ref 0.50–1.35)
Creatinine, Ser: 1.95 mg/dL — ABNORMAL HIGH (ref 0.50–1.35)
Glucose, Bld: 110 mg/dL — ABNORMAL HIGH (ref 70–99)
Glucose, Bld: 118 mg/dL — ABNORMAL HIGH (ref 70–99)
Potassium: 3.4 mEq/L — ABNORMAL LOW (ref 3.5–5.1)

## 2012-03-25 LAB — GLUCOSE, CAPILLARY
Glucose-Capillary: 112 mg/dL — ABNORMAL HIGH (ref 70–99)
Glucose-Capillary: 116 mg/dL — ABNORMAL HIGH (ref 70–99)
Glucose-Capillary: 118 mg/dL — ABNORMAL HIGH (ref 70–99)

## 2012-03-25 LAB — CBC
HCT: 36.4 % — ABNORMAL LOW (ref 39.0–52.0)
Hemoglobin: 11.2 g/dL — ABNORMAL LOW (ref 13.0–17.0)
MCH: 24.2 pg — ABNORMAL LOW (ref 26.0–34.0)
MCV: 78.8 fL (ref 78.0–100.0)
RBC: 4.62 MIL/uL (ref 4.22–5.81)

## 2012-03-25 MED ORDER — DOXERCALCIFEROL 0.5 MCG PO CAPS
1.0000 ug | ORAL_CAPSULE | Freq: Every day | ORAL | Status: DC
  Administered 2012-03-25 – 2012-03-26 (×2): 1 ug via ORAL
  Filled 2012-03-25 (×3): qty 2

## 2012-03-25 MED ORDER — POTASSIUM CHLORIDE 20 MEQ/15ML (10%) PO LIQD
20.0000 meq | Freq: Once | ORAL | Status: AC
  Administered 2012-03-25: 20 meq via ORAL
  Filled 2012-03-25: qty 15

## 2012-03-25 MED ORDER — VALPROATE SODIUM 500 MG/5ML IV SOLN
1500.0000 mg | Freq: Three times a day (TID) | INTRAVENOUS | Status: DC
  Administered 2012-03-25 – 2012-03-29 (×12): 1500 mg via INTRAVENOUS
  Filled 2012-03-25 (×16): qty 15

## 2012-03-25 NOTE — Progress Notes (Signed)
Subjective: Although EEG continues to be consistent  With myoclonic status the patient continues to improve clinically.  He is more awake and alert today.    Objective: Current vital signs: BP 105/64  Pulse 82  Temp 96.5 F (35.8 C) (Core (Comment))  Resp 13  Ht 5\' 5"  (1.651 m)  Wt 79.5 kg (175 lb 4.3 oz)  BMI 29.17 kg/m2  SpO2 100% Vital signs in last 24 hours: Temp:  [94.3 F (34.6 C)-97.4 F (36.3 C)] 96.5 F (35.8 C) (01/03 1000) Pulse Rate:  [80-87] 82  (01/03 1000) Resp:  [3-27] 13  (01/03 1000) BP: (72-129)/(28-85) 105/64 mmHg (01/03 1000) SpO2:  [99 %-100 %] 100 % (01/03 1000) FiO2 (%):  [40 %-40.9 %] 40.9 % (01/03 1000) Weight:  [79.5 kg (175 lb 4.3 oz)] 79.5 kg (175 lb 4.3 oz) (01/03 0500)  Intake/Output from previous day: 01/02 0701 - 01/03 0700 In: 2200 [I.V.:455; NG/GT:1350; IV Piggyback:395] Out: 4767 [Urine:204; Stool:600] Intake/Output this shift: Total I/O In: 355 [I.V.:55; NG/GT:195; IV Piggyback:105] Out: 762 [Urine:20; Other:742] Nutritional status:    Neurologic Exam: Mental Status:  Alert. Follows simple commands. Opens eyes to command. Squeezes my hand to command bilaterally. Nods head appropriately.  Cranial Nerves:  II: Discs flat bilaterally; Tracks examiner around the room, pupils equal, round, reactive to light and accommodation  III,IV, VI: ptosis not present, extra-ocular motions intact bilaterally  V,VII: corneals intact bilaterally, facial light touch sensation normal bilaterally  VIII: hearing normal bilaterally  IX,X: gag reflex present  XI: bilateral shoulder shrug  XII: midline tongue extension  Motor:  Able to squeeze my hands weakly to command and lifts both upper extremities off the bed.  With pain flu stimulation reaches for my hand with his right hand across the bed and grimaces.  Sensory: Responds to noxious sttimuli throughout, bilaterally  Deep Tendon Reflexes: 2+ and symmetric throughout  Plantars:  Mute  bilaterally   Lab Results: Basic Metabolic Panel:  Lab 03/25/12 1610 03/24/12 1645 03/24/12 0410 03/23/12 1553 03/23/12 0445 03/22/12 0515 03/21/12 0500  NA 138 137 136 137 140 -- --  K 3.4* 3.4* 2.9* 3.0* 3.0* -- --  CL 102 101 99 100 100 -- --  CO2 25 24 23 23 24  -- --  GLUCOSE 110* 141* 164* 212* 226* -- --  BUN 49* 61* 71* 64* 57* -- --  CREATININE 2.51* 3.01* 3.69* 3.47* 3.26* -- --  CALCIUM 8.3* 8.1* 8.2* -- -- -- --  MG 2.4 -- 2.3 2.4 -- 2.1 2.4  PHOS 3.0 3.8 4.9* -- 4.4 3.9 --    Liver Function Tests:  Lab 03/25/12 0425 03/24/12 1645 03/24/12 0410 03/23/12 1553 03/21/12 0500 03/20/12 1220  AST -- -- 18 20 29 27   ALT -- -- 6 7 8 7   ALKPHOS -- -- 105 107 164* 147*  BILITOT -- -- 0.4 0.4 0.6 0.5  PROT -- -- 5.9* 6.0 6.9 6.2  ALBUMIN 1.9* 1.8* 1.7* 1.7* 2.0* --   No results found for this basename: LIPASE:5,AMYLASE:5 in the last 168 hours No results found for this basename: AMMONIA:3 in the last 168 hours  CBC:  Lab 03/25/12 0425 03/24/12 0410 03/23/12 1553 03/23/12 0445 03/22/12 0515  WBC 11.6* 11.5* 10.6* 9.3 9.9  NEUTROABS -- -- -- -- --  HGB 11.2* 10.2* 10.2* 9.7* 9.9*  HCT 36.4* 33.0* 33.1* 31.5* 31.6*  MCV 78.8 78.6 78.6 78.8 77.8*  PLT 150 166 173 167 175    Cardiac Enzymes:  Lab 03/20/12  1220  CKTOTAL --  CKMB --  CKMBINDEX --  TROPONINI 0.30*    Lipid Panel: No results found for this basename: CHOL:5,TRIG:5,HDL:5,CHOLHDL:5,VLDL:5,LDLCALC:5 in the last 168 hours  CBG:  Lab 03/25/12 0347 03/25/12 0037 03/24/12 1946 03/24/12 1629 03/24/12 1249  GLUCAP 112* 118* 146* 135* 143*    Microbiology: Results for orders placed during the hospital encounter of 03/01/2012  URINE CULTURE     Status: Normal   Collection Time   02/26/12  1:28 AM      Component Value Range Status Comment   Specimen Description URINE, RANDOM   Final    Special Requests Normal   Final    Culture  Setup Time 02/26/2012 04:22   Final    Colony Count >=100,000 COLONIES/ML    Final    Culture     Final    Value: ESCHERICHIA COLI     Note: Two isolates with different morphologies were identified as the same organism.The most resistant organism was reported.   Report Status 02/29/2012 FINAL   Final    Organism ID, Bacteria ESCHERICHIA COLI   Final   BODY FLUID CULTURE     Status: Normal   Collection Time   03/01/12  2:09 PM      Component Value Range Status Comment   Specimen Description PLEURAL RIGHT EFFUSION   Final    Special Requests Normal   Final    Gram Stain     Final    Value: RARE WBC PRESENT, PREDOMINANTLY PMN     NO ORGANISMS SEEN   Culture NO GROWTH 3 DAYS   Final    Report Status 03/05/2012 FINAL   Final   URINE CULTURE     Status: Normal   Collection Time   03/02/12  3:14 PM      Component Value Range Status Comment   Specimen Description URINE, CLEAN CATCH   Final    Special Requests NONE   Final    Culture  Setup Time 03/03/2012 01:27   Final    Colony Count NO GROWTH   Final    Culture NO GROWTH   Final    Report Status 03/04/2012 FINAL   Final   URINE CULTURE     Status: Normal   Collection Time   03/09/12  6:14 AM      Component Value Range Status Comment   Specimen Description URINE, CLEAN CATCH   Final    Special Requests NONE   Final    Culture  Setup Time 03/09/2012 15:19   Final    Colony Count NO GROWTH   Final    Culture NO GROWTH   Final    Report Status 03/10/2012 FINAL   Final   URINE CULTURE     Status: Normal   Collection Time   02/28/2012  1:56 AM      Component Value Range Status Comment   Specimen Description URINE, CATHETERIZED   Final    Special Requests CX ADDED AT 0242 ON 119147   Final    Culture  Setup Time 03/15/2012 02:49   Final    Colony Count NO GROWTH   Final    Culture NO GROWTH   Final    Report Status 03/12/2012 FINAL   Final   SURGICAL PCR SCREEN     Status: Abnormal   Collection Time   03/16/2012  5:16 AM      Component Value Range Status Comment   MRSA, PCR POSITIVE (*) NEGATIVE Final  Staphylococcus aureus POSITIVE (*) NEGATIVE Final   TISSUE CULTURE     Status: Normal   Collection Time   03/06/2012  4:43 PM      Component Value Range Status Comment   Specimen Description TISSUE PERICARDIUM   Final    Special Requests NONE   Final    Gram Stain     Final    Value: NO WBC SEEN     NO ORGANISMS SEEN   Culture NO GROWTH 3 DAYS   Final    Report Status 03/15/2012 FINAL   Final   BODY FLUID CULTURE     Status: Normal   Collection Time   03/15/2012  4:48 PM      Component Value Range Status Comment   Specimen Description FLUID PERICARDIAL   Final    Special Requests NONE   Final    Gram Stain     Final    Value: NO WBC SEEN     NO ORGANISMS SEEN   Culture NO GROWTH 3 DAYS   Final    Report Status 03/15/2012 FINAL   Final   BODY FLUID CULTURE     Status: Normal   Collection Time   02/22/2012  4:50 PM      Component Value Range Status Comment   Specimen Description FLUID RIGHT PLEURAL   Final    Special Requests NONE   Final    Gram Stain     Final    Value: RARE WBC PRESENT, PREDOMINANTLY MONONUCLEAR     NO ORGANISMS SEEN   Culture NO GROWTH 3 DAYS   Final    Report Status 03/15/2012 FINAL   Final   CULTURE, BLOOD (ROUTINE X 2)     Status: Normal (Preliminary result)   Collection Time   03/20/12 12:00 PM      Component Value Range Status Comment   Specimen Description BLOOD CENTRAL LINE   Final    Special Requests BOTTLES DRAWN AEROBIC ONLY 4CC   Final    Culture  Setup Time 03/20/2012 18:44   Final    Culture     Final    Value:        BLOOD CULTURE RECEIVED NO GROWTH TO DATE CULTURE WILL BE HELD FOR 5 DAYS BEFORE ISSUING A FINAL NEGATIVE REPORT   Report Status PENDING   Incomplete   CULTURE, RESPIRATORY     Status: Normal   Collection Time   03/20/12 12:50 PM      Component Value Range Status Comment   Specimen Description TRACHEAL ASPIRATE   Final    Special Requests Normal   Final    Gram Stain     Final    Value: RARE WBC PRESENT, PREDOMINANTLY PMN      NO SQUAMOUS EPITHELIAL CELLS SEEN     FEW YEAST WITH PSEUDOHYPHAE     RARE GRAM POSITIVE RODS   Culture FEW CANDIDA ALBICANS   Final    Report Status 03/22/2012 FINAL   Final   CULTURE, BLOOD (ROUTINE X 2)     Status: Normal (Preliminary result)   Collection Time   03/20/12  1:30 PM      Component Value Range Status Comment   Specimen Description BLOOD A-LINE   Final    Special Requests BOTTLES DRAWN AEROBIC ONLY 8CC   Final    Culture  Setup Time 03/20/2012 18:44   Final    Culture     Final    Value:  BLOOD CULTURE RECEIVED NO GROWTH TO DATE CULTURE WILL BE HELD FOR 5 DAYS BEFORE ISSUING A FINAL NEGATIVE REPORT   Report Status PENDING   Incomplete   URINE CULTURE     Status: Normal   Collection Time   03/20/12  6:01 PM      Component Value Range Status Comment   Specimen Description URINE, CATHETERIZED   Final    Special Requests NONE   Final    Culture  Setup Time 03/21/2012 01:34   Final    Colony Count 80,000 COLONIES/ML   Final    Culture YEAST   Final    Report Status 03/21/2012 FINAL   Final   CLOSTRIDIUM DIFFICILE BY PCR     Status: Abnormal   Collection Time   03/21/12  4:00 AM      Component Value Range Status Comment   C difficile by pcr POSITIVE (*) NEGATIVE Final     Coagulation Studies: No results found for this basename: LABPROT:5,INR:5 in the last 72 hours  Imaging: Dg Chest Port 1 View  03/24/2012  *RADIOLOGY REPORT*  Clinical Data: Endotracheal tube position.  Atelectasis. Pericardial window procedure on 16-Mar-2012 for pericardial effusion.  Atrial fibrillation.  Prior PEA arrest on December 29. Ischemic cardiomyopathy.  PORTABLE CHEST - 1 VIEW  Comparison: 03/23/2012  Findings: Endotracheal tube tip 3.3 cm above the carina.  Central venous catheters unchanged in position.  Nasogastric tube enters the stomach.  Transvenous pacer lead position unchanged.  Stable cardiomegaly noted with stable blunting of the right costophrenic angle.  Bilateral  interstitial accentuation noted with mild patchy indistinct perihilar and basilar airspace opacities. Opacity is slightly increased at the left lung base.  IMPRESSION:  1.  Similar appearance to prior exam, although the airspace opacity left lung base appears minimally increased.  The appearance of cardiomegaly and patchy edema favors congestive heart failure.   Original Report Authenticated By: Gaylyn Rong, M.D.     Medications:  I have reviewed the patient's current medications. Scheduled:   . antiseptic oral rinse  15 mL Mouth Rinse QID  . chlorhexidine  15 mL Mouth Rinse BID  . darbepoetin (ARANESP) injection - NON-DIALYSIS  100 mcg Subcutaneous Q Wed-1800  . doxercalciferol  1 mcg Oral Daily  . famotidine  20 mg Oral Daily  . feeding supplement (NEPRO CARB STEADY)  1,000 mL Per Tube Q24H  . feeding supplement  30 mL Per Tube BID  . heparin subcutaneous  5,000 Units Subcutaneous Q8H  . insulin aspart  0-9 Units Subcutaneous Q4H  . levetiracetam  500 mg Intravenous Q12H  . metoprolol tartrate  25 mg Per Tube BID  . metroNIDAZOLE  500 mg Per Tube Q8H  . multivitamin  1 tablet Oral QHS  . simvastatin  40 mg Oral QHS  . Tamsulosin HCl  0.4 mg Oral QPC supper  . valproate sodium  1,000 mg Intravenous Q8H  . vancomycin  125 mg Oral QID    Assessment/Plan:  Patient Active Hospital Problem List:  Altered mental status (03/20/2012)   Assessment: Improving.    Plan: Will continue to follow clinically Myoclonus (03/23/2012)   Assessment: Will treat patient and not EEG at this time since patient clinically improving.  Depakote level 42.7.   Plan: 1.  Increase Depakote to 1500mg  q8hrs  2. Depakote level in AM    LOS: 29 days   Thana Farr, MD [facility] [phone] 03/25/2012  10:55 AM

## 2012-03-25 NOTE — Progress Notes (Signed)
Palliative Medicine Team SW Received notice from pt's wife that she cannot make our scheduled regoal this afternoon. Reschedule for tomorrow Sat 1/4 at 4pm per wife request so that Renato Gails can attend. Have continued to provide emotional support to both wife and son who are at odds with one another. Continue to encourage resolution of this for pt's best interest. Clarified with pt's wife that she is okay with son having information/updates on pt's care, but does not want him present for GOC/decision-making meetings at this time. Will continue to follow.   [name], LCSWA PMT Phone [phone] Pager [phone]

## 2012-03-25 NOTE — Progress Notes (Signed)
Name: [name] MRN: [id number] DOB: November 16, 1945 LOS: 29  PCCM RESIDENT DAILY PROGRESS NOTE  History of Present Illness:  67 year old with history of alcohol abuse who presented to the hospital on December 6th with SOB, was found to have acute on chronic respiratory failure with EF of 40-45 percent who had an extremely complicated hospital course with pericardial effusion requiring a window, pleural effusion requiring a chest tube, renal failure requiring dialysis and liver failure from etoh. On 12/29 patient presented to the ICU after a 12 minute PEA cardiac arrest that was likely related to respiratory failure ?narcotic use for pain. Upon presenting to the ICU the patient was hypotensive and obtunded post code.  Events Since transfer to ICU:  12/29 Cardiac arrest.  12/30 Myoclonus > Neurology consulted  12/30 C diff positive  1/01  Follows simple commands.  Lines/Draines  ETT: 12/29>>  R Diatek catheter>> L IJ TLC 12/29>>>  R radial a-line 12/29>>12/31  Cultures:  12/30 C.diff  12/30 Ucx > 80.000 yeast  12/29 Bcx>>uptodate no growth  12/29 Res cx: RARE WBC PRESENT, PREDOMINANTLY PMNNO SQUAMOUS EPITHELIAL CELLS SEEN FEW YEAST WITH PSEUDOHYPHAE RARE GRAM POSITIVE RODS  12/20 Right pleural fluid: RARE WBC PRESENT, PREDOMINANTLY MONONUCLEAR NO ORGANISMS SEEN  12/20 Pericardium culture; NO WBC SEEN NO ORGANISMS SEEN  12/20 Urine culture : negative  12/10 Pleural effusion: negative  12/6 Ucx: E.coli - Pansensitive   Antibiotics:  Flagyl (12/31) >>  Vancomycin (12/30)> >  Zosyn 12/29>>>12/31  Tests / Events: 1. EEG 03/23/12 ; This is an abnormal EEG secondary to a polyspike activity emanating from the central region. 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  2. Palliative Care consult 03/23/12: Full code , Maintain full scope medical treatment 3. Repeat EEG 1/2: No change from 1/1    Overnight Events:  1. Following some commands 2. Diarrhea  : stable ( 400 cc over night) 3. Tolerating tube feeding well  4. Temp dropped down to 96.9 > now on bair hugger 5. After taking BP on arm reading is improved   Vital Signs: Temp:  [94.3 F (34.6 C)-97.3 F (36.3 C)] 97.3 F (36.3 C) (01/03 0600) Pulse Rate:  [80-88] 85  (01/03 0600) Resp:  [3-27] 18  (01/03 0600) BP: (72-129)/(28-85) 123/73 mmHg (01/03 0600) SpO2:  [99 %-100 %] 100 % (01/03 0600) FiO2 (%):  [40 %] 40 % (01/03 0415) Weight:  [175 lb 4.3 oz (79.5 kg)] 175 lb 4.3 oz (79.5 kg) (01/03 0500) I/O last 3 completed shifts: In: 3120 [I.V.:745; NG/GT:1780; IV Piggyback:595] Out: 2757 [Urine:265; FAOZH:0865; Stool:900]  Physical Examination: General:  Ill appearing  Neuro:  Did not follow commands for me, rn reports simple commands HEENT:  ETT in place  Cardiovascular:  s1s2 with 2/6 SM Lungs:  scattered rhonchi  Abdomen:  Soft, non-tender Musculoskeletal:  No edema  Skin:  Multiple bruises scattered throughout   Ventilator settings: Vent Mode:  [-] PRVC FiO2 (%):  [40 %] 40 % Set Rate:  [14 bmp] 14 bmp Vt Set:  [600 mL] 600 mL PEEP:  [5 cmH20] 5 cmH20 Pressure Support:  [10 cmH20] 10 cmH20 Plateau Pressure:  [13 cmH20-22 cmH20] 13 cmH20 No results found for this basename: PHART:3,PCO2:3,PO2ART:3,TCO2:3,HCO3:3 in the last 72 hours Labs and Imaging:   Basic Metabolic Panel:  Lab 03/25/12 7846 03/24/12 1645 03/24/12 0410  NA 138 137 --  K 3.4* 3.4* --  CL 102 101 --  CO2 25 24 --  GLUCOSE 110* 141* --  BUN 49* 61* --  CREATININE 2.51* 3.01* --  CALCIUM 8.3* 8.1* --  MG 2.4 -- 2.3  PHOS 3.0 3.8 --   Liver Function Tests:  Lab 03/25/12 0425 03/24/12 1645 03/24/12 0410 03/23/12 1553  AST -- -- 18 20  ALT -- -- 6 7  ALKPHOS -- -- 105 107  BILITOT -- -- 0.4 0.4  PROT -- -- 5.9* 6.0  ALBUMIN 1.9* 1.8* -- --    CBC:  Lab 03/25/12 0425 03/24/12 0410  WBC 11.6* 11.5*  NEUTROABS -- --  HGB 11.2* 10.2*  HCT 36.4* 33.0*  MCV 78.8 78.6  PLT 150 166    Cardiac Enzymes:  Lab 03/20/12 1220  CKTOTAL --  CKMB --  CKMBINDEX --  TROPONINI 0.30*    CBG:  Lab 03/25/12 0347 03/25/12 0037 03/24/12 1946 03/24/12 1629 03/24/12 1249 03/24/12 0738  GLUCAP 112* 118* 146* 135* 143* 165*    Coagulation:  Lab 03/21/12 0500 03/20/12 1220 02/24/2012 0852  LABPROT 18.5* 19.0* 18.8*  INR 1.59* 1.65* 1.63*   Anemia Panel:  Lab 03/21/12 1430  VITAMINB12 --  FOLATE --  FERRITIN 276  TIBC 154*  IRON 13*  RETICCTPCT --    Urinalysis:  Lab 03/20/12 1801  COLORURINE AMBER*  LABSPEC 1.020  PHURINE 5.0  GLUCOSEU NEGATIVE  HGBUR LARGE*  BILIRUBINUR SMALL*  KETONESUR 15*  PROTEINUR 100*  UROBILINOGEN 1.0  NITRITE NEGATIVE  LEUKOCYTESUR LARGE*      Lab 03/20/12 1230 03/20/12 1220  LATICACIDVEN 2.6* --  PROCALCITON -- 0.31    Chest xray 1/2 /14  PORTABLE CHEST - 1 VIEW  Comparison: 03/23/2012  Findings: Endotracheal tube tip 3.3 cm above the carina. Central  venous catheters unchanged in position. Nasogastric tube enters  the stomach. Transvenous pacer lead position unchanged.  Stable cardiomegaly noted with stable blunting of the right  costophrenic angle. Bilateral interstitial accentuation noted with  mild patchy indistinct perihilar and basilar airspace opacities.  Opacity is slightly increased at the left lung base.  IMPRESSION:  1. Similar appearance to prior exam, although the airspace opacity  left lung base appears minimally increased. The appearance of  cardiomegaly and patchy edema favors congestive heart failure.  Assessment and Plan: PULMONARY  ASSESSMENT: Acute respiratory failure after PEA arrest.  Hx of COPD.  PLAN:   Pressure support wean as tolerated, PS goal 5 , x 2 hrs, failed, PS increased F/u CXR, follow with neg balance Last abg reviewed  CARDIOVASCULAR  ASSESSMENT: Shock 2nd to cardiac arrest, sepsis, resolved Acute on chronic systolic CHF  Hx of A fib/flutter, CAD, HTN, Hyperlipidemia.    PLAN:  Keep even to negative fluid balance, tolerated well last 24 hrs Continue Simvastatin and metoprolol Anticoagulation in future , pending meeting   RENAL  ASSESSMENT:   Renal failure now on HD ( underwent CVVH yesterday) Hypokalemia   PLAN:   HD per renal  Monitor electrolytes and replete Continue neg balance, may help weaning process  GASTROINTESTINAL  ASSESSMENT:   Hepatic failure in setting of ETOH and cardiac arrest - improving  Nutrition. Albumin 1.9 Diarrhea 2nd to C diff  PLAN:   1. Continue Flagyl / oral vanc 2. Continue TF   HEMATOLOGIC  ASSESSMENT:  Anemia/thrombocytopenia in setting of chronic disease, critical illness, and Hx of ETOH- stable  Restarted Heparin SQ on 1/2 PLAN:  F/U  CBC coags in am in case we consider anticoagualtion  INFECTIOUS  Cultures:  Blood 12/29>>>  Urine 12/29>>>yeast  Sputum 12/29>>>  C. Diff 12/29>>>Positive  MRSA positive.   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 Is more alert with family  EEG 1/1: polyspike activity emanating from the central region consistent with a diagnosis of myoclonic status epilepticus.  EEG 1.2 : No change  PLAN:   Keppra, depakote per neuro CT reviewed as well lft in am on depakote PT in future  CLINICAL SUMMARY:   Best practices / Disposition: -->ICU status under PCCM -->full code -->Heparin for DVT Px -->Protonix for GI Px -->diet- TF  ILLATH,[name] 03/25/2012, 6:46 AM  The patient is critically ill with multiple organ systems failure and requires high complexity decision making for assessment and support, frequent evaluation and titration of therapies, application of advanced monitoring technologies and extensive  interpretation of multiple databases. Critical Care Time devoted to patient care services described in this note is   30 minutes.  Mcarthur Rossetti. Tyson Alias, MD, FACP Pgr: [phone] [facility]

## 2012-03-25 NOTE — Progress Notes (Signed)
[facility] ADULT ICU REPLACEMENT PROTOCOL FOR AM LAB REPLACEMENT ONLY  The patient does not apply for the [facility] Adult ICU Electrolyte Replacment Protocol based on the criteria listed below:   1. Is GFR >/= 50 ml/min? no  Patient's GFR today is 49 2. Is urine output >/= 0.5 ml/kg/hr for the last 8 hours? no Patient's UOP is  ml/kg/hr 3. Is BUN < 30 mg/dL? no  Patient's BUN today is 25 4. Abnormal electrolyte(s): *K 3.4 5. Ordered repletion with:  6. If a panic level lab has been reported, has the CCM MD in charge been notified? yes.   Physician:  Resident Dorothyann Gibbs  [facility], [place] Warwick A 03/25/2012 5:32 AM

## 2012-03-25 NOTE — Progress Notes (Signed)
Switched pt back to prvc mode to allow rest for the night.

## 2012-03-25 NOTE — Progress Notes (Signed)
[facility]  Subjective:  Continues to be more responsive-opens eyes this morning, no myoclonus noted, squeezes fingers. Off pressors  Spoke with RN who states BP cuff moved to R arm (previously restricted as had been using for mapping previously but now has L AVF) and BP overall improved corresponding to improvement I noted over last 12 hours.   Objective: Vital signs in last 24 hours: Blood pressure 123/73, pulse 85, temperature 97.3 F (36.3 C), temperature source Core (Comment), resp. rate 18, height 5\' 5"  (1.651 m), weight 175 lb 4.3 oz (79.5 kg), SpO2 100.00%.    PHYSICAL EXAM General--intubated, awakens to voice Chest--rhonchi, pacer L subcl. R sided tunnel HD cath.  Heart--somewhat distant heart sounds, regular rate, no rub Abd--nontender Extr--UE and LE edema noted, AVF (new) patent L forearm.   Date   Weight  17 Dec  72 kg  20 Dec  73.9  23 Dec  77.9  26 Dec  74.5  30 Dec  80.6  31 Dec  78.3   1 Jan  81    2 Jan  81.5 3 Jan   79.5  Lab Results:   Lab 03/25/12 0425 03/24/12 1645 03/24/12 0410  NA 138 137 136  K 3.4* 3.4* 2.9*  CL 102 101 99  CO2 25 24 23   BUN 49* 61* 71*  CREATININE 2.51* 3.01* 3.69*  ALB -- -- --  GLUCOSE 110* -- --  CALCIUM 8.3* 8.1* 8.2*  PHOS 3.0 3.8 4.9*     Basename 03/25/12 0425 03/24/12 0410  WBC 11.6* 11.5*  HGB 11.2* 10.2*  HCT 36.4* 33.0*  PLT 150 166     I have reviewed the patient's current medications. Scheduled:    . antiseptic oral rinse  15 mL Mouth Rinse QID  . chlorhexidine  15 mL Mouth Rinse BID  . darbepoetin (ARANESP) injection - NON-DIALYSIS  100 mcg Subcutaneous Q Wed-1800  . doxercalciferol  1 mcg Intravenous Q M,W,F-HD  . famotidine  20 mg Oral Daily  . feeding supplement (NEPRO CARB STEADY)  1,000 mL Per Tube Q24H  . feeding supplement  30 mL Per Tube BID  . heparin subcutaneous  5,000 Units Subcutaneous Q8H  . insulin aspart  0-9 Units Subcutaneous Q4H  . levetiracetam  500 mg  Intravenous Q12H  . metoprolol tartrate  25 mg Per Tube BID  . metroNIDAZOLE  500 mg Per Tube Q8H  . multivitamin  1 tablet Oral QHS  . potassium chloride  20 mEq Oral Once  . simvastatin  40 mg Oral QHS  . Tamsulosin HCl  0.4 mg Oral QPC supper  . valproate sodium  1,000 mg Intravenous Q8H  . vancomycin  125 mg Oral QID    Assessment/Plan: 1. AKI superimposed on CKD-- (Cr 1.68 in Aug--2.8 on 12 Dec--Cr 3.69 today. HD 30 Dec). AVF placed in L forearm 27 Dec. CXR with B pl eff and bibasilar atelectasis. .  *Hypokalemia-repleted per protocol 2. Ischemic cardiomyopathy/CHF--not responding to diuretics. Dialysis 30 Dec. CXR shows B pl eff and patchy B lower lobe infiltrates. Same plan as above 3. Pericardial effusion--S/P window 11 Mar 2012--no new suggestions  4. Afib, s/p nodal ablation + pacer--no new suggestions  5. S/P PEA arrest 29 Dec--neurologic status continues to improve. Markedly abnormal EEG persists. Patient is to be continued on Depakote and Keppra per neurology.  6. + C Diff--on flagyl and PO vanc 7. Anemia-- On Aranesp 100/wk. Hgb 9.9. Fe/TIBC 9 % sat with ferritin 131 on 17  Dec.--on IV Fe (12/31)and Aranesp 100/wk. Consider repeat IV Fe today.  8. Secondary PTH-- PTH 364 on 17 Dec-- on hectorol  1 mcg each HD. Phosphorus 3.0, Ca 8.3 earlier today     LOS: 29 days   Aldine Contes. Marti Sleigh, MD, PGY2 03/25/2012 7:38 AMI have seen and examined this patient and agree with plan   CVVH proceeding well.  Will continue and increase UF to 120 cc UF per hr.  Hectorol to be given via NG.Marina Gravel F,MD 03/25/2012 9:40 AM

## 2012-03-26 ENCOUNTER — Inpatient Hospital Stay (HOSPITAL_COMMUNITY): Payer: Medicare Other

## 2012-03-26 LAB — GLUCOSE, CAPILLARY
Glucose-Capillary: 113 mg/dL — ABNORMAL HIGH (ref 70–99)
Glucose-Capillary: 110 mg/dL — ABNORMAL HIGH (ref 70–99)
Glucose-Capillary: 110 mg/dL — ABNORMAL HIGH (ref 70–99)
Glucose-Capillary: 111 mg/dL — ABNORMAL HIGH (ref 70–99)
Glucose-Capillary: 101 mg/dL — ABNORMAL HIGH (ref 70–99)

## 2012-03-26 LAB — RENAL FUNCTION PANEL
Albumin: 2 g/dL — ABNORMAL LOW (ref 3.5–5.2)
BUN: 31 mg/dL — ABNORMAL HIGH (ref 6–23)
BUN: 27 mg/dL — ABNORMAL HIGH (ref 6–23)
Chloride: 104 mEq/L (ref 96–112)
Chloride: 100 mEq/L (ref 96–112)
Creatinine, Ser: 1.32 mg/dL (ref 0.50–1.35)
GFR calc Af Amer: 54 mL/min — ABNORMAL LOW (ref >90)
Glucose, Bld: 114 mg/dL — ABNORMAL HIGH (ref 70–99)
Glucose, Bld: 121 mg/dL — ABNORMAL HIGH (ref 70–99)
Phosphorus: 2.3 mg/dL (ref 2.3–4.6)
Potassium: 3.8 mEq/L (ref 3.5–5.1)
Potassium: 3.6 mEq/L (ref 3.5–5.1)
Sodium: 140 mEq/L (ref 135–145)

## 2012-03-26 LAB — CBC
HCT: 36.6 % — ABNORMAL LOW (ref 39.0–52.0)
Hemoglobin: 11.2 g/dL — ABNORMAL LOW (ref 13.0–17.0)
MCHC: 30.6 g/dL (ref 30.0–36.0)
RDW: 22.2 % — ABNORMAL HIGH (ref 11.5–15.5)
WBC: 10.3 10*3/uL (ref 4.0–10.5)

## 2012-03-26 LAB — CULTURE, BLOOD (ROUTINE X 2)

## 2012-03-26 LAB — HEPATIC FUNCTION PANEL
ALT: 9 U/L (ref 0–53)
Albumin: 2 g/dL — ABNORMAL LOW (ref 3.5–5.2)
Alkaline Phosphatase: 112 U/L (ref 39–117)
Indirect Bilirubin: 0.2 mg/dL — ABNORMAL LOW (ref 0.3–0.9)
Total Protein: 6.6 g/dL (ref 6.0–8.3)

## 2012-03-26 LAB — MAGNESIUM: Magnesium: 2.4 mg/dL (ref 1.5–2.5)

## 2012-03-26 MED ORDER — FERUMOXYTOL INJECTION 510 MG/17 ML
510.0000 mg | Freq: Once | INTRAVENOUS | Status: AC
  Administered 2012-03-26: 510 mg via INTRAVENOUS
  Filled 2012-03-26: qty 17

## 2012-03-26 NOTE — Progress Notes (Addendum)
Palliative Medicine Team Progress Note   S: Patient remains on the vent, unable to wean, following commands and more alert. Hypothermia and hypotension but tolerating CVVH. Complex family issues and difficulty with goals of care. Wife cancelled our follow-up meeting today. Son is at bedside and I have updated him on his father's condition. Wife unfortunately has requested that she be the only decision maker.  Filed Vitals:   03/26/12 0100  BP: 109/72  Pulse: 85  Temp: 97.7 F (36.5 C)  Resp: 14   Frail, alert on vent, critically ill.   Assessment: 67 yo man with multiple end stage chronic diseases, s/p cardiac arrest. PMT asked to consult due to difficulty with goal setting due to family dysfunction. He has an overall extremely poor prognosis for making a meaningful recovery-even back to a QOL that he before admission which was mostly debilitated and sedentary. He has evidence of anoxic brain injury or TME although he is alert and following basic commands, renal failure on CVVH, respiratory failure with difficult wean facing need for trach if full aggressive care is pursued. Per initial consult note -his wife is struggling with decision making and may not actually have capacity herself to make decisions, if she is unavailable to guide providers we may need to involve the son who is present at the bedside everyday and wants his father to be comfort measures given his prognosis and what the road ahead may look like for him. Wife however has told me that the son may be updated on patients condition but not allowed to make decision, may need an ethics consult to advise on how to handle medical surrogate situation.   Plan:  Remains full code, aggressive care. During initial goals wife was going to decide and meet back today- I am concerned she may become avoidant while she is "waiting on Jesus to raise him up out of the bed". Rescheduled for tomorrow at 4PM.   15 minutes. Greater than 50%  of  this time was spent counseling and coordinating care related to the above assessment and plan. Anderson Malta, DO Palliative Medicine

## 2012-03-26 NOTE — Progress Notes (Signed)
[facility]  Subjective:  Harder to arouse--on no sedatives Remains on vent and CVVH at -120/hr.  CVP 12 On no pressors   Objective: Vital signs in last 24 hours: Blood pressure 108/67, pulse 81, temperature 97.3 F (36.3 C), temperature source Core (Comment), resp. rate 12, height 5\' 5"  (1.651 m), weight 75.1 kg (165 lb 9.1 oz), SpO2 100.00%.    PHYSICAL EXAM General--as above Chest--rhonchi, pacer L subclavian Heart--no rub Abd--nontender Extr--UE and LE edema, AVF patent L forearm     Date   Weight  17 Dec  72 kg  20 Dec  73.9  23 Dec  77.9  26 Dec  74.5  30 Dec  80.6  31 Dec   78.3  1 Jan    81  2 Jan    81.5  3 Jan    79.5  4 jan  75.1 kg    Lab Results:   Lab 03/26/12 0440 03/25/12 1600 03/25/12 0425  NA 140 138 138  K 3.8 3.6 3.4*  CL 104 103 102  CO2 26 25 25   BUN 31* 40* 49*  CREATININE 1.50* 1.95* 2.51*  ALB -- -- --  GLUCOSE 114* -- --  CALCIUM 8.0* 7.8* 8.3*  PHOS 2.3 2.9 3.0     Basename 03/26/12 0440 03/25/12 0425  WBC 10.3 11.6*  HGB 11.2* 11.2*  HCT 36.6* 36.4*  PLT 117* 150     I have reviewed the patient's current medications. Scheduled:   . antiseptic oral rinse  15 mL Mouth Rinse QID  . chlorhexidine  15 mL Mouth Rinse BID  . darbepoetin (ARANESP) injection - NON-DIALYSIS  100 mcg Subcutaneous Q Wed-1800  . doxercalciferol  1 mcg Oral Daily  . famotidine  20 mg Oral Daily  . feeding supplement (NEPRO CARB STEADY)  1,000 mL Per Tube Q24H  . feeding supplement  30 mL Per Tube BID  . heparin subcutaneous  5,000 Units Subcutaneous Q8H  . insulin aspart  0-9 Units Subcutaneous Q4H  . levetiracetam  500 mg Intravenous Q12H  . metoprolol tartrate  25 mg Per Tube BID  . metroNIDAZOLE  500 mg Per Tube Q8H  . multivitamin  1 tablet Oral QHS  . simvastatin  40 mg Oral QHS  . Tamsulosin HCl  0.4 mg Oral QPC supper  . valproate sodium  1,500 mg Intravenous Q8H  . vancomycin  125 mg Oral QID   Continuous:   .  sodium chloride 20 mL/hr at 03/26/12 0400  . dialysis replacement fluid (prismasate) 500 mL/hr at 03/26/12 0508  . dialysis replacement fluid (prismasate) 500 mL/hr at 03/26/12 0508  . dialysate (PRISMASATE) 1,000 mL/hr at 03/26/12 0508    Assessment/Plan: 1. AKI superimposed on CKD-- (Cr 1.68 in Aug--2.8 on 12 Dec--Cr 1.5 today. HD 30 Dec). AVF placed in L forearm 27 Dec. CXR with B pl eff and bibasilar atelectasis. .  *Hypokalemia-repleted per protocol k 3.8 today 2. Ischemic cardiomyopathy/CHF--did not respond to diuretics. Dialysis 30 Dec. CXR showed B pl eff and patchy B lower lobe infiltrates. Same plan as above  3. Pericardial effusion--S/P window 11 Mar 2012--no new suggestions  4. Afib, s/p nodal ablation + pacer--no new suggestions  5. S/P PEA arrest 29 Dec--neurologic status continues to improve. Markedly abnormal EEG persists. Patient is to be continued on Depakote and Keppra per neurology.  6. + C Diff--on flagyl and PO vanc  7. Anemia-- On Aranesp 100/wk. Hgb 11.2. Fe/TIBC 9 % sat with ferritin 131 on  17 Dec.--on IV Fe (12/31)and Aranesp 100/wk. Repeat IV Fe today.  8. Secondary PTH-- PTH 364 on 17 Dec-- on hectorol  1 mcg each HD. Phosphorus 2.3, Ca 8 earlier today 9.  VDRF--02 sat 100% on 40% CPAP (weaning), but not mentally alert--per CCM     LOS: 30 days   Illa Enlow F 03/26/2012,9:36 AM   .labalb

## 2012-03-26 NOTE — Progress Notes (Signed)
Name: [name] MRN: [id number] DOB: 1945-07-24 LOS: 30  PCCM RESIDENT DAILY PROGRESS NOTE  History of Present Illness:  67 year old with history of alcohol abuse who presented to the hospital on December 6th with SOB, was found to have acute on chronic respiratory failure with EF of 40-45 percent who had an extremely complicated hospital course with pericardial effusion requiring a window, pleural effusion requiring a chest tube, renal failure requiring dialysis and liver failure from etoh. On 12/29 patient presented to the ICU after a 12 minute PEA cardiac arrest that was likely related to respiratory failure ?narcotic use for pain. Upon presenting to the ICU the patient was hypotensive and obtunded post code.  Lines/Draines  ETT: 12/29>>  R Diatek catheter>> L IJ TLC 12/29>>>  R radial a-line 12/29>>12/31  Cultures:  12/30 C.diff  12/30 Ucx > 80.000 yeast  12/29 Bcx>>uptodate no growth  12/29 Res cx: RARE WBC PRESENT, PREDOMINANTLY PMNNO SQUAMOUS EPITHELIAL CELLS SEEN FEW YEAST WITH PSEUDOHYPHAE RARE GRAM POSITIVE RODS  12/20 Right pleural fluid: RARE WBC PRESENT, PREDOMINANTLY MONONUCLEAR NO ORGANISMS SEEN  12/20 Pericardium culture; NO WBC SEEN NO ORGANISMS SEEN  12/20 Urine culture : negative  12/10 Pleural effusion: negative  12/6 Ucx: E.coli - Pansensitive   Antibiotics:  Flagyl (12/31) >>  Vancomycin (12/30)> >  Zosyn 12/29>>>12/31  Tests / Events: 1. EEG 03/23/12 ; This is an abnormal EEG secondary to a polyspike activity emanating from the central region. 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  2. Palliative Care consult 03/23/12: Full code , Maintain full scope medical treatment 3. Repeat EEG 1/2: No change from 1/1   Vital Signs: Temp:  [96.2 F (35.7 C)-98.5 F (36.9 C)] 96.7 F (35.9 C) (01/04 1300) Pulse Rate:  [79-87] 83  (01/04 1300) Resp:  [12-24] 13  (01/04 1300) BP: (98-126)/(53-79) 102/67 mmHg (01/04 1300) SpO2:   [100 %] 100 % (01/04 1300) FiO2 (%):  [40 %-41 %] 40.7 % (01/04 1300) Weight:  [75.1 kg (165 lb 9.1 oz)] 75.1 kg (165 lb 9.1 oz) (01/04 0401) I/O last 3 completed shifts: In: 3265 [I.V.:695; NG/GT:1940; IV Piggyback:630] Out: 8373 [Urine:277; WGNFA:2130; Stool:1350]  Physical Examination: General:  Ill appearing  Neuro:  Follows commands HEENT:  ETT in place  Cardiovascular:  s1s2 with 2/6 SM Lungs:  scattered rhonchi  Abdomen:  Soft, non-tender Musculoskeletal:  No edema  Skin:  Multiple bruises scattered throughout   Ventilator settings: Vent Mode:  [-] PSV FiO2 (%):  [40 %-41 %] 40.7 % Set Rate:  [14 bmp] 14 bmp Vt Set:  [600 mL] 600 mL PEEP:  [5 cmH20] 5 cmH20 Pressure Support:  [8 cmH20] 8 cmH20 Plateau Pressure:  [16 cmH20-23 cmH20] 23 cmH20 No results found for this basename: PHART:3,PCO2:3,PO2ART:3,TCO2:3,HCO3:3 in the last 72 hours :   Basic Metabolic Panel:  Lab 03/26/12 8657 03/25/12 1600 03/25/12 0425  NA 140 138 --  K 3.8 3.6 --  CL 104 103 --  CO2 26 25 --  GLUCOSE 114* 118* --  BUN 31* 40* --  CREATININE 1.50* 1.95* --  CALCIUM 8.0* 7.8* --  MG 2.4 -- 2.4  PHOS 2.3 2.9 --   Liver Function Tests:  Lab 03/26/12 1130 03/26/12 0440 03/24/12 0410  AST 22 -- 18  ALT 9 -- 6  ALKPHOS 112 -- 105  BILITOT 0.4 -- 0.4  PROT 6.6 -- 5.9*  ALBUMIN 2.0* 2.0* --    CBC:  Lab 03/26/12 0440 03/25/12  0425  WBC 10.3 11.6*  NEUTROABS -- --  HGB 11.2* 11.2*  HCT 36.6* 36.4*  MCV 79.9 78.8  PLT 117* 150   Cardiac Enzymes:  Lab 03/20/12 1220  CKTOTAL --  CKMB --  CKMBINDEX --  TROPONINI 0.30*    CBG:  Lab 03/26/12 0736 03/26/12 0343 03/26/12 0040 03/25/12 2020 03/25/12 1551 03/25/12 1249  GLUCAP 110* 113* 118* 117* 123* 136*    Coagulation:  Lab 03/26/12 0440 03/21/12 0500 03/20/12 1220  LABPROT 18.0* 18.5* 19.0*  INR 1.54* 1.59* 1.65*   Anemia Panel:  Lab 03/21/12 1430  VITAMINB12 --  FOLATE --  FERRITIN 276  TIBC 154*  IRON 13*    RETICCTPCT --    Urinalysis:  Lab 03/20/12 1801  COLORURINE AMBER*  LABSPEC 1.020  PHURINE 5.0  GLUCOSEU NEGATIVE  HGBUR LARGE*  BILIRUBINUR SMALL*  KETONESUR 15*  PROTEINUR 100*  UROBILINOGEN 1.0  NITRITE NEGATIVE  LEUKOCYTESUR LARGE*      Lab 03/20/12 1230 03/20/12 1220  LATICACIDVEN 2.6* --  PROCALCITON -- 0.31  Dg Chest Port 1 View  03/26/2012  *RADIOLOGY REPORT*  Clinical Data: Acute respiratory failure on ventilator.  The congestive heart failure.  Atrial fibrillation.  COPD.  PORTABLE CHEST - 1 VIEW  Comparison: 03/24/2012  Findings: Cardiomegaly and diffuse interstitial edema pattern shows no significant change.  Small bilateral pleural effusion as bibasilar atelectasis are also stable.  Support apparatus remains in appropriate position.  No pneumothorax identified.  IMPRESSION: Stable congestive heart failure and small bilateral pleural effusions.  No significant interval change.   Original Report Authenticated By: Myles Rosenthal, M.D.    Assessment and Plan: PULMONARY  ASSESSMENT: Acute respiratory failure after PEA arrest.  Hx of COPD.  PLAN:   Pressure support wean as tolerated, await direction on reintubation status in future If continued support aggressive = TRACH monday F/u CXR, follow with neg balance, successful over 3.3 liters  CARDIOVASCULAR  ASSESSMENT: Shock 2nd to cardiac arrest, sepsis, resolved Acute on chronic systolic CHF  Hx of A fib/flutter, CAD, HTN, Hyperlipidemia.   PLAN:  Keep even to negative fluid balance, tolerated well last 24 hrs Continue Simvastatin and metoprolol Anticoagulation in future , pending meeting   RENAL  ASSESSMENT:   Renal failure now on HD ( underwent CVVH yesterday) Hypokalemia   PLAN:   HD per renal  Monitor electrolytes and replete Continue neg balance, may help weaning process,goal to reduce pS to 5  GASTROINTESTINAL  ASSESSMENT:   Hepatic failure in setting of ETOH and cardiac arrest - improving   Nutrition. Albumin 1.9 Diarrhea 2nd to C diff  PLAN:   1. Continue Flagyl / oral vanc 2. 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 Is more alert with family  1-4 responds to voice. EEG 1/1: polyspike activity emanating from the central region consistent with a diagnosis of myoclonic status epilepticus.  EEG 1.2 : No change   PLAN:  Keppra, depakote per neuro Pall care meeting pending  CLINICAL SUMMARY:   Best practices / Disposition: -->ICU status under PCCM -->full code -->Heparin for DVT Px -->Protonix for GI Px -->diet- TF  Brett Canales Minor ACNP [facility] PCCM Pager [phone] till 3 pm If no answer page [phone] 03/26/2012, 1:48 PM  The patient is critically ill with multiple organ systems failure and requires high complexity decision making for assessment and support, frequent evaluation and titration of therapies, application of advanced monitoring technologies and extensive interpretation of multiple databases. Critical Care Time devoted to patient care services described in this note is   30 minutes.   Mcarthur Rossetti. Tyson Alias, MD, FACP Pgr: [phone] [facility]
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
## 2012-03-27 ENCOUNTER — Inpatient Hospital Stay (HOSPITAL_COMMUNITY): Payer: Medicare Other

## 2012-03-27 LAB — CBC
Hemoglobin: 11.1 g/dL — ABNORMAL LOW (ref 13.0–17.0)
MCHC: 29.9 g/dL — ABNORMAL LOW (ref 30.0–36.0)
Platelets: 102 10*3/uL — ABNORMAL LOW (ref 150–400)
RDW: 22.2 % — ABNORMAL HIGH (ref 11.5–15.5)

## 2012-03-27 LAB — RENAL FUNCTION PANEL
BUN: 23 mg/dL (ref 6–23)
CO2: 27 mEq/L (ref 19–32)
Calcium: 8.2 mg/dL — ABNORMAL LOW (ref 8.4–10.5)
Chloride: 100 mEq/L (ref 96–112)
Creatinine, Ser: 1.07 mg/dL (ref 0.50–1.35)
GFR calc non Af Amer: 70 mL/min — ABNORMAL LOW (ref >90)

## 2012-03-27 LAB — BASIC METABOLIC PANEL
Calcium: 8.2 mg/dL — ABNORMAL LOW (ref 8.4–10.5)
Creatinine, Ser: 1.03 mg/dL (ref 0.50–1.35)
GFR calc Af Amer: 85 mL/min — ABNORMAL LOW (ref >90)
GFR calc non Af Amer: 74 mL/min — ABNORMAL LOW (ref >90)

## 2012-03-27 LAB — MAGNESIUM: Magnesium: 2.3 mg/dL (ref 1.5–2.5)

## 2012-03-27 LAB — GLUCOSE, CAPILLARY
Glucose-Capillary: 104 mg/dL — ABNORMAL HIGH (ref 70–99)
Glucose-Capillary: 107 mg/dL — ABNORMAL HIGH (ref 70–99)
Glucose-Capillary: 122 mg/dL — ABNORMAL HIGH (ref 70–99)

## 2012-03-27 NOTE — Progress Notes (Signed)
[facility]  Subjective:  Patient with eyes open. Follows simple commands such as close and open eyes and wiggle toes.  Remains on vent and CVVH at -160 hr. CVP 8 most recent per nursing. No pressors.  CXR stilll with interestitial edema and effusions.    Objective: Vital signs in last 24 hours: Blood pressure 115/69, pulse 82, temperature 98 F (36.7 C), temperature source Core (Comment), resp. rate 17, height 5\' 5"  (1.651 m), weight 154 lb 12.2 oz (70.2 kg), SpO2 100.00%.    PHYSICAL EXAM General--as above Chest--rhonchi, pacer L subclavian. R HD tunnel cath.  Heart--no rub, regular rate and rhytm Abd--nontender Extr--UE and LE edema slightly improved, AVF patent L forearm     Date   Weight  17 Dec  72 kg  20 Dec  73.9  23 Dec  77.9  26 Dec  74.5  30 Dec  80.6  31 Dec   78.3  1 Jan    81  2 Jan    81.5  3 Jan    79.5  4 jan  75.1 kg 5 Jan  70.2 kg    Lab Results:   Lab 03/27/12 0423 03/26/12 1600 03/26/12 0440  NA 135 136 140  K 3.6 3.6 3.8  CL 100 100 104  CO2 27 27 26   BUN 23 27* 31*  CREATININE 1.07 1.32 1.50*  ALB -- -- --  GLUCOSE 109* -- --  CALCIUM 8.2* 8.1* 8.0*  PHOS 2.2* 3.0 2.3     Basename 03/27/12 0423 03/26/12 0440  WBC 8.6 10.3  HGB 11.1* 11.2*  HCT 37.1* 36.6*  PLT 102* 117*     I have reviewed the patient's current medications. Scheduled:    . antiseptic oral rinse  15 mL Mouth Rinse QID  . chlorhexidine  15 mL Mouth Rinse BID  . darbepoetin (ARANESP) injection - NON-DIALYSIS  100 mcg Subcutaneous Q Wed-1800  . doxercalciferol  1 mcg Oral Daily  . famotidine  20 mg Oral Daily  . feeding supplement (NEPRO CARB STEADY)  1,000 mL Per Tube Q24H  . feeding supplement  30 mL Per Tube BID  . insulin aspart  0-9 Units Subcutaneous Q4H  . levetiracetam  500 mg Intravenous Q12H  . metoprolol tartrate  25 mg Per Tube BID  . metroNIDAZOLE  500 mg Per Tube Q8H  . multivitamin  1 tablet Oral QHS  . simvastatin  40 mg  Oral QHS  . Tamsulosin HCl  0.4 mg Oral QPC supper  . valproate sodium  1,500 mg Intravenous Q8H  . vancomycin  125 mg Oral QID   Continuous:    . sodium chloride 20 mL/hr at 03/26/12 0400  . dialysis replacement fluid (prismasate) 500 mL/hr at 03/27/12 0247  . dialysis replacement fluid (prismasate) 500 mL/hr at 03/27/12 0247  . dialysate (PRISMASATE) 1,000 mL/hr at 03/27/12 0245    Assessment/Plan: 1. AKI superimposed on CKD-- (Cr 1.68 in Aug--2.8 on 12 Dec--Cr 1.5 today. HD 30 Dec). AVF placed in L forearm 27 Dec. CXR with B pl eff and bibasilar interstitial edema persistent 1/5 slightly decreased.  *would continue -160 hr on CVVH as long as BP  (111/63 most recent) and CVP (8) allow given CXR. 164 cc urine out over last 24 hr. May be able to switch to intermittent HD tomorrow. 2. Ischemic cardiomyopathy/CHF--did not respond to diuretics. Dialysis started 30 Dec. CXR showed B pl eff and patchy B lower lobe infiltrates. Same plan as above  3.  Pericardial effusion--S/P window 11 Mar 2012--no new suggestions  4. Afib, s/p nodal ablation + pacer--no new suggestions  5. S/P PEA arrest 29 Dec--neurologic status stable from previous progression. Markedly abnormal EEG persists. Patient is to be continued on Depakote and Keppra per neurology. Long term planning per palliative and ethics consult regarding code status.  6. + C Diff--on flagyl and PO vanc  7. Anemia-- On Aranesp 100/wk. Hgb 11.1. Fe/TIBC 9 % sat with ferritin 131 on 17 Dec.--on IV Fe (12/31 + 1/4)and Aranesp 100/wk. 8. Secondary PTH-- PTH 364 on 17 Dec-- on hectorol  1 mcg each HD. Phosphorus 2.3, Ca 8 earlier today 9.  VDRF--02 sat 100% on 40% CPAP (weaning), but not mentally alert--per CCM 10. Thrombocytopenia-Heparin has been stopped. SCDs only.      LOS: 31 days   Aldine Contes. Marti Sleigh, MD, PGY2 03/27/2012 7:45 AMI have seen and examined this patient and agree with plan.  CVVH proceeding well at (-) 160/hr UF.  Weight down.   Mental status not great, but better than earlier in the week.  May start HD tomorrow--off pressors. Suellen Durocher F,MD 03/27/2012 10:15 AM

## 2012-03-27 NOTE — Progress Notes (Signed)
PCCM RESIDENT DAILY PROGRESS NOTE   Name: [name] MRN: [id number] DOB: 30-Nov-1945    LOS: 31   BRIEF PATIENT DESCRIPTION:  67 year old with history of alcohol abuse who presented to the hospital on December 6th with SOB, was found to have acute on chronic respiratory failure with EF of 40-45 percent who had an extremely complicated hospital course with pericardial effusion requiring a window, pleural effusion requiring a chest tube, renal failure requiring dialysis and liver failure from etoh. On 12/29 patient presented to the ICU after a 12 minute PEA cardiac arrest that was likely related to respiratory failure ?narcotic use for pain. Upon presenting to the ICU the patient was hypotensive and obtunded post code.  Lines/Draines  ETT: 12/29>>  R Diatek catheter>>  L IJ TLC 12/29>>>  R radial a-line 12/29>>12/31   Cultures:  12/30 C.diff  12/30 Ucx > 80.000 yeast  12/29 Bcx>>uptodate no growth  12/29 Res cx: RARE WBC PRESENT, PREDOMINANTLY PMNNO SQUAMOUS EPITHELIAL CELLS SEEN FEW YEAST WITH PSEUDOHYPHAE RARE GRAM POSITIVE RODS  12/20 Right pleural fluid: RARE WBC PRESENT, PREDOMINANTLY MONONUCLEAR NO ORGANISMS SEEN  12/20 Pericardium culture; NO WBC SEEN NO ORGANISMS SEEN  12/20 Urine culture : negative  12/10 Pleural effusion: negative  12/6 Ucx: E.coli - Pansensitive   Antibiotics:  Flagyl (12/31) >>  Vancomycin (12/30)> >  Zosyn 12/29>>>12/31   Tests / Events:  1. EEG 03/23/12 ; This is an abnormal EEG secondary to a polyspike activity emanating from the central region. 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  2. Palliative Care consult 03/23/12: Full code , Maintain full scope medical treatment  3. Repeat EEG 1/2: No change from 1/1  4. Palliative Care consult 03/26/12: Full code , Maintain full scope medical treatment . Except wife everyone else who was present during meeting ( son, pastor ) feels that patient does not want current  quality of life.  It seems wife has not capacity to make decisions. Need for Ethic Consult .   Overnight events : 1. Palliative Meeting: Except wife everyone else who was present during meeting ( son, pastor )  feels that patient does not want current quality of life.  It seems wife has not capacity to make decisions. Need for Ethic Consult . Continue current medical therapy per wife.  2. No need of bair hugger - temp stable  3. Follows commands, response slow but present    CONSULTANTS:  Nephrology, Neurology,  Palliative  CODE STATUS: Full  DIET:  TF DVT Px:  SCD GI Px:  Pepcid    Physical Examination:  General: awake  Neuro: Follows commands but very slow, able to open and close eyes, move toes  HEENT: ETT in place  Cardiovascular: s1s2 with 2/6 SM  Lungs: improved air movement, decreased breath sounds at the basis, no wheezing  Abdomen: Soft, non-tender  Musculoskeletal: Left arm mildly swollen  Skin: Multiple bruises scattered throughout   Allergies  Allergen Reactions  . Aspirin     REACTION: Swelling, trouble breathing  . Tetracycline Hcl     REACTION: Swelling, trouble breathing   Vitals :  Temp:  [96.2 F (35.7 C)-98.2 F (36.8 C)] 98.2 F (36.8 C) (01/05 0600) Pulse Rate:  [80-87] 82  (01/05 0600) Resp:  [12-21] 21  (01/05 0600) BP: (98-120)/(53-75) 98/59 mmHg (01/05 0600) SpO2:  [100 %] 100 % (01/05 0600) FiO2 (%):  [0.4 %-41.7 %] 40.9 % (01/05 0600) Weight:  [154 lb 12.2  oz (70.2 kg)] 154 lb 12.2 oz (70.2 kg) (01/05 0500) Weight change: -10 lb 12.8 oz (-4.9 kg)  HEMODYNAMICS: CVP:  [3 mmHg-18 mmHg] 18 mmHg VENTILATOR SETTINGS: Vent Mode:  [-] PRVC FiO2 (%):  [0.4 %-41.7 %] 40.9 % Set Rate:  [14 bmp] 14 bmp Vt Set:  [600 mL] 600 mL PEEP:  [5 cmH20] 5 cmH20 Pressure Support:  [8 cmH20] 8 cmH20 Plateau Pressure:  [18 cmH20-20 cmH20] 20 cmH20 INTAKE / OUTPUT: Intake/Output      01/04 0701 - 01/05 0700   I.V. (mL/kg) 420 (6)   NG/GT 1055   IV  Piggyback 405   Total Intake(mL/kg) 1880 (26.8)   Urine (mL/kg/hr) 165 (0.1)   Other 4979   Stool 375   Total Output 5519   Net -3639         Physical Examination:  General: awake  Neuro: Follows commands but very slow, able to open and close eyes, move toes  HEENT: ETT in place  Cardiovascular: s1s2 with 2/6 SM  Lungs: improved air movement, decreased breath sounds at the basis, no wheezing  Abdomen: Soft, non-tender  Musculoskeletal: Left arm mildly swollen  Skin: Multiple bruises scattered throughout   LABS: Cbc  Lab 03/27/12 0423 03/26/12 0440 03/25/12 0425  WBC 8.6 -- --  HGB 11.1* 11.2* 11.2*  HCT 37.1* 36.6* 36.4*  PLT 102* 117* 150    Chemistry   Lab 03/27/12 0423 03/26/12 1600 03/26/12 0440 03/25/12 0425  NA 135 136 140 --  K 3.6 3.6 3.8 --  CL 100 100 104 --  CO2 27 27 26  --  BUN 23 27* 31* --  CREATININE 1.07 1.32 1.50* --  CALCIUM 8.2* 8.1* 8.0* --  MG 2.3 -- 2.4 2.4  PHOS 2.2* 3.0 2.3 --  GLUCOSE 109* 121* 114* --    Liver fxn  Lab 03/27/12 0423 03/26/12 1600 03/26/12 1130 03/24/12 0410 03/23/12 1553  AST -- -- 22 18 20   ALT -- -- 9 6 7   ALKPHOS -- -- 112 105 107  BILITOT -- -- 0.4 0.4 0.4  PROT -- -- 6.6 5.9* 6.0  ALBUMIN 2.0* 2.0* 2.0* -- --   coags  Lab 03/26/12 0440 03/21/12 0500 03/20/12 1220  APTT -- -- 52*  INR 1.54* 1.59* 1.65*   Sepsis markers  Lab 03/20/12 1230 03/20/12 1220  LATICACIDVEN 2.6* --  PROCALCITON -- 0.31   Cardiac markers  Lab 03/20/12 1220  CKTOTAL --  CKMB --  TROPONINI 0.30*    ABG  Lab 03/22/12 0523 03/21/12 0544 03/20/12 1422  PHART 7.415 7.366 7.432  PCO2ART 40.4 38.6 35.1  PO2ART 154.0* 130.0* 377.0*  HCO3 25.9* 22.2 23.5  TCO2 27 23 25     CBG trend  Lab 03/27/12 0441 03/27/12 0006 03/26/12 2020 03/26/12 1536 03/26/12 1144  GLUCAP 104* 107* 101* 111* 110*       ASSESSMENT / PLAN:  Assessment and Plan:  PULMONARY  ASSESSMENT:  Acute respiratory failure after PEA arrest.  Hx  of COPD.  PLAN:  Pressure support wean as tolerated, await direction on reintubation status in future  Neg balance has benefit him, pcxr still with infiltrates F/u CXR, follow with neg balance Wean cpap 5 ps 5, goal 2 hrs, we may have goal to extubate, but would favor dni prior Await ethics eval, however, if delays are significant for competency of wife , for pt comfort, may need trach  CARDIOVASCULAR  ASSESSMENT:  Shock 2nd to cardiac arrest, sepsis, resolved  Acute  on chronic systolic CHF  Hx of A fib/flutter, CAD, HTN, Hyperlipidemia.  PLAN:  Keep negative fluid balance, tolerated well last 24 hrs  Continue Simvastatin and metoprolol  Anticoagulation in future , pending meeting   RENAL  ASSESSMENT:  Renal failure now on HD ( underwent CVVH yesterday)  Hypokalemia  PLAN:  HD per renal  Monitor electrolytes and replete  Continue neg balance  GASTROINTESTINAL  ASSESSMENT:  Hepatic failure in setting of ETOH and cardiac arrest - improving  Nutrition. Albumin 1.9  Diarrhea 2nd to C diff - improving , afebrile    PLAN:  1. Continue Flagyl / oral vanc  2. 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 Is more alert with family 1-4 responds to voice.  EEG 1/1: polyspike activity emanating from the central region consistent with a diagnosis of myoclonic status epilepticus.  EEG 1/2 : No change from 1/1 PLAN:  Myoclonus / seizures  resolved Keppra, depakote per neuro  Pall care meeting on going Ethics to eval as well, and competency of wife  Almyra Deforest, MD  03/27/2012, 6:29 AM  I have personally obtained a history, examined the patient, evaluated laboratory and imaging results, formulated the assessment and plan and placed orders. CRITICAL CARE: The patient is critically ill with multiple organ systems failure and requires high complexity decision making for assessment and support, frequent evaluation and titration of therapies, application of advanced monitoring technologies and extensive interpretation of multiple databases. Critical Care Time devoted to patient care services described in this note is 30 minutes.   Mcarthur Rossetti. Tyson Alias, MD, FACP Pgr: [phone] [facility]  Pulmonary and Critical Care Medicine [facility] Pager: [phone]  03/27/2012, 6:29 AM
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
## 2012-03-27 NOTE — Progress Notes (Signed)
Pt's BP 76/54, on CRRT keeping -160 ml/hr. Spoke with Dr. Arlean Hopping, order received to keep pt even.

## 2012-03-27 NOTE — Progress Notes (Signed)
TMP Pressures suddenly elevated from 140 to 190 at 10 am. Returned blood and changed the CVVH Filter set. Pt tolerated the procedure well. Dr. Caryn Section updated on fluid balance and patient condition at bedside. Continue to monitor.

## 2012-03-27 NOTE — Progress Notes (Signed)
Pt's wife visiting, pt very alert. RN in room as wife is telling patient that he needs to follow our commands and start breathing on his own so he can get better. She names multiple family members who she says want to remove the machines and let him die. RN told wife that it is inappropriate for her to say things like that about his family members to him. She apologized, stating that she just loves him so much she wants him to live.

## 2012-03-28 LAB — GLUCOSE, CAPILLARY
Glucose-Capillary: 114 mg/dL — ABNORMAL HIGH (ref 70–99)
Glucose-Capillary: 138 mg/dL — ABNORMAL HIGH (ref 70–99)
Glucose-Capillary: 94 mg/dL (ref 70–99)

## 2012-03-28 LAB — RENAL FUNCTION PANEL
CO2: 28 mEq/L (ref 19–32)
GFR calc Af Amer: 89 mL/min — ABNORMAL LOW (ref >90)
GFR calc non Af Amer: 76 mL/min — ABNORMAL LOW (ref >90)
Glucose, Bld: 109 mg/dL — ABNORMAL HIGH (ref 70–99)
Phosphorus: 2.1 mg/dL — ABNORMAL LOW (ref 2.3–4.6)
Potassium: 3.8 mEq/L (ref 3.5–5.1)
Sodium: 139 mEq/L (ref 135–145)

## 2012-03-28 LAB — CBC
HCT: 38.7 % — ABNORMAL LOW (ref 39.0–52.0)
Hemoglobin: 11.5 g/dL — ABNORMAL LOW (ref 13.0–17.0)
WBC: 11.1 10*3/uL — ABNORMAL HIGH (ref 4.0–10.5)

## 2012-03-28 LAB — MAGNESIUM: Magnesium: 2.4 mg/dL (ref 1.5–2.5)

## 2012-03-28 MED ORDER — MIDAZOLAM HCL 2 MG/2ML IJ SOLN: 2.0000 mg | INTRAMUSCULAR | Status: DC

## 2012-03-28 MED ORDER — POTASSIUM PHOSPHATE DIBASIC 3 MMOLE/ML IV SOLN
10.0000 meq | Freq: Once | INTRAVENOUS | Status: AC
  Administered 2012-03-28: 10 meq via INTRAVENOUS
  Filled 2012-03-28: qty 2.27

## 2012-03-28 MED ORDER — FENTANYL CITRATE 0.05 MG/ML IJ SOLN: 100.0000 ug | INTRAMUSCULAR | Status: DC

## 2012-03-28 MED ORDER — NEPRO/CARBSTEADY PO LIQD
1000.0000 mL | ORAL | Status: DC
  Filled 2012-03-28 (×2): qty 1000

## 2012-03-28 NOTE — Progress Notes (Signed)
10 Days Post-Op Procedure(s) (LRB): REMOVAL OF A DIALYSIS CATHETER (Right) INSERTION OF DIALYSIS CATHETER (Right) ARTERIOVENOUS (AV) FISTULA CREATION (Left) Subjective: S/p drainage pericardial and R pleural effusions from CHF,CRF,cirrhosis Incisions well healed, chest tube sutures removed 2D echo and CXR show no sig recurrence postop- cytology and cultures negative   Objective: Vital signs in last 24 hours: Temp:  [96.7 F (35.9 C)-98.3 F (36.8 C)] 98 F (36.7 C) (01/06 1300) Pulse Rate:  [79-87] 81  (01/06 1300) Cardiac Rhythm:  [-] Ventricular paced (01/06 1200) Resp:  [1-26] 9  (01/06 1300) BP: (76-117)/(54-74) 103/61 mmHg (01/06 1300) SpO2:  [97 %-100 %] 100 % (01/06 1300) FiO2 (%):  [30.4 %-41 %] 40.7 % (01/06 1300) Weight:  [147 lb 11.3 oz (67 kg)] 147 lb 11.3 oz (67 kg) (01/06 0400)  Hemodynamic parameters for last 24 hours: CVP:  [2 mmHg-9 mmHg] 4 mmHg  Intake/Output from previous day: 01/05 0701 - 01/06 0700 In: 1860 [I.V.:415; NG/GT:1110; IV Piggyback:335] Out: 4588 [Urine:116; Stool:350] Intake/Output this shift: Total I/O In: 615 [I.V.:115; NG/GT:330; IV Piggyback:170] Out: 395 [Other:295; Stool:100]  Afebrile Incisions healing  Lab Results:  [facility] 03/28/12 0426 03/27/12 0423  WBC 11.1* 8.6  HGB 11.5* 11.1*  HCT 38.7* 37.1*  PLT 98* 102*   BMET:  Basename 03/28/12 0426 03/27/12 1600  NA 139 137  K 3.8 3.8  CL 102 101  CO2 28 27  GLUCOSE 109* 134*  BUN 22 24*  CREATININE 1.00 1.03  CALCIUM 8.3* 8.2*    PT/INR:  Basename 03/26/12 0440  LABPROT 18.0*  INR 1.54*   ABG    Component Value Date/Time   PHART 7.415 03/22/2012 0523   HCO3 25.9* 03/22/2012 0523   TCO2 27 03/22/2012 0523   ACIDBASEDEF 3.0* 03/21/2012 0544   O2SAT 99.0 03/22/2012 0523   CBG (last 3)   Basename 03/28/12 1157 03/28/12 0808 03/28/12 0416  GLUCAP 138* 114* 101*    Assessment/Plan: S/P Procedure(s) (LRB): REMOVAL OF A DIALYSIS CATHETER (Right) INSERTION  OF DIALYSIS CATHETER (Right) ARTERIOVENOUS (AV) FISTULA CREATION (Left) Call as needed   LOS: 32 days    VAN TRIGT III,Jearldine Cassady 03/28/2012

## 2012-03-28 NOTE — Progress Notes (Signed)
I have had extensive discussions with family wife, son. We discussed patients current circumstances and organ failures. We also discussed patient's prior wishes under circumstances such as this. Family has decided to NOT perform resuscitation if arrest but to continue current medical support for now. Will extubate 10 am tues, DNI then , continue medical support  Unless distress then comfort. Dr Phillips Odor , Maylon Peppers present  Mcarthur Rossetti. Tyson Alias, MD, FACP Pgr: [phone] [facility] Ccm time additional 55 min

## 2012-03-28 NOTE — Progress Notes (Signed)
NUTRITION FOLLOW UP  Intervention:    Decrease Nepro to 30 ml/h with Prostat 30 ml BID to provide 1496 kcals, 88 gm protein, 523 ml free water daily to better meet re-estimated nutrition needs.  Nutrition Dx:   Inadequate oral intake r/t inability to eat AEB NPO status, ongoing.  Goal:   Pt to meet >/= 90% of their estimated nutrition needs, met.  Monitor:   TF tolerance, weight trend, labs, I/O's, vent status.  Assessment:   Patient remains intubated on ventilator support.  CVVHD was stopped this morning; now receiving intermittent HD. Ethics Team has been consulted to help determine code status before considering extubation. MV: 8.9 Temp:Temp (24hrs), Avg:97.6 F (36.4 C), Min:96.7 F (35.9 C), Max:98.3 F (36.8 C)   C. Diff positive, receiving flagyl and PO vanc.  Height: Ht Readings from Last 1 Encounters:  03/17/12 5\' 5"  (1.651 m)    Weight Status:   Wt Readings from Last 1 Encounters:  03/28/12 147 lb 11.3 oz (67 kg)  BMI=24.6 12/30 80.6 kg 12/23 81.1 kg Weight fluctuating with fluids and renal failure Unknown dry weight  Re-estimated needs:  Kcal: 1550 Protein: 85 - 100 grams  Fluid: 1.2 liters daily  Skin: stage 2 sacral wound  Diet Order:   NPO; Nepro at 35 ml/h with Prostat 30 ml BID providing 1712 kcals, 98 gm protein, 611 ml free water daily.    Intake/Output Summary (Last 24 hours) at 03/28/12 1151 Last data filed at 03/28/12 1000  Gross per 24 hour  Intake   1800 ml  Output   4033 ml  Net  -2233 ml    Last BM: 1/6   Labs:   Lab 03/28/12 0426 03/27/12 1600 03/27/12 0423 03/26/12 1600 03/26/12 0440  NA 139 137 135 -- --  K 3.8 3.8 3.6 -- --  CL 102 101 100 -- --  CO2 28 27 27  -- --  BUN 22 24* 23 -- --  CREATININE 1.00 1.03 1.07 -- --  CALCIUM 8.3* 8.2* 8.2* -- --  MG 2.4 -- 2.3 -- 2.4  PHOS 2.1* -- 2.2* 3.0 --  GLUCOSE 109* 134* 109* -- --    CBG (last 3)   Basename 03/28/12 0808 03/28/12 0416 03/28/12 0020  GLUCAP 114*  101* 114*    Scheduled Meds:    . antiseptic oral rinse  15 mL Mouth Rinse QID  . chlorhexidine  15 mL Mouth Rinse BID  . darbepoetin (ARANESP) injection - NON-DIALYSIS  100 mcg Subcutaneous Q Wed-1800  . famotidine  20 mg Oral Daily  . feeding supplement (NEPRO CARB STEADY)  1,000 mL Per Tube Q24H  . feeding supplement  30 mL Per Tube BID  . insulin aspart  0-9 Units Subcutaneous Q4H  . levetiracetam  500 mg Intravenous Q12H  . metoprolol tartrate  25 mg Per Tube BID  . metroNIDAZOLE  500 mg Per Tube Q8H  . multivitamin  1 tablet Oral QHS  . simvastatin  40 mg Oral QHS  . Tamsulosin HCl  0.4 mg Oral QPC supper  . valproate sodium  1,500 mg Intravenous Q8H  . vancomycin  125 mg Oral QID    Continuous Infusions:    . sodium chloride 20 mL/hr at 03/26/12 0400    Joaquin Courts, RD, LDN, CNSC Pager# [phone] After Hours Pager# [phone]

## 2012-03-28 NOTE — Progress Notes (Signed)
Name: [name] MRN: [id number] DOB: 1946-01-12 LOS: 32  PCCM RESIDENT DAILY PROGRESS NOTE  History of Present Illness:  67 year old with history of alcohol abuse who presented to the hospital on December 6th with SOB, was found to have acute on chronic respiratory failure with EF of 40-45 percent who had an extremely complicated hospital course with pericardial effusion requiring a window, pleural effusion requiring a chest tube, renal failure requiring dialysis and liver failure from etoh. On 12/29 patient presented to the ICU after a 12 minute PEA cardiac arrest that was likely related to respiratory failure ?narcotic use for pain. Upon presenting to the ICU the patient was hypotensive and obtunded post code.   Lines/Draines  ETT: 12/29>>  R Diatek catheter>>  L IJ TLC 12/29>>>  R radial a-line 12/29>>12/31   Cultures:  12/30 C.diff  12/30 Ucx > 80.000 yeast  12/29 Bcx>>uptodate no growth  12/29 Res cx: RARE WBC PRESENT, PREDOMINANTLY PMNNO SQUAMOUS EPITHELIAL CELLS SEEN FEW YEAST WITH PSEUDOHYPHAE RARE GRAM POSITIVE RODS  12/20 Right pleural fluid: RARE WBC PRESENT, PREDOMINANTLY MONONUCLEAR NO ORGANISMS SEEN  12/20 Pericardium culture; NO WBC SEEN NO ORGANISMS SEEN  12/20 Urine culture : negative  12/10 Pleural effusion: negative  12/6 Ucx: E.coli - Pansensitive   Antibiotics:  Flagyl (12/31) >>  Oral Vancomycin (12/30)> >  Zosyn 12/29>>>12/31   Tests / Events:  1. EEG 03/23/12 ; This is an abnormal EEG secondary to a polyspike activity emanating from the central region. 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  2. Palliative Care consult 03/23/12: Full code , Maintain full scope medical treatment  3. Repeat EEG 1/2: No change from 1/1  4. Palliative Care consult 03/26/12: Full code , Maintain full scope medical treatment . Except wife everyone else who was present during meeting ( son, pastor ) feels that patient does not want current  quality of life. It seems wife has not capacity to make decisions. Need for Ethic Consult .    Overnight Events:  BP dropped down to 76/54 . Urine output 116 cc/24 h  Vital Signs: Temp:  [97.3 F (36.3 C)-98.3 F (36.8 C)] 97.4 F (36.3 C) (01/06 0700) Pulse Rate:  [79-88] 87  (01/06 0700) Resp:  [1-20] 19  (01/06 0700) BP: (76-117)/(54-76) 106/67 mmHg (01/06 0700) SpO2:  [97 %-100 %] 100 % (01/06 0700) FiO2 (%):  [30.4 %-41 %] 30.5 % (01/06 0700) Weight:  [147 lb 11.3 oz (67 kg)] 147 lb 11.3 oz (67 kg) (01/06 0400) I/O last 3 completed shifts: In: 2945 [I.V.:655; NG/GT:1720; IV Piggyback:570] Out: 7571 [Urine:195; WUJWJ:1914; Stool:525]  Physical Examination:  General: awake  Neuro: sleepy today for me. But follows commands  HEENT: ETT in place  Cardiovascular: s1s2 with 2/6 SM  Lungs: improved air movement, decreased breath sounds at the basis, no wheezing  Abdomen: Soft, non-tender  Musculoskeletal: Left arm mildly swollen  Skin: Multiple bruises scattered throughout  Ventilator settings: Vent Mode:  [-] PRVC FiO2 (%):  [30.4 %-41 %] 30.5 % Set Rate:  [14 bmp] 14 bmp Vt Set:  [600 mL] 600 mL PEEP:  [5 cmH20] 5 cmH20 Pressure Support:  [5 cmH20] 5 cmH20 Plateau Pressure:  [18 cmH20-23 cmH20] 23 cmH20 No results found for this basename: PHART:3,PCO2:3,PO2ART:3,TCO2:3,HCO3:3 in the last 72 hours Labs and Imaging:   Basic Metabolic Panel:  Lab 03/28/12 7829 03/27/12 1600 03/27/12 0423  NA 139 137 --  K 3.8 3.8 --  CL 102 101 --  CO2 28 27 --  GLUCOSE 109* 134* --  BUN 22 24* --  CREATININE 1.00 1.03 --  CALCIUM 8.3* 8.2* --  MG 2.4 -- 2.3  PHOS 2.1* -- 2.2*   Liver Function Tests:  Lab 03/28/12 0426 03/27/12 0423 03/26/12 1130 03/24/12 0410  AST -- -- 22 18  ALT -- -- 9 6  ALKPHOS -- -- 112 105  BILITOT -- -- 0.4 0.4  PROT -- -- 6.6 5.9*  ALBUMIN 2.1* 2.0* -- --    CBC:  Lab 03/28/12 0426 03/27/12 0423  WBC 11.1* 8.6  NEUTROABS -- --  HGB 11.5*  11.1*  HCT 38.7* 37.1*  MCV 82.5 80.7  PLT 98* 102*    CBG:  Lab 03/28/12 0416 03/28/12 0020 03/27/12 2022 03/27/12 0800 03/27/12 0441 03/27/12 0006  GLUCAP 101* 114* 122* 111* 104* 107*    Coagulation:  Lab 03/26/12 0440  LABPROT 18.0*  INR 1.54*   Anemia Panel:  Lab 03/21/12 1430  VITAMINB12 --  FOLATE --  FERRITIN 276  TIBC 154*  IRON 13*  RETICCTPCT --      Assessment and Plan:   PULMONARY   ASSESSMENT:  Acute respiratory failure after PEA arrest.  Hx of COPD.  PLAN:  Pressure support wean as tolerated, await direction on reintubation status in future  Neg balance has benefit him Wean cpap 5 ps 5, goal 2 hrs, we may have goal to extubate, but would favor dni prior, will call wife today If wishes are aggressive care, given debilitation, likely recurrent effusions, continued renal failure and neurostatus does not support extubation , airway protection, would favor trach outright Risk without trach would be arrest again pcxr with residual infiltrates as well Neg balance goals remain Await ethics eval  CARDIOVASCULAR  ASSESSMENT:  Shock 2nd to cardiac arrest, sepsis, resolved  Acute on chronic systolic CHF  Hx of A fib/flutter, CAD, HTN, Hyperlipidemia.  PLAN:  Keep negative fluid balance Continue Simvastatin and metoprolol  Anticoagulation in future, maybe, high fall risk  RENAL  ASSESSMENT:  Renal failure now on HD ( underwent CVVH yesterday)  Hypokalemia  PLAN:  HD per renal  Monitor electrolytes and replete  Continue neg balance   GASTROINTESTINAL  ASSESSMENT:  Hepatic failure in setting of ETOH and cardiac arrest - improving  Nutrition. Albumin 1.9  Diarrhea 2nd to C diff - improving , afebrile  PLAN:  1. Continue Flagyl / oral vanc  2. 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 Is more alert with family 1-4 responds to voice.  EEG 1/1: polyspike activity emanating from the central region consistent with a diagnosis of myoclonic status epilepticus.  EEG 1/2 : No change from 1/1  PLAN:  Myoclonus / seizures resolved  Keppra, depakote per neuro  Klonopin prn  Pall care meeting on going  Ethics to eval as well, and competency of wife   I will meet with wife  myself today and discuss trach and update pall  Best practices / Disposition: -->ICU status under PCCM -->full code -->SCD for DVT Px -->Pepcid  for GI Px -->ventilator bundle -->diet - TF Consults : Neuro,  Nephro , Palliative   Almyra Deforest, MD  03/27/2012, 6:29 AM   I have personally obtained a history, examined the patient, evaluated laboratory and imaging results, formulated the assessment and plan and placed orders.  CRITICAL CARE:  The patient is critically ill with multiple organ systems failure and requires high complexity decision making for assessment and support, frequent evaluation and titration of therapies, application of advanced monitoring technologies and extensive interpretation of multiple databases. Critical Care Time devoted to patient care services described in this note is 30 minutes.   Mcarthur Rossetti. Tyson Alias, MD, FACP  Pgr: [phone]  [facility]  ILLATH,[name] 03/28/2012, 7:50 AM

## 2012-03-28 NOTE — Progress Notes (Signed)
[facility]  Subjective:  Patient resting this morning. Awakens to voice and able to follow simple commands.  CVVH changed to keep patient even at approximately 11pm last night due to hypotension to 76/54.  No pressors on board.    Objective: Vital signs in last 24 hours: Blood pressure 106/67, pulse 87, temperature 97.4 F (36.3 C), temperature source Other (Comment), resp. rate 19, height 5\' 5"  (1.651 m), weight 147 lb 11.3 oz (67 kg), SpO2 100.00%.    PHYSICAL EXAM General--as above Chest--clear to auscultation anteriorly, pacer L subclavian. R HD tunnel cath.  Heart--no rub, regular rate and rhytm Abd--nontender Extr--UE edema L arm > R. Minimal LE edema, AVF patent L forearm     Date   Weight  17 Dec  72 kg  20 Dec  73.9  23 Dec  77.9  26 Dec  74.5  30 Dec  80.6  31 Dec   78.3  1 Jan    81  2 Jan    81.5  3 Jan    79.5  4 jan  75.1 kg 5 Jan  70.2 kg 6 Jan  67 kg    Lab Results:   Lab 03/28/12 0426 03/27/12 1600 03/27/12 0423 03/26/12 1600  NA 139 137 135 --  K 3.8 3.8 3.6 --  CL 102 101 100 --  CO2 28 27 27  --  BUN 22 24* 23 --  CREATININE 1.00 1.03 1.07 --  ALB -- -- -- --  GLUCOSE 109* -- -- --  CALCIUM 8.3* 8.2* 8.2* --  PHOS 2.1* -- 2.2* 3.0     Basename 03/28/12 0426 03/27/12 0423  WBC 11.1* 8.6  HGB 11.5* 11.1*  HCT 38.7* 37.1*  PLT 98* 102*     I have reviewed the patient's current medications. Scheduled:    . antiseptic oral rinse  15 mL Mouth Rinse QID  . chlorhexidine  15 mL Mouth Rinse BID  . darbepoetin (ARANESP) injection - NON-DIALYSIS  100 mcg Subcutaneous Q Wed-1800  . famotidine  20 mg Oral Daily  . feeding supplement (NEPRO CARB STEADY)  1,000 mL Per Tube Q24H  . feeding supplement  30 mL Per Tube BID  . insulin aspart  0-9 Units Subcutaneous Q4H  . levetiracetam  500 mg Intravenous Q12H  . metoprolol tartrate  25 mg Per Tube BID  . metroNIDAZOLE  500 mg Per Tube Q8H  . multivitamin  1 tablet Oral QHS    . simvastatin  40 mg Oral QHS  . Tamsulosin HCl  0.4 mg Oral QPC supper  . valproate sodium  1,500 mg Intravenous Q8H  . vancomycin  125 mg Oral QID   Continuous:    . sodium chloride 20 mL/hr at 03/26/12 0400  . dialysis replacement fluid (prismasate) 500 mL/hr at 03/28/12 0043  . dialysis replacement fluid (prismasate) 500 mL/hr at 03/28/12 0043  . dialysate (PRISMASATE) 1,000 mL/hr at 03/28/12 0550    Assessment/Plan: 1. AKI superimposed on CKD-- (Cr 1.68 in Aug--2.8 on 12 Dec. HD started 30 Dec, currently on CVVH). AVF placed in L forearm 27 Dec. CXR with B pl eff and bibasilar interstitial edema persistent 1/5 but slightly decreased. Still oliguric (116 cc) *will take patient off CVVH today. Plan to follow labs and start intermittent HD as necessary.  2. Anemia-- On Aranesp 100/wk. Hgb 11.5. Fe/TIBC 9 % sat with ferritin 131 on 17 Dec.--on IV Fe (12/31 + 1/4).  3. Secondary PTH-- PTH 364 on 17 Dec-- on  hectorol 1 mcg each HD. Phosphorus 2.1, Ca 8.3 earlier today.  *consider K-phos: will discuss with primary team  Other medical issues per primary team: 1. Ischemic cardiomyopathy/CHF--did not respond to diuretics. Dialysis started 30 Dec. CXR showed B pl eff and patchy B lower lobe infiltrates. Same plan as above  2. Pericardial effusion--S/P window Mar 23, 2012--no new suggestions  3. Afib, s/p nodal ablation + pacer--no new suggestions  4. S/P PEA arrest 29 Dec--neurologic status continues to improve per exam and per history. Markedly abnormal EEG persists. Patient is to be continued on Depakote and Keppra per neurology. Long term planning per palliative and ethics consult regarding code status.  5. + C Diff--on flagyl and PO vanc  6.  VDRF--primary team attempting to decide on reintubation status before considering extubation.  7. Thrombocytopenia-Heparin has been stopped. SCDs only.      LOS: 32 days   [name]. Jon Sleigh, MD, PGY2 03/28/2012 7:50 AM   I have seen  and examined this patient and agree with the plan of care.  [name] 03/28/2012, 8:07 PM

## 2012-03-28 NOTE — Progress Notes (Signed)
Palliative Medicine Team SW Received phone call from pt's wife Bonita Quin, frantic regarding mtg this afternoon with PCCM at 2pm. Wife very anxious, reporting her perception that mtg would be "to take him off the machines". Per RN, mtg is to discuss options for trach/peg. Able to get wife to calm down, though she continues to have difficulty with simple instructions/abstract concepts. Wife continues to have very poor health literacy, reports perception that pt will not need ventilator support because "he can blink his eyes at me, we have a system". Explained that pt's medical condition is complex and that his ability to blink is separate and yet dependent upon his ability to breath independently. Provided emotional support and empathic listening to the difficulty that wife has been having and her perception that "no one is on my side". Encouraged wife to consider that there are no "sides" and to keep pt's best interest in mind. Wife reports her understanding, thankful for support. PMT Dr. Phillips Odor made aware. Available as needed.   [name], LCSWA PMT Phone [phone]

## 2012-03-28 NOTE — Progress Notes (Addendum)
NEURO HOSPITALIST PROGRESS NOTE   SUBJECTIVE:                                                                                                                        Patient intubated, on no sedation, tracts my movements in room, looks toward voice and will lift bilateral arms to command. Currently on dialysis.  OBJECTIVE:                                                                                                                           Vital signs in last 24 hours: Temp:  [97.3 F (36.3 C)-98.3 F (36.8 C)] 97.4 F (36.3 C) (01/06 0700) Pulse Rate:  [79-88] 86  (01/06 0755) Resp:  [1-20] 16  (01/06 0755) BP: (76-117)/(54-76) 106/67 mmHg (01/06 0755) SpO2:  [97 %-100 %] 100 % (01/06 0755) FiO2 (%):  [30.4 %-41 %] 40.6 % (01/06 0755) Weight:  [67 kg (147 lb 11.3 oz)] 67 kg (147 lb 11.3 oz) (01/06 0400)  Intake/Output from previous day: 01/05 0701 - 01/06 0700 In: 1860 [I.V.:415; NG/GT:1110; IV Piggyback:335] Out: 1478 [Urine:116; Stool:350] Intake/Output this shift: Total I/O In: 285 [I.V.:35; NG/GT:145; IV Piggyback:105] Out: 218 [Other:118; Stool:100] Nutritional status:    Past Medical History  Diagnosis Date  . ALCOHOL ABUSE, HX OF 04/30/2008  . 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 Atrial flutter 09/20/2006  . 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 Chronic systolic heart failure 09/20/2006    previous EF 25%;  Echocardiogram 12/10/10: Mild-moderate LVH, EF 50-55%, mild MR, moderate LAE, moderate RAE, mild RVE with moderately reduced RV SF, PASP 50, small pericardial effusion  . COPD 12/17/2006  . CORONARY ARTERY DISEASE 09/20/2006    s/p stent to OM;  catheterization 12/06: Mid to distal LAD 30%, D2 20%, proximal circumflex 30%, OM 2 stents patent, ostial RCA 50%, mid RCA 40%, distal RCA multiple 40%  . 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 DYSPEPSIA&OTHER [facility] DISORDERS FUNCTION STOMACH 04/04/2009  . HYPERTENSION 12/17/2006  . OBESITY 04/30/2008  . HLD (hyperlipidemia) 09/20/2006  . OTITIS EXTERNA, CHRONIC NEC 12/28/2006  . PERSONAL HX COLONIC POLYPS 05/02/2009  .  PSORIASIS 04/30/2008  . CKD (chronic kidney disease) 04/30/2008    creatinine 2.2 range      Neurologic Exam:  Mental Status: Alert, tracts my movements in room, follows minimal verbal commands.  He did lift both arms off bed to command and closed his eyes to command. .  Intubated.  Cranial Nerves: II:  Blinks to threat bilaterally and tracts my movements in room.  III,IV, VI: ptosis not present, extra-ocular motions intact bilaterally, pupils equal, round, reactive to light, left pupil shows slower response to light than right.  V,VII: smile symmetric, facial light touch sensation normal bilaterally --winces to pain bilaterally VIII: hearing normal bilaterally--looks to side voice is called to IX,X: gag reflex present XI: bilateral shoulder shrug  Motor: sqeezes both hands weakly, will raise both arms off bed to verbal command.  He will resist PROM with 4/5 strength.  He did not withdrawal from painful stimuli. No myoclonic activity noted.  Sensory: mild grimaces to pain in all 4 extremities but does not withdrawl Deep Tendon Reflexes: 2+ and symmetric throughout Plantars: Right: downgoing   Left: upgoing     Lab Results: Lab Results  Component Value Date/Time   CHOL 120 02/08/2011  5:45 AM   Lipid Panel No results found for this basename: CHOL,TRIG,HDL,CHOLHDL,VLDL,LDLCALC in the last 72 hours  Studies/Results: Dg Chest Port 1 View  03/27/2012  *RADIOLOGY REPORT*  Clinical Data: Intubated  PORTABLE CHEST - 1 VIEW  Comparison: 03/26/2012  Findings: Endotracheal and nasogastric tubes appropriately positioned.  Right IJ approach dual lumen catheter tips terminate over the cavoatrial junction and right atrium.  Left-sided pacer in place.  Left IJ  approach central line tip not well visualized due to obscuration by overlying catheters but appears to terminate over the mid SVC.  Moderate enlargement cardiomediastinal silhouette noted with stable bilateral small pleural effusions obscuring the bases.  IMPRESSION: Stable cardiomegaly with small pleural effusions.   Original Report Authenticated By: Christiana Pellant, M.D.     MEDICATIONS                                                                                                                        Scheduled:   . antiseptic oral rinse  15 mL Mouth Rinse QID  . chlorhexidine  15 mL Mouth Rinse BID  . darbepoetin (ARANESP) injection - NON-DIALYSIS  100 mcg Subcutaneous Q Wed-1800  . famotidine  20 mg Oral Daily  . feeding supplement (NEPRO CARB STEADY)  1,000 mL Per Tube Q24H  . feeding supplement  30 mL Per Tube BID  . insulin aspart  0-9 Units Subcutaneous Q4H  . levetiracetam  500 mg Intravenous Q12H  . metoprolol tartrate  25 mg Per Tube BID  . metroNIDAZOLE  500 mg Per Tube Q8H  . multivitamin  1 tablet Oral QHS  . simvastatin  40 mg Oral QHS  . Tamsulosin HCl  0.4 mg Oral QPC supper  . valproate sodium  1,500 mg Intravenous Q8H  . vancomycin  125 mg Oral QID    ASSESSMENT/PLAN:                                                                                                             Patient Active Hospital Problem List:  Altered mental status (03/20/2012) Assessment: No significant change from previous note.  Patient continues to follow minimal verbal commands and grimaces to pain Plan: Will continue to follow clinically  Myoclonus (03/23/2012) Assessment: Last Depakote level 03/27/11 61.2. He remains on Depakote 1,500 mg q8 hours.  He shows no myoclonic activity on exam.  Plan: 1. Continue Depakote 1500mg  q8hrs and Keppra 500 mg q 12 hours  Ethics evaluation pending.    Felicie Morn PA-C [facility] [phone]  03/28/2012, 9:53 AM

## 2012-03-29 LAB — MAGNESIUM: Magnesium: 2.5 mg/dL (ref 1.5–2.5)

## 2012-03-29 LAB — RENAL FUNCTION PANEL
CO2: 25 mEq/L (ref 19–32)
Calcium: 8.4 mg/dL (ref 8.4–10.5)
Chloride: 99 mEq/L (ref 96–112)
GFR calc Af Amer: 46 mL/min — ABNORMAL LOW (ref >90)
GFR calc non Af Amer: 39 mL/min — ABNORMAL LOW (ref >90)
Potassium: 3.6 mEq/L (ref 3.5–5.1)
Sodium: 135 mEq/L (ref 135–145)

## 2012-03-29 LAB — CBC
Hemoglobin: 10.7 g/dL — ABNORMAL LOW (ref 13.0–17.0)
RBC: 4.25 MIL/uL (ref 4.22–5.81)

## 2012-03-29 LAB — GLUCOSE, CAPILLARY: Glucose-Capillary: 103 mg/dL — ABNORMAL HIGH (ref 70–99)

## 2012-03-29 MED ORDER — MORPHINE SULFATE 2 MG/ML IJ SOLN: 1.0000 mg | INTRAMUSCULAR | Status: DC | PRN

## 2012-03-29 MED ORDER — MORPHINE SULFATE 25 MG/ML IV SOLN
5.0000 mg/h | INTRAVENOUS | Status: DC
  Administered 2012-03-29: 5 mg/h via INTRAVENOUS
  Filled 2012-03-29: qty 10

## 2012-03-29 MED ORDER — CLONAZEPAM 0.5 MG PO TABS: 0.5000 mg | ORAL_TABLET | Freq: Three times a day (TID) | ORAL | Status: DC | PRN

## 2012-03-29 MED ORDER — SODIUM CHLORIDE 0.9 % IV SOLN
250.0000 mg | Freq: Two times a day (BID) | INTRAVENOUS | Status: DC
  Administered 2012-03-29: 250 mg via INTRAVENOUS
  Filled 2012-03-29 (×2): qty 2.5

## 2012-03-29 MED ORDER — MORPHINE SULFATE 2 MG/ML IJ SOLN
2.0000 mg | INTRAMUSCULAR | Status: DC | PRN
  Administered 2012-03-29: 2 mg via INTRAVENOUS
  Filled 2012-03-29: qty 1

## 2012-03-29 NOTE — Progress Notes (Signed)
Palliative Medicine Team SW  Received notice of pt's decline, need for family support. Pt appears comfortable, unresponsive, jaw ajar. Met with several family members at bedside; group largely divided by pt's family of origin/1st marriage vs wife and friends of second marriage. Pt's wife at bedside, requiring continuous reassurance that pt is comfortable and reminders not to demand for pt to breathe or talk to her. Wife very tearful, often having outbursts "There is no God!; God would have healed him, he would have answered my prayers", "I just want him to die!". Able to calm wife as these outbursts are distressing to other family. Affirmed the difficulty of accepting God's will when it it mean losing someone we love. 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 Discussed the importance of praying for acceptance and strength through this process. Family report their pastor was here earlier today, reiterated chaplain availability.   Engaged most family members (excluding wife) in reminisce therapy about his life growing up in [place], pranks he pulled on his younger siblings and his great sense of humor. Encouraged family to provide supportive presence and to talk to pt gently, but also to be mindful of allowing pt to rest and to keep room as calm as possible. Family very appreciative of support. They are doing the best they can to be patient with one another in light of family conflict.   Later talked with son who reports family has not yet been able to discuss final arrangements and his limitations on managing that process. RN made aware, will leave a list of funeral homes, cremation services, etc.   [name], LCSWA PMT Phone [phone]

## 2012-03-29 NOTE — Progress Notes (Signed)
Approximately 3:00PM Patient assessment changed and Oxygen requirements increasing, less responsive to stimulation and Notified MD [name] and [name] to review plan of care as patient is DNR in medical record.  Made contact with Patient son [name] who was in the ICU waiting room and provided him and update about the change in the patient status and plan of care to provide comfort medications at this time as his father was not going to survive without the life support breathing machine.  [name] stated he understood and and agreed with focusing on comfort at this time.  I opened up visitation for this family and informed [name] he and his entire family could come to the bedside.  [name] was not in the waiting area and I made contact with her over the phone and explained that as Dr. Tyson [name] and Dr. Phillips [name] had explained yesterday they did not believe the patient was going to survive this critical illness and that if he did not do well after extubation the plan would be to provide comfort she stated she understood.  I informed her that [name] was not doing well and that I was beginning to administer medications to keep him comfortable and wanted her to have time to be here at the bedside with him.  She thanked me for calling and said she was on the way.

## 2012-03-29 NOTE — Progress Notes (Signed)
Palliative Medicine Team SW Referred for psychosocial/grief support to family post extubation. Met extensively with pt's son [name] in hallway who is coping well despite several stressors and family dynamics. PMT has assisted with completion of FMLA paperwork which is in the shadow chart. Pt appears comfortable, opening his eyes intermittently. Provided emotional support and EOL education to pt's cousin [name] who was at bedside. Empathized with the difficult process this has been for all family members. Family report pt's wife [name] left the room, they are not sure when she will return. Family very appreciative of support, have my contact info as needs arise. Encouraged family to work with RN on setting boundaries for visitation as needed. Feel free to page me as needed for additional support.   [name], [place] Pager [phone] PMT Phone [phone]

## 2012-03-29 NOTE — Progress Notes (Signed)
I have seen and examined this patient and agree with the plan of care  Tyshana Nishida W 03/29/2012, 4:00 PM   

## 2012-03-29 NOTE — Progress Notes (Signed)
[facility]  Subjective:  Patient resting this morning. CVVH stopped yesterday. Continues with intermittent hypotension. No pressors on board.   Objective: Vital signs in last 24 hours: Blood pressure 107/55, pulse 83, temperature 99.5 F (37.5 C), temperature source Core (Comment), resp. rate 20, height 5\' 5"  (1.651 m), weight 149 lb 7.6 oz (67.8 kg), SpO2 99.00%.    PHYSICAL EXAM General--as above Chest--clear to auscultation anteriorly, pacer L subclavian. R HD tunnel cath.  Heart--no rub, regular rate and rhytm Abd--nontender Extr--UE edema L arm > R. Trace LE edema, AVF patent L forearm     Date   Weight  17 Dec  72 kg  20 Dec  73.9  23 Dec  77.9  26 Dec  74.5  30 Dec  80.6  31 Dec   78.3  1 Jan    81  2 Jan    81.5  3 Jan    79.5  4 jan  75.1 kg 5 Jan  70.2 kg 6 Jan  67 kg 7 Jan  67.8kg Lab Results:   Lab 03/29/12 0500 03/28/12 0426 03/27/12 1600 03/27/12 0423  NA 135 139 137 --  K 3.6 3.8 3.8 --  CL 99 102 101 --  CO2 25 28 27  --  BUN 38* 22 24* --  CREATININE 1.73* 1.00 1.03 --  ALB -- -- -- --  GLUCOSE 114* -- -- --  CALCIUM 8.4 8.3* 8.2* --  PHOS 3.1 2.1* -- 2.2*     Basename 03/29/12 0500 03/28/12 0426  WBC 10.8* 11.1*  HGB 10.7* 11.5*  HCT 34.8* 38.7*  PLT 109* 98*     I have reviewed the patient's current medications. Scheduled:    . antiseptic oral rinse  15 mL Mouth Rinse QID  . chlorhexidine  15 mL Mouth Rinse BID  . darbepoetin (ARANESP) injection - NON-DIALYSIS  100 mcg Subcutaneous Q Wed-1800  . famotidine  20 mg Oral Daily  . feeding supplement (NEPRO CARB STEADY)  1,000 mL Per Tube Q24H  . feeding supplement  30 mL Per Tube BID  . insulin aspart  0-9 Units Subcutaneous Q4H  . levetiracetam  250 mg Intravenous Q12H  . metoprolol tartrate  25 mg Per Tube BID  . metroNIDAZOLE  500 mg Per Tube Q8H  . multivitamin  1 tablet Oral QHS  . simvastatin  40 mg Oral QHS  . Tamsulosin HCl  0.4 mg Oral QPC supper  .  valproate sodium  1,500 mg Intravenous Q8H  . vancomycin  125 mg Oral QID   Continuous:    . sodium chloride 20 mL/hr at 03/29/12 1011    Assessment/Plan: 67 year old with multiple medical issues with complicated hospital course including pericardial window for pericardial effusion who was started on HD Dec 30 after PEA arrest on 12/29 and worsening kidney function with CHF refractory to medical management. Patient on CVVH through 1/6. Baseline Cr 1.68 in August but 2.8 at admission. AVF in L forearm 27 Dec  1. AKI superimposed on CKD-- CXR with B pl eff and bibasilar interstitial edema persistent 1/5 with no follow up ordered. till oliguric (150 cc) *will continue to monitor off CVVH today. Plan for extubation today. Given multiple medical problems, transition to DNR/DNI, continued hypotension, patient not a good candidate for HD longterm or for future CVVH. Will follow AM labs.  2. Anemia-- On Aranesp 100/wk. Hgb 10.7. Fe/TIBC 9 % sat with ferritin 131 on 17 Dec.--on IV Fe (12/31 + 1/4).  3. Secondary PTH-- PTH 364 on 17 Dec-- on hectorol 1 mcg each HD. Phosphorus 3.1 after repletion, Ca 8.4 earlier today.   Other medical issues per primary team: 1. Ischemic cardiomyopathy/CHF--did not respond to diuretics. Dialysis started 30 Dec. CXR showed B pl eff and patchy B lower lobe infiltrates. Same plan as above  2. Pericardial effusion--S/P window 11 Mar 2012--no new suggestions  3. Afib, s/p nodal ablation + pacer--no new suggestions  4. S/P PEA arrest 29 Dec--neurologic status stable. Markedly abnormal EEG persists. Patient is to be continued on Depakote and Keppra per neurology. Long term planning per palliative and ethics consult regarding code status.  5. + C Diff--on flagyl and PO vanc  6.  VDRF--primary team attempting to decide on reintubation status before considering extubation.  7. Thrombocytopenia-Heparin has been stopped. SCDs only.      LOS: 33 days   Aldine Contes. Marti Sleigh,  MD, PGY2 03/29/2012 10:40 AM

## 2012-03-29 NOTE — Progress Notes (Signed)
NEURO HOSPITALIST PROGRESS NOTE   SUBJECTIVE:                                                                                                                        More awake today and moving bilateral UE more spontaneously. Made a smile at family member.  Continues to tract my movements in room.   OBJECTIVE:                                                                                                                           Vital signs in last 24 hours: Temp:  [97.8 F (36.6 C)-100.1 F (37.8 C)] 99.5 F (37.5 C) (01/07 1000) Pulse Rate:  [80-89] 83  (01/07 1000) Resp:  [4-24] 20  (01/07 1000) BP: (71-116)/(47-70) 107/55 mmHg (01/07 1000) SpO2:  [94 %-100 %] 99 % (01/07 1000) FiO2 (%):  [30 %-41 %] 40.6 % (01/07 1000) Weight:  [67.8 kg (149 lb 7.6 oz)] 67.8 kg (149 lb 7.6 oz) (01/07 0200)  Intake/Output from previous day: 01/06 0701 - 01/07 0700 In: 2210 [I.V.:395; NG/GT:1200; IV Piggyback:615] Out: 650 [Urine:155; Stool:200] Intake/Output this shift: Total I/O In: 261.2 [I.V.:58.7; NG/GT:100; IV Piggyback:102.5] Out: 0  Nutritional status:    Past Medical History  Diagnosis Date  . ALCOHOL ABUSE, HX OF 04/30/2008  . 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 Atrial flutter 09/20/2006  . 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 Chronic systolic heart failure 09/20/2006    previous EF 25%;  Echocardiogram 12/10/10: Mild-moderate LVH, EF 50-55%, mild MR, moderate LAE, moderate RAE, mild RVE with moderately reduced RV SF, PASP 50, small pericardial effusion  . COPD 12/17/2006  . CORONARY ARTERY DISEASE 09/20/2006    s/p stent to OM;  catheterization 12/06: Mid to distal LAD 30%, D2 20%, proximal circumflex 30%, OM 2 stents patent, ostial RCA 50%, mid RCA 40%, distal RCA multiple 40%  . 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 DYSPEPSIA&OTHER [facility]  DISORDERS FUNCTION STOMACH 04/04/2009  . HYPERTENSION 12/17/2006  . OBESITY 04/30/2008  . HLD (hyperlipidemia) 09/20/2006  . OTITIS EXTERNA, CHRONIC NEC 12/28/2006  . PERSONAL HX COLONIC POLYPS 05/02/2009  .  PSORIASIS 04/30/2008  . CKD (chronic kidney disease) 04/30/2008    creatinine 2.2 range     Neurologic Exam:  Mental Status: Alert, extubated, follows no verbal or visual commands.No verbal output. Cranial Nerves: II: Blinks to threat bilaterally and tracts my movements in room.  III,IV, VI: ptosis not present, extra-ocular motions intact bilaterally, pupils equal, round, reactive to light V,VII: Moves face symmetrically VIII: hearing normal bilaterally--looks to side voice is called to  IX,X: gag reflex present  XI: bilateral shoulder shrug XII: tongue midline Motor: Moves bilateral UE spontaneously, strong squeeze with right hand and weak grip with left. spontaneously raises both arms off bed. Minimally moves bilateral LE spontaneously.  Sensory: no withdrawal or response to pain in extremities.  Deep Tendon Reflexes: 2+ and symmetric throughout UE, 1+ right KJ, 2+ left KJ and no plantars Plantars: Right: downgoing   Left: upgoing    Lab Results: Lab Results  Component Value Date/Time   CHOL 120 02/08/2011  5:45 AM   Lipid Panel No results found for this basename: CHOL,TRIG,HDL,CHOLHDL,VLDL,LDLCALC in the last 72 hours  Studies/Results: No results found.  MEDICATIONS                                                                                                                        Scheduled:   . antiseptic oral rinse  15 mL Mouth Rinse QID  . chlorhexidine  15 mL Mouth Rinse BID  . darbepoetin (ARANESP) injection - NON-DIALYSIS  100 mcg Subcutaneous Q Wed-1800  . famotidine  20 mg Oral Daily  . feeding supplement (NEPRO CARB STEADY)  1,000 mL Per Tube Q24H  . feeding supplement  30 mL Per Tube BID  . insulin aspart  0-9 Units Subcutaneous Q4H  . levetiracetam  250  mg Intravenous Q12H  . metoprolol tartrate  25 mg Per Tube BID  . metroNIDAZOLE  500 mg Per Tube Q8H  . multivitamin  1 tablet Oral QHS  . simvastatin  40 mg Oral QHS  . Tamsulosin HCl  0.4 mg Oral QPC supper  . valproate sodium  1,500 mg Intravenous Q8H  . 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 Keppra has been decreased to 250 mg BID with no adverse effects.   Plan: 1. Continue current AED regime  Altered mental  status (03/20/2012) Assessment: Slightly more alert today.  No significant change in physical exam.   Plan: No further recommendations. At this time patient is a DNI and family has chosen to continue medical support.  Neurology will S/O    Felicie Morn PA-C [facility] [phone]  03/29/2012, 11:44 AM

## 2012-03-29 NOTE — Progress Notes (Addendum)
Chaplain Note: Chaplain visited with pt and members of pt's family.  Pt was in bed awake but drowsy and was not fully oriented.  Pt was extubated during this visit.  Per pt nurse, there are no plans for re-intubation.  Pt did not communicate in a meaningful way during this visit so it is impossible to make a spiritual assessment.  Chaplain provided spiritual comfort and support for pt and pt's family.  During this visit, chaplain noted matters of a spiritual / psycholgical nature involving pt's wife and son both of whom were at bedside.  Pt's wife seems unwilling to face the full gravity of the pt's prognosis.  Her unwillingness my simply be Buster Schueller-defense.  However, the manner in which she continually touches and kisses the pt, in full disregard of pt's isolation protocols, suggests deeper psycho-spiritual issues perhaps involving fear of being alone after the pt dies.  Pt's wife will not discuss those deep feelings.  Private conversation with pt's son, pt's only child, revealed deep emotional divisions between him and pt's wife.  Pt's wife is not the mother of pt's son.  There is much unresolved anger between the two.  Pt's family expressed appreciation for chaplain support.  I concur with Dr. Lamar Blinks note below with regard to the ethics of this matter.      Chaplain will continue to follow this matter.   03/29/12 1100  Clinical Encounter Type  Visited With Patient and family together  Visit Type Spiritual support  Referral From Nurse  Recommendations Continued spiritual care support  Spiritual Encounters  Spiritual Needs Prayer;Emotional  Stress Factors  Patient Stress Factors Health changes;Major life changes  Family Stress Factors Family relationships;Major life changes    Verdie Shire, [place] [phone]

## 2012-03-29 NOTE — Procedures (Signed)
Extubation Procedure Note  Patient Details:   Name: ELDRIGE PITKIN DOB: [birth year] MRN: [id number]   Airway Documentation:  Airway 7.5 mm (Active)  Secured at (cm) 23 cm 03/29/2012  9:13 AM  Measured From Lips 03/29/2012  9:13 AM  Secured Location Center 03/29/2012  9:13 AM  Secured By [employer] 03/29/2012  9:13 AM  Tube Holder Repositioned Yes 03/29/2012  9:13 AM  Cuff Pressure (cm H2O) 24 cm H2O 03/28/2012  7:28 PM  Site Condition Cool;Dry 03/28/2012  7:28 PM    Evaluation  O2 sats: stable throughout Complications: No apparent complications Patient did tolerate procedure well. Bilateral Breath Sounds: Diminished Suctioning: Airway No  Gwendolyn Grant 03/29/2012, 12:31 PM

## 2012-03-29 NOTE — Progress Notes (Signed)
Family still present at bedside. Checked in with [place] and [place]. Both state they believe Fletcher is comfortable at this time and deny any other needs.

## 2012-03-29 NOTE — Progress Notes (Signed)
Name: [name] MRN: [id number] DOB: 02-10-1946 LOS: 33  PCCM RESIDENT DAILY PROGRESS NOTE  History of Present Illness:  67 year old with history of alcohol abuse who presented to the hospital on December 6th with SOB, was found to have acute on chronic respiratory failure with EF of 40-45 percent who had an extremely complicated hospital course with pericardial effusion requiring a window, pleural effusion requiring a chest tube, renal failure requiring dialysis and liver failure from etoh. On 12/29 patient presented to the ICU after a 12 minute PEA cardiac arrest that was likely related to respiratory failure ?narcotic use for pain. Upon presenting to the ICU the patient was hypotensive and obtunded post code.   Lines/Draines  ETT: 12/29>>  R Diatek catheter>>  L IJ TLC 12/29>>>  R radial a-line 12/29>>12/31   Cultures:  12/30 C.diff  12/30 Ucx > 80.000 yeast  12/29 Bcx>>uptodate no growth  12/29 Res cx: RARE WBC PRESENT, PREDOMINANTLY PMNNO SQUAMOUS EPITHELIAL CELLS SEEN FEW YEAST WITH PSEUDOHYPHAE RARE GRAM POSITIVE RODS  12/20 Right pleural fluid: RARE WBC PRESENT, PREDOMINANTLY MONONUCLEAR NO ORGANISMS SEEN  12/20 Pericardium culture; NO WBC SEEN NO ORGANISMS SEEN  12/20 Urine culture : negative  12/10 Pleural effusion: negative  12/6 Ucx: E.coli - Pansensitive   Antibiotics:  Flagyl (12/31) >> stop 1/13 Oral Vancomycin (12/30)> > stop1/13 Zosyn 12/29>>>12/31   Tests / Events:  1. EEG 03/23/12 ; This is an abnormal EEG secondary to a polyspike activity emanating from the central region. 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  2. Palliative Care consult 03/23/12: Full code , Maintain full scope medical treatment  3. Repeat EEG 1/2: No change from 1/1  4. Palliative Care consult 03/26/12: Full code , Maintain full scope medical treatment . Except wife everyone else who was present during meeting ( son, pastor ) feels that patient does not  want current quality of life. It seems wife has not capacity to make decisions. Need for Ethic Consult .   5. Discussion with family on 1/6: Extubated patient on 1/7 at 10 am and then DNI, continue medical support . Unless distress then comfort.   Overnight events :   Vital Signs: Temp:  [96.7 F (35.9 C)-100.1 F (37.8 C)] 99.5 F (37.5 C) (01/07 0600) Pulse Rate:  [80-89] 85  (01/07 0600) Resp:  [4-26] 13  (01/07 0600) BP: (71-116)/(47-74) 94/56 mmHg (01/07 0600) SpO2:  [94 %-100 %] 96 % (01/07 0600) FiO2 (%):  [30 %-41 %] 30.7 % (01/07 0600) Weight:  [149 lb 7.6 oz (67.8 kg)] 149 lb 7.6 oz (67.8 kg) (01/07 0200) I/O last 3 completed shifts: In: 2949 [I.V.:650; NG/GT:1710; IV Piggyback:589] Out: 5093 [Urine:176; WUJWJ:1914; Stool:500]    Physical Examination:  General: awake  Neuro: sleepy today for me. But follows commands per nurse   HEENT: ETT in place  Cardiovascular: s1s2 with 2/6 SM  Lungs: rhonchi , few wheezing  Abdomen: Soft, non-tender, BS present   Musculoskeletal: Left arm mildly swollen  Skin: Multiple bruises scattered throughout  Ventilator settings: Vent Mode:  [-] PRVC FiO2 (%):  [30 %-41 %] 30.7 % Set Rate:  [14 bmp] 14 bmp Vt Set:  [600 mL] 600 mL PEEP:  [4.5 cmH20-5 cmH20] 5 cmH20 Pressure Support:  [8 cmH20] 8 cmH20 Plateau Pressure:  [16 cmH20-24 cmH20] 19 cmH20 No results found for this basename: PHART:3,PCO2:3,PO2ART:3,TCO2:3,HCO3:3 in the last 72 hours Labs and Imaging:   Basic Metabolic Panel:  Lab 03/29/12 7829  03/28/12 0426  NA 135 139  K 3.6 3.8  CL 99 102  CO2 25 28  GLUCOSE 114* 109*  BUN 38* 22  CREATININE 1.73* 1.00  CALCIUM 8.4 8.3*  MG 2.5 2.4  PHOS 3.1 2.1*   Liver Function Tests:  Lab 03/29/12 0500 03/28/12 0426 03/26/12 1130 03/24/12 0410  AST -- -- 22 18  ALT -- -- 9 6  ALKPHOS -- -- 112 105  BILITOT -- -- 0.4 0.4  PROT -- -- 6.6 5.9*  ALBUMIN 1.9* 2.1* -- --    CBC:  Lab 03/29/12 0500 03/28/12 0426  WBC  10.8* 11.1*  NEUTROABS -- --  HGB 10.7* 11.5*  HCT 34.8* 38.7*  MCV 81.9 82.5  PLT 109* 98*    CBG:  Lab 03/29/12 0339 03/29/12 0022 03/28/12 2042 03/28/12 1632 03/28/12 1157 03/28/12 0808  GLUCAP 105* 122* 94 104* 138* 114*    Coagulation:  Lab 03/26/12 0440  LABPROT 18.0*  INR 1.54*   Assessment and Plan:  ASSESSMENT:  Acute respiratory failure after PEA arrest.  Hx of COPD.  PLAN:  Extubated today, will assess cpap 5 ps 5-10 , assess prediction for resp status post extubation Neg balance has benefit him Discussion with family on 1/6: Extubated patient on 1/7 at 10 am and then DNI, continue medical support . Unless distress then comfort.  Suction aggressive Likely limited in tolerating hd  CARDIOVASCULAR  ASSESSMENT:  Shock 2nd to cardiac arrest, sepsis, resolved  Acute on chronic systolic CHF  Hx of A fib/flutter, CAD, HTN, Hyperlipidemia.   PLAN:  Keep negative fluid balance  Continue Simvastatin and metoprolol  High risk for falls therefore questionable Anticoagulation  No role pressors  RENAL  ASSESSMENT:  Renal failure now on HD  Hypokalemia  PLAN:  CVVHD stopped on 1/6 per renal Follow labs and start intermittent HD as needed, likely will not tolerable  GASTROINTESTINAL  ASSESSMENT:  Hepatic failure in setting of ETOH and cardiac arrest - improving  Nutrition. Albumin 1.9  Diarrhea 2nd to C diff - improving , afebrile  PLAN:  1. Continue Flagyl / oral vanc  2. 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 Is more alert with family 1-4 responds to voice.  EEG 1/1: polyspike activity emanating from the central region consistent with a diagnosis of myoclonic status epilepticus.  EEG 1/2 : No change from 1/1   PLAN:  Myoclonus / seizures resolved  Keppra, depakote per neuro Klonopin slow reduction Waxing and waning. It was clear from some family members that he would not want to be dependent on machines  Global: weaning, extubation, DNI, HD off, evaluate his resp status.  Best practices / Disposition:  -->ICU status under PCCM  --> code : DNR -->SCD for DVT Px  -->Pepcid for GI Px  -->ventilator bundle  -->diet - TF  Consults : Neuro, Nephro , Palliative     ILLATH,[name] 03/29/2012, 6:58 AM  I have personally obtained a history, examined the patient, evaluated laboratory and imaging results, formulated the assessment and plan and placed orders.  CRITICAL CARE:  The patient is critically ill with multiple organ systems failure and requires high complexity decision making for assessment and support, frequent evaluation and titration of therapies, application of advanced monitoring technologies and extensive interpretation of multiple databases. Critical Care Time devoted to patient care services described in this note is 30 minutes.   Mcarthur Rossetti. Tyson Alias, MD, FACP  Pgr: [phone]  [facility]

## 2012-03-29 NOTE — Progress Notes (Deleted)
CRITICAL VALUE ALERT  Critical value received:  co2 on ABG 79  Date of notification:  03/29/12  Time of notification:  0415  Critical value read back:istat result  Nurse who received alert:  Crist Fat RN  MD notified (1st page):  Dr. Virgina Organ  Time of first page:  NA- MD on unit, reported value in person  MD notified (2nd page):NA  Time of second page:NA  Responding MD:  Virgina Organ  Time MD responded:  [phone]

## 2012-03-29 NOTE — Progress Notes (Signed)
Patient Wife Lawanna Kobus at bedside smacking patient in face telling him to wake up. She is laying over the side rails yelling at him to breath more.  I entered the room to provide comfort to [facility] and pulled a chair to the bedside for her to sit in and still be able to reach [place].  Lawanna Kobus continued to turn the patients head to face others in the room asking if he could see them.  I attempted to provide reassurance that the patient appeared peaceful and comfortable at this time.  I continued to provide comfort to [facility] and reassure her that Rodd was comfortable.  While turning off the alarms on the monitor Lawanna Kobus became upset asking if I was unplugging Davey.  I assured her I was just turning off some of the alarms on the monitor to make the room more peaceful.  I checked in with the rest of the visitors to determine if there were any needs at this time none were expressed

## 2012-03-30 LAB — HEPARIN INDUCED THROMBOCYTOPENIA PNL: Heparin Induced Plt Ab: NEGATIVE

## 2012-03-30 MED ORDER — MIDAZOLAM HCL 2 MG/2ML IJ SOLN: 2.0000 mg | INTRAMUSCULAR | Status: DC | PRN

## 2012-04-05 ENCOUNTER — Ambulatory Visit: Payer: Self-pay | Admitting: Internal Medicine

## 2012-04-11 ENCOUNTER — Ambulatory Visit: Payer: Medicare Other | Admitting: Pulmonary Disease

## 2012-04-14 NOTE — Discharge Summary (Signed)
[name], [name]                ACCOUNT NO.:  [id number]  MEDICAL RECORD NO.:  [id number]  LOCATION:  6711                         FACILITY:  [facility]  PHYSICIAN:  Nelda Bucks, MD DATE OF BIRTH:  June 12, 1945  DATE OF ADMISSION:  03/20/2012 DATE OF DISCHARGE:  03/31/2012                              DISCHARGE SUMMARY   HISTORY:  This is a 67 year old white male with a history of alcohol abuse who presented to the hospital __________ short of breath, was found to have acute-on-chronic respiratory failure with an ejection fraction of 40%, who had an extremely complicated hospital course including pericardial effusion requiring pericardial window by Dr. Alanda Amass, pleural effusion requiring chest tube placement, renal failure requiring dialysis and liver failure from alcoholism.  On December 29, the patient presented to the ICU after a 12-minute-plus PEA cardiac arrest.  It was likely related to respiratory failure __________.  The patient was suffering from multiorgan dysfunction syndrome from presentation after cardiac arrest.  The patient was suffering from significant hypotension and myoclonus and status epilepticus.  Neurology was consulted and provided care for this patient's seizures.  The patient had some slight improvements in neurologic reduction in myoclonus.  He continued to require __________ dialysis.  He had an endotracheal tube as noted placed on December 29 in the right Diatek catheter, left IJ triple-lumen catheter placed on December 29, and __________ placed on December 29 which was removed on 31st.  Cultures were significant for urine culture, 80,000 yeast on December 30.  His pericardial cultures and pleural cultures were negative.  Urine culture from December 6 also noted to have E. coli.  He was treated with Flagyl and Zosyn and oral vancomycin __________ December 29 after cardiac arrest.  EEGs were performed, which showed abnormal EEG secondary to polyspike  activity emanating from the central region.  Palliative care consultation was made on March 23, 2012. Initially, there were oppositions from the patient's wife for changing code status, as the wife continued to see her husband who had a terminal underlying liver disease make actually no progress neurologically to have a functional recovery.  Continued discussions were had with myself and palliative care.  The patient continued to be on the ventilator machine and again with lack of progress neurologically and with continued renal failure.  Wife initially made the decision to not resuscitate the patient under any circumstances.  Also to provide extubation without the intention to reintubate.  The patient weaned reasonably effectively with pressure support of 5.  The patient was extubated per wife's understanding and wishes as well as complete report from his son.  __________ multiple recollections of the patient's [place]cut wishes not to have __________ measures provided under these circumstances and making a note that the patient would not want to be continued on ventilatory support __________ measures.  Upon extubation, the patient developed respiratory failure requiring morphine and comfort care.  Wife was __________ at the bedside including multiple friends from her Elesa Hacker allowed by the wife to visit.  The patient eventually expired.  FINAL DIAGNOSES:  Upon death, 1. 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 2. Acute renal failure. 3. Pericardial effusion, status post window placement. 4. Pleural effusions,  status post chest tube placements. 5. Sepsis. 6. Status post cardiac arrest. 7. Seizure activity secondary to anoxic brain injury.     Nelda Bucks, MD     DJF/MEDQ  D:  04/13/2012  T:  04/14/2012  Job:  [id number]

## 2012-04-23 NOTE — Progress Notes (Signed)
Clinical Social Work Department BRIEF PSYCHOSOCIAL ASSESSMENT 04/22/2012  Patient:  [name], [name]     Account Number:  [id number]     Admit date:  03-25-12  Clinical Social Worker:  Dennison Bulla  Date/Time:  04/15/2012 10:45 AM  Referred by:  RN  Date Referred:  04/02/2012 Referred for  Crisis Intervention   Other Referral:   Interview type:  Family Other interview type:    PSYCHOSOCIAL DATA Living Status:  FAMILY Admitted from facility:   Level of care:   Primary support name:  [name] Primary support relationship to patient:  CHILD, ADULT Degree of support available:   Strong    CURRENT CONCERNS Current Concerns  Adjustment to Illness   Other Concerns:    SOCIAL WORK ASSESSMENT / PLAN CSW received referral from RN to assist with family dynamics and support during patient's decline. CSW reviewed chart and spoke with PMT SW who is assisting with case.    CSW met with patient's son in hallway. [name], grandson and granddtr were at bedside. Son reports that he does not want to speak in room where wife is present. Son reports he is struggling with dealing with patient's decline and with conflict with patient's wife. Wife is 2nd marriage and is not son's mother. CSW allowed son time to express frustrations and to explain family dynamics. Son reported that family had been working closely with PMT and expressed appreciation for PMT SW support. CSW and son discussed boundaries and coping with decline. CSW and son created a plan for son's wife to come and sit with patient so that son could take a break from the hospital and from patient's wife.    Per RN and MD, patient has poor prognosis and expected to have hospital death. If prognosis changes, please consult CSW to assist with residential hospice placement.   Assessment/plan status:  Psychosocial Support/Ongoing Assessment of Needs Other assessment/ plan:   Information/referral to community resources:   CSW provided  support for son. CSW informed patient of support groups in community if interested.    PATIENT'S/FAMILY'S RESPONSE TO PLAN OF CARE: Patient unresponsive. Son appreciative of CSW consult but grieving during this time and feels frustrated with family.

## 2012-04-23 NOTE — Progress Notes (Signed)
[facility]  Subjective:  Patient has transitioned to comfort care. After extubation, progressively declining   Objective: Vital signs in last 24 hours: Blood pressure 59/45, pulse 86, temperature 95.3 F (35.2 C), temperature source Core (Comment), resp. rate 7, height 5\' 5"  (1.651 m), weight 149 lb 7.6 oz (67.8 kg), SpO2 100.00%.    PHYSICAL EXAM General--mouth gaping open Chest-- R HD tunnel cath.  Heart--no rub Abd--nontender Extr--UE edema L arm > R. Trace LE edema, AVF patent L forearm     Date   Weight  17 Dec  72 kg  20 Dec  73.9  23 Dec  77.9  26 Dec  74.5  30 Dec  80.6  31 Dec   78.3  1 Jan    81  2 Jan    81.5  3 Jan    79.5  4 jan  75.1 kg 5 Jan  70.2 kg 6 Jan  67 kg 7 Jan  67.8kg No weight today  Lab Results: No labs today as comfort care only  Lab 03/29/12 0500 03/28/12 0426 03/27/12 1600 03/27/12 0423  NA 135 139 137 --  K 3.6 3.8 3.8 --  CL 99 102 101 --  CO2 25 28 27  --  BUN 38* 22 24* --  CREATININE 1.73* 1.00 1.03 --  ALB -- -- -- --  GLUCOSE 114* -- -- --  CALCIUM 8.4 8.3* 8.2* --  PHOS 3.1 2.1* -- 2.2*     Basename 03/29/12 0500 03/28/12 0426  WBC 10.8* 11.1*  HGB 10.7* 11.5*  HCT 34.8* 38.7*  PLT 109* 98*     I have reviewed the patient's current medications. Scheduled: None   Continuous:    . morphine 5 mg/hr (03/29/12 1644)    Assessment/Plan: 67 year old with multiple medical issues with complicated hospital course including pericardial window for pericardial effusion who was started on HD Dec 30 after PEA arrest on 12/29 and worsening kidney function with CHF refractory to medical management. Patient on CVVH through 1/6. Baseline Cr 1.68 in August but 2.8 at admission. AVF in L forearm 27 Dec  1. AKI superimposed on CKD-- CXR with B pl eff and bibasilar interstitial edema persistent 1/5 with no follow up ordered. still oliguric (135 cc) *given transition to comfort care, will sign off. Patient poor  candidate for HD given multiple medical problems, transition to DNR/DNI, continued hypotension. 2. Anemia-- aranesp d/ced for comfort.  3. Secondary PTH-- No Hectorol as no further plans HD.    Other medical issues per primary team: 1. Ischemic cardiomyopathy/CHF--did not respond to diuretics. Dialysis started 30 Dec. CXR showed B pl eff and patchy B lower lobe infiltrates. Same plan as above  2. Pericardial effusion--S/P window 11 Mar 2012--no new suggestions  3. Afib, s/p nodal ablation + pacer--no new suggestions  4. S/P PEA arrest 29 Dec and hypoxic respiratory failure--progressive decline after extubation 5. + C Diff--on flagyl and PO vanc  7. Thrombocytopenia-comfort care only.     LOS: 34 days   Aldine Contes. Marti Sleigh, MD, PGY2 04/03/2012 6:55 AM

## 2012-04-23 NOTE — Progress Notes (Signed)
Chaplain Note:  Chaplain visited with pt and pt's family.  Pt was in bed, asleep, and did not communicate during this visit.  Chaplain provided spiritual comfort, support, and prayer for pt and family.  Family expressed appreciation for chaplain support.  The pt's wife continues to be unrealistic in terms of facing the seriousness of the pt's condition but has progressed some from my previous visit.    Pt's family continues to appreciate chaplain support.  Chaplain will continue visits.  2012-04-05 1300  Clinical Encounter Type  Visited With Patient and family together  Visit Type Spiritual support;Follow-up  Referral From Nurse  Recommendations Continued spiritual support  Spiritual Encounters  Spiritual Needs Emotional;Prayer  Stress Factors  Patient Stress Factors Health changes;Major life changes  Family Stress Factors Family relationships;Loss of control;Loss   Verdie Shire, Chaplain [phone]

## 2012-04-23 NOTE — Progress Notes (Signed)
Informed Wife [name] that patient had a bed on unit 6700 room 6711. I informed them of the process to move him to the new room and she agreed with the plan.  Made contact with Pt son [name] to inform him of the transfer.

## 2012-04-23 NOTE — Progress Notes (Signed)
I have seen and examined this patient and agree with the plan of care. Elvis Coil W 04/07/2012, 3:04 PM

## 2012-04-23 NOTE — Progress Notes (Signed)
03/30/12 pt transferred from 2100 at 2000 with agonal breathing,pt not breathing at 2005,no respirations,no pulse,no heart beat or breath sounds auscultated.No code called per advance directive.Death pronounced by two RN. Family members at bedside,pts sister fainted at bedside unwitness by staff,KIM the [facility] called to floor family member given emotional support no c/o pain..Indwelling urinary catheter and flexaseal removed.Postmortem care performed body sent to morgue.[place] [facility] called donor number  [phone],[facility] notified.Artemio Aly RN

## 2012-04-23 NOTE — Progress Notes (Signed)
Patients wife Lawanna Kobus approached me about speaking with her in private.  Lawanna Kobus told me that she felt could trust me now that I had stuck up for her regarding the bible earlier in the day.  She went on to describe great dysfunction in the relationship between herself and Dwayne and described being alone with his family as being "thrown in the lions den".  I She stated she was worried that they were all going to "gang up on her tonight after 8 pm" and tell her what to do with his body when he dies.  I listened to her talk and attempted to redirect her towards positive thoughts and actions and she continued to tell me what she was going to do to other people if they tried to take her chair at the patient bedside from her.    She stated she was upset that Dwayne had asked the preacher to leave the Patient room yesterday and put me on the phone with him.  I thanked him for his concern and informed him that I was not going to be able to fix all of the dysfunction that was built prior to today but that I did not see it as a problem that he and his wife visit.  I informed him and Lawanna Kobus that myself or Suzette Battiest the CSW with Palliative care to provide support.  Angel and her preacher continued to bring up negative situations and talk about what they were going to say when they were confronted by Dwayne and his family this evening regarding end of life care/funeral arrangements. I continued to provide support and then made contact with [place] to help support this family.

## 2012-04-23 NOTE — Progress Notes (Signed)
Name: LUX MEADERS MRN: [id number] DOB: [birth year] LOS: 34  PCCM RESIDENT DAILY PROGRESS NOTE  History of Present Illness:  67 year old with history of alcohol abuse who presented to the hospital on December 6th with SOB, was found to have acute on chronic respiratory failure with EF of 40-45 percent who had an extremely complicated hospital course with pericardial effusion requiring a window, pleural effusion requiring a chest tube, renal failure requiring dialysis and liver failure from etoh. On 12/29 patient presented to the ICU after a 12 minute PEA cardiac arrest that was likely related to respiratory failure ?narcotic use for pain. Upon presenting to the ICU the patient was hypotensive and obtunded post code.    Overnight Events:  Patient was extubated yesterday. Doing well initially but then declined with respiratory status.  Patient was placed on comfort care only. Morphine drip was initiated .  Overnight more episodes of apnea .   Vital Signs: Temp:  [95.3 F (35.2 C)-99.7 F (37.6 C)] 95.3 F (35.2 C) (01/08 0600) Pulse Rate:  [80-88] 86  (01/08 0500) Resp:  [5-25] 5  (01/08 0600) BP: (56-107)/(31-63) 64/49 mmHg (01/08 0600) SpO2:  [71 %-100 %] 100 % (01/08 0500) FiO2 (%):  [30.4 %-40.6 %] 40.6 % (01/07 1000) I/O last 3 completed shifts: In: 2593.8 [I.V.:576.3; NG/GT:1300; IV Piggyback:717.5] Out: 775 [Urine:280; Other:295; Stool:200]  Physical Examination: Neuro: drowsy but arousable  Cardiovascular: s1s2 with 2/6 SM  Lungs: rhonchi , few wheezing, rr 12 Abdomen: Soft, non-tender, BS present   Skin: Multiple bruises scattered throughout  Assessment and Plan: Patient is at this point comfort care only. Continue Morphine drip titrate to rr 12-18 or discomfort, no role escalation O2, wife is supportive of comfort care plan as spelled out 2 days prior Please also note, there has been concerns of poor judgment / behavior at bedside from wife, abnormal behavior. Son at  bedside , described pt wishes to be clear for comofrt , no continued support under the prior cicrumcstances . Supportive measurement  To floor Low threshold addition versed, prior myoclonus / seizures   ILLATH,[name] 03/26/2012, 6:58 AM  I have fully examined this patient and agree with above findings.    And edited in full Updated son  Mcarthur Rossetti. Tyson Alias, MD, FACP Pgr: [phone] [facility]

## 2012-04-23 NOTE — Progress Notes (Signed)
Chaplain responded to page following patient's death.  Provided grief care and emotional support for the family.  Listened empathically as different family members voiced their grievances with one another. Worked to restore and maintain peace when things got heated.  Stayed on floor until both groups of family members left.  Rutherford Nail Chaplain

## 2012-04-23 NOTE — Progress Notes (Addendum)
Family present at bedside on morning assessment, discussed plan with them to move to a more private room and informed them that a "comfort cart" had been ordered.  Reinforced education that with the current c-diff infection they would not be allowed to eat in the room but that the food and beverages could be taken to the waiting room after washing their hands.  Angel and [employer] both stated they were comfortable with that plan.  Reinforced to [facility] the importance of infection prevention as she was sleeping face first on Blaike's arm with her mouth open on his arm, and when she awoke to talk to me she was using her gloves to wipe around her mouth and eyes.  Discussed the mode of transport for c-diff and emphasized the hand to mouth transmission.    Patient comfortable on assessment, both Angel and Dwayne expressed that they thought he was comfortable as well.  After assessment I was called back to the room by [facility] who asked if I thought she had enough time to go home and let her dogs out.  I expressed to her that since Cassie had hung in overnight I thought it would be ok to take a break , but that I could not make any guarantees and unfortunately did not know the future.  We talked about where his vitals signs were this morning as compared to last night.  When I spoke about how comfortable Yogesh appeared to me as his breathing was no longer labored I informed her this process could last  Couple hours to a couple of days as Aleck appeared more comfortable compared to yesterday evening. Lawanna Kobus stated she "knew it was not his time" and left the room.

## 2012-04-23 NOTE — Progress Notes (Signed)
Patient [name]      DOB: 10/14/1945      VWU:[id number]  Patient seen and evaluated prior to discharge from the ICU. Case discussed with Dr. Tyson Alias. Introduce myself to the family is interpretation her and offer emotional support. Patient at present is unresponsive and comfortable. We'll continue to follow for symptom management once he transition to the ICU.   Mande Auvil L. [name], MD MBA The Palliative Medicine Team at [facility] Phone: [phone] Pager: [phone]

## 2012-04-23 NOTE — Progress Notes (Signed)
Palliative Medicine Team SW Extensive psychosocial follow up. Continue to facilitate communication between pt's wife and son. Was able to get family to compromise on visitation of wife's pastor, limiting it to he and his wife only. Also feel it is best to leave choices for final arrangement until after pt's death as it is very anxiety provoking for wife; have been encouraging her to think ahead however. Provided support and reassurance to wife who continues to require repeated validation of choices made for pt. Wife continues to exhibit pressured and tangential speech, endorses racing thoughts, no clear signs that she is experiencing internal stimuli. Encouraged good self-care to both wife and son. At this time, wife continues to cling to "me vs them" narrative despite efforts to encourage positive thinking and evidence to contrary. Reassured wife that medical team is here for the best interest of pt and all family members. Wife tearful, able to participate in some reminiscence today about the good times with pt. Wife discussed how they met as teenagers and then remet later in life; they have been married for 32 years. Family very appreciative of support. Will continue to follow tomorrow. Please feel free to page/call me as needed.   [name], [place] Pager [phone] PMT Phone [phone]

## 2012-04-23 NOTE — Progress Notes (Signed)
Clinical Social Work  Son approached CSW regarding arrangements for patient after he passes away. CSW and son discussed if any funeral home arrangements had been made. Son reports that he is unsure of plans and that his wife usually makes plans. CSW offered funeral home list and encouraged son to contact CSW with further questions. CSW will continue to follow.  [place], [place] [phone]

## 2012-04-23 NOTE — Progress Notes (Signed)
Provided reeducation to patient wife regarding infection control as she continues to take food and open beverages into a c-diff isolation room.  Lawanna Kobus states she understands and yet she continues to do the opposite of what she says she understands.  She continues to contaminate the shared "comfort cart" with drinks and snacks for the entire support system.  I cleaned the cart and provided education to [place] again.

## 2012-04-23 NOTE — Progress Notes (Signed)
Called to patient room by visitor. [name] wanted to ask if I thought the bible she had laying on the patients stomach as too heavy and possibly making him uncomfortable.  I informed her [name] appeared to be very comfortable and that I did not think it was a bother to him at all. She stated she had been reading it to him and I encouraged her to continue to do that.

## 2012-04-23 DEATH — deceased

## 2012-05-04 ENCOUNTER — Ambulatory Visit: Payer: Medicare Other | Admitting: Vascular Surgery

## 2012-08-31 ENCOUNTER — Other Ambulatory Visit: Payer: Self-pay | Admitting: Internal Medicine

## 2014-02-24 IMAGING — CR DG CHEST 2V
2 series · 2 of 2 positions shown · non-contrast
Comparison: Two-view chest 03/06/2012.

CLINICAL DATA: Pleural effusion and cough.

CHEST - 2 VIEW

[w chest lat]
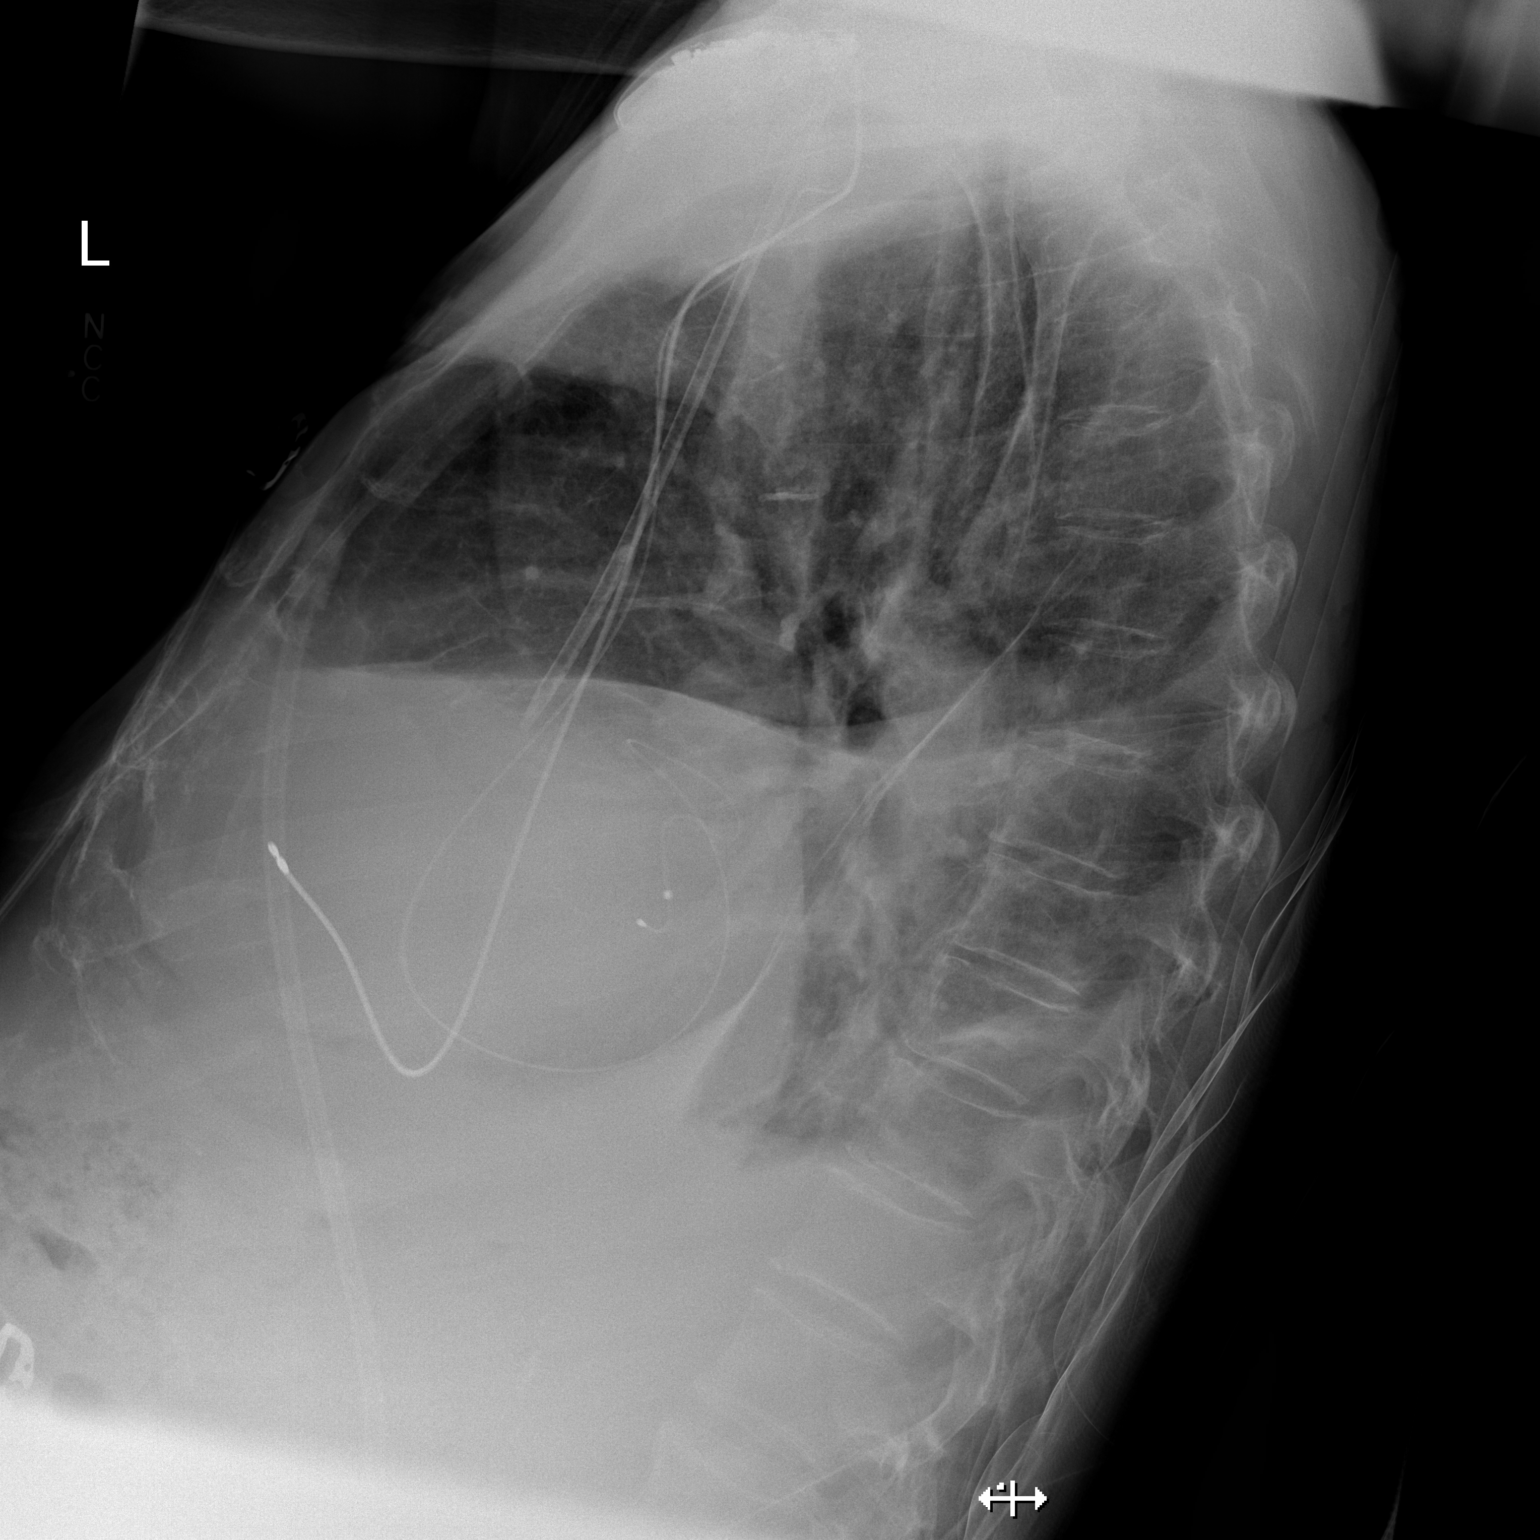

[x chest ap]
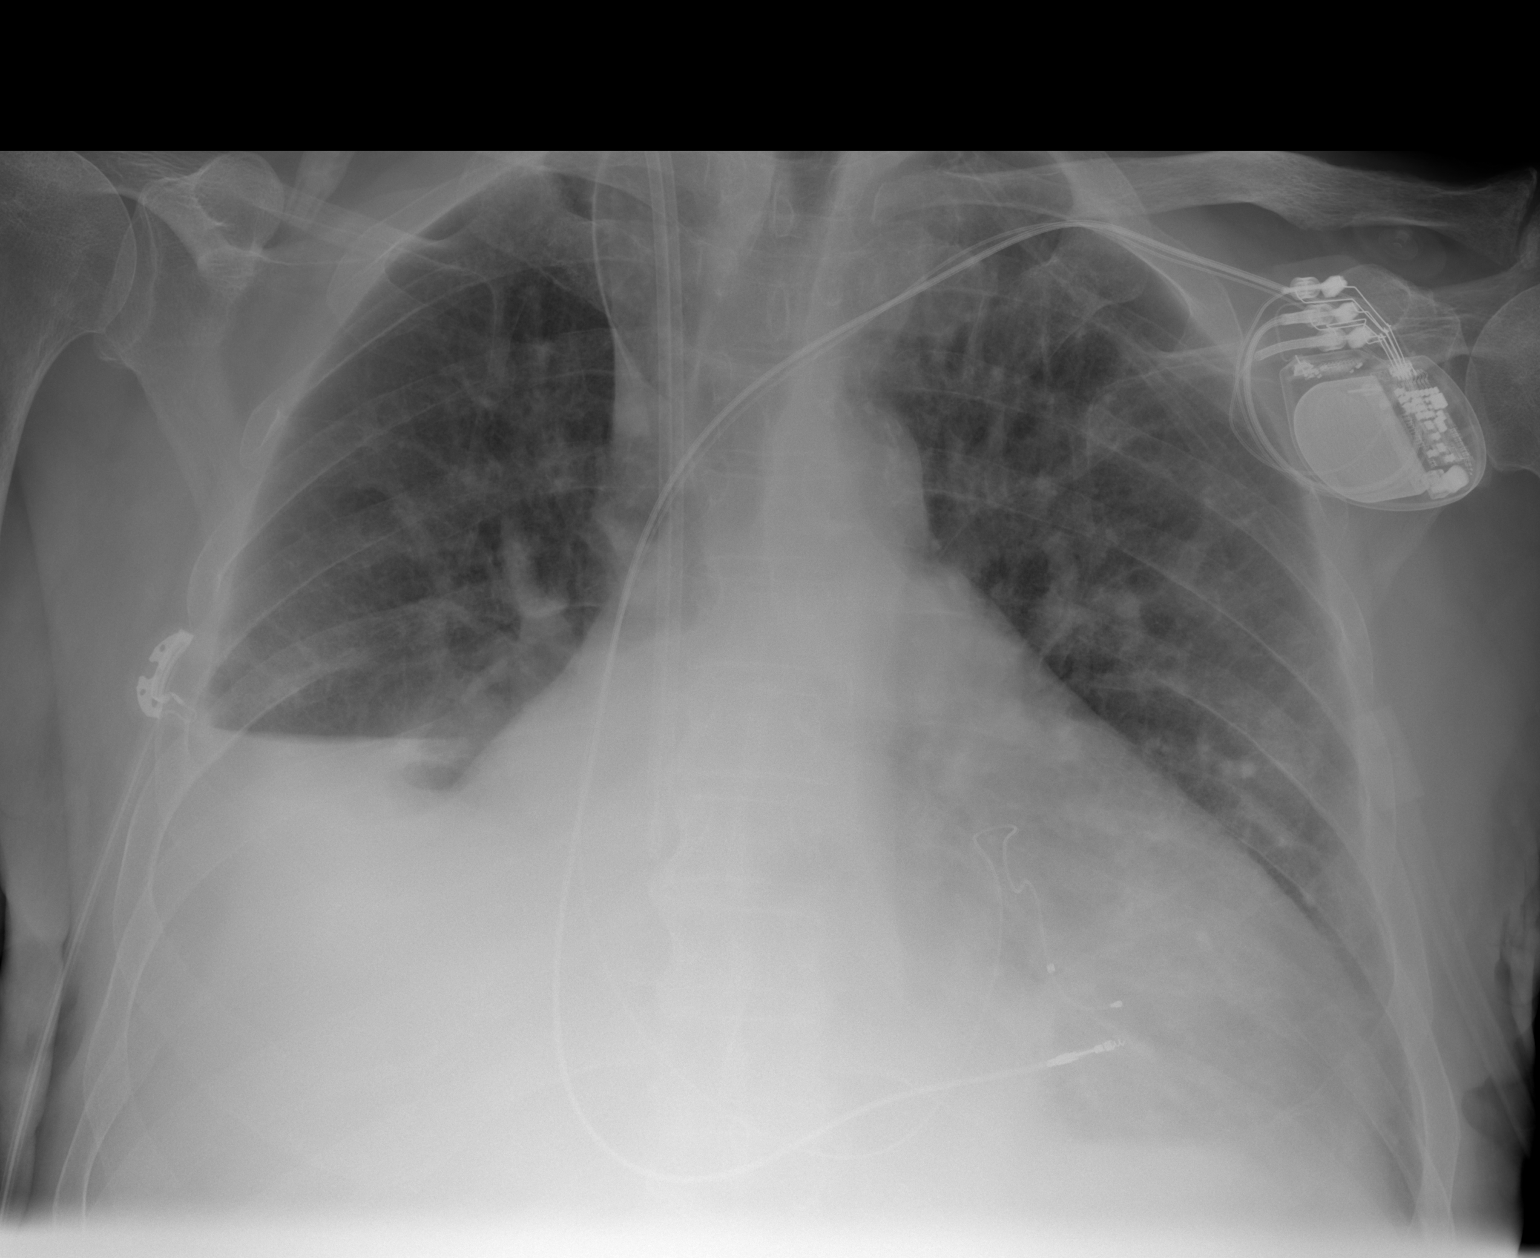

[2 of 2 positions shown; findings below may reference images not displayed]

FINDINGS: The heart is enlarged.  Pacing wires are in place.  This
interstitial edema is stable.  Bilateral pleural effusions are
stable, right greater than left.  Associated airspace disease is
evident.  The visualized soft tissues and bony thorax are
unremarkable.
IMPRESSION: 1.  Stable cardiac enlargement and pulmonary edema compatible with
congestive heart failure.
2.  Stable bilateral pleural effusions, right greater than left.

## 2014-02-24 IMAGING — XA IR FLUORO GUIDE CV LINE*R*
1 series · 1 of 1 positions shown · non-contrast
Comparison: none

CLINICAL HISTORY: 66-year-old with acute renal failure.

[Series 1: run · 1 of 1 slices shown]
[im 1/1]
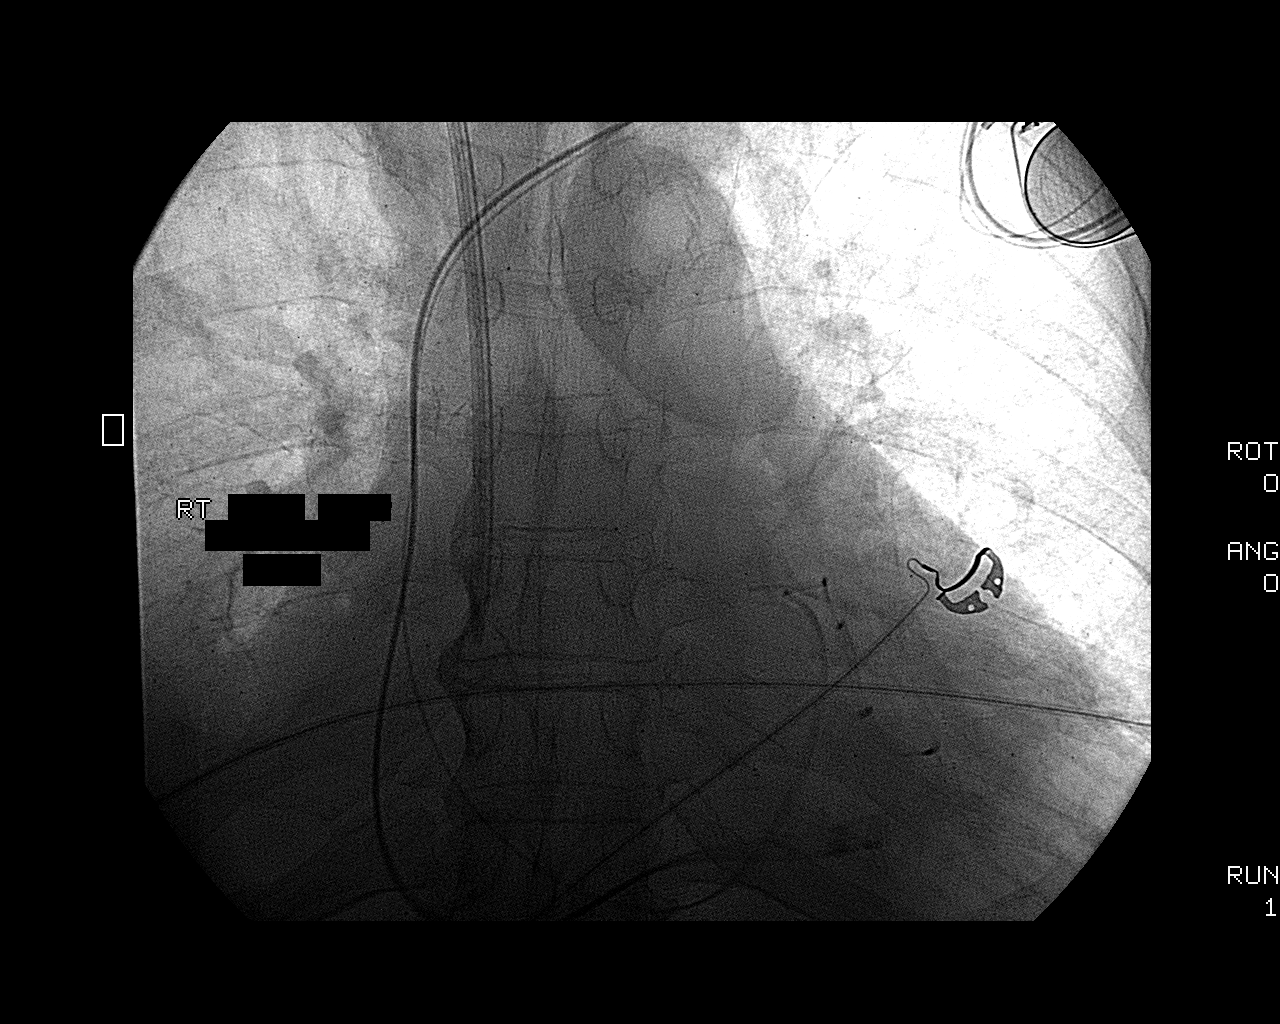

[1 of 1 positions shown; findings below may reference images not displayed]

PROCEDURE(S): PLACEMENT OF DIALYSIS CATHETER WITH ULTRASOUND AND
FLUOROSCOPIC GUIDANCE

Medications:None

Moderate sedation time:None

Fluoroscopy time: 0.2 minutes

Procedure:The procedure was explained to the patient.  The risks
and benefits of the procedure were discussed and the patient's
questions were addressed.  Informed consent was obtained from the
patient.  Ultrasound confirmed a patent right internal jugular
vein.  The right side of the neck was prepped and draped in a
sterile fashion.  Maximal barrier sterile technique was utilized
including caps, mask, sterile gowns, sterile gloves, sterile drape,
hand hygiene and skin antiseptic.  The skin was anesthetized with
1% lidocaine. 19 gauge needle was directed into the right internal
jugular vein with ultrasound guidance.  Wire was advanced into the
SVC and IVC.  The tract was dilated to accommodate a 20 cm
Trialysis catheter.  The three lumens aspirated and flushed well.
The proper amount of heparin was placed within the dialysis lumens.
Catheter sutured to the skin. Fluoroscopic and ultrasound images
were taken and saved for documentation.
FINDINGS: Catheter tip at the junction of SVC and right atrium.

Complications: None
IMPRESSION: Successful placement of a temporary dialysis catheter
with ultrasound and fluoroscopic guidance.

## 2014-02-25 IMAGING — CT CT ABD-PELV W/O CM
2 of 4 series · 16 of 46 positions shown, 18 images · non-contrast
Comparison: CT of the abdomen and pelvis 05/04/2009.  Chest x-ray
03/07/2012.

CT CHEST

CLINICAL DATA: Recent weight loss.  No effusions and ascites.
Evaluate for malignancy.

CT CHEST, ABDOMEN AND PELVIS WITHOUT CONTRAST
TECHNIQUE: Multidetector CT imaging of the chest, abdomen and
pelvis was performed following the standard protocol without IV
contrast.

[Series 2: c/a/p 5.0 b31f · axial · 0.79mm/px · z∈[-644,-69]mm · 13 of 129 slices shown, 15 images]
[im 7/129  soft-tissue]
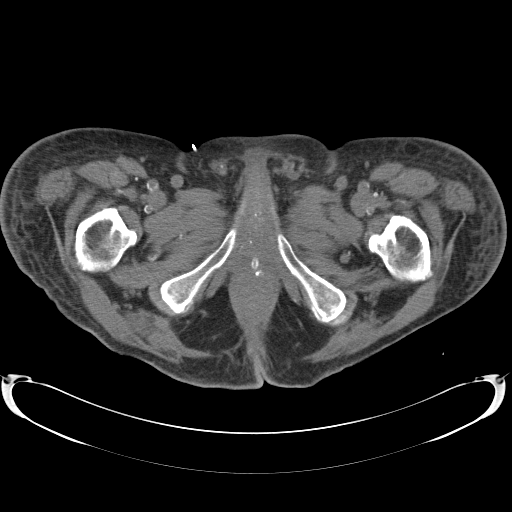
[im 7/129  bone]
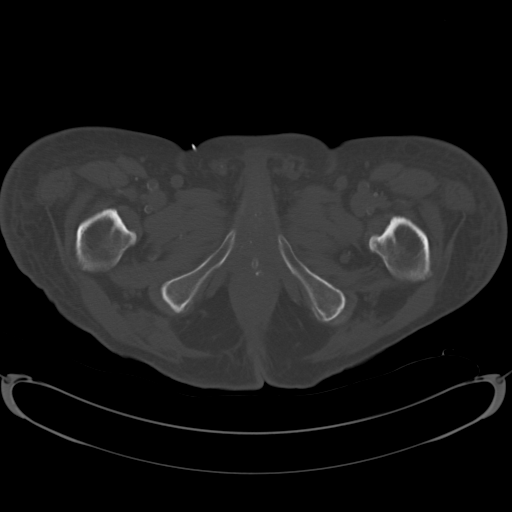
[im 20/129  soft-tissue]
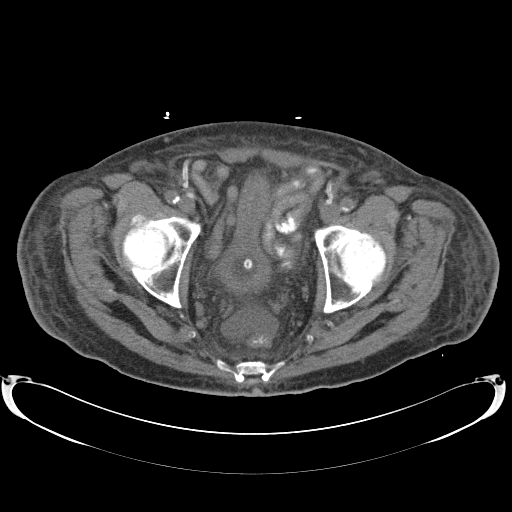
[im 26/129  soft-tissue]
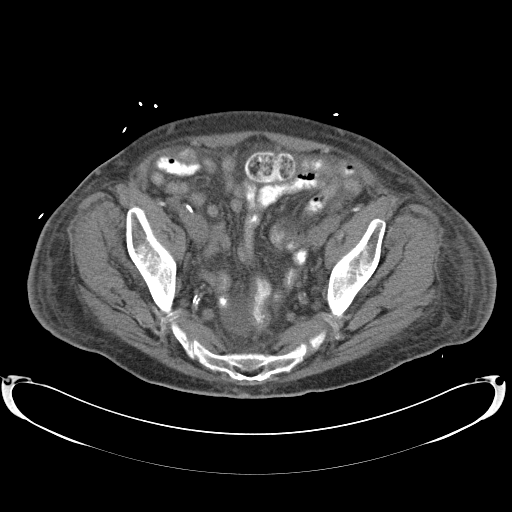
[im 39/129  soft-tissue]
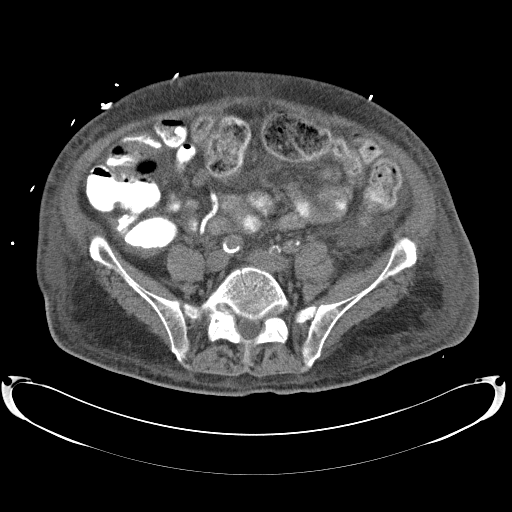
[im 45/129  soft-tissue]
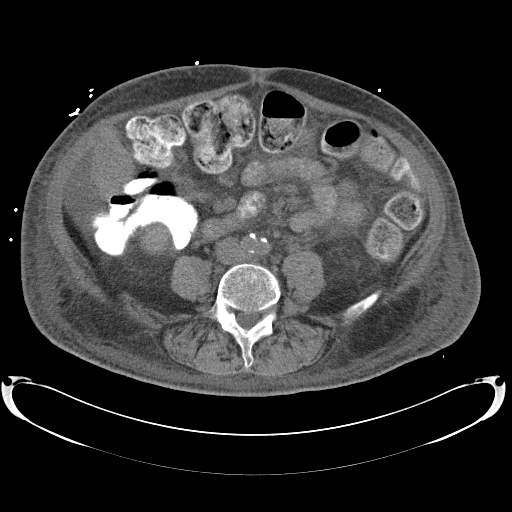
[im 58/129  soft-tissue]
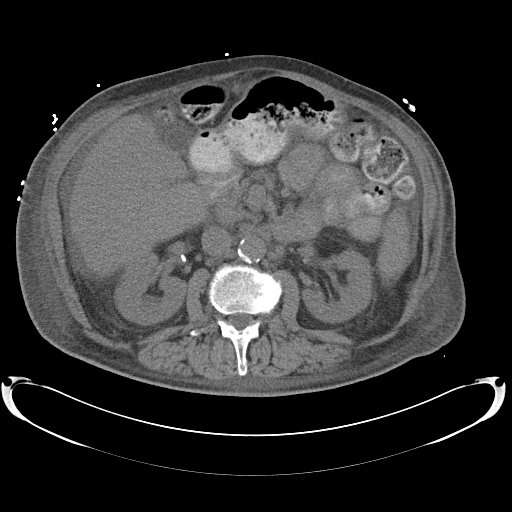
[im 65/129  soft-tissue]
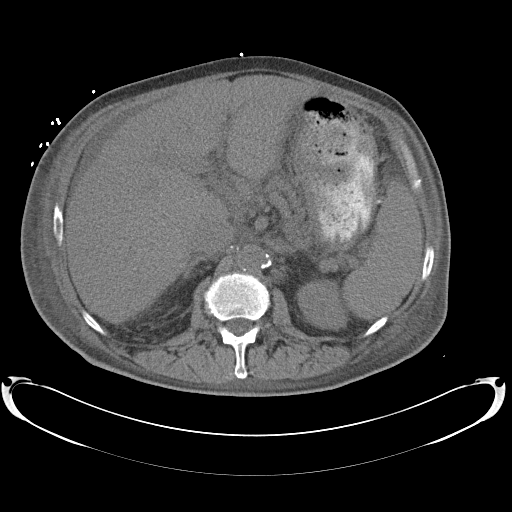
[im 71/129  soft-tissue]
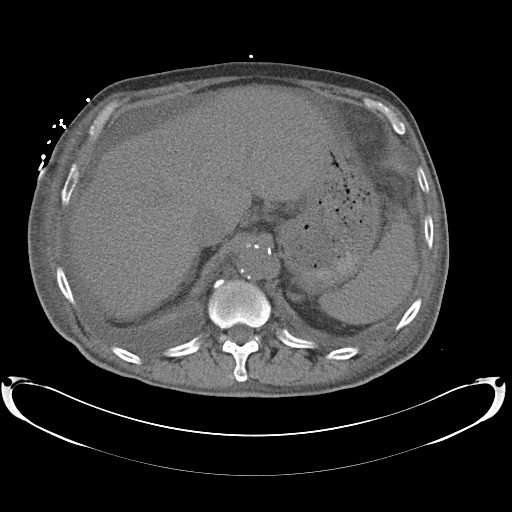
[im 84/129  soft-tissue]
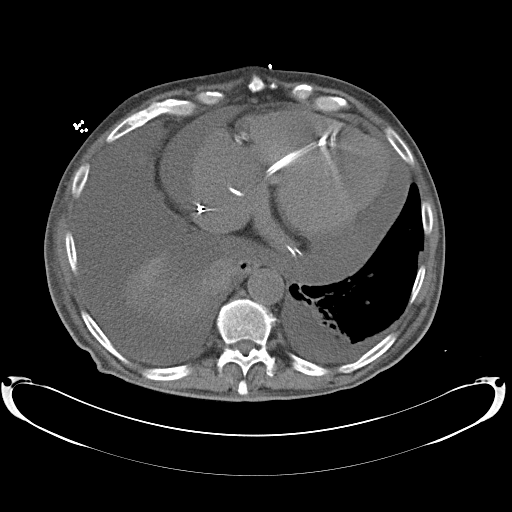
[im 84/129  bone]
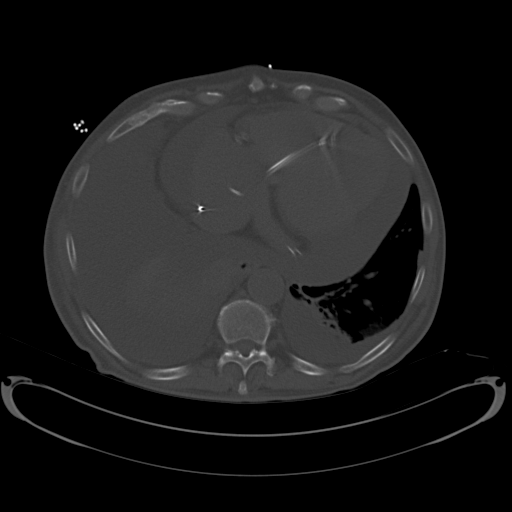
[im 90/129  soft-tissue]
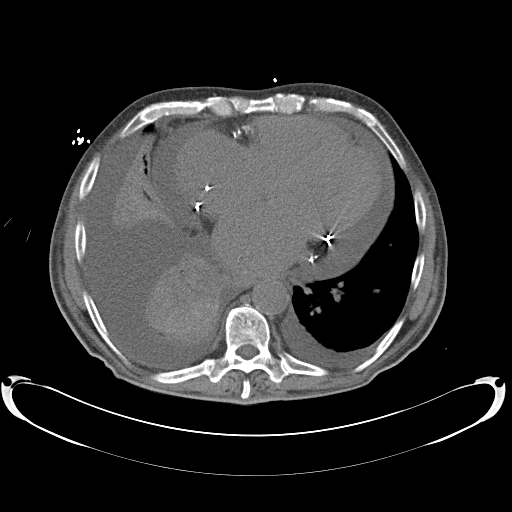
[im 103/129  soft-tissue]
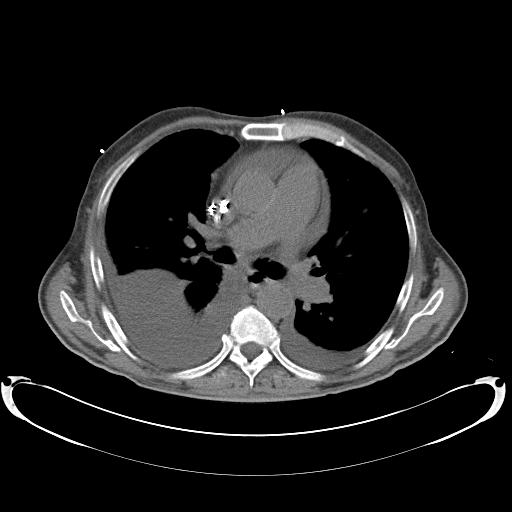
[im 109/129  soft-tissue]
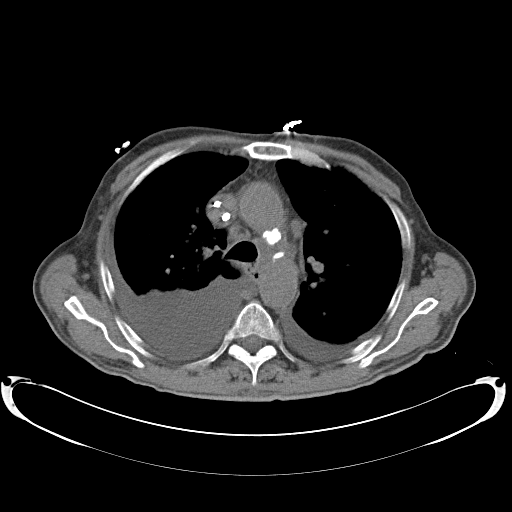
[im 122/129  soft-tissue]
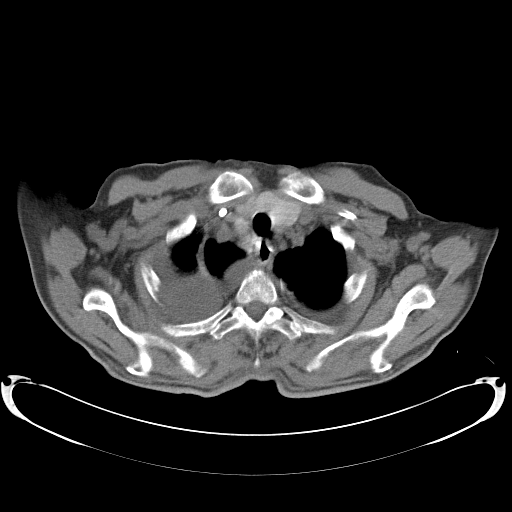

[Series 602: coronals · coronal · 1.26mm/px · 3 of 89 slices shown]
[im 30/89  soft-tissue]
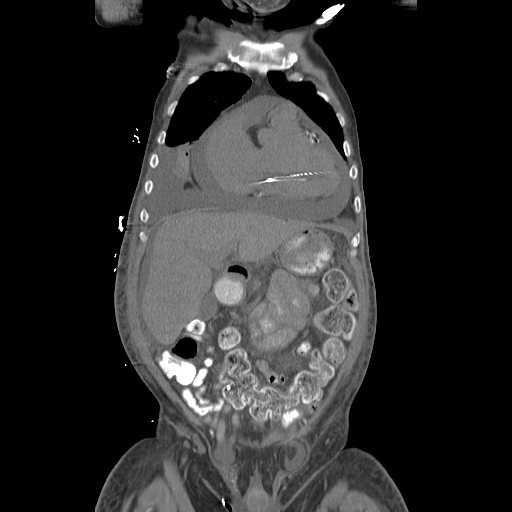
[im 40/89  soft-tissue]
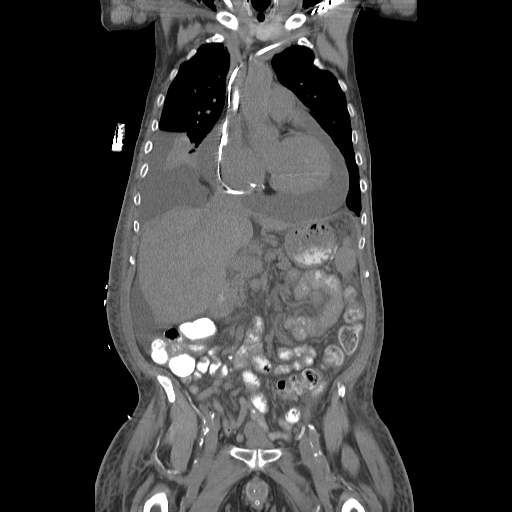
[im 49/89  soft-tissue]
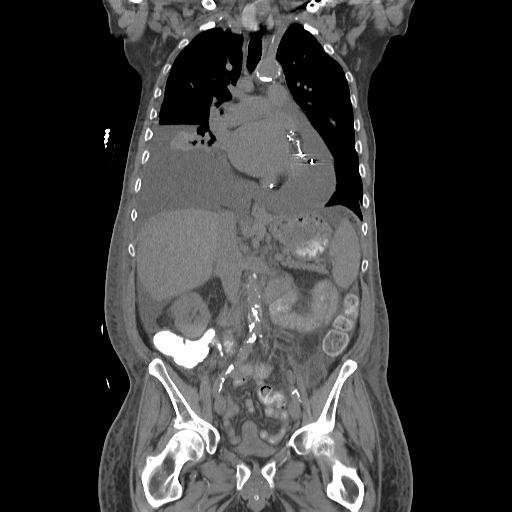

[16 of 46 positions shown; findings below may reference images not displayed]

FINDINGS: Large right pleural effusion and small to moderate left
pleural effusion.  Moderate pericardial effusion.  Cardiomegaly.
Pacer wires in place.  Diffuse coronary artery calcifications
present.  Aorta is normal caliber with calcifications in the aortic
arch and great vessels.  Compressive atelectasis in the lower lobes
bilaterally, right greater than left.  Small scattered mediastinal
lymph nodes, none pathologically enlarged. No mediastinal, hilar,
or axillary adenopathy.  Visualized thyroid and chest wall soft
tissues unremarkable.  No acute bony abnormality.
IMPRESSION: Large right pleural effusion.  Small moderate left pleural
effusion.  Compressive atelectasis bilaterally, right worse than
left.

Cardiomegaly.  Moderate pericardial effusion.

CT ABDOMEN AND PELVIS
FINDINGS: Mildly nodular contours to the liver suggests cirrhosis.
There is a small amount of perihepatic, perisplenic and cul-de-sac
free fluid.  Urinary bladder is decompressed with Foley catheter in
place.  Bilateral inguinal hernias containing fluid and fat.

Spleen is normal size.  No visible focal abnormality on this
unenhanced study.  Pancreas, adrenals and kidneys have an
unremarkable unenhanced appearance.  Small nonobstructing stones in
the mid and lower pole of the right kidney.  Vascular
calcifications throughout the aorta and branch vessels.  No
aneurysm.

Large and small bowel as well as stomach grossly unremarkable.
Mild edema throughout the mesentery and subcutaneous soft tissues
of the abdomen and pelvis.

No acute bony abnormality.  Degenerative disc disease at L5 S1.
IMPRESSION: Subtle nodularity to the liver contour suggests possibility of
cirrhosis.  Recommend clinical correlation.

Small amount of ascites.

Anasarca like stranding within the mesentery and subcutaneous soft
tissues.

Bilateral inguinal hernias containing fat and fluid.

## 2014-02-28 ENCOUNTER — Encounter (HOSPITAL_COMMUNITY): Payer: Self-pay | Admitting: Internal Medicine
